# Patient Record
Sex: Female | Born: 1965 | Race: Black or African American | Hispanic: No | Marital: Single | Smoking: Current every day smoker
Health system: Southern US, Community
[De-identification: ages and names within clinical notes are randomized; demographics above are authoritative.]

## PROBLEM LIST (undated history)

## (undated) ENCOUNTER — Emergency Department (HOSPITAL_BASED_OUTPATIENT_CLINIC_OR_DEPARTMENT_OTHER)

## (undated) DIAGNOSIS — N96 Recurrent pregnancy loss: Secondary | ICD-10-CM

## (undated) DIAGNOSIS — E119 Type 2 diabetes mellitus without complications: Secondary | ICD-10-CM

## (undated) DIAGNOSIS — G5600 Carpal tunnel syndrome, unspecified upper limb: Secondary | ICD-10-CM

## (undated) DIAGNOSIS — I10 Essential (primary) hypertension: Secondary | ICD-10-CM

## (undated) DIAGNOSIS — E785 Hyperlipidemia, unspecified: Secondary | ICD-10-CM

## (undated) DIAGNOSIS — R011 Cardiac murmur, unspecified: Secondary | ICD-10-CM

## (undated) DIAGNOSIS — F329 Major depressive disorder, single episode, unspecified: Secondary | ICD-10-CM

## (undated) DIAGNOSIS — J45909 Unspecified asthma, uncomplicated: Secondary | ICD-10-CM

## (undated) DIAGNOSIS — D649 Anemia, unspecified: Secondary | ICD-10-CM

## (undated) DIAGNOSIS — F419 Anxiety disorder, unspecified: Secondary | ICD-10-CM

## (undated) DIAGNOSIS — M1990 Unspecified osteoarthritis, unspecified site: Secondary | ICD-10-CM

## (undated) DIAGNOSIS — Z8639 Personal history of other endocrine, nutritional and metabolic disease: Secondary | ICD-10-CM

## (undated) DIAGNOSIS — M75100 Unspecified rotator cuff tear or rupture of unspecified shoulder, not specified as traumatic: Secondary | ICD-10-CM

## (undated) DIAGNOSIS — F32A Depression, unspecified: Secondary | ICD-10-CM

## (undated) DIAGNOSIS — G8929 Other chronic pain: Secondary | ICD-10-CM

## (undated) DIAGNOSIS — M549 Dorsalgia, unspecified: Secondary | ICD-10-CM

## (undated) HISTORY — DX: Recurrent pregnancy loss: N96

## (undated) HISTORY — DX: Cardiac murmur, unspecified: R01.1

## (undated) HISTORY — DX: Dorsalgia, unspecified: M54.9

## (undated) HISTORY — DX: Hyperlipidemia, unspecified: E78.5

## (undated) HISTORY — DX: Other chronic pain: G89.29

## (undated) HISTORY — DX: Type 2 diabetes mellitus without complications: E11.9

## (undated) HISTORY — PX: CARPAL TUNNEL RELEASE: SHX101

## (undated) HISTORY — DX: Essential (primary) hypertension: I10

## (undated) HISTORY — DX: Unspecified osteoarthritis, unspecified site: M19.90

## (undated) HISTORY — DX: Personal history of other endocrine, nutritional and metabolic disease: Z86.39

## (undated) HISTORY — DX: Depression, unspecified: F32.A

## (undated) HISTORY — PX: REDUCTION MAMMAPLASTY: SUR839

## (undated) HISTORY — DX: Major depressive disorder, single episode, unspecified: F32.9

## (undated) HISTORY — DX: Unspecified rotator cuff tear or rupture of unspecified shoulder, not specified as traumatic: M75.100

## (undated) HISTORY — DX: Anxiety disorder, unspecified: F41.9

## (undated) HISTORY — DX: Anemia, unspecified: D64.9

## (undated) HISTORY — DX: Carpal tunnel syndrome, unspecified upper limb: G56.00

---

## 1998-03-23 ENCOUNTER — Other Ambulatory Visit: Admission: RE | Admit: 1998-03-23 | Discharge: 1998-03-23 | Payer: Self-pay | Admitting: Obstetrics and Gynecology

## 1998-06-10 ENCOUNTER — Other Ambulatory Visit: Admission: RE | Admit: 1998-06-10 | Discharge: 1998-06-10 | Payer: Self-pay | Admitting: *Deleted

## 1998-06-11 HISTORY — PX: COSMETIC SURGERY: SHX468

## 1998-06-11 HISTORY — PX: BREAST SURGERY: SHX581

## 1998-06-11 HISTORY — PX: REDUCTION MAMMAPLASTY: SUR839

## 1999-01-17 ENCOUNTER — Other Ambulatory Visit: Admission: RE | Admit: 1999-01-17 | Discharge: 1999-01-17 | Payer: Self-pay | Admitting: Obstetrics and Gynecology

## 1999-07-11 ENCOUNTER — Other Ambulatory Visit: Admission: RE | Admit: 1999-07-11 | Discharge: 1999-07-11 | Payer: Self-pay | Admitting: Obstetrics and Gynecology

## 1999-08-18 ENCOUNTER — Ambulatory Visit (HOSPITAL_COMMUNITY): Admission: RE | Admit: 1999-08-18 | Discharge: 1999-08-18 | Payer: Self-pay | Admitting: Obstetrics and Gynecology

## 1999-08-18 ENCOUNTER — Encounter (INDEPENDENT_AMBULATORY_CARE_PROVIDER_SITE_OTHER): Payer: Self-pay

## 2000-01-23 ENCOUNTER — Emergency Department (HOSPITAL_COMMUNITY): Admission: EM | Admit: 2000-01-23 | Discharge: 2000-01-23 | Payer: Self-pay | Admitting: Emergency Medicine

## 2000-01-23 ENCOUNTER — Encounter: Payer: Self-pay | Admitting: Emergency Medicine

## 2000-02-28 ENCOUNTER — Other Ambulatory Visit: Admission: RE | Admit: 2000-02-28 | Discharge: 2000-02-28 | Payer: Self-pay | Admitting: Obstetrics and Gynecology

## 2000-02-29 ENCOUNTER — Encounter (INDEPENDENT_AMBULATORY_CARE_PROVIDER_SITE_OTHER): Payer: Self-pay | Admitting: Specialist

## 2000-02-29 ENCOUNTER — Other Ambulatory Visit: Admission: RE | Admit: 2000-02-29 | Discharge: 2000-02-29 | Payer: Self-pay | Admitting: Obstetrics and Gynecology

## 2000-08-26 ENCOUNTER — Other Ambulatory Visit: Admission: RE | Admit: 2000-08-26 | Discharge: 2000-08-26 | Payer: Self-pay | Admitting: *Deleted

## 2000-09-27 ENCOUNTER — Ambulatory Visit (HOSPITAL_COMMUNITY): Admission: RE | Admit: 2000-09-27 | Discharge: 2000-09-27 | Payer: Self-pay | Admitting: Obstetrics and Gynecology

## 2000-09-27 ENCOUNTER — Encounter: Payer: Self-pay | Admitting: Obstetrics and Gynecology

## 2000-10-02 ENCOUNTER — Encounter (INDEPENDENT_AMBULATORY_CARE_PROVIDER_SITE_OTHER): Payer: Self-pay | Admitting: Specialist

## 2000-10-02 ENCOUNTER — Other Ambulatory Visit: Admission: RE | Admit: 2000-10-02 | Discharge: 2000-10-02 | Payer: Self-pay | Admitting: Plastic Surgery

## 2000-12-30 ENCOUNTER — Other Ambulatory Visit: Admission: RE | Admit: 2000-12-30 | Discharge: 2000-12-30 | Payer: Self-pay | Admitting: Obstetrics and Gynecology

## 2001-08-28 ENCOUNTER — Other Ambulatory Visit: Admission: RE | Admit: 2001-08-28 | Discharge: 2001-08-28 | Payer: Self-pay | Admitting: Obstetrics and Gynecology

## 2002-11-11 ENCOUNTER — Other Ambulatory Visit: Admission: RE | Admit: 2002-11-11 | Discharge: 2002-11-11 | Payer: Self-pay | Admitting: Obstetrics and Gynecology

## 2003-06-18 ENCOUNTER — Other Ambulatory Visit: Admission: RE | Admit: 2003-06-18 | Discharge: 2003-06-18 | Payer: Self-pay | Admitting: Obstetrics and Gynecology

## 2003-07-11 ENCOUNTER — Encounter (INDEPENDENT_AMBULATORY_CARE_PROVIDER_SITE_OTHER): Payer: Self-pay | Admitting: Specialist

## 2003-07-11 ENCOUNTER — Ambulatory Visit (HOSPITAL_COMMUNITY): Admission: AD | Admit: 2003-07-11 | Discharge: 2003-07-11 | Payer: Self-pay | Admitting: Obstetrics and Gynecology

## 2003-07-11 ENCOUNTER — Other Ambulatory Visit: Admission: RE | Admit: 2003-07-11 | Discharge: 2003-07-11 | Payer: Self-pay | Admitting: Obstetrics and Gynecology

## 2005-01-26 ENCOUNTER — Inpatient Hospital Stay (HOSPITAL_COMMUNITY): Admission: AD | Admit: 2005-01-26 | Discharge: 2005-01-26 | Payer: Self-pay | Admitting: Obstetrics and Gynecology

## 2005-02-14 ENCOUNTER — Encounter (INDEPENDENT_AMBULATORY_CARE_PROVIDER_SITE_OTHER): Payer: Self-pay | Admitting: Specialist

## 2005-02-14 ENCOUNTER — Ambulatory Visit (HOSPITAL_COMMUNITY): Admission: RE | Admit: 2005-02-14 | Discharge: 2005-02-14 | Payer: Self-pay | Admitting: Obstetrics and Gynecology

## 2005-03-11 ENCOUNTER — Emergency Department (HOSPITAL_COMMUNITY): Admission: EM | Admit: 2005-03-11 | Discharge: 2005-03-11 | Payer: Self-pay | Admitting: Family Medicine

## 2005-07-17 ENCOUNTER — Ambulatory Visit (HOSPITAL_COMMUNITY): Admission: RE | Admit: 2005-07-17 | Discharge: 2005-07-17 | Payer: Self-pay | Admitting: Obstetrics and Gynecology

## 2005-10-17 ENCOUNTER — Emergency Department (HOSPITAL_COMMUNITY): Admission: EM | Admit: 2005-10-17 | Discharge: 2005-10-17 | Payer: Self-pay | Admitting: Family Medicine

## 2005-11-06 ENCOUNTER — Encounter: Admission: RE | Admit: 2005-11-06 | Discharge: 2005-11-06 | Payer: Self-pay | Admitting: Orthopedic Surgery

## 2006-10-25 ENCOUNTER — Ambulatory Visit (HOSPITAL_COMMUNITY): Admission: RE | Admit: 2006-10-25 | Discharge: 2006-10-25 | Payer: Self-pay | Admitting: Obstetrics and Gynecology

## 2006-11-01 ENCOUNTER — Encounter: Admission: RE | Admit: 2006-11-01 | Discharge: 2006-11-01 | Payer: Self-pay | Admitting: Obstetrics and Gynecology

## 2007-01-13 ENCOUNTER — Ambulatory Visit (HOSPITAL_COMMUNITY): Payer: Self-pay | Admitting: Psychiatry

## 2007-02-18 ENCOUNTER — Ambulatory Visit (HOSPITAL_COMMUNITY): Payer: Self-pay | Admitting: Psychiatry

## 2007-03-24 ENCOUNTER — Ambulatory Visit (HOSPITAL_COMMUNITY): Payer: Self-pay | Admitting: Psychiatry

## 2007-06-16 ENCOUNTER — Emergency Department (HOSPITAL_COMMUNITY): Admission: EM | Admit: 2007-06-16 | Discharge: 2007-06-16 | Payer: Self-pay | Admitting: Emergency Medicine

## 2008-05-17 ENCOUNTER — Encounter: Admission: RE | Admit: 2008-05-17 | Discharge: 2008-05-17 | Payer: Self-pay | Admitting: Obstetrics and Gynecology

## 2008-06-15 ENCOUNTER — Emergency Department (HOSPITAL_COMMUNITY): Admission: EM | Admit: 2008-06-15 | Discharge: 2008-06-15 | Payer: Self-pay | Admitting: Family Medicine

## 2009-06-01 ENCOUNTER — Encounter: Admission: RE | Admit: 2009-06-01 | Discharge: 2009-06-01 | Payer: Self-pay | Admitting: Obstetrics and Gynecology

## 2009-06-21 ENCOUNTER — Encounter: Admission: RE | Admit: 2009-06-21 | Discharge: 2009-06-21 | Payer: Self-pay | Admitting: Obstetrics and Gynecology

## 2009-09-27 ENCOUNTER — Emergency Department (HOSPITAL_COMMUNITY): Admission: EM | Admit: 2009-09-27 | Discharge: 2009-09-27 | Payer: Self-pay | Admitting: Family Medicine

## 2010-07-02 ENCOUNTER — Encounter: Payer: Self-pay | Admitting: Orthopedic Surgery

## 2010-07-03 ENCOUNTER — Encounter
Admission: RE | Admit: 2010-07-03 | Discharge: 2010-07-03 | Payer: Self-pay | Source: Home / Self Care | Attending: Obstetrics and Gynecology | Admitting: Obstetrics and Gynecology

## 2010-09-25 LAB — URINE CULTURE: Colony Count: >=100000

## 2010-09-25 LAB — POCT URINALYSIS DIP (DEVICE)
Bilirubin Urine: NEGATIVE
Specific Gravity, Urine: >=1.03 (ref 1.005–1.030)
pH: 5.5 (ref 5.0–8.0)

## 2010-10-27 NOTE — H&P (Signed)
NAME:  Melody Jenkins, Melody Jenkins                         ACCOUNT NO.:  [id number]   MEDICAL RECORD NO.:  [id number]                   PATIENT TYPE:  AMB   LOCATION:  SDC                                  FACILITY:  [facility]   PHYSICIAN:  Maxie Better, M.D.            DATE OF BIRTH:  01/24/1966   DATE OF ADMISSION:  07/12/2003  DATE OF DISCHARGE:                                HISTORY & PHYSICAL   CHIEF COMPLAINT:  1. Missed abortion.  2. Vaginal bleeding.   HISTORY OF PRESENT ILLNESS:  This is a 45 year old, gravida 4, para 1-0-1-1,  black female, last menstrual period April 12, 2003, [facility] of January 18, 2004,  with a diagnosis of a missed abortion, who now presents for surgical  management.  The patient has been having some vaginal bleeding and mild  cramps.  She desires chromosomal analysis.  The patient was diagnosed with a  missed AB on July 01, 2003, after she presented to [facility]  for a genetic evaluation.   ALLERGIES:  No known drug allergies.   MEDICATIONS:  Prenatal vitamins.   PAST MEDICAL HISTORY:  1. Uterine fibroids.  2. Smoker.   PAST SURGICAL HISTORY:  1. D&C x 2.  2. Hysteroscopy.  3. C-section.  4. Exploratory laparotomy in 1987.  5. Bilateral breast reduction in 2002.  6. Facial surgery in 1984 and 1998.   OBSTETRICAL HISTORY:  1. Ectopic pregnancy in June of 1987.  2. SAB in 1990.  3. In November of 1989, C-section of a 9 pound 11 ounce breech.   FAMILY HISTORY:  Noncontributory.   SOCIAL HISTORY:  Involved.  Smoker.  One child.   REVIEW OF SYSTEMS:  Negative.   PHYSICAL EXAMINATION:  GENERAL APPEARANCE:  A well-developed, well-  nourished, tearful, black female.  VITAL SIGNS:  Blood pressure 130/60, afebrile.  SKIN:  No lesions.  HEENT:  Anicteric sclerae.  Pink conjunctivae.  Oropharynx negative.  HEART:  Regular rate and rhythm without murmur.  LUNGS:  Clear to auscultation.  BREASTS:  Status post breast reduction with multiple scars.   No palpable  masses.  ABDOMEN:  Soft and nontender.  Transverse scar.  BACK:  No CVA tenderness.  PELVIC:  The vulva showed no lesions.  The vagina showed no discharge.  The  cervix was long and closed.  The uterus was anteverted, globular, and 9  weeks size.  No palpable mass.   IMPRESSION:  Missed abortion.   PLAN:  Admission.  Suction, dilatation, and evacuation with chromosomal  analysis.  The risks and benefits of the procedure have been explained to  the patient, including, but not limited to infection, bleeding, injury to  surrounding organ structures, uterine perforation and its management, and  retained products of conception and its management.  All questions were  answered.  The patient's blood type is O positive.  Maxie Better, M.D.    /MEDQ  D:  07/11/2003  T:  07/11/2003  Job:  [id number]

## 2010-10-27 NOTE — Op Note (Signed)
NAME:  Melody Jenkins, Melody Jenkins               ACCOUNT NO.:  [id number]   MEDICAL RECORD NO.:  [id number]          PATIENT TYPE:  AMB   LOCATION:  SDC                           [facility]ILITY:  [facility]   PHYSICIAN:  Maxie Better, M.D.DATE OF BIRTH:  [birth year]   DATE OF PROCEDURE:  02/14/2005  DATE OF DISCHARGE:                                 OPERATIVE REPORT   PREOPERATIVE DIAGNOSES:  Elective termination, fibroid uterus.   POSTOPERATIVE DIAGNOSES:  Elective termination, fibroid uterus.   PROCEDURE:  Suction dilation and evacuation.   ANESTHESIA:  MAC paracervical block.   SURGEON:  Maxie Better, M.D.   DESCRIPTION OF PROCEDURE:  Under adequate monitored anesthesia, the patient  was placed in the dorsal lithotomy position, she was sterilely prepped and  draped in the usual fashion. Examination under anesthesia revealed  anteverted 8 week size uterus, slightly irregular, no adnexal masses could  be appreciated. The bladder was catheterized of a moderate amount of  concentrated urine. A bivalve speculum was placed in the vagina, 20 mL of 1%  Nesacaine was injected at 3 and 9 o'clock. The anterior lip of the cervix  was grasped with a single tooth tenaculum. The cervix was then serially  dilated up to #31 [facility] dilator. A #8 mm curved suction cannula was  introduced into the uterine cavity. A large amount of products of conception  was obtained, the cavity was then suctioned and curetted until all tissue  was feltto have been removed at which time all instruments were then removed  from the vagina. Specimen labeled products of conception was sent to  pathology. Estimated blood loss was minimal. Complications none. The patient  tolerated the procedure well and was transferred to the recovery room in  stable condition.      Maxie Better, M.D.  Electronically Signed     [place]/MEDQ  D:  02/14/2005  T:  02/14/2005  Job:  [id number]

## 2010-10-27 NOTE — Op Note (Signed)
NAME:  Melody Jenkins, Melody Jenkins                         ACCOUNT NO.:  [id number]   MEDICAL RECORD NO.:  [id number]                   PATIENT TYPE:  MAT   LOCATION:  MATC                                 [facility]ILITY:  [facility]   PHYSICIAN:  Maxie Better, M.D.            DATE OF BIRTH:  1965/08/22   DATE OF PROCEDURE:  07/11/2003  DATE OF DISCHARGE:                                 OPERATIVE REPORT   PREOPERATIVE DIAGNOSES:  1. Missed abortion.  2. Vaginal bleeding.   PROCEDURE:  Suction dilation and evacuation.   POSTOPERATIVE DIAGNOSES:  1. Missed abortion.  2. Vaginal bleeding.   ANESTHESIA:  MAC, paracervical block.   SURGEON:  Maxie Better, M.D.   INDICATION:  This is a 45 year old, gravida 4, para 1-0-2-1 female with a  known missed abortion who presented with increased vaginal bleeding and  increasing cramps, now for her surgical procedure which had originally been  scheduled for July 12, 2003.  Risks and benefits of the procedure had  been explained to the patient; consent was signed, and the patient was  transferred to the operating room.  The patient had a temperature of 100.5.  She was given __________ prior to transfer to the operating room.   DESCRIPTION OF PROCEDURE:  Under adequate monitored anesthesia, the patient  was placed in the dorsal lithotomy position.  Examination under anesthesia  revealed an anteverted, 10-11 week size uterus.  No adnexal masses could be  appreciated.  The vagina was filled with clotted material.  The patient was  sterilely prepped and draped in the usual fashion.  The bladder was  catheterized but no urine.  A bivalve speculum was placed in the vagina.  Dark, clotted blood was noted which was removed.  Nesacaine 1% 20 mL was  injected paracervically at 3 and 9 o'clock.  The anterior lip of the cervix  was grasped with a single-tooth tenaculum.  The cervix was about 1 cm  dilated.  The cervix easily accepted a #27 [employer] dilator and a #8  suction  cannula was introduced in the uterine cavity.  Large amount of products of  conception was obtained; cavity was suctioned, curetted, suctioned, and  during this time of suction, a piece of tissue of the placenta was obtained  which was sent for karyotype.  The procedure was continued until all tissue  was felt to have been removed from the uterus at which time all instruments  were then removed from the vagina.  Specimen labeled products of conception  and karyotype was sent to pathology.  Estimated blood loss was less than 50  mL.  Complications none.  Maternal blood type was O positive.  The patient  tolerated the procedure well, was transferred to recovery in stable  condition.  Maxie Better, M.D.    [place]/MEDQ  D:  07/11/2003  T:  07/11/2003  Job:  [id number]

## 2010-10-27 NOTE — Op Note (Signed)
[facility]  Patient:    Melody Jenkins, Melody Jenkins                      MRN: [id number] Proc. Date: 08/18/99 Adm. Date:  [id number] Disc. Date: [id number] Attending:  Maxie Better                           Operative Report  PREOPERATIVE DIAGNOSIS:       Menorrhagia, ASCUS Pap smear.  POSTOPERATIVE DIAGNOSIS:      Menorrhagia, ASCUS Pap smear.  OPERATION:                    Dilatation and curettage, diagnostic hysteroscopy.  SURGEON:                      Sheronette A. Cherly Hensen, M.D.  ASSISTANT:  ANESTHESIA:                   General anesthesia.  ESTIMATED BLOOD LOSS:  INDICATIONS:                  This is a 45 year old female, last menstrual period of August 09, 1999, with menorrhagia who had undergone an ultrasound in November of 1999 which did not show a thickening endometrium, and who now presents with complaint of persistent heavy menses with each cycle.  At the time of her ultrasound, the uterus had appeared to be inhomogeneous suggestive of possible fibroids.  The patient now presents for further evaluation.  The risks and benefits of the procedure have been explained.  Consent was signed.  The patient was taken to the operating room.  DESCRIPTION OF PROCEDURE:     Under adequate general anesthesia, the patient was placed in the dorsal lithotomy position.  She was sterilely prepped and draped n the usual fashion.  The bladder was catheterized for a moderate amount of urine. Examination under anesthesia revealed an anteverted uterus about seven weeks size. No adnexal masses palpable.  A bivalve speculum was placed in the vagina.  A single tooth tenaculum was placed on the anterior lip of the cervix.  The endocervical  canal was curetted with a Duncan curet.  The specimen labled endocervical curettings was sent to pathology.  The cervix was then serially dilated up to #25 [facility] dilator and a Sorbitol primed diagnostic hysteroscope  was introduced into the uterine cavity and a video camera was attached to it.  Panoramic view of the uterine cavity was notable for both tubal ostia being seen.  There was an anterior wall thickening of the endometrium, but no identifiable fibroid or polyp.  The endocervical canal was inspected.  No polyps were seen.  The hysteroscope was removed.  The uterine cavity was then curetted for a moderate amount of tissue.  The hysteroscope was reinserted.  The uterine cavity was inspected.  The thickened area had markedly decreased.  The hysteroscope was then removed.  The uterine cavity was once again curetted.  The procedure was then terminated by removing ll instruments.  Specimens were endometrial curettings as well as endocervical curettings sent to pathology.  Estimated blood loss was minimal.  Complications  were none.  Sorbitol deficit was 140 cc.  The patient tolerated the procedure well and was transferred to the recovery room in stable condition. DD:  08/18/99 TD:  08/20/99 Job: [id number]

## 2010-12-23 ENCOUNTER — Inpatient Hospital Stay (HOSPITAL_COMMUNITY): Admission: RE | Admit: 2010-12-23 | Payer: 59 | Source: Ambulatory Visit

## 2013-06-05 ENCOUNTER — Encounter (HOSPITAL_COMMUNITY): Payer: Self-pay | Admitting: Emergency Medicine

## 2013-06-05 ENCOUNTER — Emergency Department (HOSPITAL_COMMUNITY)
Admission: EM | Admit: 2013-06-05 | Discharge: 2013-06-05 | Disposition: A | Discharge status: Home / Self Care | Payer: 59 | Attending: Emergency Medicine | Admitting: Emergency Medicine

## 2013-06-05 DIAGNOSIS — K089 Disorder of teeth and supporting structures, unspecified: Secondary | ICD-10-CM | POA: Insufficient documentation

## 2013-06-05 DIAGNOSIS — J45901 Unspecified asthma with (acute) exacerbation: Secondary | ICD-10-CM | POA: Insufficient documentation

## 2013-06-05 DIAGNOSIS — K0889 Other specified disorders of teeth and supporting structures: Secondary | ICD-10-CM

## 2013-06-05 DIAGNOSIS — Z79899 Other long term (current) drug therapy: Secondary | ICD-10-CM | POA: Insufficient documentation

## 2013-06-05 DIAGNOSIS — F17200 Nicotine dependence, unspecified, uncomplicated: Secondary | ICD-10-CM | POA: Insufficient documentation

## 2013-06-05 HISTORY — DX: Unspecified asthma, uncomplicated: J45.909

## 2013-06-05 MED ORDER — ALBUTEROL SULFATE HFA 108 (90 BASE) MCG/ACT IN AERS
2.0000 | INHALATION_SPRAY | Freq: Once | RESPIRATORY_TRACT | Status: AC
  Administered 2013-06-05: 2 via RESPIRATORY_TRACT
  Filled 2013-06-05: qty 6.7

## 2013-06-05 MED ORDER — OXYCODONE-ACETAMINOPHEN 5-325 MG PO TABS
1.0000 | ORAL_TABLET | Freq: Once | ORAL | Status: AC
  Administered 2013-06-05: 1 via ORAL
  Filled 2013-06-05: qty 1

## 2013-06-05 MED ORDER — ALBUTEROL SULFATE (5 MG/ML) 0.5% IN NEBU
5.0000 mg | INHALATION_SOLUTION | Freq: Once | RESPIRATORY_TRACT | Status: AC
  Administered 2013-06-05: 5 mg via RESPIRATORY_TRACT
  Filled 2013-06-05: qty 1

## 2013-06-05 MED ORDER — KETOROLAC TROMETHAMINE 60 MG/2ML IM SOLN
60.0000 mg | Freq: Once | INTRAMUSCULAR | Status: AC
  Administered 2013-06-05: 60 mg via INTRAMUSCULAR
  Filled 2013-06-05: qty 2

## 2013-06-05 MED ORDER — PENICILLIN V POTASSIUM 500 MG PO TABS: 500.0000 mg | ORAL_TABLET | Freq: Four times a day (QID) | ORAL | Status: AC

## 2013-06-05 MED ORDER — OXYCODONE-ACETAMINOPHEN 5-325 MG PO TABS: 2.0000 | ORAL_TABLET | ORAL | Status: DC | PRN

## 2013-06-05 MED ORDER — LIDOCAINE VISCOUS 2 % MT SOLN
15.0000 mL | Freq: Once | OROMUCOSAL | Status: AC
  Administered 2013-06-05: 15 mL via OROMUCOSAL
  Filled 2013-06-05: qty 15

## 2013-06-05 NOTE — ED Notes (Addendum)
Per pt sts cough, wheezing, SOB and dental pain. sts she has been taking BC powder and ibuprofen for the pain in her mouth.

## 2013-06-05 NOTE — ED Provider Notes (Signed)
CSN: [id number]     Arrival date & time 06/05/13  1611 History  This chart was scribed for non-physician practitioner Irish Elders, NP, working with Audree Camel, MD by Dorothey Baseman, ED Scribe. This patient was seen in room TR07C/TR07C and the patient's care was started at 8:11 PM.    Chief Complaint  Patient presents with  . Dental Pain  . Shortness of Breath   The history is provided by the patient. No language interpreter was used.   HPI Comments: Melody Jenkins is a 47 y.o. female with a history of asthma who presents to the Emergency Department complaining of a constant, dry cough with associated wheezes and shortness of breath onset earlier today. Patient reports using her albuterol inhaler at home without relief. Patient reports that she has run out of her inhaler. She reports that she was seen at an urgent care facility for these complaints and states she was advised to come to the ED. Patient received an albuterol nebulizer and Percocet upon arrival to the ED, which she states has provided some relief. She denies palpitations.   Patient is also complaining of a constant, throbbing pain, 10/10 at its worst, to the right, upper dentition onset a little over 1 week ago. She reports applying BC powders and Orajel directly to the affected tooth and taking Excedrin and 2 bottles of ibuprofen at home without significant relief. She reports an associated episode emesis and epistaxis earlier today secondary to taking too much medication, both of which has since resolved. Patient reports that her dentist is currently out of town, but that she will call to make an appointment next week. She denies any allergies to medications. Patient has no other pertinent medical history.   Past Medical History  Diagnosis Date  . Asthma    History reviewed. No pertinent past surgical history. History reviewed. No pertinent family history. History  Substance Use Topics  . Smoking status: Current Every Day  Smoker  . Smokeless tobacco: Not on file  . Alcohol Use: No   OB History   Grav Para Term Preterm Abortions TAB SAB Ect Mult Living                 Review of Systems  HENT: Positive for dental problem and nosebleeds (resolved).   Respiratory: Positive for cough, shortness of breath and wheezing.   Cardiovascular: Negative for palpitations.  Gastrointestinal: Positive for vomiting (resolved).  All other systems reviewed and are negative.    Allergies  Review of patient's allergies indicates no known allergies.  Home Medications   Current Outpatient Rx  Name  Route  Sig  Dispense  Refill  . albuterol (PROVENTIL HFA;VENTOLIN HFA) 108 (90 BASE) MCG/ACT inhaler   Inhalation   Inhale 2 puffs into the lungs every 6 (six) hours as needed for wheezing or shortness of breath.         . Aspirin-Salicylamide-Caffeine (BC HEADACHE POWDER PO)   Oral   Take 1 packet by mouth every 6 (six) hours as needed (pain).         Marland Kitchen ibuprofen (ADVIL,MOTRIN) 400 MG tablet   Oral   Take 600 mg by mouth every 6 (six) hours as needed for moderate pain.         . vitamin C (ASCORBIC ACID) 500 MG tablet   Oral   Take 500 mg by mouth daily.         . Zinc 30 MG CAPS   Oral   Take  30 mg by mouth daily.          Triage Vitals: BP 154/112  Pulse 80  Temp(Src) 97.3 F (36.3 C)  Resp 18  SpO2 100%  LMP 05/06/2013  Physical Exam  Nursing note and vitals reviewed. Constitutional: She is oriented to person, place, and time. She appears well-developed and well-nourished. No distress.  HENT:  Head: Normocephalic and atraumatic.  Mouth/Throat: Oropharynx is clear and moist.    Cracked dentition. Mild gingival erythema.   Eyes: Conjunctivae are normal.  Neck: Normal range of motion. Neck supple.  Cardiovascular: Normal rate, regular rhythm and normal heart sounds.   Pulmonary/Chest: Effort normal and breath sounds normal. No respiratory distress. She has no wheezes.  Abdominal: She  exhibits no distension.  Musculoskeletal: Normal range of motion.  Lymphadenopathy:    She has no cervical adenopathy.  Neurological: She is alert and oriented to person, place, and time.  Skin: Skin is warm and dry.  Psychiatric: She has a normal mood and affect. Her behavior is normal.    ED Course  Procedures (including critical care time)  DIAGNOSTIC STUDIES: Oxygen Saturation is 100% on room air, normal by my interpretation.    COORDINATION OF CARE: 8:16 PM- Ordered an albuterol nebulizer and Percocet. Will order Toradol and an additional albuterol inhaler. Will discharge patient with Veetid, Percocet, and viscous lidocaine to manage symptoms. Advised patient to take Benadryl at home. Advised patient to follow up with her dentist. Discussed treatment plan with patient at bedside and patient verbalized agreement.     Labs Review Labs Reviewed - No data to display Imaging Review No results found.  EKG Interpretation   None       MDM   1. Pain, dental    Feeling better after Toradol injection here. Nebulizer treatment x 1 here with resolution of symptoms. Wheezing cleared. Afebrile. No difficulty swallowing or neck pain. Albuterol inhaler given to take home. Lidocaine, viscous given as well as, Pen VK and percocet for pain. Instructions given for medications and discussed follow-up with dentist. Resource guide given.   I personally performed the services described in this documentation, which was scribed in my presence. The recorded information has been reviewed and is accurate.     Irish Elders, NP 06/20/13 1511

## 2013-06-23 NOTE — ED Provider Notes (Signed)
Medical screening examination/treatment/procedure(s) were performed by non-physician practitioner and as supervising physician I was immediately available for consultation/collaboration.  EKG Interpretation   None         Cheree Fowles T Amaan Meyer, MD 06/23/13 0053 

## 2014-08-27 ENCOUNTER — Other Ambulatory Visit: Payer: Self-pay

## 2014-08-27 DIAGNOSIS — Z1231 Encounter for screening mammogram for malignant neoplasm of breast: Secondary | ICD-10-CM

## 2014-09-03 ENCOUNTER — Ambulatory Visit
Admission: RE | Admit: 2014-09-03 | Discharge: 2014-09-03 | Disposition: A | Discharge status: Home / Self Care | Payer: 59 | Source: Ambulatory Visit

## 2014-09-03 DIAGNOSIS — Z1231 Encounter for screening mammogram for malignant neoplasm of breast: Secondary | ICD-10-CM

## 2015-03-30 ENCOUNTER — Encounter: Payer: Self-pay | Admitting: Family Medicine

## 2015-03-30 DIAGNOSIS — F419 Anxiety disorder, unspecified: Secondary | ICD-10-CM | POA: Insufficient documentation

## 2015-03-30 DIAGNOSIS — E785 Hyperlipidemia, unspecified: Secondary | ICD-10-CM | POA: Insufficient documentation

## 2015-03-30 DIAGNOSIS — F329 Major depressive disorder, single episode, unspecified: Secondary | ICD-10-CM | POA: Insufficient documentation

## 2015-03-30 DIAGNOSIS — F32A Depression, unspecified: Secondary | ICD-10-CM | POA: Insufficient documentation

## 2015-03-30 DIAGNOSIS — D649 Anemia, unspecified: Secondary | ICD-10-CM | POA: Insufficient documentation

## 2015-03-30 DIAGNOSIS — J45909 Unspecified asthma, uncomplicated: Secondary | ICD-10-CM | POA: Insufficient documentation

## 2015-04-06 ENCOUNTER — Ambulatory Visit (INDEPENDENT_AMBULATORY_CARE_PROVIDER_SITE_OTHER): Payer: 59 | Admitting: Family Medicine

## 2015-04-06 ENCOUNTER — Encounter: Payer: Self-pay | Admitting: Family Medicine

## 2015-04-06 VITALS — BP 134/78 | HR 72 | Temp 98.3°F | Resp 16 | Ht 69.0 in | Wt 232.0 lb

## 2015-04-06 DIAGNOSIS — J4520 Mild intermittent asthma, uncomplicated: Secondary | ICD-10-CM

## 2015-04-06 DIAGNOSIS — Z0000 Encounter for general adult medical examination without abnormal findings: Secondary | ICD-10-CM | POA: Diagnosis not present

## 2015-04-06 DIAGNOSIS — Z113 Encounter for screening for infections with a predominantly sexual mode of transmission: Secondary | ICD-10-CM

## 2015-04-06 DIAGNOSIS — Z124 Encounter for screening for malignant neoplasm of cervix: Secondary | ICD-10-CM

## 2015-04-06 DIAGNOSIS — Z720 Tobacco use: Secondary | ICD-10-CM | POA: Diagnosis not present

## 2015-04-06 DIAGNOSIS — Z1159 Encounter for screening for other viral diseases: Secondary | ICD-10-CM | POA: Diagnosis not present

## 2015-04-06 DIAGNOSIS — E669 Obesity, unspecified: Secondary | ICD-10-CM | POA: Insufficient documentation

## 2015-04-06 DIAGNOSIS — Z8249 Family history of ischemic heart disease and other diseases of the circulatory system: Secondary | ICD-10-CM

## 2015-04-06 DIAGNOSIS — S46911A Strain of unspecified muscle, fascia and tendon at shoulder and upper arm level, right arm, initial encounter: Secondary | ICD-10-CM | POA: Diagnosis not present

## 2015-04-06 DIAGNOSIS — Z23 Encounter for immunization: Secondary | ICD-10-CM

## 2015-04-06 LAB — COMPREHENSIVE METABOLIC PANEL
ALK PHOS: 69 U/L (ref 33–130)
ALT: 11 U/L (ref 6–29)
AST: 14 U/L (ref 10–35)
Albumin: 3.9 g/dL (ref 3.6–5.1)
BUN: 12 mg/dL (ref 7–25)
CALCIUM: 9.4 mg/dL (ref 8.6–10.4)
CO2: 27 mmol/L (ref 20–31)
Chloride: 105 mmol/L (ref 98–110)
Creat: 0.72 mg/dL (ref 0.50–1.05)
Glucose, Bld: 108 mg/dL — ABNORMAL HIGH (ref 70–99)
POTASSIUM: 4.3 mmol/L (ref 3.5–5.3)
Sodium: 139 mmol/L (ref 135–146)
Total Bilirubin: 0.4 mg/dL (ref 0.2–1.2)
Total Protein: 7.2 g/dL (ref 6.1–8.1)

## 2015-04-06 LAB — CBC WITH DIFFERENTIAL/PLATELET
BASOS PCT: 0 % (ref 0–1)
Basophils Absolute: 0 10*3/uL (ref 0.0–0.1)
EOS PCT: 2 % (ref 0–5)
Eosinophils Absolute: 0.1 10*3/uL (ref 0.0–0.7)
HEMATOCRIT: 42.6 % (ref 36.0–46.0)
Hemoglobin: 13.1 g/dL (ref 12.0–15.0)
LYMPHS ABS: 2.4 10*3/uL (ref 0.7–4.0)
Lymphocytes Relative: 36 % (ref 12–46)
MCH: 25.2 pg — ABNORMAL LOW (ref 26.0–34.0)
MCHC: 30.8 g/dL (ref 30.0–36.0)
MCV: 81.9 fL (ref 78.0–100.0)
MONOS PCT: 10 % (ref 3–12)
MPV: 10.2 fL (ref 8.6–12.4)
Monocytes Absolute: 0.7 10*3/uL (ref 0.1–1.0)
Neutro Abs: 3.5 10*3/uL (ref 1.7–7.7)
Neutrophils Relative %: 52 % (ref 43–77)
Platelets: 315 10*3/uL (ref 150–400)
RBC: 5.2 MIL/uL — ABNORMAL HIGH (ref 3.87–5.11)
RDW: 16 % — ABNORMAL HIGH (ref 11.5–15.5)
WBC: 6.8 10*3/uL (ref 4.0–10.5)

## 2015-04-06 LAB — WET PREP FOR TRICH, YEAST, CLUE
Trich, Wet Prep: NONE SEEN
YEAST WET PREP: NONE SEEN

## 2015-04-06 LAB — LIPID PANEL
CHOL/HDL RATIO: 5.6 ratio — AB (ref <=5.0)
Cholesterol: 173 mg/dL (ref 125–200)
HDL: 31 mg/dL — AB (ref >=46)
LDL CALC: 113 mg/dL (ref <130)
Triglycerides: 147 mg/dL (ref <150)
VLDL: 29 mg/dL (ref <30)

## 2015-04-06 MED ORDER — MELOXICAM 7.5 MG PO TABS: 7.5000 mg | ORAL_TABLET | Freq: Every day | ORAL | Status: DC
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
## 2015-04-06 NOTE — Assessment & Plan Note (Signed)
Continue albuterol,. Discussed need for tobacco cessation Pneumonia vaccine 23 given

## 2015-04-06 NOTE — Patient Instructions (Addendum)
Referral for colonoscopy We will call with lab results Take mobic with food Pneumovax 23 given F/U pending results

## 2015-04-07 LAB — TSH: TSH: 0.504 u[IU]/mL (ref 0.350–4.500)

## 2015-04-07 LAB — GC/CHLAMYDIA PROBE AMP
CT Probe RNA: NEGATIVE
GC Probe RNA: NEGATIVE

## 2015-04-07 LAB — PAP THINPREP ASCUS RFLX HPV RFLX TYPE

## 2015-04-07 LAB — HIV ANTIBODY (ROUTINE TESTING W REFLEX): HIV 1&2 Ab, 4th Generation: NONREACTIVE

## 2015-04-07 LAB — RPR

## 2015-04-07 LAB — HEPATITIS C ANTIBODY: HCV Ab: NEGATIVE

## 2015-08-10 ENCOUNTER — Other Ambulatory Visit: Payer: Self-pay

## 2015-08-10 DIAGNOSIS — Z1231 Encounter for screening mammogram for malignant neoplasm of breast: Secondary | ICD-10-CM

## 2015-08-15 ENCOUNTER — Ambulatory Visit: Payer: 59 | Admitting: Family Medicine

## 2015-08-16 ENCOUNTER — Ambulatory Visit (INDEPENDENT_AMBULATORY_CARE_PROVIDER_SITE_OTHER): Payer: 59 | Admitting: Family Medicine

## 2015-08-16 ENCOUNTER — Encounter: Payer: Self-pay | Admitting: Family Medicine

## 2015-08-16 VITALS — BP 138/82 | HR 72 | Temp 98.5°F | Resp 16 | Ht 69.0 in | Wt 226.0 lb

## 2015-08-16 DIAGNOSIS — N12 Tubulo-interstitial nephritis, not specified as acute or chronic: Secondary | ICD-10-CM

## 2015-08-16 DIAGNOSIS — N760 Acute vaginitis: Secondary | ICD-10-CM | POA: Diagnosis not present

## 2015-08-16 DIAGNOSIS — Z1211 Encounter for screening for malignant neoplasm of colon: Secondary | ICD-10-CM

## 2015-08-16 LAB — URINALYSIS, ROUTINE W REFLEX MICROSCOPIC
BILIRUBIN URINE: NEGATIVE
Glucose, UA: NEGATIVE
Leukocytes, UA: NEGATIVE
Nitrite: POSITIVE — AB
PH: 5.5 (ref 5.0–8.0)
Protein, ur: NEGATIVE

## 2015-08-16 LAB — URINALYSIS, MICROSCOPIC ONLY
CASTS: NONE SEEN [LPF]
CRYSTALS: NONE SEEN [HPF]
Yeast: NONE SEEN [HPF]

## 2015-08-16 LAB — WET PREP FOR TRICH, YEAST, CLUE
Trich, Wet Prep: NONE SEEN
YEAST WET PREP: NONE SEEN

## 2015-08-16 MED ORDER — CIPROFLOXACIN HCL 500 MG PO TABS: 500.0000 mg | ORAL_TABLET | Freq: Two times a day (BID) | ORAL | Status: DC

## 2015-08-16 MED ORDER — FLUCONAZOLE 150 MG PO TABS: 150.0000 mg | ORAL_TABLET | Freq: Once | ORAL | Status: DC

## 2015-08-16 MED ORDER — HYDROCODONE-ACETAMINOPHEN 5-325 MG PO TABS: 1.0000 | ORAL_TABLET | Freq: Four times a day (QID) | ORAL | Status: DC | PRN

## 2015-08-16 NOTE — Progress Notes (Signed)
Patient ID: ALEXXYS WALSTROM, female   DOB: 1965-06-20, 50 y.o.   MRN: [id number]   Subjective:    Patient ID: CLORINE MOHABIR, female    DOB: [birth year], 50 y.o.   MRN: [id number]  Patient presents for Urinary Frequency and Vaginitis  patient here with right flank pain and urinary pressure lower abdominal pain that started the past few days. She states that occasionally she gets flank pain on and off. She is noted that she has a lot of urgency when going to the restroom and at times she will dribble. She has not had any fever. She also notes some mild vaginal discharge. She's not had any abnormal vaginal bleeding. No fever no nausea vomiting no change in bowels    Review Of Systems:  GEN- denies fatigue, fever, weight loss,weakness, recent illness HEENT- denies eye drainage, change in vision, nasal discharge, CVS- denies chest pain, palpitations RESP- denies SOB, cough, wheeze ABD- denies N/V, change in stools, abd pain GU- denies dysuria, hematuria, dribbling, incontinence MSK- denies joint pain, muscle aches, injury Neuro- denies headache, dizziness, syncope, seizure activity       Objective:    BP 138/82 mmHg  Pulse 72  Temp(Src) 98.5 F (36.9 C) (Oral)  Resp 16  Ht 5\' 9"  (1.753 m)  Wt 226 lb (102.513 kg)  BMI 33.36 kg/m2  LMP 08/08/2015 GEN- NAD, alert and oriented x3 HEENT- PERRL, EOMI, non injected sclera, pink conjunctiva, MMM, oropharynx clear CVS- RRR, no murmur RESP-CTAB ABD-NABS,soft,TTP suprapubic region, no rebound, no guarding,ND, +CVA tenderness Right side          Assessment & Plan:      Problem List Items Addressed This Visit    None    Visit Diagnoses    Pyelonephritis    -  Primary     concern for mild pyelonephritis I think this can be treated outpatient. I will put her on ciprofloxacin of also give her hydrocodone she will return for repeat UA in a few weeks    Relevant Orders    Urinalysis, Routine w reflex microscopic (not at [facility]) (Completed)     Urine culture    Vaginitis and vulvovaginitis         no vaginitis seen on wet prep    Relevant Orders    WET PREP FOR [place], YEAST, CLUE (Completed)    Colon cancer screening        Relevant Orders    Ambulatory referral to Gastroenterology       Note: This dictation was prepared with Dragon dictation along with smaller phrase technology. Any transcriptional errors that result from this process are unintentional.

## 2015-08-16 NOTE — Patient Instructions (Addendum)
Cancel Physical for March-  Take antibiotics as prescribed  Difllucan given  Referral to GI for colonoscopy  F/U Lab to leave urine sample 4 weeks

## 2015-08-17 ENCOUNTER — Encounter: Payer: Self-pay | Admitting: Family Medicine

## 2015-08-19 ENCOUNTER — Emergency Department (HOSPITAL_COMMUNITY)
Admission: EM | Admit: 2015-08-19 | Discharge: 2015-08-20 | Disposition: A | Discharge status: Home / Self Care | Payer: 59 | Attending: Emergency Medicine | Admitting: Emergency Medicine

## 2015-08-19 ENCOUNTER — Encounter (HOSPITAL_COMMUNITY): Payer: Self-pay | Admitting: Emergency Medicine

## 2015-08-19 DIAGNOSIS — R1031 Right lower quadrant pain: Secondary | ICD-10-CM | POA: Diagnosis present

## 2015-08-19 DIAGNOSIS — R112 Nausea with vomiting, unspecified: Secondary | ICD-10-CM | POA: Insufficient documentation

## 2015-08-19 DIAGNOSIS — F17210 Nicotine dependence, cigarettes, uncomplicated: Secondary | ICD-10-CM | POA: Insufficient documentation

## 2015-08-19 DIAGNOSIS — J45909 Unspecified asthma, uncomplicated: Secondary | ICD-10-CM | POA: Diagnosis not present

## 2015-08-19 LAB — URINE MICROSCOPIC-ADD ON

## 2015-08-19 LAB — CBC
HCT: 38.2 % (ref 36.0–46.0)
Hemoglobin: 11.6 g/dL — ABNORMAL LOW (ref 12.0–15.0)
MCH: 24 pg — AB (ref 26.0–34.0)
MCHC: 30.4 g/dL (ref 30.0–36.0)
MCV: 78.9 fL (ref 78.0–100.0)
PLATELETS: 296 10*3/uL (ref 150–400)
RBC: 4.84 MIL/uL (ref 3.87–5.11)
RDW: 16.3 % — AB (ref 11.5–15.5)
WBC: 6.7 10*3/uL (ref 4.0–10.5)

## 2015-08-19 LAB — URINALYSIS, ROUTINE W REFLEX MICROSCOPIC
BILIRUBIN URINE: NEGATIVE
GLUCOSE, UA: NEGATIVE mg/dL
Ketones, ur: NEGATIVE mg/dL
Leukocytes, UA: NEGATIVE
Nitrite: NEGATIVE
PROTEIN: NEGATIVE mg/dL
Specific Gravity, Urine: 1.025 (ref 1.005–1.030)
pH: 5 (ref 5.0–8.0)

## 2015-08-19 LAB — COMPREHENSIVE METABOLIC PANEL
ALBUMIN: 3.5 g/dL (ref 3.5–5.0)
ALK PHOS: 64 U/L (ref 38–126)
ALT: 14 U/L (ref 14–54)
AST: 17 U/L (ref 15–41)
Anion gap: 10 (ref 5–15)
BUN: 12 mg/dL (ref 6–20)
CALCIUM: 9.1 mg/dL (ref 8.9–10.3)
CHLORIDE: 107 mmol/L (ref 101–111)
CO2: 23 mmol/L (ref 22–32)
CREATININE: 0.8 mg/dL (ref 0.44–1.00)
GFR calc non Af Amer: >60 mL/min (ref >60)
GLUCOSE: 117 mg/dL — AB (ref 65–99)
Potassium: 3.8 mmol/L (ref 3.5–5.1)
SODIUM: 140 mmol/L (ref 135–145)
Total Bilirubin: 0.2 mg/dL — ABNORMAL LOW (ref 0.3–1.2)
Total Protein: 7 g/dL (ref 6.5–8.1)

## 2015-08-19 LAB — URINE CULTURE: Colony Count: >=100000

## 2015-08-19 NOTE — ED Notes (Signed)
Pt. reports RLQ pain onset this morning with nausea and emesis , denies fever  or diarrhea , currently taking oral antibiotic diagnosed with UTI 2 days ago .

## 2015-08-19 NOTE — ED Notes (Signed)
Pt got upset over wait time and stated that she didn't understand why she was still waiting when she was here before people that had already gone back. I apologized and stated "Im sorry I wasn't up here when you checked in and I go by the time and unfortunately don't have control over the time". She said "Don't say that to me when I don't feel good". I apologized again and told her that there was 1 more patient in front of her to go back. The name of the pt in front of her got called to go back then the pt and her husband got up and left.

## 2015-09-02 ENCOUNTER — Encounter: Payer: 59 | Admitting: Family Medicine

## 2015-09-11 ENCOUNTER — Other Ambulatory Visit: Payer: Self-pay | Admitting: Family Medicine

## 2015-09-12 ENCOUNTER — Ambulatory Visit: Payer: 59

## 2015-09-12 NOTE — Telephone Encounter (Signed)
Refill appropriate and filled per protocol. 

## 2015-11-03 ENCOUNTER — Ambulatory Visit: Payer: 59

## 2015-12-02 ENCOUNTER — Encounter: Payer: Self-pay | Admitting: Family Medicine

## 2015-12-07 ENCOUNTER — Emergency Department (HOSPITAL_COMMUNITY)
Admission: EM | Admit: 2015-12-07 | Discharge: 2015-12-07 | Disposition: A | Discharge status: Home / Self Care | Payer: 59 | Attending: Emergency Medicine | Admitting: Emergency Medicine

## 2015-12-07 ENCOUNTER — Emergency Department (HOSPITAL_COMMUNITY): Payer: 59

## 2015-12-07 ENCOUNTER — Encounter (HOSPITAL_COMMUNITY): Payer: Self-pay

## 2015-12-07 DIAGNOSIS — J45909 Unspecified asthma, uncomplicated: Secondary | ICD-10-CM | POA: Insufficient documentation

## 2015-12-07 DIAGNOSIS — Y939 Activity, unspecified: Secondary | ICD-10-CM | POA: Insufficient documentation

## 2015-12-07 DIAGNOSIS — F17210 Nicotine dependence, cigarettes, uncomplicated: Secondary | ICD-10-CM | POA: Diagnosis not present

## 2015-12-07 DIAGNOSIS — Y92512 Supermarket, store or market as the place of occurrence of the external cause: Secondary | ICD-10-CM | POA: Insufficient documentation

## 2015-12-07 DIAGNOSIS — T148XXA Other injury of unspecified body region, initial encounter: Secondary | ICD-10-CM

## 2015-12-07 DIAGNOSIS — W19XXXA Unspecified fall, initial encounter: Secondary | ICD-10-CM

## 2015-12-07 DIAGNOSIS — W010XXA Fall on same level from slipping, tripping and stumbling without subsequent striking against object, initial encounter: Secondary | ICD-10-CM | POA: Insufficient documentation

## 2015-12-07 DIAGNOSIS — M13862 Other specified arthritis, left knee: Secondary | ICD-10-CM | POA: Insufficient documentation

## 2015-12-07 DIAGNOSIS — M62830 Muscle spasm of back: Secondary | ICD-10-CM

## 2015-12-07 DIAGNOSIS — M545 Low back pain, unspecified: Secondary | ICD-10-CM

## 2015-12-07 DIAGNOSIS — Y999 Unspecified external cause status: Secondary | ICD-10-CM | POA: Insufficient documentation

## 2015-12-07 DIAGNOSIS — S8992XA Unspecified injury of left lower leg, initial encounter: Secondary | ICD-10-CM | POA: Diagnosis present

## 2015-12-07 DIAGNOSIS — S8392XA Sprain of unspecified site of left knee, initial encounter: Secondary | ICD-10-CM | POA: Diagnosis not present

## 2015-12-07 DIAGNOSIS — I10 Essential (primary) hypertension: Secondary | ICD-10-CM

## 2015-12-07 DIAGNOSIS — M1712 Unilateral primary osteoarthritis, left knee: Secondary | ICD-10-CM

## 2015-12-07 MED ORDER — HYDROCODONE-ACETAMINOPHEN 5-325 MG PO TABS: 1.0000 | ORAL_TABLET | Freq: Four times a day (QID) | ORAL | Status: DC | PRN

## 2015-12-07 MED ORDER — NAPROXEN 500 MG PO TABS: 500.0000 mg | ORAL_TABLET | Freq: Two times a day (BID) | ORAL | Status: DC | PRN

## 2015-12-07 MED ORDER — HYDROCODONE-ACETAMINOPHEN 5-325 MG PO TABS
1.0000 | ORAL_TABLET | Freq: Once | ORAL | Status: AC
  Administered 2015-12-07: 1 via ORAL
  Filled 2015-12-07: qty 1

## 2015-12-07 NOTE — ED Notes (Signed)
Mercedes, PA at bedside at this time.

## 2015-12-07 NOTE — ED Provider Notes (Signed)
CSN: [id number]     Arrival date & time 12/07/15  1817 History  By signing my name below, I, Melody Jenkins, attest that this documentation has been prepared under the direction and in the presence of Bing Duffey Camprubi-Soms, PA-C. Electronically Signed: [name], ED Scribe. 12/07/2015. 8:13 PM.     No chief complaint on file.  Patient is a 50 y.o. female presenting with knee pain. The history is provided by the patient and medical records. No language interpreter was used.  Knee Pain Location:  Knee Time since incident:  2 hours Injury: no   Knee location:  L knee Pain details:    Quality:  Throbbing   Radiates to:  L leg   Severity:  Moderate   Onset quality:  Sudden   Duration:  2 hours   Timing:  Constant   Progression:  Unchanged Chronicity:  New Dislocation: no   Prior injury to area:  No Relieved by:  None tried Worsened by:  Bearing weight and activity Ineffective treatments:  None tried Associated symptoms: back pain, decreased ROM (due to pain), stiffness and swelling   Associated symptoms: no muscle weakness, no numbness and no tingling     HPI Comments: Melody Jenkins is a 50 y.o. female who presents to the Emergency Department complaining of a mechanical fall that occurred 2 hours ago. She reports that she slipped on a wet surface and landed on her right side with her left leg folding behind her. She reports associated 7/10, intermittent, throbbing, left knee pain which radiates through her left leg and worsens with knee bending and ambulation. She additionally reports associated mild left knee swelling, limited ROM due to pain, as well as moderate right lower back/torso pain and right shoulder/trapezius pain. Her back and shoulder pain worsen with movement. Pt denies having taken anything for pain management. Pt has no known drug allergies. She denies LOC, head trauma, bruising, abrasions, weakness, numbness, tingling, bowel or bladder incontinence, saddle anesthesia  or cauda equina symptoms, CP, SOB, abd pain and n/v, or any other symptoms. Of note, chart review reveals she has had elevated BPs at all prior visits despite not having a documented dx of HTN. States that she's been told the bottom number is high in the past, but hasn't ever taken meds for it.    Past Medical History  Diagnosis Date  . Anemia   . Anxiety   . Asthma   . Depression   . Hyperlipidemia   . History of multiple miscarriages    Past Surgical History  Procedure Laterality Date  . Cosmetic surgery  2000    breast reduction  . Breast surgery  2000    reduction   Family History  Problem Relation Age of Onset  . Diabetes Mother   . Hypertension Mother   . Hypertension Father   . Heart disease Father   . Early death Brother   . Kidney disease Maternal Grandmother   . Heart disease Brother    Social History  Substance Use Topics  . Smoking status: Current Every Day Smoker -- 0.00 packs/day    Types: Cigarettes  . Smokeless tobacco: Never Used  . Alcohol Use: 2.4 oz/week    4 Glasses of wine per week   OB History    No data available     Review of Systems  HENT: Negative for facial swelling (no head inj).   Respiratory: Negative for shortness of breath.   Cardiovascular: Negative for chest pain.  Gastrointestinal:  Negative for nausea, vomiting and abdominal pain.  Genitourinary: Negative for difficulty urinating (no incontinence).  Musculoskeletal: Positive for myalgias (entire R side of body), back pain, joint swelling, arthralgias (L knee) and stiffness.  Skin: Negative for color change and wound.  Allergic/Immunologic: Negative for immunocompromised state.  Neurological: Negative for syncope, weakness and numbness.  Psychiatric/Behavioral: Negative for confusion.  10 Systems reviewed and are negative for acute change except as noted in the HPI.   Allergies  Review of patient's allergies indicates no known allergies.  Home Medications   Prior to  Admission medications   Medication Sig Start Date End Date Taking? Authorizing Provider  albuterol (PROVENTIL HFA;VENTOLIN HFA) 108 (90 BASE) MCG/ACT inhaler Inhale 2 puffs into the lungs every 6 (six) hours as needed for wheezing or shortness of breath.    Historical Provider, MD  ciprofloxacin (CIPRO) 500 MG tablet Take 1 tablet (500 mg total) by mouth 2 (two) times daily. 08/16/15   Alycia Rossetti, MD  fluconazole (DIFLUCAN) 150 MG tablet Take 1 tablet (150 mg total) by mouth once. 08/16/15   Alycia Rossetti, MD  HYDROcodone-acetaminophen (NORCO) 5-325 MG tablet Take 1 tablet by mouth every 6 (six) hours as needed for moderate pain. 08/16/15   Alycia Rossetti, MD  ibuprofen (ADVIL,MOTRIN) 400 MG tablet Take 600 mg by mouth every 6 (six) hours as needed for moderate pain.    Historical Provider, MD  meloxicam (MOBIC) 7.5 MG tablet TAKE 1 TABLET EVERY DAY 09/12/15   Alycia Rossetti, MD  vitamin C (ASCORBIC ACID) 500 MG tablet Take 500 mg by mouth daily.    Historical Provider, MD  Vitamin D, Cholecalciferol, 1000 UNITS CAPS Take by mouth.    Historical Provider, MD   BP 189/116 mmHg  Pulse 73  Temp(Src) 97.8 F (36.6 C) (Oral)  Resp 18  Wt 225 lb (102.059 kg)  SpO2 100%    Physical Exam  Constitutional: She is oriented to person, place, and time. Vital signs are normal. She appears well-developed and well-nourished.  Non-toxic appearance. No distress.  Afebrile, nontoxic, NAD, with elevated BP noted which is similar to prior visits  HENT:  Head: Normocephalic and atraumatic.  Mouth/Throat: Mucous membranes are normal.  [place]/AT, no scalp tenderness or crepitus  Eyes: Conjunctivae and EOM are normal. Right eye exhibits no discharge. Left eye exhibits no discharge.  Neck: Normal range of motion. Neck supple. Muscular tenderness present. No spinous process tenderness present. No rigidity. Normal range of motion present.  FROM intact without spinous process TTP, no bony stepoffs or deformities,  with mild right sided trapezius and paraspinous muscle TTP and muscle spasms. No rigidity or meningeal signs. No bruising or swelling.   Cardiovascular: Normal rate and intact distal pulses.   Pulmonary/Chest: Effort normal. No respiratory distress. She exhibits no tenderness, no crepitus, no deformity and no retraction.  No chest wall TTP or bruising   Abdominal: Soft. Normal appearance. She exhibits no distension. There is no tenderness. There is no rigidity, no rebound and no guarding.  Soft, NTND, no r/g/r, no bruising  Musculoskeletal:       Left knee: She exhibits decreased range of motion (due to pain) and swelling. She exhibits no ecchymosis, no deformity, no laceration, no erythema, normal alignment, no LCL laxity, normal patellar mobility and no MCL laxity. Tenderness found. Medial joint line and lateral joint line tenderness noted.  Diffuse right sided paraspinous muscle TTP and spasms throughout all spinal levels without focal midline bony TTP or  step offs, no pelvic instability or hip joint line TTP. No bruising or abrasions to the back or right torso.  Left knee with limited ROM due to pain, with moderate joint line TTP,  +swelling/effusion, no deformity, no bruising or erythema, no warmth, no abnormal alignment or patellar mobility, no varus/valgus laxity, neg anterior drawer test, no crepitus. No other extremity tenderness or evidence of trauma. Right shoulder with FROM intact and no focal bony or joint line TTP.  Strength and sensation grossly intact, distal pulses intact, compartments soft  Neurological: She is alert and oriented to person, place, and time. She has normal strength. No sensory deficit. Gait normal. GCS eye subscore is 4. GCS verbal subscore is 5. GCS motor subscore is 6.  Skin: Skin is warm, dry and intact. No abrasion, no bruising and no rash noted.  No bruising or abrasions, no seatbelt sign  Psychiatric: She has a normal mood and affect. Her behavior is normal.   Nursing note and vitals reviewed.   ED Course  Procedures  DIAGNOSTIC STUDIES: Oxygen Saturation is 100% on RA, normal by my interpretation.    COORDINATION OF CARE: 7:41 PM Discussed next steps with pt. Pt verbalized understanding and is agreeable with the plan.   Labs Review Labs Reviewed - No data to display  Imaging Review Dg Knee Complete 4 Views Left  12/07/2015  CLINICAL DATA:  50 year old female with fall and left knee injury. EXAM: LEFT KNEE - COMPLETE 4+ VIEW COMPARISON:  None. FINDINGS: There is no acute fracture or dislocation. The bones are mildly osteopenic. There is mild osteoarthritic changes with narrowing of the medial and lateral compartments. No significant joint effusion. The soft tissues appear unremarkable. IMPRESSION: No acute fracture or dislocation. Electronically Signed   By: Anner Crete M.D.   On: 12/07/2015 19:03   I have personally reviewed and evaluated these images as part of my medical decision-making.   EKG Interpretation None      MDM   Final diagnoses:  Fall, initial encounter  Left knee sprain, initial encounter  Arthritis of left knee  HTN (hypertension), benign  Contusion  Right-sided low back pain without sciatica  Back muscle spasm    50 y.o. female here with mechanical fall after slipping on water in a store, twisted L knee and fell on R torso. Extremities NVI with soft compartments, L knee swelling and tenderness in joint line. No other focal bony TTP, although diffuse paraspinous muscle TTP on the R side in all spinal levels, no midline spinal TTP or cauda equina symptoms. Xray of knee showing mild OA, no acute injury. Likely sprain. Doubt need for imaging of other areas, likely just contusion/strain. RICE discussed, naprosyn/norco for pain, f/up with ortho in 1-2wks for recheck of knee pain, and f/up with PCP in 1wk for recheck. Also noted to have HTN but this has been noted before, asymptomatic today, will have her f/up with  PCP. I explained the diagnosis and have given explicit precautions to return to the ER including for any other new or worsening symptoms. The patient understands and accepts the medical plan as it's been dictated and I have answered their questions. Discharge instructions concerning home care and prescriptions have been given. The patient is STABLE and is discharged to home in good condition.   I personally performed the services described in this documentation, which was scribed in my presence. The recorded information has been reviewed and is accurate.  BP 189/97 mmHg  Pulse 63  Temp(Src) 97.8  F (36.6 C) (Oral)  Resp 20  Wt 102.059 kg  SpO2 99%  Meds ordered this encounter  Medications  . HYDROcodone-acetaminophen (NORCO/VICODIN) 5-325 MG per tablet 1 tablet    Sig:   . naproxen (NAPROSYN) 500 MG tablet    Sig: Take 1 tablet (500 mg total) by mouth 2 (two) times daily as needed for mild pain, moderate pain or headache (TAKE WITH MEALS.).    Dispense:  20 tablet    Refill:  0    Order Specific Question:  Supervising Provider    Answer:  MILLER, BRIAN [[id number]]  . HYDROcodone-acetaminophen (NORCO) 5-325 MG tablet    Sig: Take 1 tablet by mouth every 6 (six) hours as needed for severe pain.    Dispense:  10 tablet    Refill:  0    Order Specific Question:  Supervising Provider    Answer:  Noemi Chapel [[id number]]      Atasha Colebank Camprubi-Soms, PA-C 12/07/15 [id number]  Blanchie Dessert, MD 12/09/15 [id number]

## 2015-12-07 NOTE — Discharge Instructions (Signed)
Wear knee sleeve for at least 2 weeks for stabilization of knee. Use crutches as needed for comfort. Ice and elevate knee throughout the day, using ice pack for no more than 20 minutes every hour. Use heat to the other areas of soreness, no more than 20 minutes per hour. Alternate between naprosyn and norco for pain relief. Do not drive or operate machinery with pain medication use. Call orthopedic follow up today or tomorrow to schedule followup appointment for recheck of ongoing knee pain in 1-2 weeks that can be canceled with a 24-48 hour notice if complete resolution of pain. Follow up with your regular doctor in 1 week for ongoing management of your blood pressure, and to recheck symptoms. Return to the ER for changes or worsening symptoms.    Musculoskeletal Pain Musculoskeletal pain is muscle and boney aches and pains. These pains can occur in any part of the body. Your caregiver may treat you without knowing the cause of the pain. They may treat you if blood or urine tests, X-rays, and other tests were normal.  CAUSES There is often not a definite cause or reason for these pains. These pains may be caused by a type of germ (virus). The discomfort may also come from overuse. Overuse includes working out too hard when your body is not fit. Boney aches also come from weather changes. Bone is sensitive to atmospheric pressure changes. HOME CARE INSTRUCTIONS   Ask when your test results will be ready. Make sure you get your test results.  Only take over-the-counter or prescription medicines for pain, discomfort, or fever as directed by your caregiver. If you were given medications for your condition, do not drive, operate machinery or power tools, or sign legal documents for 24 hours. Do not drink alcohol. Do not take sleeping pills or other medications that may interfere with treatment.  Continue all activities unless the activities cause more pain. When the pain lessens, slowly resume normal  activities. Gradually increase the intensity and duration of the activities or exercise.  During periods of severe pain, bed rest may be helpful. Lay or sit in any position that is comfortable.  Putting ice on the injured area.  Put ice in a bag.  Place a towel between your skin and the bag.  Leave the ice on for 15 to 20 minutes, 3 to 4 times a day.  Follow up with your caregiver for continued problems and no reason can be found for the pain. If the pain becomes worse or does not go away, it may be necessary to repeat tests or do additional testing. Your caregiver may need to look further for a possible cause. SEEK IMMEDIATE MEDICAL CARE IF:  You have pain that is getting worse and is not relieved by medications.  You develop chest pain that is associated with shortness or breath, sweating, feeling sick to your stomach (nauseous), or throw up (vomit).  Your pain becomes localized to the abdomen.  You develop any new symptoms that seem different or that concern you. MAKE SURE YOU:   Understand these instructions.  Will watch your condition.  Will get help right away if you are not doing well or get worse.   This information is not intended to replace advice given to you by your health care provider. Make sure you discuss any questions you have with your health care provider.   Document Released: 05/28/2005 Document Revised: 08/20/2011 Document Reviewed: 01/30/2013 Elsevier Interactive Patient Education 2016 [employer]  Muscle Cramps  and Spasms Muscle cramps and spasms are when muscles tighten by themselves. They usually get better within minutes. Muscle cramps are painful. They are usually stronger and last longer than muscle spasms. Muscle spasms may or may not be painful. They can last a few seconds or much longer. HOME CARE  Drink enough fluid to keep your pee (urine) clear or pale yellow.  Massage, stretch, and relax the muscle.  Use a warm towel, heating pad, or  warm shower water on tight muscles.  Place ice on the muscle if it is tender or in pain.  Put ice in a plastic bag.  Place a towel between your skin and the bag.  Leave the ice on for 15-20 minutes, 03-04 times a day.  Only take medicine as told by your doctor. GET HELP RIGHT AWAY IF:  Your cramps or spasms get worse, happen more often, or do not get better with time. MAKE SURE YOU:  Understand these instructions.  Will watch your condition.  Will get help right away if you are not doing well or get worse.   This information is not intended to replace advice given to you by your health care provider. Make sure you discuss any questions you have with your health care provider.   Document Released: 05/10/2008 Document Revised: 09/22/2012 Document Reviewed: 05/14/2012 Elsevier Interactive Patient Education 2016 Else[employer] Inc.  Knee Sprain A knee sprain is a tear in the strong bands of tissue that connect the bones (ligaments) of your knee. HOME CARE  Raise (elevate) your injured knee to lessen puffiness (swelling).  To ease pain and puffiness, put ice on the injured area.  Put ice in a plastic bag.  Place a towel between your skin and the bag.  Leave the ice on for 20 minutes, 2-3 times a day.  Only take medicine as told by your doctor.  Do not leave your knee unprotected until pain and stiffness go away (usually 4-6 weeks).  If you have a cast or splint, do not get it wet. If your doctor told you to not take it off, cover it with a plastic bag when you shower or bathe. Do not swim.  Your doctor may have you do exercises to prevent or limit permanent weakness and stiffness. GET HELP RIGHT AWAY IF:   Your cast or splint becomes damaged.  Your pain gets worse.  You have a lot of pain, puffiness, or numbness below the cast or splint. MAKE SURE YOU:   Understand these instructions.  Will watch your condition.  Will get help right away if you are not doing well or  get worse.   This information is not intended to replace advice given to you by your health care provider. Make sure you discuss any questions you have with your health care provider.   Document Released: 05/16/2009 Document Revised: 06/02/2013 Document Reviewed: 02/03/2013 Elsevier Interactive Patient Education 2016 [place] therapy can help ease sore, stiff, injured, and tight muscles and joints. Heat relaxes your muscles, which may help ease your pain. Heat therapy should only be used on old, pre-existing, or long-lasting (chronic) injuries. Do not use heat therapy unless told by your doctor. HOW TO USE HEAT THERAPY There are several different kinds of heat therapy, including:  Moist heat pack.  Warm water bath.  Hot water bottle.  Electric heating pad.  Heated gel pack.  Heated wrap.  Electric heating pad. GENERAL HEAT THERAPY RECOMMENDATIONS   Do not sleep while  using heat therapy. Only use heat therapy while you are awake.  Your skin may turn pink while using heat therapy. Do not use heat therapy if your skin turns red.  Do not use heat therapy if you have new pain.  High heat or long exposure to heat can cause burns. Be careful when using heat therapy to avoid burning your skin.  Do not use heat therapy on areas of your skin that are already irritated, such as with a rash or sunburn. GET HELP IF:   You have blisters, redness, swelling (puffiness), or numbness.  You have new pain.  Your pain is worse. MAKE SURE YOU:  Understand these instructions.  Will watch your condition.  Will get help right away if you are not doing well or get worse.   This information is not intended to replace advice given to you by your health care provider. Make sure you discuss any questions you have with your health care provider.   Document Released: 08/20/2011 Document Revised: 06/18/2014 Document Reviewed: 07/21/2013 Elsevier Interactive Patient  Education 2016 [place].  Cryotherapy Cryotherapy means treatment with cold. Ice or gel packs can be used to reduce both pain and swelling. Ice is the most helpful within the first 24 to 48 hours after an injury or flare-up from overusing a muscle or joint. Sprains, strains, spasms, burning pain, shooting pain, and aches can all be eased with ice. Ice can also be used when recovering from surgery. Ice is effective, has very few side effects, and is safe for most people to use. PRECAUTIONS  Ice is not a safe treatment option for people with:  Raynaud phenomenon. This is a condition affecting small blood vessels in the extremities. Exposure to cold may cause your problems to return.  Cold hypersensitivity. There are many forms of cold hypersensitivity, including:  Cold urticaria. Red, itchy hives appear on the skin when the tissues begin to warm after being iced.  Cold erythema. This is a red, itchy rash caused by exposure to cold.  Cold hemoglobinuria. Red blood cells break down when the tissues begin to warm after being iced. The hemoglobin that carry oxygen are passed into the urine because they cannot combine with blood proteins fast enough.  Numbness or altered sensitivity in the area being iced. If you have any of the following conditions, do not use ice until you have discussed cryotherapy with your caregiver:  Heart conditions, such as arrhythmia, angina, or chronic heart disease.  High blood pressure.  Healing wounds or open skin in the area being iced.  Current infections.  Rheumatoid arthritis.  Poor circulation.  Diabetes. Ice slows the blood flow in the region it is applied. This is beneficial when trying to stop inflamed tissues from spreading irritating chemicals to surrounding tissues. However, if you expose your skin to cold temperatures for too long or without the proper protection, you can damage your skin or nerves. Watch for signs of skin damage due to  cold. HOME CARE INSTRUCTIONS Follow these tips to use ice and cold packs safely.  Place a dry or damp towel between the ice and skin. A damp towel will cool the skin more quickly, so you may need to shorten the time that the ice is used.  For a more rapid response, add gentle compression to the ice.  Ice for no more than 10 to 20 minutes at a time. The bonier the area you are icing, the less time it will take to get the  benefits of ice.  Check your skin after 5 minutes to make sure there are no signs of a poor response to cold or skin damage.  Rest 20 minutes or more between uses.  Once your skin is numb, you can end your treatment. You can test numbness by very lightly touching your skin. The touch should be so light that you do not see the skin dimple from the pressure of your fingertip. When using ice, most people will feel these normal sensations in this order: cold, burning, aching, and numbness.  Do not use ice on someone who cannot communicate their responses to pain, such as small children or people with dementia. HOW TO MAKE AN ICE PACK Ice packs are the most common way to use ice therapy. Other methods include ice massage, ice baths, and cryosprays. Muscle creams that cause a cold, tingly feeling do not offer the same benefits that ice offers and should not be used as a substitute unless recommended by your caregiver. To make an ice pack, do one of the following:  Place crushed ice or a bag of frozen vegetables in a sealable plastic bag. Squeeze out the excess air. Place this bag inside another plastic bag. Slide the bag into a pillowcase or place a damp towel between your skin and the bag.  Mix 3 parts water with 1 part rubbing alcohol. Freeze the mixture in a sealable plastic bag. When you remove the mixture from the freezer, it will be slushy. Squeeze out the excess air. Place this bag inside another plastic bag. Slide the bag into a pillowcase or place a damp towel between your  skin and the bag. SEEK MEDICAL CARE IF:  You develop white spots on your skin. This may give the skin a blotchy (mottled) appearance.  Your skin turns blue or pale.  Your skin becomes waxy or hard.  Your swelling gets worse. MAKE SURE YOU:   Understand these instructions.  Will watch your condition.  Will get help right away if you are not doing well or get worse.   This information is not intended to replace advice given to you by your health care provider. Make sure you discuss any questions you have with your health care provider.   Document Released: 01/22/2011 Document Revised: 06/18/2014 Document Reviewed: 01/22/2011 Elsevier Interactive Patient Education 2016 [employer].  Hypertension Hypertension is another name for high blood pressure. High blood pressure forces your heart to work harder to pump blood. A blood pressure reading has two numbers, which includes a higher number over a lower number (example: 110/72). HOME CARE   Have your blood pressure rechecked by your doctor.  Only take medicine as told by your doctor. Follow the directions carefully. The medicine does not work as well if you skip doses. Skipping doses also puts you at risk for problems.  Do not smoke.  Monitor your blood pressure at home as told by your doctor. GET HELP IF:  You think you are having a reaction to the medicine you are taking.  You have repeat headaches or feel dizzy.  You have puffiness (swelling) in your ankles.  You have trouble with your vision. GET HELP RIGHT AWAY IF:   You get a very bad headache and are confused.  You feel weak, numb, or faint.  You get chest or belly (abdominal) pain.  You throw up (vomit).  You cannot breathe very well. MAKE SURE YOU:   Understand these instructions.  Will watch your condition.  Will  get help right away if you are not doing well or get worse.   This information is not intended to replace advice given to you by your  health care provider. Make sure you discuss any questions you have with your health care provider.   Document Released: 11/14/2007 Document Revised: 06/02/2013 Document Reviewed: 03/20/2013 Elsevier Interactive Patient Education 2016 [place] Your High Blood Pressure Blood pressure is a measurement of how forceful your blood is pressing against the walls of the arteries. Arteries are muscular tubes within the circulatory system. Blood pressure does not stay the same. Blood pressure rises when you are active, excited, or nervous; and it lowers during sleep and relaxation. If the numbers measuring your blood pressure stay above normal most of the time, you are at risk for health problems. High blood pressure (hypertension) is a long-term (chronic) condition in which blood pressure is elevated. A blood pressure reading is recorded as two numbers, such as 120 over 80 (or 120/80). The first, higher number is called the systolic pressure. It is a measure of the pressure in your arteries as the heart beats. The second, lower number is called the diastolic pressure. It is a measure of the pressure in your arteries as the heart relaxes between beats.  Keeping your blood pressure in a normal range is important to your overall health and prevention of health problems, such as heart disease and stroke. When your blood pressure is uncontrolled, your heart has to work harder than normal. High blood pressure is a very common condition in adults because blood pressure tends to rise with age. Men and women are equally likely to have hypertension but at different times in life. Before age 59, men are more likely to have hypertension. After 51 years of age, women are more likely to have it. Hypertension is especially common in African Americans. This condition often has no signs or symptoms. The cause of the condition is usually not known. Your caregiver can help you come up with a plan to keep your blood  pressure in a normal, healthy range. BLOOD PRESSURE STAGES Blood pressure is classified into four stages: normal, prehypertension, stage 1, and stage 2. Your blood pressure reading will be used to determine what type of treatment, if any, is necessary. Appropriate treatment options are tied to these four stages:  Normal  Systolic pressure (mm Hg): below 120.  Diastolic pressure (mm Hg): below 80. Prehypertension  Systolic pressure (mm Hg): 120 to 139.  Diastolic pressure (mm Hg): 80 to 89. Stage1  Systolic pressure (mm Hg): 140 to 159.  Diastolic pressure (mm Hg): 90 to 99. Stage2  Systolic pressure (mm Hg): 160 or above.  Diastolic pressure (mm Hg): 100 or above. RISKS RELATED TO HIGH BLOOD PRESSURE Managing your blood pressure is an important responsibility. Uncontrolled high blood pressure can lead to:  A heart attack.  A stroke.  A weakened blood vessel (aneurysm).  Heart failure.  Kidney damage.  Eye damage.  Metabolic syndrome.  Memory and concentration problems. HOW TO MANAGE YOUR BLOOD PRESSURE Blood pressure can be managed effectively with lifestyle changes and medicines (if needed). Your caregiver will help you come up with a plan to bring your blood pressure within a normal range. Your plan should include the following: Education  Read all information provided by your caregivers about how to control blood pressure.  Educate yourself on the latest guidelines and treatment recommendations. New research is always being done to further define the risks  and treatments for high blood pressure. Lifestylechanges  Control your weight.  Avoid smoking.  Stay physically active.  Reduce the amount of salt in your diet.  Reduce stress.  Control any chronic conditions, such as high cholesterol or diabetes.  Reduce your alcohol intake. Medicines  Several medicines (antihypertensive medicines) are available, if needed, to bring blood pressure within a  normal range. Communication  Review all the medicines you take with your caregiver because there may be side effects or interactions.  Talk with your caregiver about your diet, exercise habits, and other lifestyle factors that may be contributing to high blood pressure.  See your caregiver regularly. Your caregiver can help you create and adjust your plan for managing high blood pressure. RECOMMENDATIONS FOR TREATMENT AND FOLLOW-UP  The following recommendations are based on current guidelines for managing high blood pressure in nonpregnant adults. Use these recommendations to identify the proper follow-up period or treatment option based on your blood pressure reading. You can discuss these options with your caregiver.  Systolic pressure of 123456 to [id number] or diastolic pressure of 80 to 89: Follow up with your caregiver as directed.  Systolic pressure of [id number] to 0000000 or diastolic pressure of 90 to 100: Follow up with your caregiver within 2 months.  Systolic pressure above 0000000 or diastolic pressure above [id number]: Follow up with your caregiver within 1 month.  Systolic pressure above 99991111 or diastolic pressure above [id number]: Consider antihypertensive therapy; follow up with your caregiver within 1 week.  Systolic pressure above [id number] or diastolic pressure above 123456: Begin antihypertensive therapy; follow up with your caregiver within 1 week.   This information is not intended to replace advice given to you by your health care provider. Make sure you discuss any questions you have with your health care provider.   Document Released: 02/20/2012 Document Reviewed: 02/20/2012 Elsevier Interactive Patient Education [employer].

## 2015-12-07 NOTE — ED Notes (Signed)
Patient fell while in store this afternoon and her left leg rolled under her. Complains of left knee, right shoulder, and buttock pain. No loc

## 2015-12-12 ENCOUNTER — Encounter: Payer: Self-pay | Admitting: Family Medicine

## 2015-12-12 ENCOUNTER — Ambulatory Visit
Admission: RE | Admit: 2015-12-12 | Discharge: 2015-12-12 | Disposition: A | Discharge status: Home / Self Care | Payer: 59 | Source: Ambulatory Visit | Attending: Family Medicine | Admitting: Family Medicine

## 2015-12-12 ENCOUNTER — Ambulatory Visit (INDEPENDENT_AMBULATORY_CARE_PROVIDER_SITE_OTHER): Payer: 59 | Admitting: Family Medicine

## 2015-12-12 VITALS — BP 168/100 | HR 84 | Temp 98.2°F | Resp 16 | Ht 69.0 in | Wt 220.0 lb

## 2015-12-12 DIAGNOSIS — I10 Essential (primary) hypertension: Secondary | ICD-10-CM | POA: Diagnosis not present

## 2015-12-12 DIAGNOSIS — M545 Low back pain, unspecified: Secondary | ICD-10-CM

## 2015-12-12 DIAGNOSIS — S8992XD Unspecified injury of left lower leg, subsequent encounter: Secondary | ICD-10-CM

## 2015-12-12 LAB — CBC WITH DIFFERENTIAL/PLATELET
BASOS ABS: 0 {cells}/uL (ref 0–200)
Basophils Relative: 0 %
EOS ABS: 140 {cells}/uL (ref 15–500)
EOS PCT: 2 %
HCT: 46.4 % — ABNORMAL HIGH (ref 35.0–45.0)
Hemoglobin: 14.6 g/dL (ref 12.0–15.0)
LYMPHS PCT: 42 %
Lymphs Abs: 2940 cells/uL (ref 850–3900)
MCH: 26.4 pg — AB (ref 27.0–33.0)
MCHC: 31.5 g/dL — AB (ref 32.0–36.0)
MCV: 83.9 fL (ref 80.0–100.0)
MONOS PCT: 9 %
MPV: 9.6 fL (ref 7.5–12.5)
Monocytes Absolute: 630 cells/uL (ref 200–950)
NEUTROS PCT: 47 %
Neutro Abs: 3290 cells/uL (ref 1500–7800)
PLATELETS: 249 10*3/uL (ref 140–400)
RBC: 5.53 MIL/uL — ABNORMAL HIGH (ref 3.80–5.10)
RDW: 16.6 % — ABNORMAL HIGH (ref 11.0–15.0)
WBC: 7 10*3/uL (ref 3.8–10.8)

## 2015-12-12 LAB — BASIC METABOLIC PANEL
BUN: 16 mg/dL (ref 7–25)
CALCIUM: 9.7 mg/dL (ref 8.6–10.4)
CO2: 23 mmol/L (ref 20–31)
CREATININE: 0.7 mg/dL (ref 0.50–1.05)
Chloride: 107 mmol/L (ref 98–110)
GLUCOSE: 110 mg/dL — AB (ref 70–99)
Potassium: 4.5 mmol/L (ref 3.5–5.3)
Sodium: 140 mmol/L (ref 135–146)

## 2015-12-12 MED ORDER — HYDROCHLOROTHIAZIDE 25 MG PO TABS: 25.0000 mg | ORAL_TABLET | Freq: Every day | ORAL | Status: DC

## 2015-12-12 MED ORDER — CYCLOBENZAPRINE HCL 10 MG PO TABS: 10.0000 mg | ORAL_TABLET | Freq: Three times a day (TID) | ORAL | Status: DC | PRN

## 2015-12-12 MED ORDER — HYDROCODONE-ACETAMINOPHEN 7.5-325 MG PO TABS: 1.0000 | ORAL_TABLET | Freq: Four times a day (QID) | ORAL | Status: DC | PRN

## 2015-12-12 NOTE — Progress Notes (Signed)
Patient ID: Melody Jenkins, female   DOB: [birth year], 50 y.o.   MRN: [id number]    Subjective:    Patient ID: Melody Jenkins, female    DOB: 1965-10-19, 50 y.o.   MRN: [id number]  Patient presents for ER F/U and HTN Patient for follow-up she was seen in the emergency room after she slipped and fell on some water only bruising found x-rays were negative. She was given hydrocodone for pain. He was also noted that her blood pressure was significantly elevated in the emergency room.She's been checking her blood pressure at home and is still been quite elevated at 170s to 180s over 90s. She is very overwhelming gets more anxious when her blood pressure goes up and states that she is in extreme pain. She has pain throughout her lower back and upper back her arms she feels like she's been hit by a truck. Her knees also still given significant pain though some of the swelling has gone down. She has appointment to see orthopedics next week. When she fell she actually fell right onto her back after slipping on some water coming from an ice machine at the [employer]. Her left knee hyperflexed backwards. She is out of her pain medication she shows taking Naprosyn as prescribed    Review Of Systems:  GEN- denies fatigue, fever, weight loss,weakness, recent illness HEENT- denies eye drainage, change in vision, nasal discharge, CVS- denies chest pain, palpitations RESP- denies SOB, cough, wheeze ABD- denies N/V, change in stools, abd pain GU- denies dysuria, hematuria, dribbling, incontinence MSK-+ joint pain, +muscle aches, injury Neuro- denies headache, dizziness, syncope, seizure activity       Objective:    BP 168/100 mmHg  Pulse 84  Temp(Src) 98.2 F (36.8 C) (Oral)  Resp 16  Ht 5\' 9"  (1.753 m)  Wt 220 lb (99.791 kg)  BMI 32.47 kg/m2 GEN- NAD, alert and oriented x3 , repeat 170/98 HEENT- PERRL, EOMI, non injected sclera, pink conjunctiva, MMM, oropharynx clear Neck- Supple, FROM CVS- RRR, no  murmur RESP-CTAB MSK- TTP diffusely on back, +spasm, fair ROM, decreased ROM Left knee,mild swelling  EXT- pedal left edema, no calf tenderness  Pulses- Radial, DP- 2+        Assessment & Plan:      Problem List Items Addressed This Visit    None    Visit Diagnoses    Essential hypertension    -  Primary    Pressure significantly elevated though I think most of  this is pain but for now to start her on hydrochlorothiazide once a day check in 3 weeks    Relevant Medications    hydrochlorothiazide (HYDRODIURIL) 25 MG tablet    Other Relevant Orders    Basic metabolic panel    CBC with Differential/Platelet    Knee injuries, left, subsequent encounter        F/U orthopedics, concern for ligamental injury based on mechanism, continue NSAIDS, refilled norco    Acute bilateral low back pain without sciatica        xray obtain r/o acute fracture, has DDD, facet arthritis , muscle relaxer given    Relevant Orders    DG Lumbar Spine Complete (Completed)       Note: This dictation was prepared with Dragon dictation along with smaller phrase technology. Any transcriptional errors that result from this process are unintentional.

## 2015-12-12 NOTE — Patient Instructions (Signed)
Take muscle relaxer Take pain medication as needed [employer] work note - out this week, return on Monday  Take blood pressure medication in the morning We will call with lab results F/U 2 weeks

## 2015-12-16 ENCOUNTER — Telehealth: Payer: Self-pay | Admitting: *Deleted

## 2015-12-16 NOTE — Telephone Encounter (Signed)
Received fax for FMLA forms.   Call placed to patient for more information.   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   Forms routed to provider.

## 2015-12-20 ENCOUNTER — Ambulatory Visit: Payer: 59

## 2015-12-20 ENCOUNTER — Telehealth: Payer: Self-pay | Admitting: Family Medicine

## 2015-12-20 NOTE — Telephone Encounter (Signed)
Call placed to patient and patient made aware per VM.  
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
## 2015-12-20 NOTE — Telephone Encounter (Signed)
Call placed to patient.   Advised that is she continued to have difficulty, OV will be needed via VM.

## 2015-12-20 NOTE — Telephone Encounter (Signed)
Patient is calling to speak to you regarding the sprain she has in her knee and working 8 hours is hard, she had a tough time yesterday at work  And also she cannot take the pain meds at work please call her at [phone]

## 2015-12-21 NOTE — Telephone Encounter (Signed)
Received completed FMLA from provider.   Simple form fee charge per provider. Patient contacted to pay fee.   Routed to front office staff to collect fee.

## 2015-12-23 ENCOUNTER — Telehealth: Payer: Self-pay | Admitting: Family Medicine

## 2015-12-23 NOTE — Telephone Encounter (Signed)
Patient calling to speak to you regarding her fmla papers  [phone]

## 2015-12-23 NOTE — Telephone Encounter (Signed)
Okay to fax forms without paying fee right now

## 2015-12-23 NOTE — Telephone Encounter (Signed)
Please see prior message.   

## 2015-12-23 NOTE — Telephone Encounter (Signed)
Received call from patient.   Reports that she is unable to pay fee for FMLA at this time. States that she will be able to pay for FMLA on Monday, 12/26/2015, when she comes in for appointment.   Reports that FMLA is time sensitive, and she will not be paid for the time she was out until completed forms are received from MD. Requested to have office allow her to pay for forms on Monday, but fax forms today.   MD please advise.

## 2015-12-23 NOTE — Telephone Encounter (Signed)
Patient had brother pay for FMLA.   FMLA faxed to [facility].

## 2015-12-26 ENCOUNTER — Encounter: Payer: Self-pay | Admitting: Family Medicine

## 2015-12-26 ENCOUNTER — Ambulatory Visit (INDEPENDENT_AMBULATORY_CARE_PROVIDER_SITE_OTHER): Payer: 59 | Admitting: Family Medicine

## 2015-12-26 VITALS — BP 136/80 | HR 92 | Temp 98.0°F | Resp 14 | Ht 69.0 in | Wt 216.0 lb

## 2015-12-26 DIAGNOSIS — J302 Other seasonal allergic rhinitis: Secondary | ICD-10-CM

## 2015-12-26 DIAGNOSIS — M5136 Other intervertebral disc degeneration, lumbar region: Secondary | ICD-10-CM | POA: Diagnosis not present

## 2015-12-26 DIAGNOSIS — I10 Essential (primary) hypertension: Secondary | ICD-10-CM

## 2015-12-26 NOTE — Assessment & Plan Note (Signed)
Much improved, continue HCTZ for now

## 2015-12-26 NOTE — Assessment & Plan Note (Signed)
Back pain slowly improving, continue muscle relaxers  Advised DDD not caused by accident/fall but flared up  Continue ROM  Will also see ortho today with regards to knee pain and what they advise with work restrictions

## 2015-12-26 NOTE — Patient Instructions (Signed)
Continue the HCTZ once a day  Flonase twice a day and zyrtec Nasal saline in between  F/U 8 weeks

## 2015-12-26 NOTE — Progress Notes (Signed)
Patient ID: Melody Jenkins, female   DOB: 05/21/1966, 50 y.o.   MRN: [id number]   Subjective:    Patient ID: Melody Jenkins, female    DOB: 10-Apr-1966, 50 y.o.   MRN: [id number]  Patient presents for F/U Issue here for interim follow-up. She had a fall a little over 2 weeks ago at a local grocery store. She injury to her knee as well as low back pain. X-ray was done which does not show any acute fracture has degenerative disc disease and arthritis. Her blood pressure was also noted to be elevated she was started on hydrochlorothiazide 25 mg once a day. Continues to have lower back pain and and off Has appt with ortho this evening Take NSAIDS during day   She has been working which she states makes her back and knee worse after work  Review Of Systems:  GEN- denies fatigue, fever, weight loss,weakness, recent illness HEENT- denies eye drainage, change in vision, nasal discharge, CVS- denies chest pain, palpitations RESP- denies SOB, cough, wheeze ABD- denies N/V, change in stools, abd pain GU- denies dysuria, hematuria, dribbling, incontinence MSK- + joint pain, muscle aches, injury Neuro- denies headache, dizziness, syncope, seizure activity       Objective:    BP 136/80 mmHg  Pulse 92  Temp(Src) 98 F (36.7 C) (Oral)  Resp 14  Ht 5\' 9"  (1.753 m)  Wt 216 lb (97.977 kg)  BMI 31.88 kg/m2 GEN- NAD, alert and oriented x3 HEENT- PERRL, EOMI, non injected sclera, pink conjunctiva, MMM, oropharynx clear Neck- Supple, no thyromegaly CVS- RRR, no murmur RESP-CTAB MSK- Left knee in brace, mild TTP lumbar spine, good ROM, mild spasm EXT- No edema Pulses- Radial, DP- 2+        Assessment & Plan:      Problem List Items Addressed This Visit    Seasonal allergies   Essential hypertension, benign - Primary    Much improved, continue HCTZ for now       DDD (degenerative disc disease), lumbar    Back pain slowly improving, continue muscle relaxers  Advised DDD not caused by  accident/fall but flared up  Continue ROM  Will also see ortho today with regards to knee pain and what they advise with work restrictions         Note: This dictation was prepared with Diplomatic Services operational officer dictation along with smaller Company secretary. Any transcriptional errors that result from this process are unintentional.

## 2016-01-03 ENCOUNTER — Telehealth: Payer: Self-pay | Admitting: Family Medicine

## 2016-01-03 NOTE — Telephone Encounter (Signed)
Patient calling to speak to you about her fmla ppw and some of the pages not being sent  [phone]

## 2016-01-04 ENCOUNTER — Telehealth: Payer: Self-pay | Admitting: *Deleted

## 2016-01-04 NOTE — Telephone Encounter (Signed)
Call placed to patient. LMTRC.  

## 2016-01-04 NOTE — Telephone Encounter (Signed)
Patient returned call.   Reports that Bebe Liter only received page #5 of FMLA forms.   Re-faxed forms.

## 2016-01-04 NOTE — Telephone Encounter (Signed)
Make sure she does not take any other NSAIDS with this, her chart has ibuprofen and naprosyn as well

## 2016-01-04 NOTE — Telephone Encounter (Signed)
Received call from patient.   Reports that the medication she was placed on from ortho is Mobic 15mg .   MD to be made aware.

## 2016-01-04 NOTE — Telephone Encounter (Signed)
noted 

## 2016-01-04 NOTE — Telephone Encounter (Signed)
Call placed to patient and patient made aware.  

## 2016-01-10 ENCOUNTER — Ambulatory Visit
Admission: RE | Admit: 2016-01-10 | Discharge: 2016-01-10 | Disposition: A | Discharge status: Home / Self Care | Payer: 59 | Source: Ambulatory Visit

## 2016-01-10 DIAGNOSIS — Z1231 Encounter for screening mammogram for malignant neoplasm of breast: Secondary | ICD-10-CM

## 2016-01-17 ENCOUNTER — Other Ambulatory Visit: Payer: Self-pay | Admitting: Orthopedic Surgery

## 2016-01-17 DIAGNOSIS — M25562 Pain in left knee: Secondary | ICD-10-CM

## 2016-01-21 ENCOUNTER — Ambulatory Visit
Admission: RE | Admit: 2016-01-21 | Discharge: 2016-01-21 | Disposition: A | Discharge status: Home / Self Care | Payer: 59 | Source: Ambulatory Visit | Attending: Orthopedic Surgery | Admitting: Orthopedic Surgery

## 2016-01-21 DIAGNOSIS — M25562 Pain in left knee: Secondary | ICD-10-CM

## 2016-01-26 ENCOUNTER — Telehealth: Payer: Self-pay | Admitting: Family Medicine

## 2016-01-26 NOTE — Telephone Encounter (Signed)
Call placed to patient. LMTRC.  

## 2016-01-26 NOTE — Telephone Encounter (Signed)
Has questions about FMLA forms and test results.  Feels she needs earlier appt then on scheduled for 02/21/16.  Please call her.

## 2016-02-01 NOTE — Telephone Encounter (Signed)
Multiple calls placed to patient with no answer and no return call.   Message to be closed.  

## 2016-02-02 ENCOUNTER — Other Ambulatory Visit: Payer: Self-pay | Admitting: Family Medicine

## 2016-02-02 NOTE — Telephone Encounter (Signed)
Ok to refill 

## 2016-02-03 NOTE — Telephone Encounter (Signed)
Okay to refill? 

## 2016-02-06 ENCOUNTER — Ambulatory Visit (INDEPENDENT_AMBULATORY_CARE_PROVIDER_SITE_OTHER): Payer: 59 | Admitting: Physician Assistant

## 2016-02-06 ENCOUNTER — Encounter: Payer: Self-pay | Admitting: Physician Assistant

## 2016-02-06 VITALS — BP 142/82 | HR 104 | Temp 98.0°F | Resp 16 | Wt 211.0 lb

## 2016-02-06 DIAGNOSIS — M5441 Lumbago with sciatica, right side: Secondary | ICD-10-CM | POA: Diagnosis not present

## 2016-02-06 DIAGNOSIS — F411 Generalized anxiety disorder: Secondary | ICD-10-CM | POA: Diagnosis not present

## 2016-02-06 DIAGNOSIS — M5442 Lumbago with sciatica, left side: Secondary | ICD-10-CM

## 2016-02-06 MED ORDER — CLONAZEPAM 0.5 MG PO TABS: 0.5000 mg | ORAL_TABLET | Freq: Two times a day (BID) | ORAL | 1 refills | Status: DC | PRN

## 2016-02-06 MED ORDER — PREDNISONE 20 MG PO TABS: ORAL_TABLET | ORAL | 0 refills | Status: DC

## 2016-02-06 NOTE — Progress Notes (Signed)
Patient ID: Melody Jenkins MRN: [id number], DOB: Mar 27, 1966, 50 y.o. Date of Encounter: @DATE @  Chief Complaint:  Chief Complaint  Patient presents with  . office visit    lower back pain/calf pain severe/h/o ACL / GAD 7  SCORE 21    HPI: 50 y.o. year old AA female  presents with above.   I reviewed Dr. Dorian Heckle office note from 12/26/2015. Patient reports that after she had a fall-- she went to the emergency room and since then she has had follow-up with Dr. Buelah Manis and Dr. Marlou Sa. States that Dr. Marlou Sa said that they would need to fix her knee first then address her low back. She comes in today because she is having spasms in bilateral calves. Says these spasms just started 2-3 days ago. She states that she works from home sitting at [employer]. Says that anytime she stands up in the last couple of days when she stands she has significant spasms in her calves bilaterally. No incontinence of bowel or bladder.  She says that Dr. Marlou Sa did an MRI of her knee that showed MCL tear and she was told that this would heal itself without surgery. Says she was told to wear this sleeve on her knee which she is wearing, elevate the leg and apply ice when needed and to follow-up with him in 2 weeks-- she thinks that appointment September 18.  She currently is taking the Flexeril 10 mg 3 times daily. She is taking meloxicam every morning. She is currently out of hydrocodone and is taking no other additional medicines for her back/knee.  In addition to these pain issues above she is also having significant stress/anxiety. She states that her fianc is now in the ICU at [facility] that on Friday he went for evaluation of sore throat and was told rapid strep test was negative and that it was virus. Says he then had to follow-up because he was continuing to feel like he could not swallow. Says that they took him to do CT of his neck and he "flat lined on the way back from the CT "--since  then has been in the ICU. Says that he had no chronic medical problems and took no medications prior to this and this is happened all of a sudden. 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 Offered to write her out of work if needed but she says that this is not necessary. Says that she already has some FMLA because of her recent fall.   Past Medical History:  Diagnosis Date  . Anemia   . Anxiety   . Asthma   . Depression   . History of multiple miscarriages   . Hyperlipidemia      Home Meds: Outpatient Medications Prior to Visit  Medication Sig Dispense Refill  . cyclobenzaprine (FLEXERIL) 10 MG tablet TAKE 1 TABLET BY MOUTH 3 TIMES A DAY AS NEEDED FOR MUSCLE SPASMS 45 tablet 1  . ibuprofen (ADVIL,MOTRIN) 400 MG tablet Take 600 mg by mouth every 6 (six) hours as needed for moderate pain.    . meloxicam (MOBIC) 7.5 MG tablet TAKE 1 TABLET EVERY DAY 30 tablet 3  . vitamin C (ASCORBIC ACID) 500 MG tablet Take 500 mg by mouth daily.    . Vitamin D, Cholecalciferol, 1000 UNITS CAPS Take by mouth.    Marland Kitchen albuterol (PROVENTIL HFA;VENTOLIN HFA) 108 (90 BASE) MCG/ACT inhaler Inhale 2  puffs into the lungs every 6 (six) hours as needed for wheezing or shortness of breath.    . cetirizine (ZYRTEC) 10 MG tablet Take 10 mg by mouth daily.    . hydrochlorothiazide (HYDRODIURIL) 25 MG tablet Take 1 tablet (25 mg total) by mouth daily. 30 tablet 3  . HYDROcodone-acetaminophen (NORCO) 7.5-325 MG tablet Take 1 tablet by mouth every 6 (six) hours as needed for moderate pain. (Patient not taking: Reported on 02/06/2016) 45 tablet 0  . naproxen (NAPROSYN) 500 MG tablet Take 1 tablet (500 mg total) by mouth 2 (two) times daily as needed for mild pain, moderate pain or headache (TAKE WITH MEALS.). (Patient not taking: Reported on 02/06/2016) 20 tablet 0   No facility-administered medications prior to visit.     Allergies: No Known Allergies  Social  History   Social History  . Marital status: Single    Spouse name: N/A  . Number of children: N/A  . Years of education: N/A   Occupational History  . Not on file.   Social History Main Topics  . Smoking status: Current Every Day Smoker    Packs/day: 0.00    Types: Cigarettes  . Smokeless tobacco: Never Used  . Alcohol use 2.4 oz/week    4 Glasses of wine per week  . Drug use: No  . Sexual activity: Yes    Birth control/ protection: None   Other Topics Concern  . Not on file   Social History Narrative  . No narrative on file    Family History  Problem Relation Age of Onset  . Diabetes Mother   . Hypertension Mother   . Hypertension Father   . Heart disease Father   . Early death Brother   . Kidney disease Maternal Grandmother   . Heart disease Brother      Review of Systems:  See HPI for pertinent ROS. All other ROS negative.    Physical Exam: Blood pressure (!) 142/82, pulse (!) 104, temperature 98 F (36.7 C), temperature source Oral, resp. rate 16, weight 211 lb (95.7 kg), last menstrual period 02/02/2016., Body mass index is 31.16 kg/m. General: WNWD AAF. Appears in no acute distress. Neck: Supple. No thyromegaly. No lymphadenopathy. Lungs: Clear bilaterally to auscultation without wheezes, rales, or rhonchi. Breathing is unlabored. Heart: RRR with S1 S2. No murmurs, rubs, or gallops. Musculoskeletal:  Strength and tone normal for age. Every time she goes from sitting to standing, she has severe pain and cramping in bilateral calves. Says pain is going down the back of the legs. When she goes from sitting to standing, she cannot stand up straight---she bends forward, c/o cramping and spasms in calves.  Extremities/Skin: Warm and dry. Neuro: Alert and oriented X 3. Moves all extremities spontaneously. Gait is normal. CNII-XII grossly in tact. Psych:  Responds to questions appropriately with a normal affect.     ASSESSMENT AND PLAN:  50 y.o. year old  female with  1. Bilateral low back pain with sciatica, sciatica laterality unspecified She has had x-ray of lumbar spine and I reviewed that report. Given current symptoms, Will proceed with getting MRI.  She is to take this prednisone taper. She is to continue taking Flexeril 10 mg 3 times daily. If symptoms worsen follow-up immediately. Otherwise will follow-up when we get MRI results and also I'm having her schedule follow-up appointment with Dr. Buelah Manis in 1 week. - predniSONE (DELTASONE) 20 MG tablet; Take 3 daily for 2 days, then 2 daily for 2  days, then 1 daily for 2 days.  Dispense: 12 tablet; Refill: 0 - MR Lumbar Spine Wo Contrast; Future  2. Anxiety state She is to use the clonazepam 1 each night before going to sleep and can also use it as needed during the day. If this is ineffective for her, she is to let me know and we can try different treatment. - clonazePAM (KLONOPIN) 0.5 MG tablet; Take 1 tablet (0.5 mg total) by mouth 2 (two) times daily as needed for anxiety.  Dispense: 30 tablet; Refill: 1  As well, she is to schedule follow-up visit with Dr. Buelah Manis one week.  Marin Olp [place], [place], [facility] 02/06/2016 4:42 PM

## 2016-02-14 ENCOUNTER — Ambulatory Visit (INDEPENDENT_AMBULATORY_CARE_PROVIDER_SITE_OTHER): Payer: 59 | Admitting: Family Medicine

## 2016-02-14 ENCOUNTER — Encounter: Payer: Self-pay | Admitting: Family Medicine

## 2016-02-14 DIAGNOSIS — F4321 Adjustment disorder with depressed mood: Secondary | ICD-10-CM

## 2016-02-14 DIAGNOSIS — M5136 Other intervertebral disc degeneration, lumbar region: Secondary | ICD-10-CM | POA: Diagnosis not present

## 2016-02-14 DIAGNOSIS — M549 Dorsalgia, unspecified: Secondary | ICD-10-CM | POA: Insufficient documentation

## 2016-02-14 MED ORDER — HYDROCODONE-ACETAMINOPHEN 7.5-325 MG PO TABS: 1.0000 | ORAL_TABLET | Freq: Four times a day (QID) | ORAL | 0 refills | Status: DC | PRN

## 2016-02-14 MED ORDER — METHOCARBAMOL 500 MG PO TABS
500.0000 mg | ORAL_TABLET | Freq: Four times a day (QID) | ORAL | 1 refills | Status: DC | PRN
Start: 2016-02-14 — End: 2016-05-18

## 2016-02-14 NOTE — Progress Notes (Signed)
Subjective:    Patient ID: Melody Jenkins, female    DOB: Nov 10, 1965, 50 y.o.   MRN: [id number]  Patient presents for Follow-up (back pain- wants referral for back pain) and Depression (increased stress in life)  Patient here for one-week follow-up. Since her fall back in July she has been having persistent back pain and knee pain. She is follow by orthopedics for her knee which showed an MCL tear was told this would heal without any intervention. She has follow-up this month for this. However her back has been persistently worse. She did have x-ray which showed degenerative disc disease of the lumbar spine but no acute issues. She came in last week to discuss her back pain as well as some stress with her fianc who was recently hospitalized At time was to start her on clonazepam as needed for sleep acutely to help with her fianc being in the hospital. With regards to her back pain with radiation MRI was ordered this has not been done yet. She was also given a prednisone taper she was currently on Flexeril 10 mg 3 times a day she has and meloxicam He still having significant pain. She is scheduled to have her MRI tomorrow morning. She does not have any pain medication the meloxicam is not helping the Flexeril is helping minimally mostly just putting her to sleep. She is under a lot of stress with her fianc he was still on life support via tracheostomy with a hypoxic brain injury. She is unable to work because of the stress with her fianc and then able to do things physically because free of pain in her back and her spasms.    Review Of Systems:  GEN- denies fatigue, fever, weight loss,weakness, recent illness HEENT- denies eye drainage, change in vision, nasal discharge, CVS- denies chest pain, palpitations RESP- denies SOB, cough, wheeze ABD- denies N/V, change in stools, abd pain GU- denies dysuria, hematuria, dribbling, incontinence MSK- + joint pain, muscle aches, injury Neuro- denies  headache, dizziness, syncope, seizure activity       Objective:    BP 140/88 (BP Location: Right Arm, Patient Position: Sitting, Cuff Size: Large)   Pulse 92   Temp 98.4 F (36.9 C) (Oral)   Resp 16   Ht 5\' 9"  (1.753 m)   Wt 212 lb (96.2 kg)   LMP 02/02/2016 (Approximate)   BMI 31.31 kg/m  GEN- NAD, alert and oriented x3 Psych- tearful , depressed affect, normal speech, normal thought process, no SI, good eye contact         Assessment & Plan:      Problem List Items Addressed This Visit    Grief reaction    Currently grieving with the current state of her fianc. Her chronic pain difficulty at work everything is very overwhelming. She will take the next 2 weeks out of work to one cell her back as well as get her imaging done and to spend sometime the hospital with the physicians and the family members regarding the care of her fianc. Continue the Klonopin as needed.      DDD (degenerative disc disease), lumbar    Known degenerative disc disease along with progressively worse back pain with radiculopathy bilaterally and severe spasm. MRI to be done tomorrow pending results we will get her to a specialist. I'll change her to Robaxin since this may be less sedating. I also refilled her hydrocodone.      Relevant Medications   methocarbamol (ROBAXIN) 500 MG  tablet   HYDROcodone-acetaminophen (NORCO) 7.5-325 MG tablet   Back pain with radiation   Relevant Medications   methocarbamol (ROBAXIN) 500 MG tablet   HYDROcodone-acetaminophen (NORCO) 7.5-325 MG tablet    Other Visit Diagnoses   None.     Note: This dictation was prepared with Dragon dictation along with smaller phrase technology. Any transcriptional errors that result from this process are unintentional.

## 2016-02-14 NOTE — Assessment & Plan Note (Signed)
Known degenerative disc disease along with progressively worse back pain with radiculopathy bilaterally and severe spasm. MRI to be done tomorrow pending results we will get her to a specialist. I'll change her to Robaxin since this may be less sedating. I also refilled her hydrocodone.

## 2016-02-14 NOTE — Patient Instructions (Signed)
Take pain medication Change to robaxin for muscle relaxer MRI in the morning Referral to specialist pending MRI result F/U pending results

## 2016-02-14 NOTE — Assessment & Plan Note (Signed)
Currently grieving with the current state of her fianc. Her chronic pain difficulty at work everything is very overwhelming. She will take the next 2 weeks out of work to one cell her back as well as get her imaging done and to spend sometime the hospital with the physicians and the family members regarding the care of her fianc. Continue the Klonopin as needed.

## 2016-02-15 ENCOUNTER — Other Ambulatory Visit: Payer: Self-pay | Admitting: *Deleted

## 2016-02-15 ENCOUNTER — Ambulatory Visit
Admission: RE | Admit: 2016-02-15 | Discharge: 2016-02-15 | Disposition: A | Discharge status: Home / Self Care | Payer: 59 | Source: Ambulatory Visit | Attending: Physician Assistant | Admitting: Physician Assistant

## 2016-02-15 DIAGNOSIS — M4310 Spondylolisthesis, site unspecified: Secondary | ICD-10-CM

## 2016-02-15 DIAGNOSIS — M47819 Spondylosis without myelopathy or radiculopathy, site unspecified: Secondary | ICD-10-CM

## 2016-02-15 DIAGNOSIS — M5441 Lumbago with sciatica, right side: Secondary | ICD-10-CM

## 2016-02-15 DIAGNOSIS — M5442 Lumbago with sciatica, left side: Principal | ICD-10-CM

## 2016-02-16 ENCOUNTER — Telehealth: Payer: Self-pay | Admitting: *Deleted

## 2016-02-16 NOTE — Telephone Encounter (Signed)
Received fax from [facility] ([address] [place] telephone/ [phone]~ fax) for short term disability forms.   Call placed to patient for more information.   [place].

## 2016-02-17 NOTE — Telephone Encounter (Addendum)
Call placed to patient for more information.   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  Verbalized that fee may be charged and is per provider prerogative.   Forms routed to provider.

## 2016-02-21 ENCOUNTER — Ambulatory Visit: Payer: 59 | Admitting: Family Medicine

## 2016-02-23 ENCOUNTER — Encounter: Payer: Self-pay | Admitting: Family Medicine

## 2016-02-24 ENCOUNTER — Encounter: Payer: Self-pay | Admitting: Family Medicine

## 2016-02-24 ENCOUNTER — Telehealth: Payer: Self-pay | Admitting: Family Medicine

## 2016-02-24 ENCOUNTER — Ambulatory Visit (INDEPENDENT_AMBULATORY_CARE_PROVIDER_SITE_OTHER): Payer: 59 | Admitting: Family Medicine

## 2016-02-24 VITALS — BP 152/84 | HR 88 | Temp 98.5°F | Resp 14 | Ht 69.0 in | Wt 216.0 lb

## 2016-02-24 DIAGNOSIS — F4321 Adjustment disorder with depressed mood: Secondary | ICD-10-CM | POA: Diagnosis not present

## 2016-02-24 DIAGNOSIS — F4323 Adjustment disorder with mixed anxiety and depressed mood: Secondary | ICD-10-CM | POA: Diagnosis not present

## 2016-02-24 DIAGNOSIS — G4700 Insomnia, unspecified: Secondary | ICD-10-CM

## 2016-02-24 MED ORDER — TRAZODONE HCL 50 MG PO TABS: 25.0000 mg | ORAL_TABLET | Freq: Every evening | ORAL | 3 refills | Status: DC | PRN

## 2016-02-24 MED ORDER — LORAZEPAM 1 MG PO TABS: 1.0000 mg | ORAL_TABLET | Freq: Two times a day (BID) | ORAL | 1 refills | Status: DC | PRN

## 2016-02-24 NOTE — Patient Instructions (Addendum)
Trazodone 50mg  at bedtime Take ativan as needed during the day  Form to be completed Change F/U to End of Sept - PHYSICAL

## 2016-02-24 NOTE — Progress Notes (Signed)
   Subjective:    Patient ID: Melody Jenkins, female    DOB: June 08, 1966, 50 y.o.   MRN: [id number]  Patient presents for Depression (increased chronic pain causing fatigue and feelings of being overwhelmed- denies suicidial ideations)  Pt here on 9/5 with grief reaction from fiancee being hospitlized on life support. There has been worsening family drama and as they are not legally married family is not giving her any info and will not let her in to see him. She is also in more pain from her back, not sleeping well, feeling anxious , depressed. Poor appetite. Denies SI She sent message via mychart yesterday stating her depression was worse and klonopin was not working to relax her.   She is also going out on short term disability , she feels she can not work right now with her nerves being bad and her back and knee pain. She has not returned to work since 9/5, prior to that she had intermittant FMLA from her orthopedist which she was using  Note her uncle also passed away had funeral this week   Review Of Systems:  GEN- denies fatigue, fever, weight loss,weakness, recent illness HEENT- denies eye drainage, change in vision, nasal discharge, CVS- denies chest pain, palpitations RESP- denies SOB, cough, wheeze ABD- denies N/V, change in stools, abd pain GU- denies dysuria, hematuria, dribbling, incontinence MSK- + joint pain, muscle aches, injury Neuro- denies headache, dizziness, syncope, seizure activity       Objective:    BP (!) 152/84 (BP Location: Right Arm, Patient Position: Sitting, Cuff Size: Large)   Pulse 88   Temp 98.5 F (36.9 C) (Oral)   Resp 14   Ht 5\' 9"  (1.753 m)   Wt 216 lb (98 kg)   LMP 02/02/2016 (Approximate)   BMI 31.90 kg/m  GEN- NAD, alert and oriented x3 Psych- tearful, depressed appearing, not anxious, good eye contact, no SI, no hallucinations, normal speech, normal thought process         Assessment & Plan:      Problem List Items Addressed  This Visit    Situational mixed anxiety and depressive disorder    Situational depression and anxiety, with grief, pain exacerbating things. Will add trazodone at bedtime for sleep, also anti-depressant  D/C Klonopin, start lorazepam Based on her current mental state, would benefit from some time out of work Unclear what to do about family situation and her visitation. Will write disability with tentative return date of Oct 9th.  She has appt to see neurosurgery next week      Insomnia   Grief reaction - Primary    Other Visit Diagnoses   None.     Note: This dictation was prepared with Dragon dictation along with smaller phrase technology. Any transcriptional errors that result from this process are unintentional.

## 2016-02-24 NOTE — Telephone Encounter (Signed)
MD to be made aware.  

## 2016-02-24 NOTE — Telephone Encounter (Signed)
Patient is calling urgently to tell you she mis communicated info to you, she said to let you know her last day of working was 02/14/2016

## 2016-02-26 DIAGNOSIS — F4323 Adjustment disorder with mixed anxiety and depressed mood: Secondary | ICD-10-CM | POA: Insufficient documentation

## 2016-02-26 MED ORDER — HYDROCHLOROTHIAZIDE 25 MG PO TABS: 25.0000 mg | ORAL_TABLET | Freq: Every day | ORAL | 3 refills | Status: DC

## 2016-02-26 NOTE — Assessment & Plan Note (Signed)
Situational depression and anxiety, with grief, pain exacerbating things. Will add trazodone at bedtime for sleep, also anti-depressant  D/C Klonopin, start lorazepam Based on her current mental state, would benefit from some time out of work Unclear what to do about family situation and her visitation. Will write disability with tentative return date of Oct 9th.  She has appt to see neurosurgery next week

## 2016-02-27 ENCOUNTER — Encounter: Payer: Self-pay | Admitting: Family Medicine

## 2016-02-27 ENCOUNTER — Encounter: Payer: 59 | Admitting: Family Medicine

## 2016-02-28 ENCOUNTER — Encounter: Payer: Self-pay | Admitting: Family Medicine

## 2016-02-29 ENCOUNTER — Encounter: Payer: Self-pay | Admitting: Family Medicine

## 2016-02-29 ENCOUNTER — Telehealth: Payer: Self-pay | Admitting: *Deleted

## 2016-02-29 NOTE — Telephone Encounter (Signed)
Received call from [place], pharmacy tech with [facility].   Reports that she spoke with patient and patient states that HCTZ was to be stopped per discussion with MD.   I do not see any documentation as to stopping HCTZ.   MD please advise.

## 2016-02-29 NOTE — Telephone Encounter (Signed)
Call placed to [facility] and spoke with JoAnne. Patient claim examiner is Raquel Sarna. LM for emily to return call.   Call placed to patient. [place].

## 2016-02-29 NOTE — Telephone Encounter (Signed)
Received completed forms from provider.   No charge per provider.  Forms faxed.

## 2016-02-29 NOTE — Telephone Encounter (Signed)
Call pt  1. She needs to take the HCTZ her BP is high  2. We have called her FMLA to try to sort out the confusion.  She had the initial FMLA when she fell at the end of June. And the FMLA/ Disability I filled out after our visit on the 9/5.  She never mentioned needed anything for the 8/28-30th and [employer] did not take her out of work.  She also did NOT have any FMLA filled out on file for any intermittant leave for any continous period It seems she has called her [place] 4 different times to open cases??? Confusing the matter.  She called them about needing Intermittant from 8/28-8/30 as well and they mailed her the paperwork for these dates evidently but she did not return anything.   At this point she is covered from 9/5 like we discussed to the Oct date for her FMLA/Disability  I am willing to complete another form or addendum for the 8/28-8/30 as she did see [facility]  Call and see if they need a letter in writing for these additional days missed or if she needs a new form

## 2016-03-01 NOTE — Telephone Encounter (Signed)
Call placed to patient and patient made aware.   States that she spoke with [place] on 02/29/2016 and re-opened case for intermittent FMLA.   Forms to be faxed to [facility].

## 2016-03-05 ENCOUNTER — Encounter: Payer: Self-pay | Admitting: Family Medicine

## 2016-03-05 ENCOUNTER — Encounter: Payer: Self-pay | Admitting: *Deleted

## 2016-03-05 ENCOUNTER — Ambulatory Visit (INDEPENDENT_AMBULATORY_CARE_PROVIDER_SITE_OTHER): Payer: 59 | Admitting: Family Medicine

## 2016-03-05 VITALS — BP 128/74 | HR 84 | Temp 98.1°F | Resp 16 | Ht 69.0 in | Wt 208.0 lb

## 2016-03-05 DIAGNOSIS — F4323 Adjustment disorder with mixed anxiety and depressed mood: Secondary | ICD-10-CM | POA: Diagnosis not present

## 2016-03-05 DIAGNOSIS — Z113 Encounter for screening for infections with a predominantly sexual mode of transmission: Secondary | ICD-10-CM | POA: Diagnosis not present

## 2016-03-05 DIAGNOSIS — G4700 Insomnia, unspecified: Secondary | ICD-10-CM | POA: Diagnosis not present

## 2016-03-05 DIAGNOSIS — E785 Hyperlipidemia, unspecified: Secondary | ICD-10-CM | POA: Diagnosis not present

## 2016-03-05 DIAGNOSIS — Z1211 Encounter for screening for malignant neoplasm of colon: Secondary | ICD-10-CM

## 2016-03-05 DIAGNOSIS — I10 Essential (primary) hypertension: Secondary | ICD-10-CM | POA: Diagnosis not present

## 2016-03-05 DIAGNOSIS — Z0000 Encounter for general adult medical examination without abnormal findings: Secondary | ICD-10-CM | POA: Diagnosis not present

## 2016-03-05 LAB — CBC WITH DIFFERENTIAL/PLATELET
BASOS ABS: 58 {cells}/uL (ref 0–200)
Basophils Relative: 1 %
EOS PCT: 3 %
Eosinophils Absolute: 174 cells/uL (ref 15–500)
HCT: 44.2 % (ref 35.0–45.0)
Hemoglobin: 14.5 g/dL (ref 12.0–15.0)
Lymphocytes Relative: 42 %
Lymphs Abs: 2436 cells/uL (ref 850–3900)
MCH: 28.2 pg (ref 27.0–33.0)
MCHC: 32.8 g/dL (ref 32.0–36.0)
MCV: 85.8 fL (ref 80.0–100.0)
MONOS PCT: 9 %
MPV: 9.7 fL (ref 7.5–12.5)
Monocytes Absolute: 522 cells/uL (ref 200–950)
NEUTROS ABS: 2610 {cells}/uL (ref 1500–7800)
NEUTROS PCT: 45 %
PLATELETS: 353 10*3/uL (ref 140–400)
RBC: 5.15 MIL/uL — ABNORMAL HIGH (ref 3.80–5.10)
RDW: 14.6 % (ref 11.0–15.0)
WBC: 5.8 10*3/uL (ref 3.8–10.8)

## 2016-03-05 LAB — COMPREHENSIVE METABOLIC PANEL
ALBUMIN: 3.9 g/dL (ref 3.6–5.1)
ALK PHOS: 59 U/L (ref 33–130)
ALT: 10 U/L (ref 6–29)
AST: 13 U/L (ref 10–35)
BILIRUBIN TOTAL: 0.4 mg/dL (ref 0.2–1.2)
BUN: 13 mg/dL (ref 7–25)
CALCIUM: 9.8 mg/dL (ref 8.6–10.4)
CO2: 31 mmol/L (ref 20–31)
CREATININE: 0.94 mg/dL (ref 0.50–1.05)
Chloride: 101 mmol/L (ref 98–110)
GLUCOSE: 111 mg/dL — AB (ref 70–99)
Potassium: 3.7 mmol/L (ref 3.5–5.3)
SODIUM: 139 mmol/L (ref 135–146)
Total Protein: 7 g/dL (ref 6.1–8.1)

## 2016-03-05 LAB — LIPID PANEL
Cholesterol: 180 mg/dL (ref 125–200)
HDL: 30 mg/dL — AB (ref >=46)
LDL CALC: 121 mg/dL (ref <130)
Total CHOL/HDL Ratio: 6 Ratio — ABNORMAL HIGH (ref <=5.0)
Triglycerides: 145 mg/dL (ref <150)
VLDL: 29 mg/dL (ref <30)

## 2016-03-05 LAB — WET PREP FOR TRICH, YEAST, CLUE
Trich, Wet Prep: NONE SEEN
Yeast Wet Prep HPF POC: NONE SEEN

## 2016-03-05 LAB — TSH: TSH: 0.59 m[IU]/L

## 2016-03-05 MED ORDER — ESCITALOPRAM OXALATE 5 MG PO TABS: 5.0000 mg | ORAL_TABLET | Freq: Every day | ORAL | 3 refills | Status: DC

## 2016-03-05 NOTE — Assessment & Plan Note (Signed)
Recheck lipids
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
## 2016-03-05 NOTE — Assessment & Plan Note (Signed)
Improved back on HCTZ, fasting labs today

## 2016-03-05 NOTE — Assessment & Plan Note (Addendum)
Trial of lexapro 5mg  at bedtime Ativan d/c klonopin Very interesting about this conspiracy that she has found out about, also family is not letting her see her fiancee as well. She is now moving all of his things out of home Advised therapy would be helpful while sorting all of this out, she will call the EAP with her job

## 2016-03-05 NOTE — Patient Instructions (Signed)
STARt [place] AT BEDTIME Take ativan  We will call with lab results Referral for colonoscopy  F/U 4 weeks for recheck

## 2016-03-06 ENCOUNTER — Telehealth: Payer: Self-pay | Admitting: *Deleted

## 2016-03-06 LAB — HSV(HERPES SMPLX)ABS-I+II(IGG+IGM)-BLD
HSV 1 Glycoprotein G Ab, IgG: 26.1 Index — ABNORMAL HIGH (ref <0.90)
HSV 2 Glycoprotein G Ab, IgG: >23 Index — ABNORMAL HIGH (ref <0.90)
Herpes Simplex Vrs I&II-IgM Ab (EIA): 1.93 INDEX — ABNORMAL HIGH

## 2016-03-06 LAB — RPR

## 2016-03-06 LAB — GC/CHLAMYDIA PROBE AMP
CT Probe RNA: NOT DETECTED
GC PROBE AMP APTIMA: NOT DETECTED

## 2016-03-06 LAB — HIV ANTIBODY (ROUTINE TESTING W REFLEX): HIV: NONREACTIVE

## 2016-03-06 NOTE — Telephone Encounter (Signed)
Received call from Marquita Palms with [employer] [phone].  Requested more information about patient disability claim. 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   Reports that back spondylosis is noted as chronic condition, MCL tear was treated with NSAID's and bracing, and medication was given for anxiety. Inquired as ito if patient is being referred to psychiatry for situation anxiety/ depression.

## 2016-03-07 ENCOUNTER — Encounter: Payer: Self-pay | Admitting: Family Medicine

## 2016-03-07 NOTE — Telephone Encounter (Signed)
Letter written please fax to them

## 2016-03-08 NOTE — Telephone Encounter (Signed)
Call placed to [facility] to obtain fax number,   LM on VM.

## 2016-03-08 NOTE — Telephone Encounter (Signed)
Received return call from [place].   Fax # [phone].  Letter faxed.

## 2016-03-09 ENCOUNTER — Encounter: Payer: Self-pay | Admitting: Family Medicine

## 2016-03-11 ENCOUNTER — Encounter: Payer: Self-pay | Admitting: Family Medicine

## 2016-03-12 ENCOUNTER — Encounter: Payer: Self-pay | Admitting: Family Medicine

## 2016-03-12 ENCOUNTER — Other Ambulatory Visit: Payer: Self-pay | Admitting: Family Medicine

## 2016-03-13 ENCOUNTER — Telehealth: Payer: Self-pay | Admitting: *Deleted

## 2016-03-13 MED ORDER — HYDROCODONE-ACETAMINOPHEN 7.5-325 MG PO TABS: 1.0000 | ORAL_TABLET | Freq: Four times a day (QID) | ORAL | 0 refills | Status: DC | PRN

## 2016-03-13 NOTE — Telephone Encounter (Signed)
Decline since Norco given today

## 2016-03-13 NOTE — Telephone Encounter (Signed)
Ok to refill??  Last office visit 03/05/2016.  Last refill 02/14/2016.

## 2016-03-13 NOTE — Telephone Encounter (Signed)
Okay to refill? 

## 2016-03-13 NOTE — Telephone Encounter (Signed)
Ok to refill??  Last office visit 03/05/2016.  Of note, Hydrocodone has been approved on 210/08/2015.  Patient reports that she had to re-schedule injection to 03/20/2016.

## 2016-03-13 NOTE — Telephone Encounter (Signed)
Prescription printed and patient made aware to come to office to pick up after 2pm on 03/13/2016.

## 2016-03-13 NOTE — Telephone Encounter (Signed)
Received form from patient from [place] for disability insurance.   Requesting MD to complete form for car payment D/T MCL tear, spinal DDD and slipped disc. Requesting beginning of disability to be dated 12/07/2015 (ER visit for fall)- unknown return to work date.   Of note, patient has completed part of the section for MD to complete prior to dropping off form.   Verbalized that fee may be charged and is per provider prerogative.   Forms routed to provider.

## 2016-03-14 ENCOUNTER — Encounter: Payer: Self-pay | Admitting: Family Medicine

## 2016-03-14 ENCOUNTER — Encounter: Payer: Self-pay | Admitting: *Deleted

## 2016-03-14 NOTE — Telephone Encounter (Signed)
Received completed form from provider.   Patient made aware to come to office to pick up via MyChart.

## 2016-03-16 ENCOUNTER — Encounter: Payer: Self-pay | Admitting: *Deleted

## 2016-03-19 ENCOUNTER — Encounter: Payer: Self-pay | Admitting: Family Medicine

## 2016-03-20 DIAGNOSIS — F1120 Opioid dependence, uncomplicated: Secondary | ICD-10-CM | POA: Insufficient documentation

## 2016-03-28 ENCOUNTER — Encounter: Payer: Self-pay | Admitting: *Deleted

## 2016-03-29 ENCOUNTER — Other Ambulatory Visit: Payer: Self-pay | Admitting: *Deleted

## 2016-04-06 ENCOUNTER — Ambulatory Visit (INDEPENDENT_AMBULATORY_CARE_PROVIDER_SITE_OTHER): Payer: 59 | Admitting: Family Medicine

## 2016-04-06 ENCOUNTER — Encounter: Payer: Self-pay | Admitting: Family Medicine

## 2016-04-06 VITALS — BP 140/82 | HR 82 | Temp 97.9°F | Resp 14 | Ht 69.0 in | Wt 203.0 lb

## 2016-04-06 DIAGNOSIS — Z7189 Other specified counseling: Secondary | ICD-10-CM | POA: Diagnosis not present

## 2016-04-06 DIAGNOSIS — R7301 Impaired fasting glucose: Secondary | ICD-10-CM

## 2016-04-06 DIAGNOSIS — F4323 Adjustment disorder with mixed anxiety and depressed mood: Secondary | ICD-10-CM

## 2016-04-06 DIAGNOSIS — M5136 Other intervertebral disc degeneration, lumbar region: Secondary | ICD-10-CM

## 2016-04-06 DIAGNOSIS — S83412D Sprain of medial collateral ligament of left knee, subsequent encounter: Secondary | ICD-10-CM

## 2016-04-06 DIAGNOSIS — I10 Essential (primary) hypertension: Secondary | ICD-10-CM

## 2016-04-06 DIAGNOSIS — M51369 Other intervertebral disc degeneration, lumbar region without mention of lumbar back pain or lower extremity pain: Secondary | ICD-10-CM

## 2016-04-06 DIAGNOSIS — M4310 Spondylolisthesis, site unspecified: Secondary | ICD-10-CM

## 2016-04-06 LAB — BASIC METABOLIC PANEL
BUN: 16 mg/dL (ref 7–25)
CALCIUM: 9.9 mg/dL (ref 8.6–10.4)
CO2: 30 mmol/L (ref 20–31)
CREATININE: 0.92 mg/dL (ref 0.50–1.05)
Chloride: 99 mmol/L (ref 98–110)
Glucose, Bld: 100 mg/dL — ABNORMAL HIGH (ref 70–99)
Potassium: 3.9 mmol/L (ref 3.5–5.3)
SODIUM: 138 mmol/L (ref 135–146)

## 2016-04-06 LAB — HEMOGLOBIN A1C
Hgb A1c MFr Bld: 5.4 % (ref <5.7)
MEAN PLASMA GLUCOSE: 108 mg/dL

## 2016-04-06 MED ORDER — ESCITALOPRAM OXALATE 10 MG PO TABS: 10.0000 mg | ORAL_TABLET | Freq: Every day | ORAL | 3 refills | Status: DC

## 2016-04-06 MED ORDER — NAPROXEN 500 MG PO TABS: 500.0000 mg | ORAL_TABLET | Freq: Two times a day (BID) | ORAL | 2 refills | Status: DC | PRN

## 2016-04-06 NOTE — Assessment & Plan Note (Signed)
Obtain last note from Dr. Marlou Sa, naprosyn, PT as he prescribed

## 2016-04-06 NOTE — Progress Notes (Signed)
Subjective:    Patient ID: Melody Jenkins, female    DOB: 10-18-1965, 50 y.o.   MRN: [id number]  Patient presents for Follow-up (is not fasting) and Forms (diability forms)  Patient here for follow-up. She is currently on short-term disability with multiple issues. Her primary diagnosis for going out disabilities or depression anxiety her fianc passed away a few weeks ago there is still issues between her in the family itself. Her secondary issues resulted from her fall back , She had one epidural injection but is still having pain and difficulty sitting for greater than one hour. She saw a neurosurgeon earlier this week who recommended she start physical therapy. She still has her hydrocodone at home after I discussed with her that she had told the pain doctor that I have not prescribed her anything. She is also using the Robaxin. She will like to continue with the Naprosyn instead of the meloxicam. With regards to her MCL tear this is healing on its own. Her orthopedist has recommended physical therapy for her knee. She states that her seat knee continues to give her a lot of pain.  With regards to the situational depression and anxiety At last visit started on lexapro 5mg , and ativan 9/25, recommended she see therapy. She did call the EAP with her company she has been having phone calls with a Dr. Jacelyn Grip as well as other counselors when she feels like she is in acute crisis in the evening. She has had 3 and office visits with him. She knows that this visits will run out and she does not feel like she has gotten very far with this therapist will like referral to a new one. She continues to have difficulty sleeping and finds herself tearful at nighttime.  She has a form today that she needs completed secondary to a personal loan that she is unable to pay that she is on disability  In general per all of the above she does feel like she is improving  Hypertension she ran out of her blood  pressure medicine yesterday  Review Of Systems:  GEN- denies fatigue, fever, weight loss,weakness, recent illness HEENT- denies eye drainage, change in vision, nasal discharge, CVS- denies chest pain, palpitations RESP- denies SOB, cough, wheeze ABD- denies N/V, change in stools, abd pain GU- denies dysuria, hematuria, dribbling, incontinence MSK- + joint pain, muscle aches, injury Neuro- denies headache, dizziness, syncope, seizure activity       Objective:    BP 140/82 (BP Location: Left Arm, Patient Position: Sitting, Cuff Size: Large)   Pulse 82   Temp 97.9 F (36.6 C) (Oral)   Resp 14   Ht 5\' 9"  (1.753 m)   Wt 203 lb (92.1 kg)   BMI 29.98 kg/m  GEN- NAD, alert and oriented x3 HEENT- PERRL, EOMI, non injected sclera, pink conjunctiva, MMM, oropharynx clear CVS- RRR, no murmur RESP-CTAB Psych- Stressed appearing, not anxious, no SI, tearful at times        Assessment & Plan:      Problem List Items Addressed This Visit    Tear of MCL (medial collateral ligament) of knee, left, subsequent encounter    Obtain last note from Dr. Marlou Sa, naprosyn, PT as he prescribed       Situational mixed anxiety and depressive disorder    Increase Lexapro to 10 mg once a day. I will get her further to a new psychotherapist. She will benefit from ongoing counseling especially with the grief component. She'll  continue the lorazepam as needed for anxiety She will be in physical therapy for both her back in her knee and ongoing therapy sessions we will extend her disability so that she may return to work on December 11      Essential hypertension, benign - Primary    Pressure little elevated today she has not taken her medication recommend she restart this      DDD (degenerative disc disease), lumbar    Degenerative disc disease with slipped disc. She will follow orders as prescribed by neurosurgery she does have hydrocodone at home which I checked the database on. If recommended  that she can take a half a tablet this makes her too sleepy and will not prescribe anything new today. She continue with the Robaxin and I have refilled her Naprosyn      Relevant Medications   naproxen (NAPROSYN) 500 MG tablet   Anterolisthesis    Other Visit Diagnoses    Elevated fasting glucose       Relevant Orders   Hemoglobin [id number]   Basic metabolic panel      Note: This dictation was prepared with Dragon dictation along with smaller phrase technology. Any transcriptional errors that result from this process are unintentional.

## 2016-04-06 NOTE — Assessment & Plan Note (Addendum)
Increase Lexapro to 10 mg once a day. I will get her further to a new psychotherapist. She will benefit from ongoing counseling especially with the grief component. She'll continue the lorazepam as needed for anxiety She will be in physical therapy for both her back in her knee and ongoing therapy sessions we will extend her disability so that she may return to work on December 11

## 2016-04-06 NOTE — Assessment & Plan Note (Signed)
Degenerative disc disease with slipped disc. She will follow orders as prescribed by neurosurgery she does have hydrocodone at home which I checked the database on. If recommended that she can take a half a tablet this makes her too sleepy and will not prescribe anything new today. She continue with the Robaxin and I have refilled her Naprosyn

## 2016-04-06 NOTE — Patient Instructions (Addendum)
Take naprosyn for inflammation with food Robaxin is the muscle relaxer Take 1/2 tablet of the hydrocodone for severe pain COntinue the lexapro increased to 10mg  Continue the ativan as prescribed  Restart the HCTZ once a day  Disability extended through Dec 10th, return to work on Dec 11th  F/U Dec 8th

## 2016-04-06 NOTE — Assessment & Plan Note (Signed)
Pressure little elevated today she has not taken her medication recommend she restart this

## 2016-04-09 ENCOUNTER — Encounter: Payer: Self-pay | Admitting: *Deleted

## 2016-04-09 ENCOUNTER — Encounter: Payer: Self-pay | Admitting: Family Medicine

## 2016-04-10 ENCOUNTER — Encounter: Payer: Self-pay | Admitting: *Deleted

## 2016-04-10 ENCOUNTER — Telehealth: Payer: Self-pay | Admitting: *Deleted

## 2016-04-10 NOTE — Telephone Encounter (Signed)
Received call from patient.   Reports that she requires records from [place] on 04/06/2016 with PCP for her disability appeal.   Please contact patient in regards to records.

## 2016-04-11 ENCOUNTER — Encounter: Payer: Self-pay | Admitting: Family Medicine

## 2016-04-11 NOTE — Telephone Encounter (Signed)
I have printed out her last office note and faxed to [facility] for the appeal. I have put claim [phone] per [name].

## 2016-04-12 ENCOUNTER — Encounter (HOSPITAL_COMMUNITY): Payer: Self-pay | Admitting: Licensed Clinical Social Worker

## 2016-04-12 ENCOUNTER — Ambulatory Visit (INDEPENDENT_AMBULATORY_CARE_PROVIDER_SITE_OTHER): Payer: 59 | Admitting: Licensed Clinical Social Worker

## 2016-04-12 DIAGNOSIS — F4320 Adjustment disorder, unspecified: Secondary | ICD-10-CM

## 2016-04-12 DIAGNOSIS — F4323 Adjustment disorder with mixed anxiety and depressed mood: Secondary | ICD-10-CM | POA: Diagnosis not present

## 2016-04-12 DIAGNOSIS — F4321 Adjustment disorder with depressed mood: Secondary | ICD-10-CM
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
## 2016-04-16 ENCOUNTER — Encounter: Payer: Self-pay | Admitting: Family Medicine

## 2016-04-16 ENCOUNTER — Telehealth: Payer: Self-pay | Admitting: Family Medicine

## 2016-04-16 NOTE — Telephone Encounter (Signed)
Message via mychart

## 2016-04-16 NOTE — Telephone Encounter (Signed)
Patient called she would like to know if she can take her naproxen 500mg  for cramps. She states she has started her menstrual again she did say she wasn't taking any of the pain medication she is only taking her nerve pill that is on file. She did say you could use the portal to respond if you would like.  CB# [phone]

## 2016-04-19 ENCOUNTER — Ambulatory Visit (INDEPENDENT_AMBULATORY_CARE_PROVIDER_SITE_OTHER): Payer: 59 | Admitting: Licensed Clinical Social Worker

## 2016-04-19 DIAGNOSIS — F4320 Adjustment disorder, unspecified: Secondary | ICD-10-CM

## 2016-04-19 DIAGNOSIS — F4321 Adjustment disorder with depressed mood: Secondary | ICD-10-CM

## 2016-04-19 DIAGNOSIS — F4323 Adjustment disorder with mixed anxiety and depressed mood: Secondary | ICD-10-CM

## 2016-04-20 ENCOUNTER — Telehealth (INDEPENDENT_AMBULATORY_CARE_PROVIDER_SITE_OTHER): Payer: Self-pay

## 2016-04-20 NOTE — Telephone Encounter (Signed)
Voice mail from Dr Gillis Santa, to discuss disab review, [phone], patient gives verbal auth for [place] to speak with him.  I will have Dr Marlou Sa call.

## 2016-04-20 NOTE — Telephone Encounter (Signed)
Dr Marlou Sa returned a call to Dr Gillis Santa and left a message

## 2016-04-20 NOTE — Telephone Encounter (Signed)
Dr. Vista Lawman called stating that she needed to speak with Dr. Marlou Sa concerning patient disability claim.  Stated that claim was denied and patient filed an appeal.  She also stated that she has already received some documentation for patient.

## 2016-04-23 ENCOUNTER — Telehealth: Payer: Self-pay | Admitting: *Deleted

## 2016-04-23 NOTE — Telephone Encounter (Signed)
Received call from Dr. Louisa Second in regards to a peer review for disability for patient.   Please contact him at [phone].

## 2016-04-23 NOTE — Telephone Encounter (Signed)
Called the number- this is for an office in [place] [facility] They closed at 4pm, the after hours line answered Will call back in the morning

## 2016-04-24 ENCOUNTER — Encounter: Payer: Self-pay | Admitting: Family Medicine

## 2016-04-24 ENCOUNTER — Encounter (HOSPITAL_COMMUNITY): Payer: Self-pay | Admitting: Licensed Clinical Social Worker

## 2016-04-24 NOTE — Progress Notes (Signed)
   THERAPIST PROGRESS NOTE  Session Time: 4:10-5pm  Participation Level: Active  Behavioral Response: CasualAlertDepressed  Type of Therapy: Individual Therapy  Treatment Goals addressed: Coping  Interventions: Supportive and Reframing  Summary: Melody Jenkins is a 50 y.o. female who presents with depression and grief. Pt's fiance of 7 years died and she is experiencing grief and depression. Pt was encouraged to contact Hospice for grief counseling and or support groups. Pt is still out on STD from her job and does not feel strong enough to go back to work. Pt was tearful in describing her current life. Encouraged pt to visiualize what her future could look like. Pt was encouraged to use boundaries with friends and family who are asking too much from her. Role played boundaries with pt. She knows it will be hard to use boundaries because everyone depends on her. Suggested for pt to practice self-care: lighting candles in her home, buying electric fireplace, scented oils. Pt also said she may get another dog who could be with her inside the house.She has 2 that live outside. Pt has a psychiatrist appt on 12/1 with Dr. Adele Schilder. Pt was receptive to suggestions and role play.Pt was receptive to the intervention.  Suicidal/Homicidal: Nowithout intent/plan  Therapist Response: Assessed pt's current functioning for baseline. Assisted pt in processing issues with family and friends, grief support and processing for management of stressors.  Plan: Return again in 2 weeks.  Diagnosis: Axis I: F43.23, F43.20        [place], LCAS 04/24/2016

## 2016-04-24 NOTE — Telephone Encounter (Signed)
I spoke with Dr. Louisa Second, he wanted clarification regarding her spondylosis in her treatment. Reviewed the last note from neurosurgeon which was on October 24 at that time epidural injection was done she was started in physical therapy to be done with the therapy for her MCL tear by Dr. Marlou Sa. Recommendations to be out of work for another 4-6 weeks and she was to follow-up in December.   I also spoke with the psychiatrist he was reviewing her case on last Friday discussed that she was out secondary to depression and anxiety and grief her fianc was initially sick and then passed away she is currently in psychotherapy.

## 2016-04-26 ENCOUNTER — Other Ambulatory Visit: Payer: Self-pay | Admitting: Neurosurgery

## 2016-04-26 DIAGNOSIS — R5381 Other malaise: Secondary | ICD-10-CM

## 2016-04-26 DIAGNOSIS — M461 Sacroiliitis, not elsewhere classified: Secondary | ICD-10-CM | POA: Insufficient documentation

## 2016-05-08 ENCOUNTER — Telehealth: Payer: Self-pay | Admitting: *Deleted

## 2016-05-08 ENCOUNTER — Ambulatory Visit (INDEPENDENT_AMBULATORY_CARE_PROVIDER_SITE_OTHER): Payer: 59 | Admitting: Licensed Clinical Social Worker

## 2016-05-08 DIAGNOSIS — F4323 Adjustment disorder with mixed anxiety and depressed mood: Secondary | ICD-10-CM | POA: Diagnosis not present

## 2016-05-08 DIAGNOSIS — F4320 Adjustment disorder, unspecified: Secondary | ICD-10-CM

## 2016-05-08 DIAGNOSIS — F4321 Adjustment disorder with depressed mood: Secondary | ICD-10-CM

## 2016-05-08 NOTE — Progress Notes (Signed)
   THERAPIST PROGRESS NOTE  Session Time: 2:10-3pm  Participation Level: Active  Behavioral Response: CasualAlertDepressed  Type of Therapy: Individual Therapy  Treatment Goals addressed: Coping  Interventions: Supportive and Reframing  Summary: Melody Jenkins is a 50 y.o. female who presents with depression and grief. Pt appeared less depressed today. She reports she feels somewhat better in the grief process. Processed with pt the grief process. Pt has contacted Hospice but they have not called her back. I called Hospice and left a message for to be called back. Pt is still experiencing back pain and has upcoming dr appts. She finally received her STD payment so is not experiencing financial difficulties. Discussed plans for return to work with pt. She has not gone into her home office since the passing of her fiance, too many memories. Suggested pt to slowly enter the home office a little at a time. Also suggested to rearrange the office and put her favorite things in there. Pt was receptive to the suggestions. Pt has also not slept in their bed. Also suggested to slowly go in the room, lay on bed for a few minutes, do it slowly. Pt is tearful in describing her current life, so encouraged pt to visiualize what her future could look like, by using a vision board (homework). Pt was receptive to this idea. Again, suggested for pt to practice self-care: lighting candles in her home, buying electric fireplace, scented oils. Pt also said she may get another dog who could be with her inside the house and be her companion. Pt has a psychiatrist appt on 12/1 with Dr. Adele Schilder. Pt was receptive to suggestions and intervention.  Suicidal/Homicidal: Nowithout intent/plan  Therapist Response: Assessed pt's current functioning for baseline. Assisted pt in processing grief support and processing for management of stressors.  Plan: Return again in 2 weeks.  Diagnosis: Axis I: F43.23,  F43.20        [place], LCAS 05/08/2016

## 2016-05-08 NOTE — Telephone Encounter (Signed)
Received VM from patient.   Requested refill on Tramadol for lower back pain. States that she continues to see Dr. Saintclair Halsted and he is trying to get authorization for spinal scan, but she is in a lot of pain at this time.   Attempted to call patient to inquire. [place].

## 2016-05-09 ENCOUNTER — Encounter (HOSPITAL_COMMUNITY): Payer: Self-pay | Admitting: Licensed Clinical Social Worker

## 2016-05-10 NOTE — Telephone Encounter (Signed)
Call placed to patient.   Advised to F/U with Dr. Saintclair Halsted for pain management.   States that she will contact Dr. Saintclair Halsted now. Is scheduled for F/U with PCP in (1) week.

## 2016-05-11 ENCOUNTER — Ambulatory Visit (INDEPENDENT_AMBULATORY_CARE_PROVIDER_SITE_OTHER): Payer: 59 | Admitting: Psychiatry

## 2016-05-11 ENCOUNTER — Encounter (HOSPITAL_COMMUNITY): Payer: Self-pay | Admitting: Psychiatry

## 2016-05-11 VITALS — BP 150/86 | HR 104 | Ht 68.0 in | Wt 212.6 lb

## 2016-05-11 DIAGNOSIS — F331 Major depressive disorder, recurrent, moderate: Secondary | ICD-10-CM

## 2016-05-11 DIAGNOSIS — Z888 Allergy status to other drugs, medicaments and biological substances status: Secondary | ICD-10-CM

## 2016-05-11 DIAGNOSIS — F17210 Nicotine dependence, cigarettes, uncomplicated: Secondary | ICD-10-CM

## 2016-05-11 DIAGNOSIS — Z9889 Other specified postprocedural states: Secondary | ICD-10-CM

## 2016-05-11 DIAGNOSIS — Z8249 Family history of ischemic heart disease and other diseases of the circulatory system: Secondary | ICD-10-CM

## 2016-05-11 DIAGNOSIS — F4310 Post-traumatic stress disorder, unspecified: Secondary | ICD-10-CM

## 2016-05-11 DIAGNOSIS — Z833 Family history of diabetes mellitus: Secondary | ICD-10-CM

## 2016-05-11 DIAGNOSIS — Z79899 Other long term (current) drug therapy: Secondary | ICD-10-CM

## 2016-05-11 DIAGNOSIS — Z841 Family history of disorders of kidney and ureter: Secondary | ICD-10-CM

## 2016-05-11 MED ORDER — HYDROXYZINE PAMOATE 25 MG PO CAPS: 25.0000 mg | ORAL_CAPSULE | Freq: Two times a day (BID) | ORAL | 1 refills | Status: DC | PRN

## 2016-05-11 NOTE — Progress Notes (Signed)
Psychiatric Initial Adult Assessment   Patient Identification: Melody Jenkins MRN:  [id number] Date of Evaluation:  05/11/2016 Referral Source: Primary care physician Dr. Buelah Manis  Chief Complaint:   [place]; Depression; Anxiety     Visit Diagnosis:    ICD-9-CM ICD-10-CM   1. PTSD (post-traumatic stress disorder) 309.81 F43.10 hydrOXYzine (VISTARIL) 25 MG capsule  2. Moderate episode of recurrent major depressive disorder (HCC) 296.32 F33.1 hydrOXYzine (VISTARIL) 25 MG capsule    History of Present Illness:  Patient is 50 year old divorced African-American employed female who is referred from her primary care physician and her therapist Eustaquio Maize for seeking management for her depression and anxiety symptoms.  Patient mentioned her symptoms started to feel months ago when she had fell and cause injury to her back.  Soon after her fianc who she been with him for 7 years died all of a sudden while in the hospital.  Patient witnessed the death and she remember that he is gasping for air and received CPR but could not survive.  Since that she's been experiencing increased anxiety, nightmares, flashback, crying spells and anhedonia.  Her primary care physician started her on Lexapro 10 mg and lorazepam 1 mg as needed back in August when she fell.  She admitted not taking the Lexapro every day and only take as needed when she feels very nervous and cannot sleep.  But she finished her lorazepam very soon because she was taking 1 mg 3 times a day to help her anxiety.  She is ran out from lorazepam and not taken it in a while.  She admitted her anxiety is very high.  She feel very anxious, nervous, easily tearful, having nightmares and flashback.  She has not able to go back to work since her fianc died.  She endorsed her thanks having was very difficult because she was missing him a lot.  Patient has limited support because she lives by herself.  She has 34 year old daughter who  usually comes to clean the house when she needed.  Her mother lives in [place] and she rarely see her but does talk to her on every day.  She continues to have back pain and she is getting physical therapy to cut she cannot stand more than 15 minutes.  She is seeing Beth in this office for grief and she has seen some improvement but she still have a lot of symptoms of depression, hopelessness, worthlessness.  Patient also endorse social drinking on the weekends and she admitted gets intoxicated but denies any seizures, blackouts, tremors or any shakes.  Patient works from home for [employer] however she's been out of work since her fianc died all of sudden.  She endorse lack of energy, poor appetite but denies any significant changes in her appetite.  Patient is no longer taking any narcotic pain medication which was prescribed and she had a surgery in June.  She is scheduled to see orthopedic surgeon and half soon because she is not getting any improvement in her back pain.  Associated Signs/Symptoms: Depression Symptoms:  depressed mood, anhedonia, insomnia, fatigue, feelings of worthlessness/guilt, difficulty concentrating, hopelessness, anxiety, panic attacks, loss of energy/fatigue, disturbed sleep, (Hypo) Manic Symptoms:  Irritable Mood, Anxiety Symptoms:  Excessive Worry, Psychotic Symptoms:  Patient has no psychotic symptoms. PTSD Symptoms: Had a traumatic exposure:  Patient saw her fianc death when he was gasping for air Re-experiencing:  Flashbacks Intrusive Thoughts Nightmares Avoidance:  Decreased Interest/Participation  Past Psychiatric History: Patient  endorse history of depression 20 years ago when she was given Lexapro by her primary care physician.  She remember a good response from the Lexapro.  Patient denies any history of psychiatric inpatient treatment, psychosis, suicidal attempt, mania, hallucination, OCD symptoms.   Previous Psychotropic Medications:  Patient try Lexapro in the past.  Substance Abuse History in the last 12 months:  No.  Consequences of Substance Abuse: Patient endorse history of drinking alcohol on the weekends and get some time intoxicated but denies any history of seizures, tremors, blackouts or any withdrawal symptoms.  Past Medical History:  Past Medical History:  Diagnosis Date  . Anemia   . Anxiety   . Asthma   . Depression   . History of multiple miscarriages   . Hyperlipidemia     Past Surgical History:  Procedure Laterality Date  . BREAST SURGERY  2000   reduction  . COSMETIC SURGERY  2000   breast reduction    Family Psychiatric History: Patient endorse mother has history of depression and bipolar disorder.  He has been admitted to [facility] for suicidal attempt.  Family History:  Family History  Problem Relation Age of Onset  . Diabetes Mother   . Hypertension Mother   . Hypertension Father   . Heart disease Father   . Early death Brother   . Kidney disease Maternal Grandmother   . Heart disease Brother     Social History:   Social History   Social History  . Marital status: Single    Spouse name: N/A  . Number of children: N/A  . Years of education: N/A   Social History Main Topics  . Smoking status: Current Every Day Smoker    Packs/day: 0.00    Types: Cigarettes  . Smokeless tobacco: Never Used  . Alcohol use 2.4 oz/week    4 Glasses of wine per week  . Drug use: No  . Sexual activity: Yes    Birth control/ protection: None   Other Topics Concern  . None   Social History Narrative  . None    Additional Social History: Patient lives by herself.  She has 69 year old daughter who lives in town and does help when she needed.  Her mother lives in [place] and patient talked to her on a regular basis.  Allergies:   Allergies  Allergen Reactions  . Trazodone And Nefazodone     Metabolic Disorder Labs: Lab Results  Component Value Date   HGBA1C 5.4 04/06/2016    MPG 108 04/06/2016   No results found for: PROLACTIN Lab Results  Component Value Date   CHOL 180 03/05/2016   TRIG 145 03/05/2016   HDL 30 (L) 03/05/2016   CHOLHDL 6.0 (H) 03/05/2016   VLDL 29 03/05/2016   LDLCALC 121 03/05/2016   LDLCALC 113 04/06/2015     Current Medications: Current Outpatient Prescriptions  Medication Sig Dispense Refill  . albuterol (PROVENTIL HFA;VENTOLIN HFA) 108 (90 BASE) MCG/ACT inhaler Inhale 2 puffs into the lungs every 6 (six) hours as needed for wheezing or shortness of breath.    . Cyanocobalamin (VITAMIN B 12 PO) Take by mouth.    . escitalopram (LEXAPRO) 10 MG tablet Take 1 tablet (10 mg total) by mouth at bedtime. 30 tablet 3  . hydrochlorothiazide (HYDRODIURIL) 25 MG tablet Take 1 tablet (25 mg total) by mouth daily. 30 tablet 3  . hydrOXYzine (VISTARIL) 25 MG capsule Take 1 capsule (25 mg total) by mouth 2 (two) times daily as needed.  60 capsule 1  . methocarbamol (ROBAXIN) 500 MG tablet Take 1 tablet (500 mg total) by mouth every 6 (six) hours as needed for muscle spasms. (Patient not taking: Reported on 04/06/2016) 45 tablet 1  . naproxen (NAPROSYN) 500 MG tablet Take 1 tablet (500 mg total) by mouth 2 (two) times daily as needed for mild pain, moderate pain or headache (TAKE WITH MEALS.). 60 tablet 2  . vitamin C (ASCORBIC ACID) 500 MG tablet Take 500 mg by mouth daily.    . Vitamin D, Cholecalciferol, 1000 UNITS CAPS Take by mouth.     No current facility-administered medications for this visit.     Neurologic: Headache: No Seizure: No Paresthesias:No  Musculoskeletal: Strength & Muscle Tone: decreased Gait & Station: Difficulty walking due to back pain Patient leans: Front and Backward  Psychiatric Specialty Exam: Review of Systems  Constitutional: Negative.   HENT: Negative.   Eyes: Negative.   Respiratory: Negative.   Cardiovascular: Negative.   Gastrointestinal: Negative.   Musculoskeletal: Positive for back pain and joint  pain.  Skin: Negative.   Neurological: Negative for dizziness, tingling and tremors.  Psychiatric/Behavioral: Positive for depression. The patient is nervous/anxious and has insomnia.     Blood pressure (!) 150/86, pulse (!) 104, height 5\' 8"  (1.727 m), weight 212 lb 9.6 oz (96.4 kg), SpO2 94 %.Body mass index is 32.33 kg/m.  General Appearance: Bizarre  Eye Contact:  Fair  Speech:  Clear and Coherent and Normal Rate  Volume:  Normal  Mood:  Anxious, Depressed and Dysphoric  Affect:  Constricted and Depressed  Thought Process:  Coherent and Goal Directed  Orientation:  Full (Time, Place, and Person)  Thought Content:  WDL and Illogical  Suicidal Thoughts:  No  Homicidal Thoughts:  No  Memory:  Immediate;   Fair Recent;   Good Remote;   Fair  Judgement:  Fair  Insight:  Fair  Psychomotor Activity:  Decreased  Concentration:  Concentration: Fair and Attention Span: Fair  Recall:  [employer] of Knowledge:Fair  Language: Good  Akathisia:  No  Handed:  Right  AIMS (if indicated):  None   Assets:  Communication Skills Desire for Improvement Financial Resources/Insurance Intimacy Vocational/Educational  ADL's:  Intact  Cognition: WNL  Sleep:  Fair     Treatment Plan Summary: Axis I; posttraumatic stress disorder.  Major depressive disorder, recurrent moderate  Axis II deferred  Plan; I review her psychosocial stressors, current medication, recent blood work results.  We talk about compliance issue with the Lexapro.  She is not taking every day and I encouraged to take every day to see the full efficacy in response.  In the past she had a good response to Lexapro.  At this time I will not change the dosage since patient is not taking it but recommended to take Lexapro 10 mg daily.  She has not taken lorazepam in a while and she has tendency to take more than usual lorazepam.  I recommended to try Vistaril 25 mg twice a day for anxiety and insomnia.  Encouraged to see Eustaquio Maize on a  regular basis for counseling.  He also discuss going back to work and recommended to try part-time as patient continues to have symptoms of anxiety and depression.  Discussed grief counseling .  Encouraged to continue physical therapy and keep appointment with orthopedic surgeon who would do bone scan as patient continued to have symptoms of back pain.  Discussed medication side effects and benefits.  Recommended to  call us back if she has any question, concern if she feels worsening of the symptom.  We also discuss if Lexapro did not work we will consider Cymbalta to have extra advantage on pain.  Discuss safety plan that anytime having active suicidal thoughts or homicidal thought she need to call 911 or go to the local emergency room.  Follow-up in 6 weeks.  ARFEEN,SYED T., MD 12/1/20179:49 AM

## 2016-05-15 ENCOUNTER — Other Ambulatory Visit (HOSPITAL_COMMUNITY): Payer: Self-pay | Admitting: Neurosurgery

## 2016-05-15 DIAGNOSIS — M5136 Other intervertebral disc degeneration, lumbar region: Secondary | ICD-10-CM

## 2016-05-18 ENCOUNTER — Encounter: Payer: Self-pay | Admitting: Family Medicine

## 2016-05-18 ENCOUNTER — Ambulatory Visit (INDEPENDENT_AMBULATORY_CARE_PROVIDER_SITE_OTHER): Payer: 59 | Admitting: Family Medicine

## 2016-05-18 ENCOUNTER — Telehealth: Payer: Self-pay | Admitting: *Deleted

## 2016-05-18 VITALS — BP 138/78 | HR 82 | Temp 98.2°F | Resp 14 | Ht 69.0 in | Wt 209.0 lb

## 2016-05-18 DIAGNOSIS — I10 Essential (primary) hypertension: Secondary | ICD-10-CM | POA: Diagnosis not present

## 2016-05-18 DIAGNOSIS — M4310 Spondylolisthesis, site unspecified: Secondary | ICD-10-CM

## 2016-05-18 DIAGNOSIS — F4323 Adjustment disorder with mixed anxiety and depressed mood: Secondary | ICD-10-CM

## 2016-05-18 DIAGNOSIS — S83412D Sprain of medial collateral ligament of left knee, subsequent encounter: Secondary | ICD-10-CM

## 2016-05-18 DIAGNOSIS — M549 Dorsalgia, unspecified: Secondary | ICD-10-CM

## 2016-05-18 MED ORDER — LORAZEPAM 0.5 MG PO TABS: 0.5000 mg | ORAL_TABLET | Freq: Every evening | ORAL | 1 refills | Status: DC | PRN

## 2016-05-18 MED ORDER — HYDROCODONE-ACETAMINOPHEN 7.5-325 MG PO TABS: 1.0000 | ORAL_TABLET | Freq: Four times a day (QID) | ORAL | 0 refills | Status: DC | PRN

## 2016-05-18 MED ORDER — CYCLOBENZAPRINE HCL 10 MG PO TABS: 10.0000 mg | ORAL_TABLET | Freq: Three times a day (TID) | ORAL | 0 refills | Status: DC | PRN

## 2016-05-18 NOTE — Telephone Encounter (Signed)
Received call from [place] with [facility] in regards to patient disability [phone]~ telephone/ [phone]~ fax.  Reports that they require the following information:  1. Date of last OV 2. Recovery status 3.Return to work status, either full or partial  A. If cleared, are there any limitations  B. If not cleared, is there an estimated time of recovery.   MD to be made aware.

## 2016-05-18 NOTE — Patient Instructions (Addendum)
F/U pending paperwork

## 2016-05-18 NOTE — Progress Notes (Signed)
Subjective:    Patient ID: Melody Jenkins, female    DOB: 1966/05/07, 50 y.o.   MRN: [id number]  Patient presents for Follow-up (is not fasting)   Patient here for follow-up she's currently out on disability secondary to injuries from a fall that occurred during the summer however that was compounded by the death of her fianc and treatment of her depression with anxiety.  She currently  in physical therapy for both her back in her knees per orthopedics- Dr. Saintclair Halsted  She continues to have severe pain not explained by her exams or MRI of lumbar spine, note fall was also in June., has bone scan to be done next week , assume looking for missed stress fracture or bony abnormality. States Dr. Saintclair Halsted took her out of work until Jan, I do not have records of this.    At end of visit she volunteered Dr. Saintclair Halsted wants to send her to pain management, and that he did not refill her pain meds, her last one I gave her.     - She also has lawyer for her case- [facility]  Regards to depression and anxiety the last visit to increase her Lexapro to 10 mg, he was already seen a  psychotherapist, he was also continued on lorazepam Note states that she has been non compliant with lexapro, also wanted to decrease use of atvan, started on hydroxzyune which she has been taking during the day but still cant sleep at night.Out of ativan  Psychiatry has recommended returning to work part-time, she states job does not have part time work     Review Of Systems:  GEN- denies fatigue, fever, weight loss,weakness, recent illness HEENT- denies eye drainage, change in vision, nasal discharge, CVS- denies chest pain, palpitations RESP- denies SOB, cough, wheeze ABD- denies N/V, change in stools, abd pain GU- denies dysuria, hematuria, dribbling, incontinence MSK-+ joint pain, muscle aches, injury Neuro- denies headache, dizziness, syncope, seizure activity       Objective:    BP 138/78 (BP Location: Left Arm,  Patient Position: Sitting, Cuff Size: Large)   Pulse 82   Temp 98.2 F (36.8 C) (Oral)   Resp 14   Ht 5\' 9"  (1.753 m)   Wt 209 lb (94.8 kg)   SpO2 98%   BMI 30.86 kg/m  GEN- NAD, alert and oriented x3 CVS- RRR, no murmur RESP-CTAB Psych- tearful, frustated appearing, not depressed or anxiosu, no SI         Assessment & Plan:      Problem List Items Addressed This Visit    Tear of MCL (medial collateral ligament) of knee, left, subsequent encounter   Situational mixed anxiety and depressive disorder    Discussed importance of medication compliance. Continue lexapro, agree with hydroxyzine during the day, use of ativan for sleep only to decrease use of sedating and habit forming medication. Psychiatry has recommmend returning back to work which she was quite upset about. Went on about him not knowing her and her situation.  With regards to the pain, her injury was 6 months ago, albeit she has had MCL tear non surgical and slipped disc, there have been no other fractures found thus far and would expect if there was a compression fracture as a result of the fall it occurred in June.  I am concerned her pain/subjective view is out of porportion to her ongoing evaluations. I will defer to neursurgery about her back pain, obtain the last 2 notes. I  advised pt unless there is something specific in writing stating she cant not return to work from her neurosurgeon Dr. Saintclair Halsted, I will release her back to work from my standpoint. If this is going to be continued disability I discuss with Dr. Saintclair Halsted to take over her further disability and paperwork      Essential hypertension, benign - Primary    Well controlled       Back pain with radiation   Relevant Medications   cyclobenzaprine (FLEXERIL) 10 MG tablet   HYDROcodone-acetaminophen (NORCO) 7.5-325 MG tablet   Anterolisthesis      Note: This dictation was prepared with Dragon dictation along with smaller phrase technology. Any  transcriptional errors that result from this process are unintentional.

## 2016-05-20 ENCOUNTER — Encounter: Payer: Self-pay | Admitting: Family Medicine

## 2016-05-20 NOTE — Assessment & Plan Note (Signed)
Discussed importance of medication compliance. Continue lexapro, agree with hydroxyzine during the day, use of ativan for sleep only to decrease use of sedating and habit forming medication. Psychiatry has recommmend returning back to work which she was quite upset about. Went on about him not knowing her and her situation.  With regards to the pain, her injury was 6 months ago, albeit she has had MCL tear non surgical and slipped disc, there have been no other fractures found thus far and would expect if there was a compression fracture as a result of the fall it occurred in June.  I am concerned her pain/subjective view is out of porportion to her ongoing evaluations. I will defer to neursurgery about her back pain, obtain the last 2 notes. I advised pt unless there is something specific in writing stating she cant not return to work from her neurosurgeon Dr. Saintclair Halsted, I will release her back to work from my standpoint. If this is going to be continued disability I discuss with Dr. Saintclair Halsted to take over her further disability and paperwork

## 2016-05-20 NOTE — Assessment & Plan Note (Signed)
Well controlled 

## 2016-05-21 ENCOUNTER — Encounter: Payer: Self-pay | Admitting: Family Medicine

## 2016-05-21 NOTE — Telephone Encounter (Signed)
Late documentation for 05/18/2016:   Patient seen in office.   Patient reports that Dr. Saintclair Halsted at [facility] had extended her leave to 06/22/2016. MD requested last OV notes from Dr. Saintclair Halsted. Also requested information if Dr. Saintclair Halsted had sent anything to [facility] for extended leave.   Call placed to [facility]. Office closed due to inclement weather on 05/18/2016.

## 2016-05-21 NOTE — Telephone Encounter (Signed)
Call placed to [facility]. Was advised patient last OV 05/15/2016 with Dr. Saintclair Halsted. Requested chart notes to be faxed to office.   Erin, Dr. Windy Carina CMA reports that work note given to patient on 05/15/2016 stating her return to work date will be re-evaluated on next appointment on 06/07/2016. Report that patient has since called and cancelled appointment.   Also reports no disability documentation has been sent to [facility].

## 2016-05-22 ENCOUNTER — Telehealth: Payer: Self-pay | Admitting: *Deleted

## 2016-05-22 ENCOUNTER — Ambulatory Visit (INDEPENDENT_AMBULATORY_CARE_PROVIDER_SITE_OTHER): Payer: 59 | Admitting: Licensed Clinical Social Worker

## 2016-05-22 DIAGNOSIS — F4310 Post-traumatic stress disorder, unspecified: Secondary | ICD-10-CM

## 2016-05-22 DIAGNOSIS — F331 Major depressive disorder, recurrent, moderate: Secondary | ICD-10-CM | POA: Diagnosis not present

## 2016-05-22 NOTE — Telephone Encounter (Signed)
Received call from patient.   Requested information in regards to disability paperwork for [facility].   Writer has not seen forms from [place].   Call placed to [facility]. Advised that form was faxed to [facility] on 05/18/2016. Requested forms to be re-sent to [facility]. Verified fax number.   Also was advised that Dr. Saintclair Halsted has faxed in disability forms for patient after 05/15/2016 appointment.

## 2016-05-22 NOTE — Telephone Encounter (Signed)
I spoke with Dr. Windy Carina nurse they've been having difficulty getting her bone scan set up. She spoke with the patient a few minutes ago and she states that her bone scan is scheduled for tomorrow. She will also be referred again to Dr. Brien Few for pain management. I discussed with him that I will put her out of work tentatively with a return date of 06/25/2016 if she needs to be on any further [employer] paperwork including her job and other supplemental disability papers will go to Dr. Saintclair Halsted to complete.  Pt was notified of tentative return to work date

## 2016-05-22 NOTE — Telephone Encounter (Signed)
Spoke with Dr. Windy Carina office.   Reports that patient is waiting on bone scan to be performed before F/U appointment will be scheduled.   MD spoke with Dr. Windy Carina MA. Agreed to tentative return to work date of 06/26/2015. If further leave required, Dr. Saintclair Halsted will be required to give leave.

## 2016-05-22 NOTE — Progress Notes (Signed)
   THERAPIST PROGRESS NOTE  Session Time: 2:10-3pm  Participation Level: Active  Behavioral Response: CasualAlertDepressed  Type of Therapy: Individual Therapy  Treatment Goals addressed: Coping  Interventions: Supportive and Reframing  Summary: [name] is a 50 y.o. female who presents with depression and grief. Pt is still struggling in the grief process. Processed with pt the grief process and how it's not linear and she may go back and forth between stages. Pt admits most of her friends are tired of her being sad and tell her to get on with her life. Pt is lost with little support. Gave pt the dates of the hospice bereavement group for loss of a spouse. Also gave pt the direct # to counselors at [facility] who she can call for counseling. Pt is still experiencing back pain on top of her grief and has upcoming dr appts and scans. Pt has not follow through with the suggestions for self-care. I suspect she is used to her fiance helping her out with her self care that she does not know how to proceed. Pt is out of work until the end of January 2018 by her ortho doc.  Pt saw Dr. Adele Schilder who put her on an anti-depressant. Encouraged her to take prescription as directed. Because pt still struggles in individual therapy referred pt to OP group 2x per week.  Pt was receptive to suggestions and intervention.  Suicidal/Homicidal: Nowithout intent/plan  Therapist Response: Assessed pt's current functioning for dealing with her stressors. Assisted pt in processing grief support and processing for management of stressors.  Plan: Return again in 2 weeks.  Diagnosis: Axis I: F43.23, F43.20        [place], LCAS 05/22/2016

## 2016-05-23 ENCOUNTER — Encounter (HOSPITAL_COMMUNITY): Payer: Self-pay | Admitting: Licensed Clinical Social Worker

## 2016-05-23 ENCOUNTER — Encounter (HOSPITAL_COMMUNITY)
Admission: RE | Admit: 2016-05-23 | Discharge: 2016-05-23 | Disposition: A | Discharge status: Home / Self Care | Payer: 59 | Source: Ambulatory Visit | Attending: Neurosurgery | Admitting: Neurosurgery

## 2016-05-23 ENCOUNTER — Ambulatory Visit (INDEPENDENT_AMBULATORY_CARE_PROVIDER_SITE_OTHER): Payer: 59 | Admitting: Licensed Clinical Social Worker

## 2016-05-23 ENCOUNTER — Encounter: Payer: Self-pay | Admitting: Family Medicine

## 2016-05-23 DIAGNOSIS — F4323 Adjustment disorder with mixed anxiety and depressed mood: Secondary | ICD-10-CM | POA: Diagnosis not present

## 2016-05-23 DIAGNOSIS — F4310 Post-traumatic stress disorder, unspecified: Secondary | ICD-10-CM

## 2016-05-23 DIAGNOSIS — M5136 Other intervertebral disc degeneration, lumbar region: Secondary | ICD-10-CM | POA: Insufficient documentation

## 2016-05-23 MED ORDER — TECHNETIUM TC 99M MEDRONATE IV KIT
25.0000 | PACK | Freq: Once | INTRAVENOUS | Status: AC | PRN
  Administered 2016-05-23: 25 via INTRAVENOUS

## 2016-05-23 NOTE — Telephone Encounter (Signed)
Call placed to [employer]~ telephone.   Forms have not been received.   Forms will be re-faxed to office.

## 2016-05-23 NOTE — Telephone Encounter (Signed)
Letter faxed to [place].

## 2016-05-24 ENCOUNTER — Telehealth: Payer: Self-pay | Admitting: Family Medicine

## 2016-05-24 NOTE — Telephone Encounter (Signed)
Delivered fmla form to christina on 05/24/2016 at 10:03 am

## 2016-05-24 NOTE — Telephone Encounter (Signed)
Forms received from [employer]- [phone]~ telephone/ [phone]~ fax.   Noted to be medical record request for Short Term Disability. Claim # [id number].  Requesting office treatment notes for the period of 05/10/2016 through present, diagnostic testing (lab reports, MRI, x-rays, etc), and any operative reports. Reports that request is urgent and requestd response by 05/28/2016.  Patient has been seen in office once in requested period.   Chart notes printed and faxed to [facility].

## 2016-05-25 NOTE — Telephone Encounter (Signed)
Notes faxed to [facility].

## 2016-05-25 NOTE — Telephone Encounter (Signed)
Notes from Dr. Saintclair Halsted and Dr. Adele Schilder (psychiatry) to be sent as well

## 2016-05-28 ENCOUNTER — Encounter: Payer: Self-pay | Admitting: Family Medicine

## 2016-05-28 ENCOUNTER — Ambulatory Visit (INDEPENDENT_AMBULATORY_CARE_PROVIDER_SITE_OTHER): Payer: 59 | Admitting: Licensed Clinical Social Worker

## 2016-05-28 DIAGNOSIS — F4310 Post-traumatic stress disorder, unspecified: Secondary | ICD-10-CM | POA: Diagnosis not present

## 2016-05-28 DIAGNOSIS — F4323 Adjustment disorder with mixed anxiety and depressed mood: Secondary | ICD-10-CM | POA: Diagnosis not present

## 2016-05-28 NOTE — Telephone Encounter (Signed)
Received VM from patient.   Patient is notably upset during message.   Patient is concerned as to why MD has indicated that she is able to return to work without documentation from Dr. Saintclair Halsted and Dr. Adele Schilder. Also reports that she is confused as to why she I being released back to work when she is continuing to have discomfort and she cannot sit or stand >28minutes without pain. Patient reports that no one has examined her in regards to not being able to sit or stand an that "no one has put hands on me". States that letter MD wrote is contradictory stating that he can return to work on 06/25/2016, but further time out of work should be handled by Dr. Saintclair Halsted. Patient states that she cannot believe the "audacity" of her being released to work without a phyiscal examination, and she will be contacting the medical board.   Discussed with PCP patient concerns, and MD advised that all concerns have been addressed multiple times. Documentation has been reviewed from both Dr. Saintclair Halsted and Dr. Adele Schilder. Dr. Saintclair Halsted will complete any future forms to further her release from work.   Before call could be placed to patient, new MyChart message had been received. Dr. Buelah Manis responded.

## 2016-05-29 ENCOUNTER — Encounter: Payer: Self-pay | Admitting: Family Medicine

## 2016-05-29 ENCOUNTER — Telehealth: Payer: Self-pay | Admitting: Family Medicine

## 2016-05-29 ENCOUNTER — Encounter (HOSPITAL_COMMUNITY): Payer: Self-pay | Admitting: Licensed Clinical Social Worker

## 2016-05-29 NOTE — Progress Notes (Signed)
Daily Group Progress Note Program:  Outpatient   Group Time: 5:30-6:30 pm  Participation Level: Active  Behavioral Response: Appropriate  Type of Therapy:  Psychoeducation/Therapy  Summary of Progress: Today was pt's first day in the outpatient group. She introduced herself with a brief history of what brought her to therapy. Pt participated in a discussion on identifying and coping with new feelings in mental health recovery. Pt was encouraged to continue to talk about her feelings in group, journal her thoughts and feelings and use her effective coping skills.    [name], LCAS

## 2016-05-29 NOTE — Telephone Encounter (Signed)
Spoke with Dr. Saintclair Halsted neurosurgeon for Melody Jenkins. We reviewed her case. He does note that she is still complaining of severe low back pain. 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 His next plan is to do facet injections which will be both diagnostic and hopefully will also give relief to her ongoing pain. He also is recommending that she go to a pain specialist as it is becoming more difficult to treat her pain. He did broach the subject of having spinal fusion on some patients who do not improve but he has not made any decision to do any surgical intervention on her at this time. She would actually need neuropsychiatric testing before she has this done.   I discussed with him the multiple complaints from Melody Jenkins. Including her multiple phone calls and emails she feels like she is not getting good care and that I am not listening to her concerns. She is also upset about the documentation that has been sent into her work from her visits. He also expressed that he has to discuss with her similar issue this AM at her visit.    At this time we have both agreed that after January 15 which is the letter that I have sent into her companies indicating that she will tentatively return to work on that day if she needs to remain out any further that Dr. Saintclair Halsted will take over all short-term disability or other paperwork needed.   Note she has stated in her emails that she is submitted me to the medical board for neglect and that she is also calling the hospital systems patient advocate. Please refer to my previous responses to her that I am willing to discuss this with her face-to-face but I am not going to email her back and forth through the patient portal as this is not the proper way to communicate all of her issues. Of note her last email was very confusing and difficult to follow as well. As of this  notation she has not called the office to let me know when she would like to come in. I have also alerted my office manager about this situation. I will contact risk management for whomever is needed to give them a heads-up about this case.

## 2016-05-30 ENCOUNTER — Encounter (HOSPITAL_COMMUNITY): Payer: Self-pay | Admitting: Licensed Clinical Social Worker

## 2016-05-30 ENCOUNTER — Ambulatory Visit (INDEPENDENT_AMBULATORY_CARE_PROVIDER_SITE_OTHER): Payer: 59 | Admitting: Licensed Clinical Social Worker

## 2016-05-30 DIAGNOSIS — F4310 Post-traumatic stress disorder, unspecified: Secondary | ICD-10-CM | POA: Diagnosis not present

## 2016-05-30 DIAGNOSIS — F4323 Adjustment disorder with mixed anxiety and depressed mood: Secondary | ICD-10-CM

## 2016-05-30 NOTE — Progress Notes (Signed)
Daily Group Progress Note Program:  Outpatient   Group Time: 5:30-6:30 pm  Participation Level: Active  Behavioral Response: Appropriate  Type of Therapy:  Psychoeducation/Therapy  Summary of Progress: Pt participated in a discussion on the effects of relationships on mental health recovery. Normally, persons with mental illness have small social networks than others and have more family members than friends in their relationship circles. Pt was encouraged to widen her circle of relationships by working more on her own self-care and coping skills for mental wellness.  [name], LCAS

## 2016-05-31 ENCOUNTER — Encounter (HOSPITAL_COMMUNITY): Payer: Self-pay | Admitting: Licensed Clinical Social Worker

## 2016-05-31 NOTE — Progress Notes (Signed)
Daily Group Progress Note Program:  Outpatient   Group Time: 5:30-6:30 pm  Participation Level: Active  Behavioral Response: Appropriate  Type of Therapy:  Psychoeducation/Therapy  Summary of Progress:  Pt participated in a discussion of mental wellness during the holiday season. It was noted during the discussion that anxiety and depression may become more prevalent during the Christmas holiday especially people who already live with a mental health condition. Pt was encouraged to take extra care in tending to her overall health and wellness during this time. [name], LCAS

## 2016-06-04 ENCOUNTER — Ambulatory Visit (HOSPITAL_COMMUNITY): Payer: Self-pay | Admitting: Licensed Clinical Social Worker

## 2016-06-06 ENCOUNTER — Ambulatory Visit (HOSPITAL_COMMUNITY): Payer: Self-pay | Admitting: Licensed Clinical Social Worker

## 2016-06-08 ENCOUNTER — Telehealth: Payer: Self-pay

## 2016-06-08 NOTE — Telephone Encounter (Signed)
Patient left vmail yesterday stating 2 of the 3 disability forms given to office have been received.  Requesting to know status of [employer] disability form. States requirements should be that date and signature added, then faxed to number listed on form.  Called patient and advised message received and forwarded to Dr. Dorian Heckle LPN.  Patient verbalized understanding.

## 2016-06-08 NOTE — Telephone Encounter (Signed)
Call placed to patient to inquire.   Reports that [employer] and [employer] have received updated disability information from PCP, but [employer] ha not received any new documentation.   Advised that the last form received from [place] was dated 03/07/2016. Advised that letter from MD can be faxed to [facility]. Faxed letter per patient request.

## 2016-06-11 ENCOUNTER — Ambulatory Visit (HOSPITAL_COMMUNITY): Payer: Self-pay | Admitting: Licensed Clinical Social Worker

## 2016-06-13 ENCOUNTER — Ambulatory Visit (INDEPENDENT_AMBULATORY_CARE_PROVIDER_SITE_OTHER): Payer: 59 | Admitting: Licensed Clinical Social Worker

## 2016-06-13 DIAGNOSIS — F4310 Post-traumatic stress disorder, unspecified: Secondary | ICD-10-CM | POA: Diagnosis not present

## 2016-06-13 DIAGNOSIS — F4320 Adjustment disorder, unspecified: Secondary | ICD-10-CM

## 2016-06-13 DIAGNOSIS — F4321 Adjustment disorder with depressed mood: Secondary | ICD-10-CM

## 2016-06-14 ENCOUNTER — Encounter (HOSPITAL_COMMUNITY): Payer: Self-pay | Admitting: Licensed Clinical Social Worker

## 2016-06-14 NOTE — Progress Notes (Signed)
Daily Group Progress Note Program:  Outpatient  Group Time: 5:30-6:30 pm  Participation Level: Active  Behavioral Response: Appropriate  Type of Therapy:  Psychoeducation/Therapy  Summary of Progress: Pt participated in a discussion entitled "What is your word for the new year, from the book One Word that Will Change your Life?" Pt shared how one word can create clarity, power, passion and life-change. One word - that impacts all dimensions of her life - mental, physical, emotional relational and spiritual. Her word was peace. Pt was encouraged to find small successes during the year by using her "word" and a roadmap. Pt was an active participant during the intervention and open to suggestions made by group members.  [name], LCAS

## 2016-06-16 NOTE — Telephone Encounter (Signed)
Noted, agree with sending previous letter written to this company so that all three have the same information on file for patient.

## 2016-06-18 ENCOUNTER — Ambulatory Visit (HOSPITAL_COMMUNITY): Payer: Self-pay | Admitting: Licensed Clinical Social Worker

## 2016-06-18 ENCOUNTER — Encounter: Payer: Self-pay | Admitting: Family Medicine

## 2016-06-18 ENCOUNTER — Telehealth (HOSPITAL_COMMUNITY): Payer: Self-pay | Admitting: Licensed Clinical Social Worker

## 2016-06-19 DIAGNOSIS — M47816 Spondylosis without myelopathy or radiculopathy, lumbar region: Secondary | ICD-10-CM | POA: Insufficient documentation

## 2016-06-20 ENCOUNTER — Ambulatory Visit (INDEPENDENT_AMBULATORY_CARE_PROVIDER_SITE_OTHER): Payer: 59 | Admitting: Licensed Clinical Social Worker

## 2016-06-20 DIAGNOSIS — F4310 Post-traumatic stress disorder, unspecified: Secondary | ICD-10-CM | POA: Diagnosis not present

## 2016-06-20 DIAGNOSIS — F331 Major depressive disorder, recurrent, moderate: Secondary | ICD-10-CM

## 2016-06-21 ENCOUNTER — Encounter (HOSPITAL_COMMUNITY): Payer: Self-pay | Admitting: Licensed Clinical Social Worker

## 2016-06-21 NOTE — Progress Notes (Signed)
Daily Group Progress Note Program:  Outpatient  Group Time: 5:30-6:30 pm  Participation Level: Active  Behavioral Response: Appropriate  Type of Therapy:  Psychoeducation/Therapy  Summary of Progress: Pt participated in a discussion on "finding purpose in my life" during mental health recovery. While suffering from a mental illness pt must satisfy herself spiritually, emotionally and mentally. The recovery process itself sparks passion to inspire growth. Pt was encouraged to continue to work on her self-discovery which plays a role in her recovery process. Pt was actively engaged in the intervention.    [name], LCAS

## 2016-06-25 ENCOUNTER — Ambulatory Visit (HOSPITAL_COMMUNITY): Payer: Self-pay | Admitting: Licensed Clinical Social Worker

## 2016-06-25 ENCOUNTER — Telehealth (HOSPITAL_COMMUNITY): Payer: Self-pay | Admitting: Licensed Clinical Social Worker

## 2016-06-27 ENCOUNTER — Ambulatory Visit (HOSPITAL_COMMUNITY): Payer: Self-pay | Admitting: Psychiatry

## 2016-06-27 ENCOUNTER — Ambulatory Visit (HOSPITAL_COMMUNITY): Payer: Self-pay | Admitting: Licensed Clinical Social Worker

## 2016-07-02 ENCOUNTER — Ambulatory Visit (INDEPENDENT_AMBULATORY_CARE_PROVIDER_SITE_OTHER): Payer: 59 | Admitting: Licensed Clinical Social Worker

## 2016-07-02 ENCOUNTER — Other Ambulatory Visit: Payer: Self-pay | Admitting: Family Medicine

## 2016-07-02 DIAGNOSIS — F4310 Post-traumatic stress disorder, unspecified: Secondary | ICD-10-CM

## 2016-07-02 DIAGNOSIS — F331 Major depressive disorder, recurrent, moderate: Secondary | ICD-10-CM | POA: Diagnosis not present

## 2016-07-03 ENCOUNTER — Encounter (HOSPITAL_COMMUNITY): Payer: Self-pay | Admitting: Licensed Clinical Social Worker

## 2016-07-03 NOTE — Progress Notes (Signed)
Daily Group Progress Note Program:  Outpatient  Group Time: 5:30-6:30 pm  Participation Level: Active  Behavioral Response: Appropriate  Type of Therapy: Psychoeducation/Therapy  Summary of Progress: Pt participated in a discussion on "Moving forward in change" during mental health recovery. Mental health recovery can be a challenge or even stalled but "I must always be open to change" by not letting the mental health diagnosis define me. Pt was encouraged to continue to come to a place of acceptance. Without acceptance, there is no peace. The first step in recovery. Pt was engaged in the intervention.  [name], LCAS

## 2016-07-04 ENCOUNTER — Ambulatory Visit (HOSPITAL_COMMUNITY): Payer: Self-pay | Admitting: Licensed Clinical Social Worker

## 2016-07-09 ENCOUNTER — Ambulatory Visit (INDEPENDENT_AMBULATORY_CARE_PROVIDER_SITE_OTHER): Payer: 59 | Admitting: Licensed Clinical Social Worker

## 2016-07-09 DIAGNOSIS — F4310 Post-traumatic stress disorder, unspecified: Secondary | ICD-10-CM | POA: Diagnosis not present

## 2016-07-09 DIAGNOSIS — F331 Major depressive disorder, recurrent, moderate: Secondary | ICD-10-CM

## 2016-07-10 ENCOUNTER — Ambulatory Visit (INDEPENDENT_AMBULATORY_CARE_PROVIDER_SITE_OTHER): Payer: 59 | Admitting: Psychiatry

## 2016-07-10 ENCOUNTER — Encounter (HOSPITAL_COMMUNITY): Payer: Self-pay | Admitting: Psychiatry

## 2016-07-10 DIAGNOSIS — F4310 Post-traumatic stress disorder, unspecified: Secondary | ICD-10-CM

## 2016-07-10 DIAGNOSIS — F17210 Nicotine dependence, cigarettes, uncomplicated: Secondary | ICD-10-CM

## 2016-07-10 DIAGNOSIS — Z833 Family history of diabetes mellitus: Secondary | ICD-10-CM | POA: Diagnosis not present

## 2016-07-10 DIAGNOSIS — Z9889 Other specified postprocedural states: Secondary | ICD-10-CM | POA: Diagnosis not present

## 2016-07-10 DIAGNOSIS — F331 Major depressive disorder, recurrent, moderate: Secondary | ICD-10-CM | POA: Diagnosis not present

## 2016-07-10 DIAGNOSIS — Z841 Family history of disorders of kidney and ureter: Secondary | ICD-10-CM

## 2016-07-10 DIAGNOSIS — Z888 Allergy status to other drugs, medicaments and biological substances status: Secondary | ICD-10-CM

## 2016-07-10 DIAGNOSIS — Z8249 Family history of ischemic heart disease and other diseases of the circulatory system: Secondary | ICD-10-CM

## 2016-07-10 DIAGNOSIS — Z79899 Other long term (current) drug therapy: Secondary | ICD-10-CM

## 2016-07-10 MED ORDER — HYDROXYZINE PAMOATE 25 MG PO CAPS: 25.0000 mg | ORAL_CAPSULE | Freq: Two times a day (BID) | ORAL | 2 refills | Status: DC | PRN

## 2016-07-10 MED ORDER — ESCITALOPRAM OXALATE 20 MG PO TABS: 20.0000 mg | ORAL_TABLET | Freq: Every day | ORAL | 2 refills | Status: DC

## 2016-07-10 NOTE — Progress Notes (Signed)
[facility] MD/PA/NP OP Progress Note  07/10/2016 9:29 AM Melody Jenkins  MRN:  [id number]  Chief Complaint:  Chief Complaint    Anxiety; Depression; Follow-up; Medication Refill     Subjective:  "I feel like I am moving backwards".  HPI: Pt states she is still having significant PTSD symptoms. She is having intrusive memories daily and occasional flashbacks. More than anything pt feels anger over her fiance's death . She is no longer having nightmares but she does dream about her fiance. Pt takes Ativan and Vistaril daily.   Depression has significantly worsened over the last 2 weeks. She is in a lot of pain since her fall in June. States the pain really limits her physical mobility. She often cries due to the pain. Pt remains social and she has started working again. Pt is endorsing hopelessness. Denies SI/HI.   Pt sleeps on the couch. She is getting about 4-5 hrs of broken sleep. Energy is low. Appetite is improving.  Individual and group therapy are helping. States Lexapro is helping. She doesn't feel as depressed as she did in the beginning.   Taking meds as prescribed and denies SE.     Visit Diagnosis: No diagnosis found.    Past Psychiatric History: Patient endorse history of depression 20 years ago when she was given Lexapro by her primary care physician.  She remember a good response from the Lexapro.  Patient denies any history of psychiatric inpatient treatment, psychosis, suicidal attempt, mania, hallucination, OCD symptoms.   Previous Psychotropic Medications: Patient try Lexapro in the past.  Substance Abuse History in the last 12 months:  No.  Consequences of Substance Abuse: Patient endorse history of drinking alcohol on the weekends and get some time intoxicated but denies any history of seizures, tremors, blackouts or any withdrawal symptoms.  Past Medical History:  Past Medical History:  Diagnosis Date  . Anemia   . Anxiety   . Asthma   . Depression   .  History of multiple miscarriages   . Hyperlipidemia     Past Surgical History:  Procedure Laterality Date  . BREAST SURGERY  2000   reduction  . COSMETIC SURGERY  2000   breast reduction    Family Psychiatric History: Patient endorse mother has history of depression and bipolar disorder. Brother has been admitted to [facility] for suicidal attempt but he passed over 6 yrs ago. .  Family History:  Family History  Problem Relation Age of Onset  . Diabetes Mother   . Hypertension Mother   . Hypertension Father   . Heart disease Father   . Early death Brother   . Kidney disease Maternal Grandmother   . Heart disease Brother     Social History: Patient lives by herself.  She has 69 year old daughter who lives in town and does help when she needed.  Her mother lives in [place] and patient talked to her on a regular basis. Social History   Social History  . Marital status: Single    Spouse name: N/A  . Number of children: N/A  . Years of education: N/A   Social History Main Topics  . Smoking status: Current Every Day Smoker    Packs/day: 0.00    Types: Cigarettes  . Smokeless tobacco: Never Used  . Alcohol use 2.4 oz/week    4 Glasses of wine per week  . Drug use: No  . Sexual activity: Yes    Birth control/ protection: None   Other Topics Concern  .  None   Social History Narrative  . None    Allergies:  Allergies  Allergen Reactions  . Trazodone And Nefazodone     Metabolic Disorder Labs: Lab Results  Component Value Date   HGBA1C 5.4 04/06/2016   MPG 108 04/06/2016   No results found for: PROLACTIN Lab Results  Component Value Date   CHOL 180 03/05/2016   TRIG 145 03/05/2016   HDL 30 (L) 03/05/2016   CHOLHDL 6.0 (H) 03/05/2016   VLDL 29 03/05/2016   LDLCALC 121 03/05/2016   LDLCALC 113 04/06/2015     Current Medications: Current Outpatient Prescriptions  Medication Sig Dispense Refill  . albuterol (PROVENTIL HFA;VENTOLIN HFA) 108 (90 BASE)  MCG/ACT inhaler Inhale 2 puffs into the lungs every 6 (six) hours as needed for wheezing or shortness of breath.    . Biotin 10 MG CAPS Take by mouth.    . Cyanocobalamin (VITAMIN B 12 PO) Take by mouth.    . cyclobenzaprine (FLEXERIL) 10 MG tablet Take 1 tablet (10 mg total) by mouth 3 (three) times daily as needed for muscle spasms. 30 tablet 0  . escitalopram (LEXAPRO) 10 MG tablet Take 1 tablet (10 mg total) by mouth at bedtime. 30 tablet 3  . hydrochlorothiazide (HYDRODIURIL) 25 MG tablet TAKE 1 TABLET BY MOUTH EVERY DAY 30 tablet 3  . HYDROcodone-acetaminophen (NORCO) 7.5-325 MG tablet Take 1 tablet by mouth every 6 (six) hours as needed for moderate pain. 45 tablet 0  . hydrOXYzine (VISTARIL) 25 MG capsule Take 1 capsule (25 mg total) by mouth 2 (two) times daily as needed. 60 capsule 1  . LORazepam (ATIVAN) 0.5 MG tablet Take 1 tablet (0.5 mg total) by mouth at bedtime as needed for anxiety. 30 tablet 1  . vitamin C (ASCORBIC ACID) 500 MG tablet Take 500 mg by mouth daily.    . Vitamin D, Cholecalciferol, 1000 UNITS CAPS Take by mouth.    . naproxen (NAPROSYN) 500 MG tablet Take 1 tablet (500 mg total) by mouth 2 (two) times daily as needed for mild pain, moderate pain or headache (TAKE WITH MEALS.). (Patient not taking: Reported on 07/10/2016) 60 tablet 2   No current facility-administered medications for this visit.      Musculoskeletal: Strength & Muscle Tone: within normal limits Gait & Station: broad based Patient leans: Front  Psychiatric Specialty Exam: Review of Systems  Gastrointestinal: Negative for abdominal pain, heartburn, nausea and vomiting.  Musculoskeletal: Positive for back pain, joint pain and myalgias. Negative for falls and neck pain.  Neurological: Positive for sensory change and headaches. Negative for dizziness, tremors, seizures and loss of consciousness.  Psychiatric/Behavioral: Positive for depression.    Blood pressure 128/74, pulse (!) 101, height 5'  8" (1.727 m), weight 219 lb 6.4 oz (99.5 kg).Body mass index is 33.36 kg/m.  General Appearance: Fairly Groomed  Eye Contact:  Good  Speech:  Clear and Coherent and Normal Rate  Volume:  Normal  Mood:  Anxious and Depressed  Affect:  Congruent and Tearful  Thought Process:  Goal Directed  Orientation:  Full (Time, Place, and Person)  Thought Content: Logical   Suicidal Thoughts:  No  Homicidal Thoughts:  No  Memory:  Immediate;   Good Recent;   Good Remote;   Good  Judgement:  Fair  Insight:  Fair  Psychomotor Activity:  Decreased  Concentration:  Concentration: Good and Attention Span: Good  Recall:  Dale of Knowledge: Good  Language: Good  Akathisia:  No  Handed:  Right  AIMS (if indicated):  n/a  Assets:  Communication Skills Desire for [place] Talents/Skills Transportation  ADL's:  Intact  Cognition: WNL  Sleep:  poor     Treatment Plan Summary:Medication management and Plan see below  Assessment: MDD- recurrent, moderate; PTSD   Medication management with supportive therapy. Risks/benefits and SE of the medication discussed. Pt verbalized understanding and verbal consent obtained for treatment.  Affirm with the patient that the medications are taken as ordered. Patient expressed understanding of how their medications were to be used.  Meds: Increase Lexapro 20mg  po qD for mood and PTSD Vistaril 25mg  po BID prn anxiety Ativan as prescribed by PCP   Labs: none today   Therapy: brief supportive therapy provided. Discussed psychosocial stressors in detail.   Discussed grief therapy Encouraged pt to develop daily routine and work on daily goal setting as a way to improve mood symptoms.  Reviewed sleep hygiene in detail  Consultations: encouraged to continue individual and group therapy  Pt denies SI and is at an acute low risk for suicide. Patient told to call clinic if any problems occur. Patient advised to go to  ER if they should develop SI/HI, side effects, or if symptoms worsen. Has crisis numbers to call if needed. Pt verbalized understanding.  F/up in 2 months or sooner if needed   Charlcie Cradle, MD 07/10/2016, 9:29 AM

## 2016-07-11 ENCOUNTER — Encounter (HOSPITAL_COMMUNITY): Payer: Self-pay | Admitting: Licensed Clinical Social Worker

## 2016-07-11 ENCOUNTER — Ambulatory Visit (HOSPITAL_COMMUNITY): Payer: Self-pay | Admitting: Licensed Clinical Social Worker

## 2016-07-11 NOTE — Progress Notes (Signed)
Daily Group Progress Note Program:  Outpatient  Group Time: 5:30-6:30 pm  Participation Level: Active  Behavioral Response: Appropriate  Type of Therapy:  Psychoeducation/Therapy  Summary of Progress: Pt participated in a discussion on "Staying focused while in mental health recovery."  Pt chose recovery and set goals to strive for; however, she has encountered obstacles that have tried to interfere with her road to recovery. Pt was encouraged to stay focused on her recovery even amidst hardships and hindrances. Pt was open to suggestions during the intervention. [name], LCAS

## 2016-07-19 ENCOUNTER — Ambulatory Visit (INDEPENDENT_AMBULATORY_CARE_PROVIDER_SITE_OTHER): Payer: 59 | Admitting: Licensed Clinical Social Worker

## 2016-07-19 ENCOUNTER — Encounter (HOSPITAL_COMMUNITY): Payer: Self-pay | Admitting: Licensed Clinical Social Worker

## 2016-07-19 DIAGNOSIS — F4310 Post-traumatic stress disorder, unspecified: Secondary | ICD-10-CM

## 2016-07-19 DIAGNOSIS — F331 Major depressive disorder, recurrent, moderate: Secondary | ICD-10-CM

## 2016-07-19 NOTE — Progress Notes (Signed)
   THERAPIST PROGRESS NOTE  Session Time: 1:45 -:2:30pm  Participation Level: Active  Behavioral Response: CasualAlertDepressed  Type of Therapy: Individual Therapy  Treatment Goals addressed: Coping  Interventions: Supportive and Reframing  Summary: Melody Jenkins is a 51 y.o. female who presents with depression and grief. Pt wanted to be seen individually this week because she felt she needed an additional session. Pt is still in considerable pain and has been written out of work again by her neurosurgeon.Pt is still considering back surgery but has fear and a lot of unanswered questions. Pts dog just died unexpectantly and now she is grieving him as well as her fiance. . Again, processed with pt the grief process and how it's not linear and she may go back and forth between stages. Pt reports she has good days and bad days. She still hasn't slept in their bed. Processed with pt taking it slowly moving back to the master bedroom bed. Processed with pt the feelings of depression and how to move forward with her depression using her coping skills. Suggested to pt to continue coming to the OP evening group.  Pt was receptive to suggestions and intervention.  Suicidal/Homicidal: Nowithout intent/plan  Therapist Response: Assessed pt's current functioning for dealing with her stressors. Assisted pt in processing grief support, depressive symptoms, chronic pain and processing for management of stressors.  Plan: Evening OP group and individual sessions as needed  Diagnosis: Axis I: F43.23, F43.20        [place], LCAS 07/19/2016

## 2016-08-01 ENCOUNTER — Other Ambulatory Visit: Payer: Self-pay | Admitting: Family Medicine

## 2016-08-21 ENCOUNTER — Ambulatory Visit (INDEPENDENT_AMBULATORY_CARE_PROVIDER_SITE_OTHER): Payer: 59 | Admitting: Licensed Clinical Social Worker

## 2016-08-21 DIAGNOSIS — F331 Major depressive disorder, recurrent, moderate: Secondary | ICD-10-CM

## 2016-08-21 DIAGNOSIS — F4323 Adjustment disorder with mixed anxiety and depressed mood: Secondary | ICD-10-CM

## 2016-08-23 ENCOUNTER — Encounter (HOSPITAL_COMMUNITY): Payer: Self-pay | Admitting: Licensed Clinical Social Worker

## 2016-08-23 NOTE — Progress Notes (Signed)
Daily Group Progress Note     Program: OP   Group Time: 5:30-6:30  Participation Level: Active  Behavioral Response: Appropriate  Type of Therapy:  Psychoeducation/Therapy  Summary of Progress: Pt participated in a discussion on "refection of progress made in outpatient group." Pt is completing the program today and will continue with individual therapy. Pt shared how much she learned about herself, putting herself first, coping tools, asking for help, and meeting other group members who have the same issues. She was grateful for the opportunity to be a part of a successful support and psychoeducational therapy group. She has made friends with the like-minded members of the group and has learned many coping tools to continue in her mental wellness recovery.  [name], LCAS

## 2016-08-27 ENCOUNTER — Ambulatory Visit (HOSPITAL_COMMUNITY): Payer: Self-pay | Admitting: Licensed Clinical Social Worker

## 2016-09-06 ENCOUNTER — Ambulatory Visit (INDEPENDENT_AMBULATORY_CARE_PROVIDER_SITE_OTHER): Payer: 59 | Admitting: Psychiatry

## 2016-09-06 ENCOUNTER — Encounter (HOSPITAL_COMMUNITY): Payer: Self-pay | Admitting: Psychiatry

## 2016-09-06 DIAGNOSIS — F331 Major depressive disorder, recurrent, moderate: Secondary | ICD-10-CM | POA: Diagnosis not present

## 2016-09-06 DIAGNOSIS — Z79899 Other long term (current) drug therapy: Secondary | ICD-10-CM | POA: Diagnosis not present

## 2016-09-06 DIAGNOSIS — Z634 Disappearance and death of family member: Secondary | ICD-10-CM

## 2016-09-06 DIAGNOSIS — F4310 Post-traumatic stress disorder, unspecified: Secondary | ICD-10-CM

## 2016-09-06 DIAGNOSIS — F17210 Nicotine dependence, cigarettes, uncomplicated: Secondary | ICD-10-CM

## 2016-09-06 MED ORDER — HYDROXYZINE PAMOATE 25 MG PO CAPS: 25.0000 mg | ORAL_CAPSULE | Freq: Two times a day (BID) | ORAL | 1 refills | Status: DC | PRN

## 2016-09-06 MED ORDER — VENLAFAXINE HCL ER 75 MG PO CP24: 75.0000 mg | ORAL_CAPSULE | Freq: Every day | ORAL | 1 refills | Status: DC

## 2016-09-06 NOTE — Progress Notes (Signed)
[facility] MD/PA/NP OP Progress Note  09/06/2016 2:33 PM MAILEY LANDSTROM  MRN:  [id number]  Chief Complaint:  Chief Complaint    Follow-up      HPI: Pt states the other day she had a "breakdown". She is overwhelmed with trying to get disability and her ongoing pain.   Pt is having significant financial difficulties. Her dog passed away in 08-13-2022.   Pt spends a lot of time looking at pics of her deceased fiance. States she can't believe he is gone. Depression is unchanged. Pt reports isolation and crying spells. 2 days ago she had passive SI without plan or intent. Today denies SI/HI.   Sleep is poor and she is getting about 3-4 hrs/night. Energy is low but she is not napping during the day.   PTSD- pt feels no one is listening or helping.   Pt missed several appts with her therapist and notes symptoms got much worse.   Taking meds as prescribed and denies SE.    Visit Diagnosis:    ICD-9-CM ICD-10-CM   1. PTSD (post-traumatic stress disorder) 309.81 F43.10 hydrOXYzine (VISTARIL) 25 MG capsule     venlafaxine XR (EFFEXOR XR) 75 MG 24 hr capsule  2. Moderate episode of recurrent major depressive disorder (HCC) 296.32 F33.1 hydrOXYzine (VISTARIL) 25 MG capsule     venlafaxine XR (EFFEXOR XR) 75 MG 24 hr capsule    Past Psychiatric History: see H&P  Past Medical History:  Past Medical History:  Diagnosis Date  . Anemia   . Anxiety   . Asthma   . Depression   . History of multiple miscarriages   . Hyperlipidemia     Past Surgical History:  Procedure Laterality Date  . BREAST SURGERY  2000   reduction  . COSMETIC SURGERY  2000   breast reduction    Family Psychiatric History:  Family History  Problem Relation Age of Onset  . Diabetes Mother   . Hypertension Mother   . Hypertension Father   . Heart disease Father   . Early death Brother   . Kidney disease Maternal Grandmother   . Heart disease Brother     Social History:  Social History   Social History  . Marital  status: Single    Spouse name: N/A  . Number of children: N/A  . Years of education: N/A   Social History Main Topics  . Smoking status: Current Every Day Smoker    Packs/day: 0.25    Types: Cigarettes  . Smokeless tobacco: Never Used     Comment: Has cut back to 2 a day and trying to quit  . Alcohol use No  . Drug use: No  . Sexual activity: Not Currently    Birth control/ protection: None   Other Topics Concern  . None   Social History Narrative  . None    Allergies:  Allergies  Allergen Reactions  . Trazodone And Nefazodone     Metabolic Disorder Labs: Lab Results  Component Value Date   HGBA1C 5.4 04/06/2016   MPG 108 04/06/2016   No results found for: PROLACTIN Lab Results  Component Value Date   CHOL 180 03/05/2016   TRIG 145 03/05/2016   HDL 30 (L) 03/05/2016   CHOLHDL 6.0 (H) 03/05/2016   VLDL 29 03/05/2016   LDLCALC 121 03/05/2016   LDLCALC 113 04/06/2015     Current Medications: Current Outpatient Prescriptions  Medication Sig Dispense Refill  . acetaminophen (TYLENOL) 500 MG tablet Take 500 mg by  mouth every 4 (four) hours as needed for moderate pain.    Marland Kitchen albuterol (PROVENTIL HFA;VENTOLIN HFA) 108 (90 BASE) MCG/ACT inhaler Inhale 2 puffs into the lungs every 6 (six) hours as needed for wheezing or shortness of breath.    . Biotin 10 MG CAPS Take by mouth.    . Cyanocobalamin (VITAMIN B 12 PO) Take by mouth.    . cyclobenzaprine (FLEXERIL) 10 MG tablet Take 1 tablet (10 mg total) by mouth 3 (three) times daily as needed for muscle spasms. 30 tablet 0  . escitalopram (LEXAPRO) 10 MG tablet TAKE 1 TABLET BY MOUTH AT BEDTIME 30 tablet 3  . escitalopram (LEXAPRO) 20 MG tablet Take 1 tablet (20 mg total) by mouth daily. 30 tablet 2  . hydrochlorothiazide (HYDRODIURIL) 25 MG tablet TAKE 1 TABLET BY MOUTH EVERY DAY 30 tablet 3  . hydrOXYzine (VISTARIL) 25 MG capsule Take 1 capsule (25 mg total) by mouth 2 (two) times daily as needed. 60 capsule 2  .  meloxicam (MOBIC) 15 MG tablet Take 15 mg by mouth 2 (two) times daily.    . vitamin C (ASCORBIC ACID) 500 MG tablet Take 500 mg by mouth daily.    . Vitamin D, Cholecalciferol, 1000 UNITS CAPS Take by mouth.    Marland Kitchen HYDROcodone-acetaminophen (NORCO) 7.5-325 MG tablet Take 1 tablet by mouth every 6 (six) hours as needed for moderate pain. (Patient not taking: Reported on 09/06/2016) 45 tablet 0  . LORazepam (ATIVAN) 0.5 MG tablet Take 1 tablet (0.5 mg total) by mouth at bedtime as needed for anxiety. (Patient not taking: Reported on 09/06/2016) 30 tablet 1  . naproxen (NAPROSYN) 500 MG tablet Take 1 tablet (500 mg total) by mouth 2 (two) times daily as needed for mild pain, moderate pain or headache (TAKE WITH MEALS.). (Patient not taking: Reported on 07/10/2016) 60 tablet 2   No current facility-administered medications for this visit.     Musculoskeletal: Strength & Muscle Tone: within normal limits Gait & Station: normal Patient leans: N/A  Psychiatric Specialty Exam: Review of Systems  Musculoskeletal: Positive for back pain. Negative for joint pain and neck pain.  Neurological: Positive for sensory change and headaches. Negative for dizziness, tremors, seizures and loss of consciousness.  Psychiatric/Behavioral: Positive for depression. Negative for hallucinations, substance abuse and suicidal ideas. The patient is nervous/anxious and has insomnia.     Blood pressure 128/80, pulse 99, height 5' 7.5" (1.715 m), weight 218 lb (98.9 kg).Body mass index is 33.64 kg/m.  General Appearance: Casual  Eye Contact:  Good  Speech:  Clear and Coherent and Normal Rate  Volume:  Normal  Mood:  Anxious and Depressed  Affect:  Congruent, Depressed and Tearful  Thought Process:  Coherent and Descriptions of Associations: Circumstantial  Orientation:  Full (Time, Place, and Person)  Thought Content: Rumination   Suicidal Thoughts:  No  Homicidal Thoughts:  No  Memory:  Immediate;   Fair Recent;    Fair Remote;   Fair  Judgement:  Fair  Insight:  Fair  Psychomotor Activity:  Normal  Concentration:  Concentration: Good and Attention Span: Good  Recall:  [place] of Knowledge: Good  Language: Good  Akathisia:  No  Handed:  Right  AIMS (if indicated):  N/a  Assets:  Communication Skills Desire for Improvement  ADL's:  Intact  Cognition: WNL  Sleep:  poor     Treatment Plan Summary:Medication management and Plan see below  Assessment: MDD- recurrent, moderate; PTSD; Complicated bereavement  Medication management with supportive therapy. Risks/benefits and SE of the medication discussed. Pt verbalized understanding and verbal consent obtained for treatment.  Affirm with the patient that the medications are taken as ordered. Patient expressed understanding of how their medications were to be used.    Meds: d/c Lexapro Continue Vistaril 25mg  po BID prn anxiety  Start trial of Effexor XR 75mg  po qD for depression and PTSD Ativan as prescribed by PCP   Labs: none   Therapy: brief supportive therapy provided. Discussed psychosocial stressors in detail.   Encouraged pt to develop daily routine and work on daily goal setting as a way to improve mood symptoms.    Consultations:  Encouraged to continue therapy  Pt denies SI and is at an acute low risk for suicide. Patient told to call clinic if any problems occur. Patient advised to go to ER if they should develop SI/HI, side effects, or if symptoms worsen. Has crisis numbers to call if needed. Pt verbalized understanding.  F/up in 2 months or sooner if needed   Charlcie Cradle, MD 09/06/2016, 2:33 PM

## 2016-09-13 ENCOUNTER — Ambulatory Visit (INDEPENDENT_AMBULATORY_CARE_PROVIDER_SITE_OTHER): Payer: 59 | Admitting: Licensed Clinical Social Worker

## 2016-09-13 DIAGNOSIS — F4321 Adjustment disorder with depressed mood: Secondary | ICD-10-CM

## 2016-09-13 DIAGNOSIS — F4320 Adjustment disorder, unspecified: Secondary | ICD-10-CM

## 2016-09-13 DIAGNOSIS — F4310 Post-traumatic stress disorder, unspecified: Secondary | ICD-10-CM | POA: Diagnosis not present

## 2016-09-19 ENCOUNTER — Telehealth (HOSPITAL_COMMUNITY): Payer: Self-pay

## 2016-09-19 NOTE — Telephone Encounter (Signed)
Medication problem - Attempted to reach patient back after she left a message she has not been feeling as well since stopped Lexapro and is now taking Ativan, Hydroxyzine, and also Tramadol and other medications for pain.  States she feels more "down" and is having some "bad dreams".  Patient wanted to make sure she was taking what Dr. Doyne Keel wanted her taking and if all her current medications could be causing her symptoms of feeling more down and not "herself".  Left message for patient to call us back to discuss further and that Dr.Agarwal would be in the office on 09/20/16.

## 2016-09-20 ENCOUNTER — Encounter (HOSPITAL_COMMUNITY): Payer: Self-pay | Admitting: Licensed Clinical Social Worker

## 2016-09-20 NOTE — Progress Notes (Signed)
   THERAPIST PROGRESS NOTE  Session Time: 3:15-4pm  Participation Level: Active  Behavioral Response: CasualAlertDepressed  Type of Therapy: Individual Therapy  Treatment Goals addressed: Coping  Interventions: Supportive and Reframing  Summary: Melody Jenkins is a 51 y.o. female who presents with depression and grief. Pt is struggling with her chronic back pain. She continues with her back dr and pain management clinic. She is frustrated because there is no relief. She is continuing to struggle with her std not paying and has had to borrow money from everyone  To pay her bills. Pt still continues to grieve the loss of her fiance. She went to his [place] today. This is the first time she has gone. She said it was easier than she thought it would be. Again prohe may go back and forth between stages. Pt reports she has good days and bad days. She still hasn't slept in their bed. Processed with pt taking it slowly moving back to the master bedroom bed. Processed with pt the feelings of depression and how to move forward with her depression using her coping skills. Suggested to pt to continue coming to the OP evening group.  Pt was receptive to suggestions and intervention.  Suicidal/Homicidal: Nowithout intent/plan  Therapist Response: Assessed pt's current functioning for dealing with her stressors. Assisted pt in processing grief support, depressive symptoms, chronic pain and processing for management of stressors.  Plan: Evening OP group and individual sessions as needed  Diagnosis: Axis I: F43.23, F43.20        Koraline Phillipson S, LCAS 09/20/2016

## 2016-09-20 NOTE — Telephone Encounter (Signed)
Spoke with pt who states she is feeling better today. Pt stopped Lexapro about one week ago. She has been taking Ativan and Effexor as prescribed. When she woke up yesterday she didn't feel well because she had a bad dream that night. Pt spoke with her therapist yesterday who suggested she speak with nurse. Pt is taking Tramadol and other meds. In the mornings she struggles to talk. She doesn't feel bad and by the afternoon she is better.

## 2016-09-25 ENCOUNTER — Ambulatory Visit (INDEPENDENT_AMBULATORY_CARE_PROVIDER_SITE_OTHER): Payer: 59 | Admitting: Licensed Clinical Social Worker

## 2016-09-25 DIAGNOSIS — F4320 Adjustment disorder, unspecified: Secondary | ICD-10-CM

## 2016-09-25 DIAGNOSIS — F331 Major depressive disorder, recurrent, moderate: Secondary | ICD-10-CM | POA: Diagnosis not present

## 2016-09-25 DIAGNOSIS — F4321 Adjustment disorder with depressed mood: Secondary | ICD-10-CM

## 2016-09-26 ENCOUNTER — Other Ambulatory Visit: Payer: Self-pay | Admitting: Family Medicine

## 2016-09-26 DIAGNOSIS — Z1231 Encounter for screening mammogram for malignant neoplasm of breast: Secondary | ICD-10-CM

## 2016-09-27 ENCOUNTER — Encounter (HOSPITAL_COMMUNITY): Payer: Self-pay | Admitting: Licensed Clinical Social Worker

## 2016-09-27 NOTE — Progress Notes (Signed)
Daily Group Progress Note Program:  OP Evening Group   Group Time: 5:30-6:30  Participation Level: Active  Behavioral Response: Appropriate  Type of Therapy:  Psychoeducation/Therapy  Summary of Progress:  Pt watched Ted Talk "The Power of Vulnerability," Almond Lint. Pt participated in a discussion on the fear of vulnerability sharing how her fear of vulnerability affects her life, how connection gives the courage to be imperfect, vulnerable. Pt was active during the intervention.  [name], LCAS

## 2016-10-01 ENCOUNTER — Ambulatory Visit (INDEPENDENT_AMBULATORY_CARE_PROVIDER_SITE_OTHER): Payer: 59 | Admitting: Licensed Clinical Social Worker

## 2016-10-01 DIAGNOSIS — F4320 Adjustment disorder, unspecified: Secondary | ICD-10-CM

## 2016-10-01 DIAGNOSIS — F4321 Adjustment disorder with depressed mood: Secondary | ICD-10-CM

## 2016-10-01 DIAGNOSIS — F331 Major depressive disorder, recurrent, moderate: Secondary | ICD-10-CM

## 2016-10-02 ENCOUNTER — Ambulatory Visit (INDEPENDENT_AMBULATORY_CARE_PROVIDER_SITE_OTHER): Payer: 59 | Admitting: Licensed Clinical Social Worker

## 2016-10-02 DIAGNOSIS — F4310 Post-traumatic stress disorder, unspecified: Secondary | ICD-10-CM

## 2016-10-02 DIAGNOSIS — F331 Major depressive disorder, recurrent, moderate: Secondary | ICD-10-CM

## 2016-10-04 ENCOUNTER — Encounter (HOSPITAL_COMMUNITY): Payer: Self-pay | Admitting: Licensed Clinical Social Worker

## 2016-10-04 ENCOUNTER — Encounter: Payer: Self-pay | Admitting: Family Medicine

## 2016-10-04 NOTE — Progress Notes (Signed)
Daily Group Progress Note Program:  OP Evening Group   Group Time: 5:30-6:30  Participation Level: Active  Behavioral Response: Appropriate  Type of Therapy:  Psychoeducation/Therapy Summary of Progress: Pt participated in a discussion on effective coping skills for her anxiety and depression. Pt shared with the group the coping skills that work for her: deep breathing and meditation. These seemed to be helpful suggestions to other members of the group. Pt was encouraged to continue using her coping skills that she has found effective and to try new ones she learned today. Pt was receptive to suggestions and intervention.  [name], LCAS

## 2016-10-04 NOTE — Progress Notes (Signed)
   THERAPIST PROGRESS NOTE  Session Time: 3:15-4pm  Participation Level: Active  Behavioral Response: CasualAlertDepressed  Type of Therapy: Individual Therapy  Treatment Goals addressed: Coping  Interventions: Supportive and Reframing  Summary: Melody Jenkins is a 51 y.o. female who presents with depression and grief. Pt is struggling with her chronic back pain. She continues with her back dr and pain management clinic. She is frustrated because there is no relief. She is now filling for long term disability which will assist in paying her bills. Pt still grieves the death of her fiance. Process this with pt.  Processed with pt the feelings of depression and how to move forward with her depression using her coping skills. Processed with pt her "new normal" and what that means. Suggested to pt to continue coming to the OP evening group.  Pt was receptive to suggestions and intervention.  Suicidal/Homicidal: Nowithout intent/plan  Therapist Response: Assessed pt's current functioning for dealing with her stressors. Assisted pt in processing grief support, depressive symptoms and moving forward with her life, chronic pain and processing for management of stressors.  Plan: Evening OP group and individual sessions as needed  Diagnosis: Axis I: F43.23, F43.20        Tejah Brekke S, LCAS 10/01/16

## 2016-10-09 ENCOUNTER — Ambulatory Visit (INDEPENDENT_AMBULATORY_CARE_PROVIDER_SITE_OTHER): Payer: 59 | Admitting: Licensed Clinical Social Worker

## 2016-10-09 DIAGNOSIS — F4310 Post-traumatic stress disorder, unspecified: Secondary | ICD-10-CM | POA: Diagnosis not present

## 2016-10-10 ENCOUNTER — Encounter (HOSPITAL_COMMUNITY): Payer: Self-pay | Admitting: Licensed Clinical Social Worker

## 2016-10-10 NOTE — Progress Notes (Signed)
Daily Group Progress Note Program:  Outpatient  Group Time: 5:30-6:30 pm  Participation Level: Active  Behavioral Response: Appropriate  Type of Therapy:  Psychoeducation/Therapy  Summary of Progress: Pt participated in a discussion on childhood experiences affecting adult mental wellness. Discussion ensued on how survivors of negative childhood experiences often tend to develop unhealthy coping skills and difficulty regulating emotions, which effect adult mental wellness. Pt was encouraged to continue using positive coping skills, use mindfulness practice and develop a positive support system. Pt actively engaged during the intervention and was receptive to suggestions and feedback.   [name], LCAS

## 2016-10-16 ENCOUNTER — Ambulatory Visit (INDEPENDENT_AMBULATORY_CARE_PROVIDER_SITE_OTHER): Payer: 59 | Admitting: Licensed Clinical Social Worker

## 2016-10-16 DIAGNOSIS — F4310 Post-traumatic stress disorder, unspecified: Secondary | ICD-10-CM

## 2016-10-16 DIAGNOSIS — F331 Major depressive disorder, recurrent, moderate: Secondary | ICD-10-CM

## 2016-10-17 ENCOUNTER — Encounter (HOSPITAL_COMMUNITY): Payer: Self-pay | Admitting: Licensed Clinical Social Worker

## 2016-10-17 NOTE — Progress Notes (Signed)
Daily Group Progress Note Program:  OP Evening Group   Group Time: 5:30-6:30  Participation Level: Active  Behavioral Response: Appropriate  Type of Therapy:  Psychoeducation/Therapy  Summary of Progress:  Pt participated in check-in, describing psychiatric symptoms and current life events. Pt participated in a discussion on co-dependency, traits and recovery.  Pt was encouraged to use her tools of recovery to develop and maintain healthy relationships.  Raena Pau S. Camran Keady, LCAS 

## 2016-10-23 ENCOUNTER — Encounter (HOSPITAL_COMMUNITY): Payer: Self-pay | Admitting: Licensed Clinical Social Worker

## 2016-10-23 ENCOUNTER — Ambulatory Visit (INDEPENDENT_AMBULATORY_CARE_PROVIDER_SITE_OTHER): Payer: 59 | Admitting: Licensed Clinical Social Worker

## 2016-10-23 ENCOUNTER — Ambulatory Visit (HOSPITAL_COMMUNITY): Payer: Self-pay | Admitting: Licensed Clinical Social Worker

## 2016-10-23 ENCOUNTER — Telehealth (HOSPITAL_COMMUNITY): Payer: Self-pay

## 2016-10-23 DIAGNOSIS — F4310 Post-traumatic stress disorder, unspecified: Secondary | ICD-10-CM

## 2016-10-23 NOTE — Telephone Encounter (Signed)
Patient was here to see therapist Beth. She states that she sees a pain medication doctor that has her on Tramadol. Patient currently takes hydroxyzine and Effexor, she says that they are working well (the Vistaril gives her hot flashes, but they pass) and she does not want to change, but her pain management doctor (Dr. Brien Few at [facility]) will not change her pain medication due to an interaction in the medications. The Tramadol is last ing between 2-3 hours and then patient takes tylenol in between and she is now concerned about her liver. Please review and advise, would you be willing to speak to Dr. Brien Few and maybe come up with something that will work for the paitent?

## 2016-10-23 NOTE — Progress Notes (Signed)
   THERAPIST PROGRESS NOTE  Session Time: 1:15-2pm  Participation Level: Active  Behavioral Response: CasualAlertDepressed  Type of Therapy: Individual Therapy  Treatment Goals addressed: Coping  Interventions: Supportive and Reframing  Summary: Melody Jenkins is a 51 y.o. female who presents for her individual session with depression and grief. Pt discussed her psychiatric symptoms and current life events.  Pt is having dreams about her fiance that passed away. She was tearful in her description. She is continuing to have chronic back pain. She is struggling with her medication and asked to speak with CMA Regan. Her pain management clinic had some questions about her psychotropic medications. Suggested to pt the necessity to complete a daily schedule. She was open to the idea to have a full day. Discussed basic CPT concepts and the thought emotion connection.     .  Suicidal/Homicidal: Nowithout intent/plan  Therapist Response: Assessed pt's current functioning and reviewed progress. Assisted pt  processing depressive symptoms and completing a daily schedule,, chronic pain and processing for management of stressors.  Plan: Evening OP group and individual sessions as needed  Diagnosis: Axis I: F43.23, F43.20        MACKENZIE,LISBETH S, LCAS 10/23/16

## 2016-10-30 ENCOUNTER — Ambulatory Visit (INDEPENDENT_AMBULATORY_CARE_PROVIDER_SITE_OTHER): Payer: 59 | Admitting: Licensed Clinical Social Worker

## 2016-10-30 DIAGNOSIS — F331 Major depressive disorder, recurrent, moderate: Secondary | ICD-10-CM | POA: Diagnosis not present

## 2016-10-31 ENCOUNTER — Encounter (HOSPITAL_COMMUNITY): Payer: Self-pay | Admitting: Licensed Clinical Social Worker

## 2016-10-31 NOTE — Progress Notes (Signed)
Daily Group Progress Note Program:  Outpatient Group Time: 5:30-6:30pm  Participation Level: Active  Behavioral Response: Appropriate  Type of Therapy:  Psychoeducation/Therapy  Summary of Progress: Pt participated in a discussion on acceptance. Pt talked about the stigma of mental illness, how somehow she could control it "if she only tried," or "it's just a phase you're going through." Pt was encouraged to encourage equality when speaking about mental illness and physical illness when talking about her disease.  Lisbeth S. Mackenzie, LCAS 

## 2016-11-01 NOTE — Telephone Encounter (Signed)
He is probably concerned about Serotonin syndrome with use of Effexor. Does the patient want to try another antidepressant (Remeron or Wellbutrin)?

## 2016-11-02 NOTE — Telephone Encounter (Signed)
I called patient and she said that she tried Wellbutrin before, but it has been many years and she does not remember if it worked - She has never tried Remeron. Patient had the Genesight test done, if you would like to look at it let me know. Please review and advise, thank you

## 2016-11-05 ENCOUNTER — Other Ambulatory Visit: Payer: Self-pay | Admitting: Family Medicine

## 2016-11-05 ENCOUNTER — Other Ambulatory Visit (HOSPITAL_COMMUNITY): Payer: Self-pay | Admitting: Psychiatry

## 2016-11-05 DIAGNOSIS — F331 Major depressive disorder, recurrent, moderate: Secondary | ICD-10-CM

## 2016-11-05 DIAGNOSIS — F4310 Post-traumatic stress disorder, unspecified: Secondary | ICD-10-CM

## 2016-11-06 ENCOUNTER — Ambulatory Visit (INDEPENDENT_AMBULATORY_CARE_PROVIDER_SITE_OTHER): Payer: 59 | Admitting: Licensed Clinical Social Worker

## 2016-11-06 DIAGNOSIS — F4310 Post-traumatic stress disorder, unspecified: Secondary | ICD-10-CM | POA: Diagnosis not present

## 2016-11-07 ENCOUNTER — Encounter: Payer: Self-pay | Admitting: Physician Assistant

## 2016-11-07 ENCOUNTER — Ambulatory Visit (INDEPENDENT_AMBULATORY_CARE_PROVIDER_SITE_OTHER): Payer: 59 | Admitting: Physician Assistant

## 2016-11-07 ENCOUNTER — Encounter: Payer: Self-pay | Admitting: Gastroenterology

## 2016-11-07 ENCOUNTER — Encounter (HOSPITAL_COMMUNITY): Payer: Self-pay | Admitting: Licensed Clinical Social Worker

## 2016-11-07 VITALS — BP 142/86 | HR 98 | Temp 98.1°F | Resp 16 | Wt 225.8 lb

## 2016-11-07 DIAGNOSIS — Z1212 Encounter for screening for malignant neoplasm of rectum: Secondary | ICD-10-CM | POA: Diagnosis not present

## 2016-11-07 DIAGNOSIS — Z1211 Encounter for screening for malignant neoplasm of colon: Secondary | ICD-10-CM

## 2016-11-07 DIAGNOSIS — N951 Menopausal and female climacteric states: Secondary | ICD-10-CM | POA: Diagnosis not present

## 2016-11-07 LAB — TSH: TSH: 0.83 m[IU]/L

## 2016-11-07 MED ORDER — HYDROCHLOROTHIAZIDE 25 MG PO TABS: 25.0000 mg | ORAL_TABLET | Freq: Every day | ORAL | 3 refills | Status: DC

## 2016-11-07 NOTE — Progress Notes (Signed)
Patient ID: Melody Jenkins MRN: [id number], DOB: 1966/02/18, 51 y.o. Date of Encounter: 11/07/2016, 10:47 AM    Chief Complaint:  Chief Complaint  Patient presents with  . discuss health problems     HPI: 51 y.o. year old female presents with above.   She states that she is scheduled for physical with Dr. Buelah Manis in June. Says that she was trying to get her colonoscopy scheduled to have prior to that physical. Says that when she was on the patient portal and was trying to schedule colonoscopy, this appointment got scheduled.  Says that she also wants to address these sweats that she is having while she is here today. Says that her psychiatrist is wondering whether these episodic sweats are coming from perimenopause. Says that they had recently changed her depression medicine and doesn't know if the sweats are coming from that medicine or menopause. Has episodes throughout the day where she will just suddenly start sweating. Has had no fever.  No other concerns to address today. Is returning for physical with Dr. Buelah Manis in the near future.     Home Meds:   Outpatient Medications Prior to Visit  Medication Sig Dispense Refill  . acetaminophen (TYLENOL) 500 MG tablet Take 500 mg by mouth every 4 (four) hours as needed for moderate pain.    Marland Kitchen albuterol (PROVENTIL HFA;VENTOLIN HFA) 108 (90 BASE) MCG/ACT inhaler Inhale 2 puffs into the lungs every 6 (six) hours as needed for wheezing or shortness of breath.    . Cyanocobalamin (VITAMIN B 12 PO) Take by mouth.    . cyclobenzaprine (FLEXERIL) 10 MG tablet Take 1 tablet (10 mg total) by mouth 3 (three) times daily as needed for muscle spasms. 30 tablet 0  . meloxicam (MOBIC) 15 MG tablet Take 15 mg by mouth 2 (two) times daily.    Marland Kitchen venlafaxine XR (EFFEXOR XR) 75 MG 24 hr capsule Take 1 capsule (75 mg total) by mouth daily. 30 capsule 1  . vitamin C (ASCORBIC ACID) 500 MG tablet Take 500 mg by mouth daily.    . Vitamin D, Cholecalciferol,  1000 UNITS CAPS Take 2 capsules by mouth.     . hydrochlorothiazide (HYDRODIURIL) 25 MG tablet TAKE 1 TABLET BY MOUTH EVERY DAY 30 tablet 3  . Biotin 10 MG CAPS Take by mouth.    Marland Kitchen HYDROcodone-acetaminophen (NORCO) 7.5-325 MG tablet Take 1 tablet by mouth every 6 (six) hours as needed for moderate pain. (Patient not taking: Reported on 09/06/2016) 45 tablet 0  . hydrOXYzine (VISTARIL) 25 MG capsule Take 1 capsule (25 mg total) by mouth 2 (two) times daily as needed. 60 capsule 1  . LORazepam (ATIVAN) 0.5 MG tablet Take 1 tablet (0.5 mg total) by mouth at bedtime as needed for anxiety. (Patient not taking: Reported on 09/06/2016) 30 tablet 1  . naproxen (NAPROSYN) 500 MG tablet Take 1 tablet (500 mg total) by mouth 2 (two) times daily as needed for mild pain, moderate pain or headache (TAKE WITH MEALS.). (Patient not taking: Reported on 07/10/2016) 60 tablet 2   No facility-administered medications prior to visit.     Allergies:  Allergies  Allergen Reactions  . Trazodone And Nefazodone       Review of Systems: See HPI for pertinent ROS. All other ROS negative.    Physical Exam: Blood pressure (!) 142/86, pulse 98, temperature 98.1 F (36.7 C), temperature source Oral, resp. rate 16, weight 225 lb 12.8 oz (102.4 kg), SpO2 98 %., Body  mass index is 34.84 kg/m. General:  AAF. Appears in no acute distress. Neck: Supple. No thyromegaly. No lymphadenopathy. Lungs: Clear bilaterally to auscultation without wheezes, rales, or rhonchi. Breathing is unlabored. Heart: Regular rhythm. No murmurs, rubs, or gallops. Msk:  Strength and tone normal for age. Extremities/Skin: Warm and dry.  Neuro: Alert and oriented X 3. Moves all extremities spontaneously. Gait is normal. CNII-XII grossly in tact. Psych:  Responds to questions appropriately with a normal affect.     ASSESSMENT AND PLAN:  51 y.o. year old female with  1. Sweats, menopausal Check labs to evaluate possible causes of sweats. If labs  are normal and do not suggest menopause then may be coming from her psych meds and she will then follow up with psychiatry. - Follicle stimulating hormone - Luteinizing hormone - TSH  2. Screening for colorectal cancer She states that she had one colonoscopy literally about 20 years ago but has had no colonoscopy since. States that last year at her physical when she was 51 years old was told to have colonoscopy but she has not had it done yet and was wanting to go ahead and get this scheduled in preparation for her upcoming physical with Dr. Buelah Manis. I explained to patient that I put in a referral to GI and she will go there for a visit and they will schedule the procedure. - Ambulatory referral to Gastroenterology   Signed, Olean Ree Nageezi, [place], [facility] 11/07/2016 10:47 AM

## 2016-11-07 NOTE — Progress Notes (Signed)
Daily Group Progress Note     Program: OP   Group Time: 5:30-6:30  Participation Level: Active  Behavioral Response: Appropriate  Type of Therapy:  Psychoeducation/Therapy  Summary of Progress: Pt participated in a discussion on shame and self-compassion. Pt was able to identify a shaming experience during childhood or adulthood and share the powerful emotion of shame. Pt was encouraged to use self-compassion to talk to herself with the same kindness, caring and compassion shown to others.   Lisbeth S. Mackenzie, LCAS  

## 2016-11-08 ENCOUNTER — Encounter: Payer: Self-pay | Admitting: Physician Assistant

## 2016-11-08 LAB — FOLLICLE STIMULATING HORMONE: FSH: 62 m[IU]/mL

## 2016-11-08 LAB — LUTEINIZING HORMONE: LH: 46.8 m[IU]/mL

## 2016-11-08 MED ORDER — DESVENLAFAXINE SUCCINATE ER 50 MG PO TB24: 50.0000 mg | ORAL_TABLET | Freq: Every day | ORAL | 1 refills | Status: DC

## 2016-11-08 NOTE — Telephone Encounter (Signed)
Called and spoke with patient. Pt reports her Effexor was stopped due to starting Tramadol by her PCP. Pt is taking Tramadol 50mg  BID. She has a couple of episodes of anxiety since stopping it. Pt states she has been having hot flashes as she is post-menopausal.   P: Reviewed genesight test results which show that Pristiq 50mg  po qD for depression and anxiety

## 2016-11-13 ENCOUNTER — Ambulatory Visit (INDEPENDENT_AMBULATORY_CARE_PROVIDER_SITE_OTHER): Payer: 59 | Admitting: Licensed Clinical Social Worker

## 2016-11-13 DIAGNOSIS — F4310 Post-traumatic stress disorder, unspecified: Secondary | ICD-10-CM

## 2016-11-14 ENCOUNTER — Encounter (HOSPITAL_COMMUNITY): Payer: Self-pay | Admitting: Licensed Clinical Social Worker

## 2016-11-14 NOTE — Progress Notes (Signed)
Daily Group Progress Note     Program: OP   Group Time: 5:30-6:30  Participation Level: Active  Behavioral Response: Appropriate  Type of Therapy:  Psychoeducation/Therapy  Summary of Progress: Pt participated in a discussion on "I am not my mental illness." Pt shared that she does not want to be labeled as her mental illness. She does not want to be ashamed of her mental illness; it is a part of her, not who she is. Pt was encouraged to not let the stigma of mental illness define who she is.  Lisbeth S. Mackenzie, LCAS 

## 2016-11-15 ENCOUNTER — Encounter (HOSPITAL_COMMUNITY): Payer: Self-pay | Admitting: Psychiatry

## 2016-11-15 ENCOUNTER — Ambulatory Visit (INDEPENDENT_AMBULATORY_CARE_PROVIDER_SITE_OTHER): Payer: 59 | Admitting: Psychiatry

## 2016-11-15 ENCOUNTER — Encounter (HOSPITAL_COMMUNITY): Payer: Self-pay | Admitting: Emergency Medicine

## 2016-11-15 ENCOUNTER — Telehealth: Payer: Self-pay

## 2016-11-15 ENCOUNTER — Emergency Department (HOSPITAL_COMMUNITY)
Admission: EM | Admit: 2016-11-15 | Discharge: 2016-11-15 | Disposition: A | Discharge status: Home / Self Care | Payer: 59 | Attending: Emergency Medicine | Admitting: Emergency Medicine

## 2016-11-15 VITALS — BP 160/110 | HR 100 | Ht 67.0 in | Wt 220.0 lb

## 2016-11-15 DIAGNOSIS — J45909 Unspecified asthma, uncomplicated: Secondary | ICD-10-CM | POA: Insufficient documentation

## 2016-11-15 DIAGNOSIS — Z79899 Other long term (current) drug therapy: Secondary | ICD-10-CM | POA: Insufficient documentation

## 2016-11-15 DIAGNOSIS — R51 Headache: Secondary | ICD-10-CM | POA: Insufficient documentation

## 2016-11-15 DIAGNOSIS — G4701 Insomnia due to medical condition: Secondary | ICD-10-CM

## 2016-11-15 DIAGNOSIS — F4310 Post-traumatic stress disorder, unspecified: Secondary | ICD-10-CM | POA: Diagnosis not present

## 2016-11-15 DIAGNOSIS — G8921 Chronic pain due to trauma: Secondary | ICD-10-CM | POA: Insufficient documentation

## 2016-11-15 DIAGNOSIS — F332 Major depressive disorder, recurrent severe without psychotic features: Secondary | ICD-10-CM

## 2016-11-15 DIAGNOSIS — F17210 Nicotine dependence, cigarettes, uncomplicated: Secondary | ICD-10-CM

## 2016-11-15 DIAGNOSIS — I10 Essential (primary) hypertension: Secondary | ICD-10-CM | POA: Insufficient documentation

## 2016-11-15 DIAGNOSIS — M545 Low back pain: Secondary | ICD-10-CM | POA: Diagnosis not present

## 2016-11-15 MED ORDER — HYDROXYZINE PAMOATE 25 MG PO CAPS: 25.0000 mg | ORAL_CAPSULE | Freq: Three times a day (TID) | ORAL | 1 refills | Status: DC | PRN

## 2016-11-15 MED ORDER — KETOROLAC TROMETHAMINE 60 MG/2ML IM SOLN
60.0000 mg | Freq: Once | INTRAMUSCULAR | Status: AC
  Administered 2016-11-15: 60 mg via INTRAMUSCULAR
  Filled 2016-11-15: qty 2

## 2016-11-15 NOTE — Telephone Encounter (Signed)
When I was going to call pt back to get more info I saw where she was at the ED

## 2016-11-15 NOTE — Progress Notes (Signed)
Pt reported physical set with PCP on 6/19. Will follow up BP.

## 2016-11-15 NOTE — ED Provider Notes (Addendum)
[facility] DEPT Provider Note   CSN: [id number] Arrival date & time: 11/15/16  1525     History   Chief Complaint Chief Complaint  Patient presents with  . Hypertension  . Pain    HPI Melody Jenkins is a 51 y.o. female.  HPI  51 year old female presents with HTN from her psychiatrist office. Has had HTN since summer 2017 after an accident. Is on 25 mg HCTZ. No changes in this. Recently has had some of her psychiatric medicine adjusted. Is on tramadol 1-2 tablets daily for chronic pain. Chronic pain includes chronic headaches and back pain since last year. No new or worse headache, no chest pain, dyspnea, weakness. Back pain is stable. She thinks her BP is related to the uncontrolled pain. Has tried calling her pain specialist over past several days with no response. Has PCP appointment next week with labs being drawn prior to this. She states over last couple months her BP has ranged in the 150s/90s. Today at the office it was 160/110. Asking for something to help control her chronic pain. She states her depression is stable, no SI  Past Medical History:  Diagnosis Date  . Anemia   . Anxiety   . Asthma   . Depression   . History of multiple miscarriages   . Hyperlipidemia     Patient Active Problem List   Diagnosis Date Noted  . Tear of MCL (medial collateral ligament) of knee, left, subsequent encounter 04/06/2016  . Anterolisthesis 04/06/2016  . Situational mixed anxiety and depressive disorder 02/26/2016  . Insomnia 02/24/2016  . Back pain with radiation 02/14/2016  . Grief reaction 02/14/2016  . Essential hypertension, benign 12/26/2015  . DDD (degenerative disc disease), lumbar 12/26/2015  . Seasonal allergies 12/26/2015  . Tobacco user 04/06/2015  . Obesity 04/06/2015  . Anemia   . Asthma   . Hyperlipidemia     Past Surgical History:  Procedure Laterality Date  . BREAST SURGERY  2000   reduction  . COSMETIC SURGERY  2000   breast reduction    OB  History    No data available       Home Medications    Prior to Admission medications   Medication Sig Start Date End Date Taking? Authorizing Provider  acetaminophen (TYLENOL) 500 MG tablet Take 500 mg by mouth every 4 (four) hours as needed for moderate pain.   Yes [provider]  albuterol (PROVENTIL HFA;VENTOLIN HFA) 108 (90 BASE) MCG/ACT inhaler Inhale 2 puffs into the lungs every 6 (six) hours as needed for wheezing or shortness of breath.   Yes [provider]  Biotin 5000 MCG CAPS Take 1 capsule by mouth.   Yes [provider]  Cyanocobalamin (VITAMIN B 12 PO) Take 1 tablet by mouth daily.    Yes [provider]  desvenlafaxine (PRISTIQ) 50 MG 24 hr tablet Take 1 tablet (50 mg total) by mouth daily. 11/08/16  Yes Charlcie Cradle, MD  hydrochlorothiazide (HYDRODIURIL) 25 MG tablet Take 1 tablet (25 mg total) by mouth daily. 11/07/16  Yes Orlena Sheldon, PA-C  hydrOXYzine (VISTARIL) 25 MG capsule Take 1 capsule (25 mg total) by mouth 3 (three) times daily as needed. 11/15/16  Yes Charlcie Cradle, MD  meloxicam (MOBIC) 15 MG tablet Take 15 mg by mouth 2 (two) times daily.   Yes [provider]  Probiotic Product (PROBIOTIC COLON SUPPORT PO) Take by mouth.   Yes [provider]  traMADol (ULTRAM) 50 MG tablet  Take 50 mg by mouth 2 (two) times daily.   Yes [provider]  vitamin C (ASCORBIC ACID) 500 MG tablet Take 500 mg by mouth daily.   Yes [provider]  Vitamin D, Cholecalciferol, 1000 UNITS CAPS Take 2 capsules by mouth.    Yes [provider]  cyclobenzaprine (FLEXERIL) 10 MG tablet Take 1 tablet (10 mg total) by mouth 3 (three) times daily as needed for muscle spasms. Patient not taking: Reported on 11/15/2016 05/18/16   Alycia Rossetti, MD    Family History Family History  Problem Relation Age of Onset  . Diabetes Mother   . Hypertension Mother   . Hypertension Father   . Heart disease Father     . Early death Brother   . Kidney disease Maternal Grandmother   . Heart disease Brother     Social History Social History  Substance Use Topics  . Smoking status: Current Every Day Smoker    Packs/day: 0.25    Types: Cigarettes  . Smokeless tobacco: Never Used     Comment: Has cut back to 2 a day and trying to quit  . Alcohol use No     Allergies   Trazodone and nefazodone   Review of Systems Review of Systems  Constitutional: Negative for fever.  Eyes: Negative for visual disturbance.  Respiratory: Negative for shortness of breath.   Cardiovascular: Negative for chest pain.  Gastrointestinal: Negative for abdominal pain.  Musculoskeletal: Positive for back pain (chronic).  Neurological: Positive for headaches (chronic). Negative for weakness and numbness.  All other systems reviewed and are negative.    Physical Exam Updated Vital Signs BP (!) 155/96 (BP Location: Right Arm)   Pulse 97   Temp 98.1 F (36.7 C) (Oral)   Resp 16   Ht 5' 7.5" (1.715 m)   Wt 99.8 kg (220 lb)   SpO2 99%   BMI 33.95 kg/m   Physical Exam  Constitutional: She is oriented to person, place, and time. She appears well-developed and well-nourished. No distress.  HENT:  Head: Normocephalic and atraumatic.  Right Ear: External ear normal.  Left Ear: External ear normal.  Nose: Nose normal.  Eyes: EOM are normal. Pupils are equal, round, and reactive to light. Right eye exhibits no discharge. Left eye exhibits no discharge.  Neck: Neck supple.  Cardiovascular: Normal rate, regular rhythm and normal heart sounds.   Pulmonary/Chest: Effort normal and breath sounds normal.  Abdominal: Soft. There is no tenderness.  Neurological: She is alert and oriented to person, place, and time.  CN 3-12 grossly intact. 5/5 strength in all 4 extremities. Grossly normal sensation. Normal finger to nose.   Skin: Skin is warm and dry. She is not diaphoretic.  Nursing note and vitals reviewed.    ED  Treatments / Results  Labs (all labs ordered are listed, but only abnormal results are displayed) Labs Reviewed - No data to display  EKG  EKG Interpretation None       Radiology No results found.  Procedures Procedures (including critical care time)  Medications Ordered in ED Medications  ketorolac (TORADOL) injection 60 mg (not administered)     Initial Impression / Assessment and Plan / ED Course  I have reviewed the triage vital signs and the nursing notes.  Pertinent labs & imaging results that were available during my care of the patient were reviewed by me and considered in my medical decision making (see chart for details).     Patient  appears to have mildly worse HTN than baseline, although based on her report this is essentially at baseline now. No new signs/symptoms to be concerned about end organ damage. At this point, given already having close PCP f/u, I recommended keeping this follow up for medication adjustment. Will give IM toradol for pain which is chronic. No signs/symptoms of acute neuro emergency. Discussed strict return precautions.   Final Clinical Impressions(s) / ED Diagnoses   Final diagnoses:  Essential hypertension  Chronic pain due to injury    New Prescriptions New Prescriptions   No medications on file     Sherwood Gambler, MD 11/15/16 1631    Sherwood Gambler, MD 11/15/16 [phone]

## 2016-11-15 NOTE — ED Notes (Signed)
Pt ambulated to room with steady gait and no assistance.

## 2016-11-15 NOTE — Progress Notes (Signed)
[facility] MD/PA/NP OP Progress Note  11/15/2016 2:32 PM AMIRAH GOERKE  MRN:  [id number]  Chief Complaint:  Chief Complaint    Follow-up      HPI: Pt started Pristiq last week. Her pain doctor wanted her to stop Effexor when he prescribed Tramadol. Pt states her pain is uncontrolled. It makes her anxious and depressed.  States she is always depressed. Pt tries to stay busy but is lonely. She feels down and has frequent crying spells. Pt feels helpless due to her pain. She is limited by her physical pain so much that she can not walk, drive or do chores for longer than a few minutes. She also admits to feeling hopeless about the pain getting better. Pt denies SI/HI.  Sleep is poor. She sleeps on the sofa. States she has some phobia about her bedroom. She wants to get the room painted and new furniture to help her move back in there. Energy is low.   PTSD- Intrusive memories are less frequent. She tries to avoid thinking about it and triggers as much as possible. She has rare nightmares but all her dreams are bad.   Taking meds as prescribed and denies SE.    Visit Diagnosis:    ICD-10-CM   1. Severe episode of recurrent major depressive disorder, without psychotic features ([place]) F33.2   2. PTSD (post-traumatic stress disorder) F43.10   3. Insomnia due to medical condition G47.01       Past Psychiatric History: Patient endorse history of depression 20 years ago when she was given Lexapro by her primary care physician. She remember a good response from the Lexapro. Patient denies any history of psychiatric inpatient treatment, psychosis, suicidal attempt, mania, hallucination, OCD symptoms.   Previous Psychotropic Medications: Lexapro, Effexor  Substance Abuse History in the last 12 months: No.  Consequences of Substance Abuse: Patient endorse history of drinking alcohol on the weekends and get some time intoxicated but denies any history of seizures, tremors, blackouts or any  withdrawal symptoms.  Past Medical History:  Past Medical History:  Diagnosis Date  . Anemia   . Anxiety   . Asthma   . Depression   . History of multiple miscarriages   . Hyperlipidemia     Past Surgical History:  Procedure Laterality Date  . BREAST SURGERY  2000   reduction  . COSMETIC SURGERY  2000   breast reduction    Family Psychiatric History:  Family History  Problem Relation Age of Onset  . Diabetes Mother   . Hypertension Mother   . Hypertension Father   . Heart disease Father   . Early death Brother   . Kidney disease Maternal Grandmother   . Heart disease Brother     Social History:  Social History   Social History  . Marital status: Single    Spouse name: N/A  . Number of children: N/A  . Years of education: N/A   Social History Main Topics  . Smoking status: Current Every Day Smoker    Packs/day: 0.25    Types: Cigarettes  . Smokeless tobacco: Never Used     Comment: Has cut back to 2 a day and trying to quit  . Alcohol use No  . Drug use: No  . Sexual activity: Not Currently    Birth control/ protection: None   Other Topics Concern  . Not on file   Social History Narrative  . No narrative on file    Allergies:  Allergies  Allergen  Reactions  . Trazodone And Nefazodone     Metabolic Disorder Labs: Lab Results  Component Value Date   HGBA1C 5.4 04/06/2016   MPG 108 04/06/2016   No results found for: PROLACTIN Lab Results  Component Value Date   CHOL 180 03/05/2016   TRIG 145 03/05/2016   HDL 30 (L) 03/05/2016   CHOLHDL 6.0 (H) 03/05/2016   VLDL 29 03/05/2016   LDLCALC 121 03/05/2016   LDLCALC 113 04/06/2015     Current Medications: Current Outpatient Prescriptions  Medication Sig Dispense Refill  . acetaminophen (TYLENOL) 500 MG tablet Take 500 mg by mouth every 4 (four) hours as needed for moderate pain.    Marland Kitchen albuterol (PROVENTIL HFA;VENTOLIN HFA) 108 (90 BASE) MCG/ACT inhaler Inhale 2 puffs into the lungs every 6  (six) hours as needed for wheezing or shortness of breath.    . Biotin 5000 MCG CAPS Take 1 capsule by mouth.    . Cyanocobalamin (VITAMIN B 12 PO) Take by mouth.    . cyclobenzaprine (FLEXERIL) 10 MG tablet Take 1 tablet (10 mg total) by mouth 3 (three) times daily as needed for muscle spasms. 30 tablet 0  . desvenlafaxine (PRISTIQ) 50 MG 24 hr tablet Take 1 tablet (50 mg total) by mouth daily. 30 tablet 1  . hydrochlorothiazide (HYDRODIURIL) 25 MG tablet Take 1 tablet (25 mg total) by mouth daily. 30 tablet 3  . hydrOXYzine (VISTARIL) 25 MG capsule Take 25 mg by mouth 2 (two) times daily as needed.    . meloxicam (MOBIC) 15 MG tablet Take 15 mg by mouth 2 (two) times daily.    . Probiotic Product (PROBIOTIC COLON SUPPORT PO) Take by mouth.    . traMADol (ULTRAM) 50 MG tablet Take 50 mg by mouth 2 (two) times daily.    Marland Kitchen venlafaxine XR (EFFEXOR-XR) 75 MG 24 hr capsule TAKE 1 CAPSULE (75 MG TOTAL) BY MOUTH DAILY. 7 capsule 0  . vitamin C (ASCORBIC ACID) 500 MG tablet Take 500 mg by mouth daily.    . Vitamin D, Cholecalciferol, 1000 UNITS CAPS Take 2 capsules by mouth.      No current facility-administered medications for this visit.       Musculoskeletal: Strength & Muscle Tone: decreased Gait & Station: unsteady, walking with cane Patient leans: N/A  Psychiatric Specialty Exam: Review of Systems  Eyes: Negative for blurred vision, double vision and pain.  Musculoskeletal: Positive for back pain and joint pain. Negative for neck pain.  Neurological: Positive for headaches. Negative for dizziness and speech change.  Psychiatric/Behavioral: Positive for depression. Negative for hallucinations, substance abuse and suicidal ideas. The patient is nervous/anxious and has insomnia.     Blood pressure (!) 160/110, pulse 100, height 5\' 7"  (1.702 m), weight 220 lb (99.8 kg).Body mass index is 34.46 kg/m. Pt left message for her PCP about her elevated BP in writer's prescense today  General  Appearance: Fairly Groomed  Eye Contact:  Good  Speech:  Clear and Coherent and Normal Rate  Volume:  Normal  Mood:  Anxious and Depressed  Affect:  Congruent  Thought Process:  Coherent and Descriptions of Associations: Circumstantial  Orientation:  Full (Time, Place, and Person)  Thought Content: Rumination   Suicidal Thoughts:  No  Homicidal Thoughts:  No  Memory:  Immediate;   Good Recent;   Good Remote;   Good  Judgement:  Good  Insight:  Good  Psychomotor Activity:  Normal  Concentration:  Concentration: Good and Attention Span: Good  Recall:  Good  Fund of Knowledge: Good  Language: Good  Akathisia:  No  Handed:  Right  AIMS (if indicated):  n/a  Assets:  Communication Skills Desire for Improvement Housing Social Support  ADL's:  Intact  Cognition: WNL  Sleep:  poor     Treatment Plan Summary:Medication management   Assessment: MDD-recurrent, moderate; PTSD   Medication management with supportive therapy. Risks/benefits and SE of the medication discussed. Pt verbalized understanding and verbal consent obtained for treatment.  Affirm with the patient that the medications are taken as ordered. Patient expressed understanding of how their medications were to be used.   Meds: Pristiq 50mg  qD for depression and PTSD Increase Vistaril 25mg  po TID prn anxiety   Labs: none   Therapy: brief supportive therapy provided. Discussed psychosocial stressors in detail.    Consultations:  None  Pt denies SI and is at an acute low risk for suicide. Patient told to call clinic if any problems occur. Patient advised to go to ER if they should develop SI/HI, side effects, or if symptoms worsen. Has crisis numbers to call if needed. Pt verbalized understanding.  F/up in 2 months or sooner if needed    Charlcie Cradle, MD 11/15/2016, 2:32 PM

## 2016-11-15 NOTE — ED Triage Notes (Signed)
Patient in with complaints of hypertension. Reports that she was at the psychiatrist office and was sent here due to elevated blood pressure. States that she is in a lot of pain and that could be the cause. Follow by pain management.

## 2016-11-15 NOTE — Progress Notes (Signed)
Pt BP was taken again upon request of doctor. 171/100 101 Pt is going to [facility] hospital and has notified her mother. Pt expressed that she had an emotional session with doctor and anticipated for her BP to increase.

## 2016-11-15 NOTE — Telephone Encounter (Addendum)
Pt was at her psychiatrist  today they took her b/p and it was 160/110 pt was instructed by psychiatrist office to notify her PCP. Pt is complaining of back pain. Pt rushed to get off the phone because she was being called back.

## 2016-11-19 ENCOUNTER — Telehealth (HOSPITAL_COMMUNITY): Payer: Self-pay

## 2016-11-19 NOTE — Telephone Encounter (Signed)
Patient is calling because she said she is having increased heart rate, sweating, and pain in her joints. She said she is also feeling like she is on the verge of an asthma attack all the time. She said she can not remember specifically what you told her to look out for when starting the Desvenlafaxine, but she wanted you to know and to see if she needs to continue and stop. Please review and advise, thank you

## 2016-11-20 ENCOUNTER — Ambulatory Visit (HOSPITAL_COMMUNITY): Payer: Self-pay | Admitting: Licensed Clinical Social Worker

## 2016-11-20 ENCOUNTER — Ambulatory Visit (INDEPENDENT_AMBULATORY_CARE_PROVIDER_SITE_OTHER): Payer: 59 | Admitting: Family Medicine

## 2016-11-20 ENCOUNTER — Telehealth (HOSPITAL_COMMUNITY): Payer: Self-pay

## 2016-11-20 ENCOUNTER — Encounter: Payer: Self-pay | Admitting: Family Medicine

## 2016-11-20 VITALS — BP 140/88 | HR 82 | Temp 97.7°F | Resp 14 | Ht 69.0 in | Wt 223.0 lb

## 2016-11-20 DIAGNOSIS — R232 Flushing: Secondary | ICD-10-CM

## 2016-11-20 DIAGNOSIS — K5903 Drug induced constipation: Secondary | ICD-10-CM | POA: Diagnosis not present

## 2016-11-20 DIAGNOSIS — E782 Mixed hyperlipidemia: Secondary | ICD-10-CM | POA: Diagnosis not present

## 2016-11-20 DIAGNOSIS — K5909 Other constipation: Secondary | ICD-10-CM | POA: Insufficient documentation

## 2016-11-20 DIAGNOSIS — I10 Essential (primary) hypertension: Secondary | ICD-10-CM | POA: Diagnosis not present

## 2016-11-20 LAB — COMPREHENSIVE METABOLIC PANEL
ALT: 23 U/L (ref 6–29)
AST: 21 U/L (ref 10–35)
Albumin: 3.9 g/dL (ref 3.6–5.1)
Alkaline Phosphatase: 76 U/L (ref 33–130)
BUN: 20 mg/dL (ref 7–25)
CHLORIDE: 99 mmol/L (ref 98–110)
CO2: 28 mmol/L (ref 20–31)
CREATININE: 0.87 mg/dL (ref 0.50–1.05)
Calcium: 9.7 mg/dL (ref 8.6–10.4)
GLUCOSE: 81 mg/dL (ref 70–99)
POTASSIUM: 3.9 mmol/L (ref 3.5–5.3)
SODIUM: 137 mmol/L (ref 135–146)
TOTAL PROTEIN: 7.4 g/dL (ref 6.1–8.1)
Total Bilirubin: 0.5 mg/dL (ref 0.2–1.2)

## 2016-11-20 LAB — CBC WITH DIFFERENTIAL/PLATELET
BASOS ABS: 0 {cells}/uL (ref 0–200)
Basophils Relative: 0 %
EOS PCT: 2 %
Eosinophils Absolute: 176 cells/uL (ref 15–500)
HCT: 46.5 % — ABNORMAL HIGH (ref 35.0–45.0)
Hemoglobin: 14.7 g/dL (ref 12.0–15.0)
LYMPHS PCT: 43 %
Lymphs Abs: 3784 cells/uL (ref 850–3900)
MCH: 26.7 pg — AB (ref 27.0–33.0)
MCHC: 31.6 g/dL — ABNORMAL LOW (ref 32.0–36.0)
MCV: 84.5 fL (ref 80.0–100.0)
MONOS PCT: 7 %
MPV: 9.9 fL (ref 7.5–12.5)
Monocytes Absolute: 616 cells/uL (ref 200–950)
Neutro Abs: 4224 cells/uL (ref 1500–7800)
Neutrophils Relative %: 48 %
PLATELETS: 281 10*3/uL (ref 140–400)
RBC: 5.5 MIL/uL — ABNORMAL HIGH (ref 3.80–5.10)
RDW: 15.4 % — AB (ref 11.0–15.0)
WBC: 8.8 10*3/uL (ref 3.8–10.8)

## 2016-11-20 MED ORDER — NEBIVOLOL HCL 5 MG PO TABS: 5.0000 mg | ORAL_TABLET | Freq: Every day | ORAL | 0 refills | Status: DC

## 2016-11-20 MED ORDER — GABAPENTIN 300 MG PO CAPS
300.0000 mg | ORAL_CAPSULE | Freq: Every day | ORAL | 3 refills | Status: DC
Start: 2016-11-20 — End: 2017-08-16

## 2016-11-20 NOTE — Progress Notes (Signed)
Subjective:    Patient ID: Melody Jenkins, female    DOB: Feb 25, 1966, 51 y.o.   MRN: [id number]  Patient presents for HTN (has been seen in ER for high BP- has been using vinegar as well )  She missed to having continued chronic pain. She is walking with a cane today in the office. She was seen in the ER secondary to increased pain in her blood pressure. ER note states that she wishes asking for something for pain she was given a Toradol injection. She is being managed by Dr. Saintclair Halsted as well as Dr. Brien Few. She states that Dr. Brien Few wondered her off of Xanax with not see listed the bottle she keeps told not is hydroxyzine before he would put her on a different pain medication.    Blood pressure- Has been taking HCTZ, taking braggs vinegar twice a day , 136-191/97-110 for past few weeks   Major depression she is still following with her psychiatrist per the notes there below breakthroughs with regards to her moodiness it is tied to her pain. She was recently placed on Pristiq  She has a wart about hot flashes. She had labs done which showed a post menopausal state. She has not had a cycle in almost one year.  Review Of Systems:  GEN- denies fatigue, fever, weight loss,weakness, recent illness HEENT- denies eye drainage, change in vision, nasal discharge, CVS- denies chest pain, palpitations RESP- denies SOB, cough, wheeze ABD- denies N/V, change in stools, abd pain GU- denies dysuria, hematuria, dribbling, incontinence MSK- + joint pain, muscle aches, injury Neuro- denies headache, dizziness, syncope, seizure activity       Objective:    BP 140/88   Pulse 82   Temp 97.7 F (36.5 C) (Oral)   Resp 14   Ht 5\' 9"  (1.753 m)   Wt 223 lb (101.2 kg)   SpO2 99%   BMI 32.93 kg/m  GEN- NAD, alert and oriented x3    Right  150/96 , 152/92 HEENT- PERRL, EOMI, non injected sclera, pink conjunctiva, MMM, oropharynx clear Neck- Supple, no thyromegaly CVS- RRR, no murmur RESP-CTAB EXT- No  edema Pulses- Radial  2+        Assessment & Plan:      Problem List Items Addressed This Visit    Hyperlipidemia    Check lipids, discussed dietary changes, avoid fast food, fried foods, decrease salt intake       Relevant Medications   nebivolol (BYSTOLIC) 5 MG tablet   Hot flashes    Based on labs and abscense of menses for almost year, she is in menopause With her HTN, would not treat with any hormones at this time Discussed this with her      Relevant Medications   nebivolol (BYSTOLIC) 5 MG tablet   Essential hypertension, benign - Primary    Uncontrolled, added bystolic 5mg , given samples in office Based on history, she gets anxious and severe pain, HR also goes at times Continue HCTZ      Relevant Medications   nebivolol (BYSTOLIC) 5 MG tablet   Other Relevant Orders   CBC with Differential/Platelet   Comprehensive metabolic panel   Constipation    End of visit asked about something to help with constipation She has been on pain meds for quite some time, tried miralax did not help, MOM Given samples of Linzess 145mg          Note: This dictation was prepared with Dragon dictation along with smaller phrase technology.  Any transcriptional errors that result from this process are unintentional.

## 2016-11-20 NOTE — Assessment & Plan Note (Addendum)
Based on labs and abscense of menses for almost year, she is in menopause With her HTN, would not treat with any hormones at this time Discussed this with her

## 2016-11-20 NOTE — Assessment & Plan Note (Signed)
End of visit asked about something to help with constipation She has been on pain meds for quite some time, tried miralax did not help, MOM Given samples of Linzess 145mg 

## 2016-11-20 NOTE — Addendum Note (Signed)
Addended by: Vic Blackbird F on: 11/20/2016 04:49 PM   Modules accepted: Orders

## 2016-11-20 NOTE — Patient Instructions (Addendum)
Try the bystolic for blood pressure Try the Linzess for your constipation Cancel the lab appointment for Monday Change her Physical to a 15 minute slot for Wed the 20th

## 2016-11-20 NOTE — Telephone Encounter (Signed)
Patient called today to let me know that Dr. Brien Few - her pain management doctor - wants her off of Lorazepam before he will increase her pain medication. According to the patient he said that the Lorazepam was Xanax. I called patient back and left her a voicemail letting her know that we did not have a record of her currently being on Lorazepam. It looks like it was prescribed by Dr. Buelah Manis on an as needed basis and it was last prescribed in Dec. 2017. I explained that these two medications are members of the same family and would probably be the reason why he will not increase the pain medication. I also explained to the patient that she reported back in March she is no longer taking the Ativan and that if she is still taking it, that needs to be disclosed on her medication list. I asked patient to return my call if there are any questions.

## 2016-11-20 NOTE — Assessment & Plan Note (Signed)
Uncontrolled, added bystolic 5mg , given samples in office Based on history, she gets anxious and severe pain, HR also goes at times Continue HCTZ

## 2016-11-20 NOTE — Assessment & Plan Note (Signed)
Check lipids, discussed dietary changes, avoid fast food, fried foods, decrease salt intake

## 2016-11-21 ENCOUNTER — Ambulatory Visit (HOSPITAL_COMMUNITY): Payer: Self-pay | Admitting: Licensed Clinical Social Worker

## 2016-11-22 NOTE — Telephone Encounter (Signed)
If not better by the end of the weekend then she should stop it.

## 2016-11-23 NOTE — Telephone Encounter (Signed)
I called patient and left voicemail letting her know what the doctor said - I left my number for patient to call back with any questions.

## 2016-11-26 ENCOUNTER — Other Ambulatory Visit: Payer: 59

## 2016-11-27 ENCOUNTER — Ambulatory Visit (INDEPENDENT_AMBULATORY_CARE_PROVIDER_SITE_OTHER): Payer: 59 | Admitting: Licensed Clinical Social Worker

## 2016-11-27 DIAGNOSIS — F4310 Post-traumatic stress disorder, unspecified: Secondary | ICD-10-CM

## 2016-11-28 ENCOUNTER — Ambulatory Visit (INDEPENDENT_AMBULATORY_CARE_PROVIDER_SITE_OTHER): Payer: 59 | Admitting: Family Medicine

## 2016-11-28 ENCOUNTER — Encounter: Payer: 59 | Admitting: Family Medicine

## 2016-11-28 ENCOUNTER — Encounter: Payer: Self-pay | Admitting: Family Medicine

## 2016-11-28 ENCOUNTER — Encounter: Payer: Self-pay | Admitting: *Deleted

## 2016-11-28 ENCOUNTER — Encounter (HOSPITAL_COMMUNITY): Payer: Self-pay | Admitting: Licensed Clinical Social Worker

## 2016-11-28 ENCOUNTER — Ambulatory Visit (HOSPITAL_COMMUNITY): Payer: Self-pay | Admitting: Licensed Clinical Social Worker

## 2016-11-28 VITALS — BP 136/80 | HR 66 | Temp 98.3°F | Resp 14 | Ht 69.0 in | Wt 227.0 lb

## 2016-11-28 DIAGNOSIS — K5903 Drug induced constipation: Secondary | ICD-10-CM | POA: Diagnosis not present

## 2016-11-28 DIAGNOSIS — F5101 Primary insomnia: Secondary | ICD-10-CM | POA: Diagnosis not present

## 2016-11-28 DIAGNOSIS — I10 Essential (primary) hypertension: Secondary | ICD-10-CM | POA: Diagnosis not present

## 2016-11-28 MED ORDER — NEBIVOLOL HCL 5 MG PO TABS: 5.0000 mg | ORAL_TABLET | Freq: Every day | ORAL | 6 refills | Status: DC

## 2016-11-28 MED ORDER — NEBIVOLOL HCL 5 MG PO TABS: 5.0000 mg | ORAL_TABLET | Freq: Every day | ORAL | 1 refills | Status: DC

## 2016-11-28 MED ORDER — LINACLOTIDE 145 MCG PO CAPS: 145.0000 ug | ORAL_CAPSULE | Freq: Every day | ORAL | 1 refills | Status: DC

## 2016-11-28 NOTE — Progress Notes (Signed)
   Subjective:    Patient ID: Melody Jenkins, female    DOB: Mar 26, 1966, 51 y.o.   MRN: [id number]  Patient presents for Follow-up (is not fasting) and Insomnia (difficulty falling asleep and staying asleep- reports that she also has vivid nightmares)  Patient here for a one-week follow-up on her blood pressure. She was seen last week after she had been in the emergency room secondary to her back pain a blood pressure was noted to be elevated. Bystolic 5 mg was added to her hydrochlorothiazide. She was given samples of this medication.  Constipation she was given samples of Linzess 145mg  to try, she is chronic pain medication, this has helped her bowels    Metaolic panel and CBC rechecked and normal   She has been having some nightmares and vivid dreams recently did not mention to her psychiatrst but plans to  Recently started with gabapentin whic has helped her chronic pain  Review Of Systems:  GEN- denies fatigue, fever, weight loss,weakness, recent illness HEENT- denies eye drainage, change in vision, nasal discharge, CVS- denies chest pain, palpitations RESP- denies SOB, cough, wheeze ABD- denies N/V, change in stools, abd pain GU- denies dysuria, hematuria, dribbling, incontinence  MSK- + joint pain, muscle aches, injury Neuro- denies headache, dizziness, syncope, seizure activity       Objective:    BP 136/80   Pulse 66   Temp 98.3 F (36.8 C) (Oral)   Resp 14   Ht 5\' 9"  (1.753 m)   Wt 227 lb (103 kg) Comment: with purse  SpO2 98%   BMI 33.52 kg/m  GEN- NAD, alert and oriented x3 CVS- RRR, no murmur RESP-CTAB Pulses- Radial 2+        Assessment & Plan:      Problem List Items Addressed This Visit    Insomnia    Recommend she discuss with her psychiatrist who is prescribing her psych meds She has vistaril can take her last dose at bedtime, if she does not fall asleep with her gabapentin and ultram.      Essential hypertension, benign - Primary    Much  improved, continue bystolic and HCTZ      Relevant Medications   nebivolol (BYSTOLIC) 5 MG tablet   Constipation    contiue linzess          Note: This dictation was prepared with Dragon dictation along with smaller phrase technology. Any transcriptional errors that result from this process are unintentional.

## 2016-11-28 NOTE — Progress Notes (Signed)
Daily Group Progress Note     Program: OP   Group Time: 5:30-6:30  Participation Level: Active  Behavioral Response: Appropriate  Type of Therapy:  Psychoeducation/Therapy  Summary of Progress: Pt participated in a discussion on hope. Pt experiences chronic pain which increases her feelings of depression which has led to feelings of hopelessness. Pt was encouraged to continue to aim to have a satisfying life despite limitations posed by her chronic pain and on going depression. Pt was given a grounding exercise "mindfulness of the breath" handout. [name], LCAS

## 2016-11-28 NOTE — Assessment & Plan Note (Signed)
Recommend she discuss with her psychiatrist who is prescribing her psych meds She has vistaril can take her last dose at bedtime, if she does not fall asleep with her gabapentin and ultram.

## 2016-11-28 NOTE — Assessment & Plan Note (Signed)
Much improved, continue bystolic and HCTZ

## 2016-11-28 NOTE — Assessment & Plan Note (Signed)
contiue linzess

## 2016-11-28 NOTE — Patient Instructions (Addendum)
F/U end of Sept for Physical [place] for hot flashes Continue the bystolic Continue Linzess

## 2016-11-30 ENCOUNTER — Encounter: Payer: Self-pay | Admitting: Family Medicine

## 2016-12-11 ENCOUNTER — Ambulatory Visit (INDEPENDENT_AMBULATORY_CARE_PROVIDER_SITE_OTHER): Payer: 59 | Admitting: Licensed Clinical Social Worker

## 2016-12-11 ENCOUNTER — Ambulatory Visit (HOSPITAL_COMMUNITY): Payer: Self-pay | Admitting: Licensed Clinical Social Worker

## 2016-12-11 DIAGNOSIS — F4310 Post-traumatic stress disorder, unspecified: Secondary | ICD-10-CM | POA: Diagnosis not present

## 2016-12-18 ENCOUNTER — Encounter (HOSPITAL_COMMUNITY): Payer: Self-pay | Admitting: Licensed Clinical Social Worker

## 2016-12-18 ENCOUNTER — Encounter: Payer: Self-pay | Admitting: Family Medicine

## 2016-12-18 NOTE — Progress Notes (Signed)
   THERAPIST PROGRESS NOTE  Session Time: 9:10-10am  Participation Level: Active  Behavioral Response: CasualAlertDepressed  Type of Therapy: Individual Therapy  Treatment Goals addressed: Coping  Interventions: Supportive and Reframing  Summary: [name] is a 51 y.o. female who presents for her individual session with depression and grief and irritable. Pt discussed her psychiatric symptoms and current life events. Pt is still struggling with her chronic back pain. She continues with her pain management clinic. Pt reports she is still angry at her fiance's death. Discussed the stages of grief and how they are not linear. Processed her grief with her and steps to move forward in this stage. Pt is struggling with budgeting her $. She is not used to monthly paychecks. Taught pt how to complete a monthly budget. Gave her handout on budgeting to take home. Pt reports she is isolating. Processed with pt her "new normal." Validated pt on her feelings about her new normal. Taught pt more grounding exercises for mindfulness. Pt was receptive during the intervention.   .  Suicidal/Homicidal: Nowithout intent/plan  Therapist Response: Assessed pt's current functioning and reviewed progress. Assisted pt  processing depressive symptoms, new normal, grief,, chronic pain, mindfulness practice and processing for management of stressors.  Plan: Evening OP group and individual sessions as needed  Diagnosis: Axis I: F43.23, F43.20        Kerby Borner S, LCAS  12/11/16

## 2017-01-04 ENCOUNTER — Other Ambulatory Visit (HOSPITAL_COMMUNITY): Payer: Self-pay | Admitting: Psychiatry

## 2017-01-08 ENCOUNTER — Ambulatory Visit (INDEPENDENT_AMBULATORY_CARE_PROVIDER_SITE_OTHER): Payer: 59 | Admitting: Licensed Clinical Social Worker

## 2017-01-08 DIAGNOSIS — F4310 Post-traumatic stress disorder, unspecified: Secondary | ICD-10-CM | POA: Diagnosis not present

## 2017-01-09 ENCOUNTER — Encounter (HOSPITAL_COMMUNITY): Payer: Self-pay | Admitting: Licensed Clinical Social Worker

## 2017-01-09 ENCOUNTER — Other Ambulatory Visit (HOSPITAL_COMMUNITY): Payer: Self-pay

## 2017-01-09 ENCOUNTER — Ambulatory Visit (INDEPENDENT_AMBULATORY_CARE_PROVIDER_SITE_OTHER): Payer: 59 | Admitting: Licensed Clinical Social Worker

## 2017-01-09 DIAGNOSIS — F4323 Adjustment disorder with mixed anxiety and depressed mood: Secondary | ICD-10-CM

## 2017-01-09 DIAGNOSIS — F332 Major depressive disorder, recurrent severe without psychotic features: Secondary | ICD-10-CM
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
## 2017-01-10 ENCOUNTER — Encounter (HOSPITAL_COMMUNITY): Payer: Self-pay | Admitting: Psychiatry

## 2017-01-10 ENCOUNTER — Ambulatory Visit: Payer: Self-pay

## 2017-01-10 ENCOUNTER — Ambulatory Visit (INDEPENDENT_AMBULATORY_CARE_PROVIDER_SITE_OTHER): Payer: 59 | Admitting: Psychiatry

## 2017-01-10 VITALS — BP 124/82 | HR 94 | Ht 68.0 in | Wt 239.0 lb

## 2017-01-10 DIAGNOSIS — G4700 Insomnia, unspecified: Secondary | ICD-10-CM

## 2017-01-10 DIAGNOSIS — F4310 Post-traumatic stress disorder, unspecified: Secondary | ICD-10-CM

## 2017-01-10 DIAGNOSIS — F17210 Nicotine dependence, cigarettes, uncomplicated: Secondary | ICD-10-CM

## 2017-01-10 DIAGNOSIS — F331 Major depressive disorder, recurrent, moderate: Secondary | ICD-10-CM

## 2017-01-10 DIAGNOSIS — F419 Anxiety disorder, unspecified: Secondary | ICD-10-CM | POA: Diagnosis not present

## 2017-01-10 MED ORDER — HYDROXYZINE PAMOATE 50 MG PO CAPS: 50.0000 mg | ORAL_CAPSULE | Freq: Three times a day (TID) | ORAL | 2 refills | Status: DC | PRN

## 2017-01-10 MED ORDER — DESVENLAFAXINE SUCCINATE ER 100 MG PO TB24: 100.0000 mg | ORAL_TABLET | Freq: Every day | ORAL | 2 refills | Status: DC

## 2017-01-10 NOTE — Progress Notes (Signed)
BH MD/PA/NP OP Progress Note  01/10/2017 3:49 PM Melody Jenkins  MRN:  [id number]  Chief Complaint:  Chief Complaint    Follow-up      HPI: Aug 25th is the anniversary of her fiance's death. She has been to his grave twice recently and broke down afterwards. Pt is thinking about him a lot. She is not sleeping well due to pain. When she does fall asleep (after several hours) she has nightmares about her fiance and other people who have passed. She has low energy. She feels the depression is getting worse and her stress tolerance is low. Pt is isolating and stopped going to group for 3 weeks. She restarted last week after Beth called her. During the week she is mostly alone and doesn't have anything to do. On weekends she tries to spend time with friends or family. Pt denies SI/HI.   She feels like she is suffocating on the inside. Anxiety is high.   Taking meds as prescribed and denies SE.   Visit Diagnosis:    ICD-10-CM   1. MDD (major depressive disorder), recurrent episode, moderate (HCC) F33.1 desvenlafaxine (PRISTIQ) 100 MG 24 hr tablet  2. PTSD (post-traumatic stress disorder) F43.10 desvenlafaxine (PRISTIQ) 100 MG 24 hr tablet    hydrOXYzine (VISTARIL) 50 MG capsule      Past Psychiatric History:  Patient endorse history of depression 20 years ago when she was given Lexapro by her primary care physician. She remember a good response from the Lexapro. Patient denies any history of psychiatric inpatient treatment, psychosis, suicidal attempt, mania, hallucination, OCD symptoms.   Previous Psychotropic Medications: Lexapro, Effexor  Substance Abuse History in the last 12 months: No.  Consequences of Substance Abuse: Patient endorse history of drinking alcohol on the weekends and get some time intoxicated but denies any history of seizures, tremors, blackouts or any withdrawal symptoms.  Past Medical History:  Past Medical History:  Diagnosis Date  . Anemia   . Anxiety    . Asthma   . Depression   . History of multiple miscarriages   . Hyperlipidemia     Past Surgical History:  Procedure Laterality Date  . BREAST SURGERY  2000   reduction  . COSMETIC SURGERY  2000   breast reduction    Family Psychiatric and Medical History: Family History  Problem Relation Age of Onset  . Diabetes Mother   . Hypertension Mother   . Hypertension Father   . Heart disease Father   . Early death Brother   . Kidney disease Maternal Grandmother   . Heart disease Brother     Social History:  Social History   Social History  . Marital status: Single    Spouse name: N/A  . Number of children: N/A  . Years of education: N/A   Social History Main Topics  . Smoking status: Current Every Day Smoker    Packs/day: 0.25    Types: Cigarettes  . Smokeless tobacco: Never Used     Comment: Has cut back to 2 a day and trying to quit  . Alcohol use No  . Drug use: No  . Sexual activity: Not Currently    Birth control/ protection: None   Other Topics Concern  . Not on file   Social History Narrative  . No narrative on file    Allergies:  Allergies  Allergen Reactions  . Trazodone And Nefazodone     Metabolic Disorder Labs: Lab Results  Component Value Date   HGBA1C  5.4 04/06/2016   MPG 108 04/06/2016   No results found for: PROLACTIN Lab Results  Component Value Date   CHOL 180 03/05/2016   TRIG 145 03/05/2016   HDL 30 (L) 03/05/2016   CHOLHDL 6.0 (H) 03/05/2016   VLDL 29 03/05/2016   LDLCALC 121 03/05/2016   LDLCALC 113 04/06/2015     Current Medications: Current Outpatient Prescriptions  Medication Sig Dispense Refill  . acetaminophen (TYLENOL) 500 MG tablet Take 500 mg by mouth every 4 (four) hours as needed for moderate pain.    Marland Kitchen albuterol (PROVENTIL HFA;VENTOLIN HFA) 108 (90 BASE) MCG/ACT inhaler Inhale 2 puffs into the lungs every 6 (six) hours as needed for wheezing or shortness of breath.    . Biotin 5000 MCG CAPS Take 1 capsule by  mouth.    . Cyanocobalamin (VITAMIN B 12 PO) Take 1 tablet by mouth daily.     . cyclobenzaprine (FLEXERIL) 10 MG tablet Take 1 tablet (10 mg total) by mouth 3 (three) times daily as needed for muscle spasms. 30 tablet 0  . desvenlafaxine (PRISTIQ) 50 MG 24 hr tablet Take 1 tablet (50 mg total) by mouth daily. 30 tablet 1  . gabapentin (NEURONTIN) 300 MG capsule Take 1 capsule (300 mg total) by mouth at bedtime. 90 capsule 3  . hydrochlorothiazide (HYDRODIURIL) 25 MG tablet Take 1 tablet (25 mg total) by mouth daily. 30 tablet 3  . hydrOXYzine (VISTARIL) 25 MG capsule Take 1 capsule (25 mg total) by mouth 3 (three) times daily as needed. 90 capsule 1  . linaclotide (LINZESS) 145 MCG CAPS capsule Take 1 capsule (145 mcg total) by mouth daily before breakfast. 90 capsule 1  . meloxicam (MOBIC) 15 MG tablet Take 15 mg by mouth 2 (two) times daily.    . nebivolol (BYSTOLIC) 5 MG tablet Take 1 tablet (5 mg total) by mouth daily. 90 tablet 1  . traMADol (ULTRAM) 50 MG tablet Take 50 mg by mouth 2 (two) times daily.    . vitamin C (ASCORBIC ACID) 500 MG tablet Take 500 mg by mouth daily.    . Vitamin D, Cholecalciferol, 1000 UNITS CAPS Take 2 capsules by mouth.      No current facility-administered medications for this visit.      Musculoskeletal: Strength & Muscle Tone: decreased Gait & Station: unsteady, walking with cane Patient leans: N/A  Psychiatric Specialty Exam: Review of Systems  Neurological: Positive for headaches. Negative for dizziness, tingling, tremors and focal weakness.  Psychiatric/Behavioral: Positive for depression. Negative for hallucinations, substance abuse and suicidal ideas. The patient is nervous/anxious and has insomnia.     Blood pressure 124/82, pulse 94, height 5\' 8"  (1.727 m), weight 239 lb (108.4 kg), SpO2 99 %.Body mass index is 36.34 kg/m.  General Appearance: Casual  Eye Contact:  Fair  Speech:  Clear and Coherent and Normal Rate  Volume:  Normal  Mood:   Anxious and Depressed  Affect:  Congruent, Depressed and Tearful  Thought Process:  Coherent and Descriptions of Associations: Circumstantial  Orientation:  Full (Time, Place, and Person)  Thought Content: Rumination   Suicidal Thoughts:  No  Homicidal Thoughts:  No  Memory:  Immediate;   Good Recent;   Good Remote;   Good  Judgement:  Good  Insight:  Good  Psychomotor Activity:  Normal  Concentration:  Concentration: Fair and Attention Span: Fair  Recall:  [employer] of Knowledge: Good  Language: Fair  Akathisia:  No  Handed:  Right  AIMS (if indicated):  n/a  Assets:  Communication Skills Desire for Improvement Housing Social Support  ADL's:  Intact  Cognition: WNL  Sleep:  poor     Treatment Plan Summary:Medication management  Assessment: MDD-recurrent, moderate; PTSD   Medication management with supportive therapy. Risks/benefits and SE of the medication discussed. Pt verbalized understanding and verbal consent obtained for treatment.  Affirm with the patient that the medications are taken as ordered. Patient expressed understanding of how their medications were to be used.   Meds: increase Pristiq 100mg  po qD for depression and PTSD Vistaril 25mg  po TID prn anxiety   Labs: none  Therapy: brief supportive therapy provided. Discussed psychosocial stressors in detail.   -Discussed volunteer work as a way to add structure to her day -discussed ways to deal with the 1st anniversary of her fiance's death  Consultations: none  Pt denies SI and is at an acute low risk for suicide. Patient told to call clinic if any problems occur. Patient advised to go to ER if they should develop SI/HI, side effects, or if symptoms worsen. Has crisis numbers to call if needed. Pt verbalized understanding.  F/up in 2 months or sooner if needed   Charlcie Cradle, MD 01/10/2017, 3:49 PM

## 2017-01-11 ENCOUNTER — Encounter: Payer: Self-pay | Admitting: Gastroenterology

## 2017-01-14 NOTE — Progress Notes (Signed)
Daily Group Progress Note     Program: OP   Group Time: 5:30-6:30  Participation Level: Active  Behavioral Response: Appropriate  Type of Therapy:  Psychotherapy; Group therapy  Summary of Progress: Pt presented with anxious affect and depressed mood. Pt shared at length difficulties she has with acceptance of her physical pain. Pt accepted feedback from other group members. Pt engaged in discussion topic of accepting where we are versus where we thought we would be in life. Pt denies SI/HI.    Lorin Glass MSW, LCSW, LCAS

## 2017-01-15 ENCOUNTER — Ambulatory Visit (INDEPENDENT_AMBULATORY_CARE_PROVIDER_SITE_OTHER): Payer: 59 | Admitting: Licensed Clinical Social Worker

## 2017-01-15 DIAGNOSIS — F331 Major depressive disorder, recurrent, moderate: Secondary | ICD-10-CM

## 2017-01-16 ENCOUNTER — Encounter (HOSPITAL_COMMUNITY): Payer: Self-pay | Admitting: Licensed Clinical Social Worker

## 2017-01-16 NOTE — Progress Notes (Signed)
Daily Group Progress Note     Program: OP   Group Time: 5:30-6:30  Participation Level: Active  Behavioral Response: Appropriate  Type of Therapy:  Psychoeducation/Therapy  Summary of Progress: Pt participated in a discussion on adjusting to new life experiences. Pt shared 8/25 is the 1 year anniversary of her fiance's death. She was tearful in explaining her past year and how she has had to adjust to her new normal. Pt was given positive feedback from the group. Pt was encouraged to continue using her coping skills and reach out to ask for help during this time.  [name], LCAS

## 2017-01-21 ENCOUNTER — Encounter (HOSPITAL_COMMUNITY): Payer: Self-pay | Admitting: Licensed Clinical Social Worker

## 2017-01-21 ENCOUNTER — Ambulatory Visit (INDEPENDENT_AMBULATORY_CARE_PROVIDER_SITE_OTHER): Payer: 59 | Admitting: Licensed Clinical Social Worker

## 2017-01-21 DIAGNOSIS — F331 Major depressive disorder, recurrent, moderate: Secondary | ICD-10-CM | POA: Diagnosis not present
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
## 2017-01-24 ENCOUNTER — Encounter: Payer: Self-pay | Admitting: Family Medicine

## 2017-01-24 ENCOUNTER — Ambulatory Visit
Admission: RE | Admit: 2017-01-24 | Discharge: 2017-01-24 | Disposition: A | Discharge status: Home / Self Care | Payer: 59 | Source: Ambulatory Visit | Attending: Family Medicine | Admitting: Family Medicine

## 2017-01-24 DIAGNOSIS — Z1231 Encounter for screening mammogram for malignant neoplasm of breast: Secondary | ICD-10-CM

## 2017-01-29 ENCOUNTER — Encounter: Payer: Self-pay | Admitting: Family Medicine

## 2017-01-29 ENCOUNTER — Ambulatory Visit (INDEPENDENT_AMBULATORY_CARE_PROVIDER_SITE_OTHER): Payer: 59 | Admitting: Licensed Clinical Social Worker

## 2017-01-29 DIAGNOSIS — F4320 Adjustment disorder, unspecified: Secondary | ICD-10-CM | POA: Diagnosis not present

## 2017-01-29 DIAGNOSIS — F4321 Adjustment disorder with depressed mood: Secondary | ICD-10-CM

## 2017-01-29 DIAGNOSIS — F331 Major depressive disorder, recurrent, moderate: Secondary | ICD-10-CM

## 2017-01-29 MED ORDER — NEBIVOLOL HCL 10 MG PO TABS
10.0000 mg | ORAL_TABLET | Freq: Every day | ORAL | 1 refills | Status: DC
Start: 2017-01-29 — End: 2017-04-04

## 2017-01-30 ENCOUNTER — Encounter (HOSPITAL_COMMUNITY): Payer: Self-pay | Admitting: Licensed Clinical Social Worker

## 2017-01-30 NOTE — Progress Notes (Signed)
Daily Group Progress Note     Program: OP   Group Time: 5:30-6:30  Participation Level: Active  Behavioral Response: Appropriate  Type of Therapy:  Psychoeducation/Therapy  Summary of Progress: Pt participated in a discussion on choosing happiness. Pt is learning her new normal. The anniversary of her fiance's death is this week. Pt asked for suggestions on how to get through the day. Pt was encouraged by her fellow group members to ask for others to join her in a celebration of his life.   [name], LCAS

## 2017-02-04 ENCOUNTER — Encounter (HOSPITAL_COMMUNITY): Payer: Self-pay | Admitting: Licensed Clinical Social Worker

## 2017-02-04 ENCOUNTER — Ambulatory Visit (INDEPENDENT_AMBULATORY_CARE_PROVIDER_SITE_OTHER): Payer: 59 | Admitting: Licensed Clinical Social Worker

## 2017-02-04 DIAGNOSIS — F4320 Adjustment disorder, unspecified: Secondary | ICD-10-CM

## 2017-02-04 DIAGNOSIS — F331 Major depressive disorder, recurrent, moderate: Secondary | ICD-10-CM | POA: Diagnosis not present

## 2017-02-04 DIAGNOSIS — F4321 Adjustment disorder with depressed mood: Secondary | ICD-10-CM

## 2017-02-04 NOTE — Progress Notes (Signed)
   THERAPIST PROGRESS NOTE  Session Time: 9:10-10am  Participation Level: Active  Behavioral Response: CasualAlertDepressed  Type of Therapy: Individual Therapy  Treatment Goals addressed: Coping  Interventions: Supportive and Reframing  Summary: Melody Jenkins is a 51 y.o. female who presents for her individual session with depression and grief. Pt discussed her psychiatric symptoms and current life events. The anniversary of her fiance's death was Oct 06, 2022. Pt shared she was actually ok and spent time with friends. Pt has begun journaling which she says helps her between therapy sessions.Pt is still struggling with her chronic back pain. She continues with her pain management clinic. Pt discussed her relationship with her daughter who is gay. Pt does not accept her lifestyle, but she is her only child. Asked pt open ended questions about her relationship. Taught pt emotional regulation skill Distraction. Pt completed a worksheet and discussed the skill. Still continue to discuss with pt her new "normal." Pt shared she is sill walking her property and meditating daily.      Suicidal/Homicidal: Nowithout intent/plan  Therapist Response: Assessed pt's current functioning and reviewed progress. Assisted pt  processing grief, new normal, grief,, chronic pain, emotional regulation skill,  mindfulness practices and processing for management of stressors.  Plan: Evening OP group and individual sessions biweekly.  Diagnosis: Axis I: F43.23, F43.20        [place], LCAS  02/04/17

## 2017-02-05 ENCOUNTER — Ambulatory Visit (INDEPENDENT_AMBULATORY_CARE_PROVIDER_SITE_OTHER): Payer: 59 | Admitting: Licensed Clinical Social Worker

## 2017-02-05 DIAGNOSIS — F331 Major depressive disorder, recurrent, moderate: Secondary | ICD-10-CM | POA: Diagnosis not present

## 2017-02-06 ENCOUNTER — Encounter (HOSPITAL_COMMUNITY): Payer: Self-pay | Admitting: Licensed Clinical Social Worker

## 2017-02-06 NOTE — Progress Notes (Signed)
Daily Group Progress Note     Program: OP   Group Time: 5:30-6:30  Participation Level: Active  Behavioral Response: Appropriate  Type of Therapy:  Psychoeducation/Therapy  Summary of Progress: Pt participated in a discussion on happiness in mental health recovery. Pt participated in the happiness scale 1-10, with 10 being the most happy, identifying her happiness at 5. Pt identified her growth in mental health recovery has come from his acceptance, reinforcement and support. Pt was encouraged to continue working on her own happiness that comes from within.  [name], LCAS

## 2017-02-12 ENCOUNTER — Ambulatory Visit (HOSPITAL_COMMUNITY): Payer: Self-pay | Admitting: Licensed Clinical Social Worker

## 2017-02-12 ENCOUNTER — Encounter: Payer: Self-pay | Admitting: Family Medicine

## 2017-02-13 ENCOUNTER — Encounter: Payer: Self-pay | Admitting: Family Medicine

## 2017-02-13 ENCOUNTER — Ambulatory Visit (INDEPENDENT_AMBULATORY_CARE_PROVIDER_SITE_OTHER): Payer: 59 | Admitting: Family Medicine

## 2017-02-13 VITALS — BP 130/82 | HR 75 | Temp 97.9°F | Resp 16 | Ht 68.0 in | Wt 244.2 lb

## 2017-02-13 DIAGNOSIS — Z6837 Body mass index (BMI) 37.0-37.9, adult: Secondary | ICD-10-CM | POA: Diagnosis not present

## 2017-02-13 DIAGNOSIS — E7439 Other disorders of intestinal carbohydrate absorption: Secondary | ICD-10-CM

## 2017-02-13 DIAGNOSIS — E785 Hyperlipidemia, unspecified: Secondary | ICD-10-CM | POA: Insufficient documentation

## 2017-02-13 DIAGNOSIS — M7989 Other specified soft tissue disorders: Secondary | ICD-10-CM | POA: Diagnosis not present

## 2017-02-13 DIAGNOSIS — I10 Essential (primary) hypertension: Secondary | ICD-10-CM

## 2017-02-13 DIAGNOSIS — E119 Type 2 diabetes mellitus without complications: Secondary | ICD-10-CM | POA: Insufficient documentation

## 2017-02-13 DIAGNOSIS — E1169 Type 2 diabetes mellitus with other specified complication: Secondary | ICD-10-CM | POA: Insufficient documentation

## 2017-02-13 MED ORDER — FUROSEMIDE 20 MG PO TABS
20.0000 mg | ORAL_TABLET | Freq: Every day | ORAL | 3 refills | Status: DC
Start: 2017-02-13 — End: 2017-05-15
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
## 2017-02-13 NOTE — Assessment & Plan Note (Signed)
Improved with the bi-systolic at 10 mg. As she does have some peripheral edema which I think is more from obesity and the food that she is eating again I have her stop the hydrochlorothiazide and take Lasix 20 mg if her feet stay down to maybe to switch her back over to the hydrochlorothiazide. We discussed dietary changes as well. Abdomen to check her labs that she's also had some glucose intolerance in the past. She did have normal A1c a year ago.

## 2017-02-13 NOTE — Patient Instructions (Addendum)
Hold the HCTZ, start lasix  Continue the bystolic  F/U 3 months

## 2017-02-14 LAB — CBC WITH DIFFERENTIAL/PLATELET
BASOS ABS: 39 {cells}/uL (ref 0–200)
BASOS PCT: 0.5 %
EOS ABS: 393 {cells}/uL (ref 15–500)
Eosinophils Relative: 5.1 %
HCT: 42.7 % (ref 35.0–45.0)
HEMOGLOBIN: 13.7 g/dL (ref 11.7–15.5)
Lymphs Abs: 3442 cells/uL (ref 850–3900)
MCH: 26.9 pg — AB (ref 27.0–33.0)
MCHC: 32.1 g/dL (ref 32.0–36.0)
MCV: 83.7 fL (ref 80.0–100.0)
MPV: 10.2 fL (ref 7.5–12.5)
Monocytes Relative: 7.4 %
Neutro Abs: 3257 cells/uL (ref 1500–7800)
Neutrophils Relative %: 42.3 %
PLATELETS: 276 10*3/uL (ref 140–400)
RBC: 5.1 10*6/uL (ref 3.80–5.10)
RDW: 14.3 % (ref 11.0–15.0)
TOTAL LYMPHOCYTE: 44.7 %
WBC: 7.7 10*3/uL (ref 3.8–10.8)
WBCMIX: 570 {cells}/uL (ref 200–950)

## 2017-02-14 LAB — BASIC METABOLIC PANEL WITH GFR
BUN: 16 mg/dL (ref 7–25)
CHLORIDE: 101 mmol/L (ref 98–110)
CO2: 33 mmol/L — AB (ref 20–32)
Calcium: 9.5 mg/dL (ref 8.6–10.4)
Creat: 0.91 mg/dL (ref 0.50–1.05)
GFR, EST NON AFRICAN AMERICAN: 73 mL/min/{1.73_m2} (ref >=60)
GFR, Est African American: 84 mL/min/{1.73_m2} (ref >=60)
GLUCOSE: 99 mg/dL (ref 65–99)
POTASSIUM: 4.3 mmol/L (ref 3.5–5.3)
Sodium: 138 mmol/L (ref 135–146)

## 2017-02-14 LAB — HEMOGLOBIN A1C
Hgb A1c MFr Bld: 6 % of total Hgb — ABNORMAL HIGH (ref <5.7)
Mean Plasma Glucose: 126 (calc)
eAG (mmol/L): 7 (calc)

## 2017-02-18 ENCOUNTER — Encounter (HOSPITAL_COMMUNITY): Payer: Self-pay | Admitting: Licensed Clinical Social Worker

## 2017-02-18 ENCOUNTER — Ambulatory Visit (INDEPENDENT_AMBULATORY_CARE_PROVIDER_SITE_OTHER): Payer: 59 | Admitting: Licensed Clinical Social Worker

## 2017-02-18 DIAGNOSIS — F331 Major depressive disorder, recurrent, moderate: Secondary | ICD-10-CM

## 2017-02-18 NOTE — Progress Notes (Signed)
   THERAPIST PROGRESS NOTE  Session Time: 9:10-10am  Participation Level: Active  Behavioral Response: CasualAlertDepressed  Type of Therapy: Individual Therapy  Treatment Goals addressed: Coping  Interventions: Supportive and Reframing  Summary: Melody Jenkins is a 51 y.o. female who presents for her individual session with depression and grief. Pt discussed her psychiatric symptoms and current life events. Pts birthday was this past week and she was more depressed than usual. Asked open ended questions about what she had planned for the day. Pt  continues journaling which she says helps her between therapy sessions. She reports it helps her in dealing with her pain and life issues. She continues tol struggle with her chronic back pain. She continues with her pain management clinic.  Asked open ended questions about her quality of life. Pt feels the chronic pain route is her only choice. Processed choices with pt. Showed pt podcasts on dealing with chronic pain and Youtube meditation for chronic pain. Still continue to discuss with pt her new "normal."    Suicidal/Homicidal: Nowithout intent/plan  Therapist Response: Assessed pt's current functioning and reviewed progress. Assisted pt  Processing depressive symptoms, grief, new normal, chronic pain, podcasts, and processing for management of stressors.  Plan: Evening OP group and individual sessions biweekly.  Diagnosis: Axis I: F43.23, F43.20        [name], [place]

## 2017-02-19 ENCOUNTER — Ambulatory Visit (HOSPITAL_COMMUNITY): Payer: Self-pay | Admitting: Licensed Clinical Social Worker

## 2017-02-26 ENCOUNTER — Ambulatory Visit (INDEPENDENT_AMBULATORY_CARE_PROVIDER_SITE_OTHER): Payer: 59 | Admitting: Licensed Clinical Social Worker

## 2017-02-26 ENCOUNTER — Encounter (HOSPITAL_COMMUNITY): Payer: Self-pay | Admitting: Licensed Clinical Social Worker

## 2017-02-26 ENCOUNTER — Telehealth: Payer: Self-pay | Admitting: *Deleted

## 2017-02-26 ENCOUNTER — Telehealth (HOSPITAL_COMMUNITY): Payer: Self-pay

## 2017-02-26 DIAGNOSIS — F4320 Adjustment disorder, unspecified: Secondary | ICD-10-CM

## 2017-02-26 DIAGNOSIS — F331 Major depressive disorder, recurrent, moderate: Secondary | ICD-10-CM | POA: Diagnosis not present

## 2017-02-26 DIAGNOSIS — F4321 Adjustment disorder with depressed mood: Secondary | ICD-10-CM

## 2017-02-26 NOTE — Telephone Encounter (Signed)
Received VM from patient.   Reports that BP remains elevated. States that highest she has noted is 140/101. Reports that she is currently on Prestiq, and has been advised that it may increase BP. States that she will discuss medication with [facility].   Call placed to patient to inquire. [place].

## 2017-02-26 NOTE — Telephone Encounter (Signed)
What some other readings, is she still taking the lasix? Has swelling done down.

## 2017-02-26 NOTE — Telephone Encounter (Signed)
Patient is calling because her blood pressure has been elevated, she is working with her PCP on getting it down, however she can not get the bottom number under 90 - she is on Pristiq which can cause blood pressure increases and she is wondering if she should change that to another medication. Please review and advise, thank you

## 2017-02-27 ENCOUNTER — Encounter (HOSPITAL_COMMUNITY): Payer: Self-pay | Admitting: Licensed Clinical Social Worker

## 2017-02-27 NOTE — Telephone Encounter (Signed)
Call placed to patient. LMTRC.   MyChart message sent to patient.  
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
## 2017-03-04 ENCOUNTER — Ambulatory Visit (HOSPITAL_COMMUNITY): Payer: Self-pay | Admitting: Licensed Clinical Social Worker

## 2017-03-05 ENCOUNTER — Ambulatory Visit (INDEPENDENT_AMBULATORY_CARE_PROVIDER_SITE_OTHER): Payer: 59 | Admitting: Licensed Clinical Social Worker

## 2017-03-05 ENCOUNTER — Telehealth (HOSPITAL_COMMUNITY): Payer: Self-pay | Admitting: *Deleted

## 2017-03-05 ENCOUNTER — Telehealth: Payer: Self-pay | Admitting: Family Medicine

## 2017-03-05 DIAGNOSIS — F4323 Adjustment disorder with mixed anxiety and depressed mood: Secondary | ICD-10-CM

## 2017-03-05 DIAGNOSIS — F331 Major depressive disorder, recurrent, moderate: Secondary | ICD-10-CM

## 2017-03-05 NOTE — Telephone Encounter (Signed)
Pt calling she is out of her bystolic 10mg  and wants to know if she can get samples she doesn't get her disability check until the 3rd and wont be able to get it filled until then.

## 2017-03-05 NOTE — Telephone Encounter (Signed)
Samples left up front for patient.   Call placed to patient and patient made aware.

## 2017-03-05 NOTE — Telephone Encounter (Signed)
Medication problem - Telephone call with patient as she states she did leave paperwork for Dr Doyne Keel to complete the previous week and has not heard back about it nor her concern the Pristiq she is now taking may be causing increased BP.  Agreed to send noet to Dr. Doyne Keel to follow up and to question if she had paperwork to complete.  Paperwork located and left for Dr. Doyne Keel to sign upon her return in her mailbox.

## 2017-03-05 NOTE — Telephone Encounter (Signed)
Patient called asking if her paperwork has been signed yet that she left last week for her Dr. Also states she left a message last week asking if her medication (Pristiq) is causing her BP to stay elevated? States it stays around 151/100. Does want her Dr. To be aware and let her know if she should continue taking it or not.

## 2017-03-07 ENCOUNTER — Other Ambulatory Visit: Payer: Self-pay | Admitting: Family Medicine

## 2017-03-07 ENCOUNTER — Encounter (HOSPITAL_COMMUNITY): Payer: Self-pay | Admitting: Licensed Clinical Social Worker
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
## 2017-03-07 NOTE — Telephone Encounter (Signed)
Spoke with patient. She states her BP is elevated and states despite increase in Bystolic. Pt is concerned that is it is due to Pristiq.   P: advised pt to decrease Pristiq 50mg  po qD and monitor for changes in BP

## 2017-03-08 NOTE — Telephone Encounter (Signed)
Medication refilled per protocol. 

## 2017-03-12 ENCOUNTER — Encounter: Payer: Self-pay | Admitting: Family Medicine

## 2017-03-19 ENCOUNTER — Ambulatory Visit (INDEPENDENT_AMBULATORY_CARE_PROVIDER_SITE_OTHER): Payer: 59 | Admitting: Licensed Clinical Social Worker

## 2017-03-19 DIAGNOSIS — F331 Major depressive disorder, recurrent, moderate: Secondary | ICD-10-CM

## 2017-03-20 ENCOUNTER — Encounter (HOSPITAL_COMMUNITY): Payer: Self-pay | Admitting: Licensed Clinical Social Worker

## 2017-03-20 NOTE — Progress Notes (Signed)
   THERAPIST PROGRESS NOTE  Session Time: 5:30-6:20pm  Participation Level: Active  Behavioral Response: CasualAlertDepressed  Type of Therapy: Individual Therapy  Treatment Goals addressed: Coping  Interventions: Supportive and Reframing  Summary: Melody Jenkins is a 51 y.o. female who presents for her group this evening. She was the only member present so saw her individually.. Pt discussed her psychiatric symptoms and current life events. Pt will no  Longer be coming to group until a new group has started. Pt was solemn during session. Yesterday     Was the anniversary of pt's fiance being buried. Asked open ended questions about her life 1 year later. Pt was able to talk about her feelings and status now 1 year later. Pt reported she didn't used to think she could live through her fiances death. Pt is also in chronic pain constantly on top of her grief and depression. Pt reports she feels more grounded and was surprised how she managed to get through the anniversary yesterday. Pt reported she used her grounding exercises, her meditation and her journaling, where she was able to write about her feelings of the day. Taught pt more grounding techniques and how these techniques may assist in relaxation. Pt was tearful during the session and her affect was depressed. At the end of the session she reported it felt good to talk about her feelings.   Suicidal/Homicidal: Nowithout intent/plan  Therapist Response: Assessed pt's current functioning and reviewed progress. Assisted pt  Processing depressive symptoms, grief, new normal, chronic pain,  and processing for management of stressors.  Plan: ndividual sessions biweekly.  Diagnosis: Axis I: F43.23, F43.20        [place], LCAS  03/19/17

## 2017-03-21 ENCOUNTER — Ambulatory Visit (HOSPITAL_COMMUNITY): Payer: Self-pay | Admitting: Psychiatry

## 2017-03-26 ENCOUNTER — Ambulatory Visit (HOSPITAL_COMMUNITY): Payer: Self-pay | Admitting: Licensed Clinical Social Worker

## 2017-03-26 DIAGNOSIS — R200 Anesthesia of skin: Secondary | ICD-10-CM | POA: Insufficient documentation

## 2017-04-02 ENCOUNTER — Ambulatory Visit (HOSPITAL_COMMUNITY): Payer: Self-pay | Admitting: Licensed Clinical Social Worker

## 2017-04-04 ENCOUNTER — Other Ambulatory Visit (HOSPITAL_COMMUNITY): Payer: Self-pay | Admitting: Psychiatry

## 2017-04-04 ENCOUNTER — Other Ambulatory Visit: Payer: Self-pay | Admitting: Family Medicine

## 2017-04-04 DIAGNOSIS — F331 Major depressive disorder, recurrent, moderate: Secondary | ICD-10-CM

## 2017-04-04 DIAGNOSIS — F4310 Post-traumatic stress disorder, unspecified: Secondary | ICD-10-CM

## 2017-04-09 ENCOUNTER — Ambulatory Visit (HOSPITAL_COMMUNITY): Payer: Self-pay | Admitting: Licensed Clinical Social Worker

## 2017-04-09 DIAGNOSIS — G5600 Carpal tunnel syndrome, unspecified upper limb: Secondary | ICD-10-CM | POA: Insufficient documentation

## 2017-04-11 ENCOUNTER — Ambulatory Visit (INDEPENDENT_AMBULATORY_CARE_PROVIDER_SITE_OTHER): Payer: 59 | Admitting: Psychiatry

## 2017-04-11 ENCOUNTER — Encounter: Payer: Self-pay | Admitting: Family Medicine

## 2017-04-11 ENCOUNTER — Encounter (HOSPITAL_COMMUNITY): Payer: Self-pay | Admitting: Psychiatry

## 2017-04-11 ENCOUNTER — Ambulatory Visit (HOSPITAL_COMMUNITY): Payer: Self-pay | Admitting: Psychiatry

## 2017-04-11 VITALS — BP 132/80 | HR 83 | Ht 67.25 in | Wt 248.8 lb

## 2017-04-11 DIAGNOSIS — G5600 Carpal tunnel syndrome, unspecified upper limb: Secondary | ICD-10-CM | POA: Diagnosis not present

## 2017-04-11 DIAGNOSIS — G4701 Insomnia due to medical condition: Secondary | ICD-10-CM | POA: Diagnosis not present

## 2017-04-11 DIAGNOSIS — F17210 Nicotine dependence, cigarettes, uncomplicated: Secondary | ICD-10-CM

## 2017-04-11 DIAGNOSIS — F331 Major depressive disorder, recurrent, moderate: Secondary | ICD-10-CM | POA: Diagnosis not present

## 2017-04-11 DIAGNOSIS — F4310 Post-traumatic stress disorder, unspecified: Secondary | ICD-10-CM | POA: Diagnosis not present

## 2017-04-11 DIAGNOSIS — M2550 Pain in unspecified joint: Secondary | ICD-10-CM

## 2017-04-11 DIAGNOSIS — M549 Dorsalgia, unspecified: Secondary | ICD-10-CM

## 2017-04-11 MED ORDER — HYDROXYZINE PAMOATE 50 MG PO CAPS: 50.0000 mg | ORAL_CAPSULE | Freq: Three times a day (TID) | ORAL | 2 refills | Status: DC | PRN

## 2017-04-11 MED ORDER — DESVENLAFAXINE SUCCINATE ER 50 MG PO TB24: 50.0000 mg | ORAL_TABLET | Freq: Every day | ORAL | 2 refills | Status: DC

## 2017-04-11 MED ORDER — MIRTAZAPINE 15 MG PO TABS: 15.0000 mg | ORAL_TABLET | Freq: Every day | ORAL | 2 refills | Status: DC

## 2017-04-11 NOTE — Progress Notes (Signed)
Melody Jenkins/PA/NP OP Progress Note  04/11/2017 11:28 AM Melody Jenkins  MRN:  [id number]  Chief Complaint:  Chief Complaint    Follow-up     HPI: Pt has severe carpal tunnel syndrome causing pain, swelling, numbness in her hands. Pt reports she has surgery next week.  Pt has filed for her disability.   She states last month was bad because Pristiq was cut back. Pt states BP has improved since. At higher doses her mood and nightmares were better. She is now experiencing poor sleep (unable to fall asleep), bad dreams several times a week, sad mood, feeling stressed and overwhelmed by her pain. The pain prevents her from doing so many things- cooking, cleaning, going out. Pt denies hopelessness. Pt denies SI/HI.  Pt is taking Vistaril TID and it helps with her anxiety. Pt reports increase in HV. Pt has on/off intrusive memories especially at the beginning of the month.   Visit Diagnosis:    ICD-10-CM   1. PTSD (post-traumatic stress disorder) F43.10   2. MDD (major depressive disorder), recurrent episode, moderate ([place]) F33.1       Past Psychiatric History:  Patient endorse history of depression 20 years ago when she was given Lexapro by her primary care physician. She remember a good response from the Lexapro. Patient denies any history of psychiatric inpatient treatment, psychosis, suicidal attempt, mania, hallucination, OCD symptoms.   Previous Psychotropic Medications:Lexapro, Effexor  Substance Abuse History in the last 12 months: No.  Consequences of Substance Abuse: Patient endorse history of drinking alcohol on the weekends and get some time intoxicated but denies any history of seizures, tremors, blackouts or any withdrawal symptoms.  Past Medical History:  Past Medical History:  Diagnosis Date  . Anemia   . Anxiety   . Asthma   . Carpal tunnel syndrome   . Depression   . History of multiple miscarriages   . Hyperlipidemia     Past Surgical History:  Procedure  Laterality Date  . BREAST SURGERY  2000   reduction  . COSMETIC SURGERY  2000   breast reduction  . REDUCTION MAMMAPLASTY Bilateral     Family Psychiatric History:  Family History  Problem Relation Age of Onset  . Diabetes Mother   . Hypertension Mother   . Hypertension Father   . Heart disease Father   . Early death Brother   . Kidney disease Maternal Grandmother   . Heart disease Brother     Social History:  Social History   Social History  . Marital status: Single    Spouse name: N/A  . Number of children: N/A  . Years of education: N/A   Social History Main Topics  . Smoking status: Current Every Day Smoker    Packs/day: 0.25    Years: 30.00    Types: Cigarettes  . Smokeless tobacco: Never Used  . Alcohol use No  . Drug use: No  . Sexual activity: Not Currently    Birth control/ protection: None   Other Topics Concern  . None   Social History Narrative  . None    Allergies:  Allergies  Allergen Reactions  . Trazodone And Nefazodone     Metabolic Disorder Labs: Lab Results  Component Value Date   HGBA1C 6.0 (H) 02/13/2017   MPG 126 02/13/2017   MPG 108 04/06/2016   No results found for: PROLACTIN Lab Results  Component Value Date   CHOL 180 03/05/2016   TRIG 145 03/05/2016   HDL 30 (L)  03/05/2016   CHOLHDL 6.0 (H) 03/05/2016   VLDL 29 03/05/2016   LDLCALC 121 03/05/2016   LDLCALC 113 04/06/2015   Lab Results  Component Value Date   TSH 0.83 11/07/2016   TSH 0.59 03/05/2016    Therapeutic Level Labs: No results found for: LITHIUM No results found for: VALPROATE No components found for:  CBMZ  Current Medications: Current Outpatient Prescriptions  Medication Sig Dispense Refill  . Biotin 5000 MCG CAPS Take 1 capsule by mouth.    . BYSTOLIC 10 MG tablet TAKE 1 TABLET BY MOUTH EVERY DAY 30 tablet 1  . Cyanocobalamin (VITAMIN B 12 PO) Take 1 tablet by mouth daily.     . cyclobenzaprine (FLEXERIL) 10 MG tablet Take 1 tablet (10 mg  total) by mouth 3 (three) times daily as needed for muscle spasms. 30 tablet 0  . desvenlafaxine (PRISTIQ) 100 MG 24 hr tablet TAKE 1 TABLET BY MOUTH EVERY DAY (Patient taking differently: TAKE 1/2 TABLET BY MOUTH EVERY DAY) 7 tablet 0  . furosemide (LASIX) 20 MG tablet Take 1 tablet (20 mg total) by mouth daily. 30 tablet 3  . gabapentin (NEURONTIN) 300 MG capsule Take 1 capsule (300 mg total) by mouth at bedtime. (Patient taking differently: Take 300 mg by mouth at bedtime. ) 90 capsule 3  . hydrOXYzine (VISTARIL) 50 MG capsule Take 1 capsule (50 mg total) by mouth 3 (three) times daily as needed. 90 capsule 2  . meloxicam (MOBIC) 15 MG tablet Take 15 mg by mouth 2 (two) times daily.    . traMADol (ULTRAM) 50 MG tablet Take 50 mg by mouth 2 (two) times daily.    . vitamin C (ASCORBIC ACID) 500 MG tablet Take 500 mg by mouth daily.    . Vitamin D, Cholecalciferol, 1000 UNITS CAPS Take 2 capsules by mouth.     Marland Kitchen albuterol (PROVENTIL HFA;VENTOLIN HFA) 108 (90 BASE) MCG/ACT inhaler Inhale 2 puffs into the lungs every 6 (six) hours as needed for wheezing or shortness of breath.    . hydrochlorothiazide (HYDRODIURIL) 25 MG tablet TAKE 1 TABLET BY MOUTH EVERY DAY (Patient not taking: Reported on 04/11/2017) 90 tablet 0   No current facility-administered medications for this visit.      Musculoskeletal: Strength & Muscle Tone: within normal limits Gait & Station: normal Patient leans: N/A  Psychiatric Specialty Exam: Review of Systems  Musculoskeletal: Positive for back pain and joint pain. Negative for neck pain.  Neurological: Positive for tingling and sensory change. Negative for tremors and speech change.    Blood pressure 132/80, pulse 83, height 5' 7.25" (1.708 m), weight 248 lb 12.8 oz (112.9 kg).Body mass index is 38.68 kg/m.  General Appearance: Fairly Groomed  Eye Contact:  Good  Speech:  Clear and Coherent and Normal Rate  Volume:  Normal  Mood:  Anxious and Depressed  Affect:   Congruent  Thought Process:  Goal Directed and Descriptions of Associations: Intact  Orientation:  Full (Time, Place, and Person)  Thought Content: Logical   Suicidal Thoughts:  No  Homicidal Thoughts:  No  Memory:  Immediate;   Fair Recent;   Fair Remote;   Fair  Judgement:  Fair  Insight:  Present  Psychomotor Activity:  Normal  Concentration:  Concentration: Good and Attention Span: Good  Recall:  Good  Fund of Knowledge: Good  Language: Good  Akathisia:  No  Handed:  Right  AIMS (if indicated): not done  Assets:  Communication Skills Desire for Improvement Housing  ADL's:  Intact  Cognition: WNL  Sleep:  Poor   Screenings: GAD-7     Office Visit from 02/06/2016 in [facility]  Total GAD-7 Score  21    PHQ2-9     Office Visit from 02/13/2017 in [facility] Office Visit from 11/20/2016 in [facility] Office Visit from 11/07/2016 in [facility] Office Visit from 12/26/2015 in [facility] [facility]  PHQ-2 Total Score  4  4  4  2   PHQ-9 Total Score  18  22  22  7        Assessment and Plan: MDD-recurrent, moderate; PTSD   Medication management with supportive therapy. Risks/benefits and SE of the medication discussed. Pt verbalized understanding and verbal consent obtained for treatment.  Affirm with the patient that the medications are taken as ordered. Patient expressed understanding of how their medications were to be used.   Meds: decrease Pristiq 50mg  po qD for depression and PTSD (decrease due to BP issues) Vistaril 50mg  po TID prn anxiety Start trial of Remeron 15mg  po qHS for depression, anxiety and sleep  Labs: none  Therapy: brief supportive therapy provided. Discussed psychosocial stressors in detail.     Consultations: encouraged to continue therapy  Pt denies SI and is at an acute low risk for suicide. Patient told to call clinic if any problems occur. Patient advised to go to ER if  they should develop SI/HI, side effects, or if symptoms worsen. Has crisis numbers to call if needed. Pt verbalized understanding.  F/up in 2 months or sooner if needed   Charlcie Cradle, Jenkins 04/11/2017, 11:28 AM

## 2017-04-22 ENCOUNTER — Ambulatory Visit (INDEPENDENT_AMBULATORY_CARE_PROVIDER_SITE_OTHER): Payer: 59 | Admitting: Licensed Clinical Social Worker

## 2017-04-22 DIAGNOSIS — F331 Major depressive disorder, recurrent, moderate: Secondary | ICD-10-CM

## 2017-04-23 ENCOUNTER — Encounter (HOSPITAL_COMMUNITY): Payer: Self-pay | Admitting: Licensed Clinical Social Worker

## 2017-04-23 NOTE — Progress Notes (Signed)
   THERAPIST PROGRESS NOTE  Session Time: 9:10-10am  Participation Level: Active  Behavioral Response: CasualAlertDepressed  Type of Therapy: Individual Therapy  Treatment Goals addressed: Coping  Interventions: Supportive and Reframing  Summary: Melody Jenkins is a 51 y.o. female presenting for her individual counseling session. Pt discussed her psychiatric symptoms and current life events. Pt reports her depressive symptoms have stabilized. She is still experiencing chronic pain, which has intensified due to her new carpal tunnel symptoms. She is due to have surgery next week. Because of the surgery she has had to come off some of her meds which has intensified her back pain. Pt saw her psychiatrist, who increased one of her meds. Pt continues to watch chronic pain podcasts which assist with her coping with her chronic pain. Pt also uses meditation for coping with her depression and anxiety. Pt participated in a meditation in session which assisted with her stress in discussing her upcoming surgery.    Suicidal/Homicidal: Nowithout intent/plan  Therapist Response: Assessed pt's current functioning and reviewed progress. Assisted pt  Processing depressive symptoms, upcoming surgeryl, chronic pain, meditation, podcasts and processing for management of stressors.  Plan: ndividual sessions biweekly.  Diagnosis: Axis I: F43.23, F43.20        [place], LCAS  04/23/17

## 2017-05-01 ENCOUNTER — Ambulatory Visit (HOSPITAL_COMMUNITY): Payer: Self-pay | Admitting: Licensed Clinical Social Worker

## 2017-05-15 ENCOUNTER — Encounter: Payer: Self-pay | Admitting: Family Medicine

## 2017-05-15 ENCOUNTER — Other Ambulatory Visit: Payer: Self-pay

## 2017-05-15 ENCOUNTER — Ambulatory Visit (INDEPENDENT_AMBULATORY_CARE_PROVIDER_SITE_OTHER): Payer: 59 | Admitting: Family Medicine

## 2017-05-15 VITALS — BP 132/74 | HR 82 | Temp 97.7°F | Resp 18 | Ht 68.0 in | Wt 256.0 lb

## 2017-05-15 DIAGNOSIS — I10 Essential (primary) hypertension: Secondary | ICD-10-CM

## 2017-05-15 DIAGNOSIS — E7439 Other disorders of intestinal carbohydrate absorption: Secondary | ICD-10-CM | POA: Diagnosis not present

## 2017-05-15 DIAGNOSIS — E782 Mixed hyperlipidemia: Secondary | ICD-10-CM

## 2017-05-15 DIAGNOSIS — Z6838 Body mass index (BMI) 38.0-38.9, adult: Secondary | ICD-10-CM

## 2017-05-15 MED ORDER — FUROSEMIDE 20 MG PO TABS: 20.0000 mg | ORAL_TABLET | Freq: Every day | ORAL | 6 refills | Status: DC

## 2017-05-15 MED ORDER — NEBIVOLOL HCL 10 MG PO TABS: 10.0000 mg | ORAL_TABLET | Freq: Every day | ORAL | 6 refills | Status: DC

## 2017-05-15 NOTE — Assessment & Plan Note (Signed)
Controlled no changes 

## 2017-05-15 NOTE — Patient Instructions (Addendum)
F/U 4 months for PHYSICAL  

## 2017-05-15 NOTE — Assessment & Plan Note (Signed)
Overall goal is to get around 200lbs

## 2017-05-15 NOTE — Assessment & Plan Note (Signed)
Check a1c, discussed cutting down carbs

## 2017-05-15 NOTE — Progress Notes (Signed)
   Subjective:    Patient ID: Melody Jenkins, female    DOB: [birth year], 51 y.o.   MRN: [id number]  Patient presents for Follow-up (is fasting) Pt here to f/u hypertension, medications reviewed She is currently on bystolic, lasix   Has had carpal tunnel surgery from Dr. Saintclair Halsted has f/u Jan, this surgery was difficulty, as she was walking with cane, and preparing her own meals. She needs it on left side, but does not have help or financially capable to do this surgery right now   Followed wity psychiatrist, her pritiq was lowered to  50mg  when BP was quite elevated  Borderline DM- last A1C  6%, has been eating a lot of frozen dinners, has gained 12lbs since Sept    Review Of Systems:  GEN- denies fatigue, fever, weight loss,weakness, recent illness HEENT- denies eye drainage, change in vision, nasal discharge, CVS- denies chest pain, palpitations RESP- denies SOB, cough, wheeze ABD- denies N/V, change in stools, abd pain GU- denies dysuria, hematuria, dribbling, incontinence MSK- denies joint pain, muscle aches, injury Neuro- denies headache, dizziness, syncope, seizure activity       Objective:    BP 132/74   Pulse 82   Temp 97.7 F (36.5 C) (Oral)   Resp 18   Ht 5\' 8"  (1.727 m)   Wt 256 lb (116.1 kg)   SpO2 98%   BMI 38.92 kg/m  GEN- NAD, alert and oriented x3,obese  HEENT- PERRL, EOMI, non injected sclera, pink conjunctiva, MMM, oropharynx clear Neck- Supple, no thyromegaly CVS- RRR, no murmur RESP-CTAB EXT- No edema Pulses- Radial +        Assessment & Plan:      Problem List Items Addressed This Visit      Unprioritized   Hyperlipidemia   Relevant Medications   furosemide (LASIX) 20 MG tablet   nebivolol (BYSTOLIC) 10 MG tablet   Other Relevant Orders   Lipid panel   Obesity    Overall goal is to get around 200lbs      Glucose intolerance    Check a1c, discussed cutting down carbs      Relevant Orders   Hemoglobin A1c   Essential  hypertension, benign - Primary    Controlled no changes      Relevant Medications   furosemide (LASIX) 20 MG tablet   nebivolol (BYSTOLIC) 10 MG tablet   Other Relevant Orders   CBC with Differential/Platelet   Comprehensive metabolic panel      Note: This dictation was prepared with Dragon dictation along with smaller phrase technology. Any transcriptional errors that result from this process are unintentional.

## 2017-05-16 ENCOUNTER — Ambulatory Visit (HOSPITAL_COMMUNITY): Payer: Self-pay | Admitting: Licensed Clinical Social Worker

## 2017-05-16 LAB — LIPID PANEL
CHOL/HDL RATIO: 6.1 (calc) — AB (ref <5.0)
Cholesterol: 200 mg/dL — ABNORMAL HIGH (ref <200)
HDL: 33 mg/dL — AB (ref >50)
LDL Cholesterol (Calc): 136 mg/dL (calc) — ABNORMAL HIGH
NON-HDL CHOLESTEROL (CALC): 167 mg/dL — AB (ref <130)
Triglycerides: 172 mg/dL — ABNORMAL HIGH (ref <150)

## 2017-05-16 LAB — COMPREHENSIVE METABOLIC PANEL
AG Ratio: 1.2 (calc) (ref 1.0–2.5)
ALBUMIN MSPROF: 3.6 g/dL (ref 3.6–5.1)
ALT: 17 U/L (ref 6–29)
AST: 21 U/L (ref 10–35)
Alkaline phosphatase (APISO): 66 U/L (ref 33–130)
BUN: 15 mg/dL (ref 7–25)
CHLORIDE: 103 mmol/L (ref 98–110)
CO2: 30 mmol/L (ref 20–32)
CREATININE: 0.87 mg/dL (ref 0.50–1.05)
Calcium: 9.3 mg/dL (ref 8.6–10.4)
GLOBULIN: 2.9 g/dL (ref 1.9–3.7)
GLUCOSE: 109 mg/dL — AB (ref 65–99)
Potassium: 4.8 mmol/L (ref 3.5–5.3)
SODIUM: 139 mmol/L (ref 135–146)
TOTAL PROTEIN: 6.5 g/dL (ref 6.1–8.1)
Total Bilirubin: 0.5 mg/dL (ref 0.2–1.2)

## 2017-05-16 LAB — CBC WITH DIFFERENTIAL/PLATELET
BASOS PCT: 0.7 %
Basophils Absolute: 52 cells/uL (ref 0–200)
EOS PCT: 2.2 %
Eosinophils Absolute: 163 cells/uL (ref 15–500)
HCT: 41.5 % (ref 35.0–45.0)
HEMOGLOBIN: 13.5 g/dL (ref 11.7–15.5)
Lymphs Abs: 3434 cells/uL (ref 850–3900)
MCH: 26.8 pg — AB (ref 27.0–33.0)
MCHC: 32.5 g/dL (ref 32.0–36.0)
MCV: 82.5 fL (ref 80.0–100.0)
MONOS PCT: 9.1 %
MPV: 9.9 fL (ref 7.5–12.5)
NEUTROS ABS: 3078 {cells}/uL (ref 1500–7800)
Neutrophils Relative %: 41.6 %
PLATELETS: 244 10*3/uL (ref 140–400)
RBC: 5.03 10*6/uL (ref 3.80–5.10)
RDW: 14.1 % (ref 11.0–15.0)
TOTAL LYMPHOCYTE: 46.4 %
WBC mixed population: 673 cells/uL (ref 200–950)
WBC: 7.4 10*3/uL (ref 3.8–10.8)

## 2017-05-16 LAB — HEMOGLOBIN A1C
EAG (MMOL/L): 7 (calc)
Hgb A1c MFr Bld: 6 % of total Hgb — ABNORMAL HIGH (ref <5.7)
Mean Plasma Glucose: 126 (calc)

## 2017-05-17 ENCOUNTER — Ambulatory Visit (INDEPENDENT_AMBULATORY_CARE_PROVIDER_SITE_OTHER): Payer: 59 | Admitting: Licensed Clinical Social Worker

## 2017-05-17 ENCOUNTER — Encounter (HOSPITAL_COMMUNITY): Payer: Self-pay | Admitting: Licensed Clinical Social Worker

## 2017-05-17 DIAGNOSIS — F331 Major depressive disorder, recurrent, moderate: Secondary | ICD-10-CM | POA: Diagnosis not present

## 2017-05-17 NOTE — Progress Notes (Signed)
   THERAPIST PROGRESS NOTE  Session Time: 9:10-10am  Participation Level: Active  Behavioral Response: CasualAlertDepressed  Type of Therapy: Individual Therapy  Treatment Goals addressed: Coping  Interventions: Supportive and Reframing  Summary: Melody Jenkins is a 51 y.o. female presenting for her individual counseling session. Pt discussed her psychiatric symptoms and current life events. Pt just had carpal tunnel surgery. She is struggling with her cane and walking steadily. This has caused her more stress and depression. She wonders why she had the surgery. Had pt write the pros and cons of having the surgery. Pt is reports her pain is less because of her carpal tunnel surgery but her pain in her back has intensified. Pt is not looking forward to the holidays. Processed with pt what new holiday traditions she can incorporate into her family. Pt visited her fiances' grave and it becomes easier each time she goes. Continue to process grief with pt. Encouraged pt to continue with her podcasts for pain and meditation for stressors.      Suicidal/Homicidal: Nowithout intent/plan  Therapist Response: Assessed pt's current functioning and reviewed progress. Assisted pt  Processing depressive symptoms, grief, holiday traditions, chronic pain, meditation, podcasts and processing for management of stressors.  Plan: individual sessions biweekly.  Diagnosis: Axis I: F43.23, F43.20        [place], LCAS  05/17/17

## 2017-05-24 ENCOUNTER — Encounter: Payer: Self-pay | Admitting: Family Medicine

## 2017-05-29 ENCOUNTER — Ambulatory Visit (HOSPITAL_COMMUNITY): Payer: Self-pay | Admitting: Licensed Clinical Social Worker

## 2017-06-07 DIAGNOSIS — M5416 Radiculopathy, lumbar region: Secondary | ICD-10-CM | POA: Insufficient documentation

## 2017-06-13 ENCOUNTER — Ambulatory Visit (INDEPENDENT_AMBULATORY_CARE_PROVIDER_SITE_OTHER): Payer: 59 | Admitting: Psychiatry

## 2017-06-13 ENCOUNTER — Encounter (HOSPITAL_COMMUNITY): Payer: Self-pay | Admitting: Psychiatry

## 2017-06-13 DIAGNOSIS — G4700 Insomnia, unspecified: Secondary | ICD-10-CM

## 2017-06-13 DIAGNOSIS — F17210 Nicotine dependence, cigarettes, uncomplicated: Secondary | ICD-10-CM | POA: Diagnosis not present

## 2017-06-13 DIAGNOSIS — F4310 Post-traumatic stress disorder, unspecified: Secondary | ICD-10-CM

## 2017-06-13 DIAGNOSIS — G4701 Insomnia due to medical condition: Secondary | ICD-10-CM | POA: Diagnosis not present

## 2017-06-13 DIAGNOSIS — F331 Major depressive disorder, recurrent, moderate: Secondary | ICD-10-CM | POA: Diagnosis not present

## 2017-06-13 DIAGNOSIS — F419 Anxiety disorder, unspecified: Secondary | ICD-10-CM

## 2017-06-13 DIAGNOSIS — R450 Nervousness: Secondary | ICD-10-CM

## 2017-06-13 MED ORDER — HYDROXYZINE PAMOATE 50 MG PO CAPS: 50.0000 mg | ORAL_CAPSULE | Freq: Three times a day (TID) | ORAL | 2 refills | Status: DC | PRN

## 2017-06-13 MED ORDER — MIRTAZAPINE 30 MG PO TABS: 30.0000 mg | ORAL_TABLET | Freq: Every day | ORAL | 2 refills | Status: DC

## 2017-06-13 NOTE — Progress Notes (Signed)
[place] MD/PA/NP OP Progress Note  06/13/2017 1:40 PM Melody Jenkins  MRN:  [id number]  Chief Complaint:  Chief Complaint    Follow-up; Depression     HPI: Pt states she has noticed improvement in BP since decreasing dose of Pristiq. She is not having nightmares of [place]. She is thinking about him (intrusive memories) but not as much. A lot of things trigger his memory. pt denies flashbacks. Christmas was really hard.  Since decreasing Pristiq she has noticed some worsening depression. Pt states "I am tired of hurting". She is easily overwhelmed and has a low frustration tolerance. Pt has poor concentration. Pt is making an effort to change into day clothes after waking up. She doesn't really go anywhere or do anything. Pt see's her friends on the weekends. Most of the week she is alone. She thought about volunteering but doesn't think she handle much physically. Pt reports Remeron is not helping with mood or sleep. Pt has trouble falling and staying asleep. She wakes up every 2-3 hrs. Pt is sleeping 3-4 hrs/night. She feels like she is a burden to her friends and family. Pt denies SI/HI.  Anxiety is not responding to Vistaril.   Pt had carpel tunnel surgery and it was a tough recovery. She is walking with a cane. Pt wants a home health aide as she can't cook or clean. She is unable to stand for long periods of time. Pt has severe pain in her hands, back and legs. It makes her irritable and no one understands. Pt went to pain management on Monday but wasn't able to tolerate the injections.  Pt states-taking meds as prescribed and denies SE.   Visit Diagnosis:    ICD-10-CM   1. Insomnia due to medical condition G47.01 mirtazapine (REMERON) 30 MG tablet  2. PTSD (post-traumatic stress disorder) F43.10 hydrOXYzine (VISTARIL) 50 MG capsule  3. MDD (major depressive disorder), recurrent episode, moderate (HCC) F33.1 mirtazapine (REMERON) 30 MG tablet      Past Psychiatric History:  Patient  endorse history of depression 20 years ago when she was given Lexapro by her primary care physician.  She remember a good response from the Lexapro.  Patient denies any history of psychiatric inpatient treatment, psychosis, suicidal attempt, mania, hallucination, OCD symptoms.    Previous Psychotropic Medications: Lexapro, Effexor, Vistaril-ineffective; Pristiq- helpful but increased BP   Substance Abuse History in the last 12 months:  No.   Consequences of Substance Abuse: Patient endorse history of drinking alcohol on the weekends and get some time intoxicated but denies any history of seizures, tremors, blackouts or any withdrawal symptoms.   Past Medical History:  Past Medical History:  Diagnosis Date  . Anemia   . Anxiety   . Asthma   . Carpal tunnel syndrome   . Depression   . History of multiple miscarriages   . Hyperlipidemia     Past Surgical History:  Procedure Laterality Date  . BREAST SURGERY  2000   reduction  . COSMETIC SURGERY  2000   breast reduction  . REDUCTION MAMMAPLASTY Bilateral     Family Psychiatric History:  Family History  Problem Relation Age of Onset  . Diabetes Mother   . Hypertension Mother   . Hypertension Father   . Heart disease Father   . Early death Brother   . Kidney disease Maternal Grandmother   . Heart disease Brother     Social History:  Social History   Socioeconomic History  . Marital status: Single  Spouse name: None  . Number of children: None  . Years of education: None  . Highest education level: None  Social Needs  . Financial resource strain: None  . Food insecurity - worry: None  . Food insecurity - inability: None  . Transportation needs - medical: None  . Transportation needs - non-medical: None  Occupational History  . None  Tobacco Use  . Smoking status: Current Every Day Smoker    Packs/day: 0.25    Years: 30.00    Pack years: 7.50    Types: Cigarettes  . Smokeless tobacco: Never Used  Substance  and Sexual Activity  . Alcohol use: No  . Drug use: No  . Sexual activity: Not Currently    Birth control/protection: None  Other Topics Concern  . None  Social History Narrative  . None    Allergies:  Allergies  Allergen Reactions  . Trazodone And Nefazodone     Metabolic Disorder Labs: Lab Results  Component Value Date   HGBA1C 6.0 (H) 05/15/2017   MPG 126 05/15/2017   MPG 126 02/13/2017   No results found for: PROLACTIN Lab Results  Component Value Date   CHOL 200 (H) 05/15/2017   TRIG 172 (H) 05/15/2017   HDL 33 (L) 05/15/2017   CHOLHDL 6.1 (H) 05/15/2017   VLDL 29 03/05/2016   LDLCALC 121 03/05/2016   LDLCALC 113 04/06/2015   Lab Results  Component Value Date   TSH 0.83 11/07/2016   TSH 0.59 03/05/2016    Therapeutic Level Labs: No results found for: LITHIUM No results found for: VALPROATE No components found for:  CBMZ  Current Medications: Current Outpatient Medications  Medication Sig Dispense Refill  . albuterol (PROVENTIL HFA;VENTOLIN HFA) 108 (90 BASE) MCG/ACT inhaler Inhale 2 puffs into the lungs every 6 (six) hours as needed for wheezing or shortness of breath.    . Biotin 5000 MCG CAPS Take 1 capsule by mouth.    . Cyanocobalamin (VITAMIN B 12 PO) Take 1 tablet by mouth daily.     . cyclobenzaprine (FLEXERIL) 10 MG tablet Take 1 tablet (10 mg total) by mouth 3 (three) times daily as needed for muscle spasms. 30 tablet 0  . desvenlafaxine (PRISTIQ) 50 MG 24 hr tablet Take 1 tablet (50 mg total) by mouth daily. 30 tablet 2  . furosemide (LASIX) 20 MG tablet Take 1 tablet (20 mg total) by mouth daily. 30 tablet 6  . gabapentin (NEURONTIN) 300 MG capsule Take 1 capsule (300 mg total) by mouth at bedtime. (Patient taking differently: Take 300 mg by mouth at bedtime. ) 90 capsule 3  . hydrOXYzine (VISTARIL) 50 MG capsule Take 1 capsule (50 mg total) by mouth 3 (three) times daily as needed. 90 capsule 2  . meloxicam (MOBIC) 15 MG tablet Take 15 mg by  mouth 2 (two) times daily.    . mirtazapine (REMERON) 15 MG tablet Take 1 tablet (15 mg total) by mouth at bedtime. 30 tablet 2  . nebivolol (BYSTOLIC) 10 MG tablet Take 1 tablet (10 mg total) by mouth daily. 30 tablet 6  . traMADol (ULTRAM) 50 MG tablet Take 50 mg by mouth 2 (two) times daily.    . vitamin C (ASCORBIC ACID) 500 MG tablet Take 500 mg by mouth daily.    . Vitamin D, Cholecalciferol, 1000 UNITS CAPS Take 2 capsules by mouth.      No current facility-administered medications for this visit.      Musculoskeletal: Strength & Muscle Tone:  within normal limits Gait & Station: unsteady, walking with cane Patient leans: N/A  Psychiatric Specialty Exam: Review of Systems  Musculoskeletal: Positive for back pain, joint pain and neck pain.  Psychiatric/Behavioral: Positive for depression. Negative for hallucinations, substance abuse and suicidal ideas. The patient is nervous/anxious and has insomnia.     Blood pressure 136/84, pulse 75, height 5' 7.75" (1.721 m), weight 253 lb 9.6 oz (115 kg).Body mass index is 38.84 kg/m.  General Appearance: Fairly Groomed  Eye Contact:  Good  Speech:  Clear and Coherent and Normal Rate  Volume:  Normal  Mood:  Anxious and Depressed  Affect:  Congruent  Thought Process:  Coherent and Descriptions of Associations: Circumstantial  Orientation:  Full (Time, Place, and Person)  Thought Content: Rumination   Suicidal Thoughts:  No  Homicidal Thoughts:  No  Memory:  Immediate;   Fair Recent;   Fair Remote;   Fair  Judgement:  Fair  Insight:  Fair  Psychomotor Activity:  Normal  Concentration:  Concentration: Fair and Attention Span: Fair  Recall:  [employer] of Knowledge: Fair  Language: Fair  Akathisia:  No  Handed:  Right  AIMS (if indicated): not done  Assets:  Communication Skills Desire for Improvement  ADL's:  Intact  Cognition: WNL  Sleep:  Poor   Screenings: GAD-7     Office Visit from 02/06/2016 in [facility]  Total GAD-7 Score  21    PHQ2-9     Office Visit from 05/15/2017 in [facility] Office Visit from 02/13/2017 in [facility] Office Visit from 11/20/2016 in [facility] Office Visit from 11/07/2016 in [facility] Office Visit from 12/26/2015 in [facility]  PHQ-2 Total Score  5  4  4  4  2   PHQ-9 Total Score  21  18  22  22  7        Assessment and Plan: MDD with anxiety-recurrent, severe without psychotic features- worsening; PTSD- stable; Insomnia- worsening    Medication management with supportive therapy. Risks/benefits and SE of the medication discussed. Pt verbalized understanding and verbal consent obtained for treatment.  Affirm with the patient that the medications are taken as ordered. Patient expressed understanding of how their medications were to be used.   Meds: d/c Pristiq  Vistaril 50mg  po TID prn anxiety as related to depression and PTSD increase Remeron 30mg  po qHS for depression, PTSD and sleep Pt is on Bystolic and Neurontin as prescribed by PCP which both have an off label indication for anxiety   Labs: none  Therapy: brief supportive therapy provided. Discussed psychosocial stressors in detail.   Recommended pt look for online support groups for chronic pain  Consultations: Encouraged to follow up with therapist Encouraged to follow up with PCP as needed  Pt denies SI and is at an acute low risk for suicide. Patient told to call clinic if any problems occur. Patient advised to go to ER if they should develop SI/HI, side effects, or if symptoms worsen. Has crisis numbers to call if needed. Pt verbalized understanding.  F/up in 2 months or sooner if needed  Charlcie Cradle, MD 06/13/2017, 1:40 PM

## 2017-06-19 ENCOUNTER — Encounter (HOSPITAL_COMMUNITY): Payer: Self-pay | Admitting: Licensed Clinical Social Worker

## 2017-06-19 ENCOUNTER — Ambulatory Visit (INDEPENDENT_AMBULATORY_CARE_PROVIDER_SITE_OTHER): Payer: 59 | Admitting: Licensed Clinical Social Worker

## 2017-06-19 DIAGNOSIS — F331 Major depressive disorder, recurrent, moderate: Secondary | ICD-10-CM | POA: Diagnosis not present

## 2017-06-19 NOTE — Progress Notes (Signed)
   THERAPIST PROGRESS NOTE  Session Time: 3:30-4:15pm  Participation Level: Active  Behavioral Response: CasualAlertDepressed  Type of Therapy: Individual Therapy  Treatment Goals addressed: Coping  Interventions: Supportive and Reframing  Summary: Melody Jenkins is a 52 y.o. female presenting 30 minutes late for her individual counseling session. Pt discussed her psychiatric symptoms and current life events. Pt just saw her psychiatrist Dr. Volanda Napoleon last week and some of her meds were changed. Pt is having a hard time financially,excruciating physical pain, loss of function in her right hand after carpel tunnel surgery, loneliness. Processed her decision to not have back surgery which has led to extreme back pain. Discussed pros and cons of surgery.  Assisted pt with budget review and financial responsibilities, asking for help from agencies. Discussed with pt the importance of following through with physical therapy to assist in healing after surgery. Gave pt information on self soothing through the senses.   Suicidal/Homicidal: Nowithout intent/plan  Therapist Response: Assessed pt's current functioning and reviewed progress. Assisted pt  Processing depressive symptoms, surgery, financial stress,, chronic pain, self soothing and processing for management of stressors.  Plan: individual sessions biweekly.  Diagnosis: Axis I: F43.23, F43.20        [place], LCAS  06/19/17

## 2017-06-25 ENCOUNTER — Telehealth (HOSPITAL_COMMUNITY): Payer: Self-pay

## 2017-06-25 NOTE — Telephone Encounter (Signed)
Patient states that her anxiety is even worse, she is still taking the Vistaril and her pain management doctor increased the Gabapentin and she is going to try that for awhile and see if it helps. She just wanted you to be aware of the change.

## 2017-06-28 ENCOUNTER — Encounter: Payer: Self-pay | Admitting: Family Medicine

## 2017-07-03 ENCOUNTER — Encounter (HOSPITAL_COMMUNITY): Payer: Self-pay | Admitting: Licensed Clinical Social Worker

## 2017-07-03 ENCOUNTER — Ambulatory Visit (INDEPENDENT_AMBULATORY_CARE_PROVIDER_SITE_OTHER): Payer: 59 | Admitting: Licensed Clinical Social Worker

## 2017-07-03 DIAGNOSIS — F331 Major depressive disorder, recurrent, moderate: Secondary | ICD-10-CM

## 2017-07-03 NOTE — Progress Notes (Signed)
   THERAPIST PROGRESS NOTE  Session Time: 10:15-11am  Participation Level: Active  Behavioral Response: CasualAlertDepressed  Type of Therapy: Individual Therapy  Treatment Goals addressed: Coping  Interventions: Supportive and Reframing  Summary: Melody Jenkins is a 52 y.o. female presenting 30 minutes late for her individual counseling session. Pt discussed her psychiatric symptoms and current life events. Pt was quite agitated today. She is tired of going to doctors, physical therapy, pain med doctors, taking medications and still being in pain. She talked less about her back pain today. Assisted pt in learning how to ask for and acceptiing help. She has several family members and girlfriends who continuously ask to help pt but she says,  "i'm fine." Processed with pt about acceptance of her current plight, which is not forever. Pt continued with her agitation and complaints of drs not really helping her. She is frustrated where she is in her life. Assisted pt with mindfulness skills.       Suicidal/Homicidal: Nowithout intent/plan  Therapist Response: Assessed pt's current functioning and reviewed progress. Assisted pt processing agitation, asking for help, mindfulness, frustration. Assisted pt  processing for management of stressors.  Plan: individual sessions biweekly.  Diagnosis: Axis I: F43.23, F43.20        MACKENZIE,LISBETH S, LCAS  07/03/17

## 2017-07-10 ENCOUNTER — Telehealth (HOSPITAL_COMMUNITY): Payer: Self-pay

## 2017-07-10 NOTE — Telephone Encounter (Signed)
Patient is calling to let you know that she spoke to her pain management doctor, Dr. Brien Few and he would prefer that you handle patients anxiety. He stated that he will give you notes if you need them, but patient is on no opiates right now.

## 2017-07-17 ENCOUNTER — Telehealth (HOSPITAL_COMMUNITY): Payer: Self-pay

## 2017-07-17 ENCOUNTER — Encounter (HOSPITAL_COMMUNITY): Payer: Self-pay | Admitting: Licensed Clinical Social Worker

## 2017-07-17 ENCOUNTER — Ambulatory Visit (INDEPENDENT_AMBULATORY_CARE_PROVIDER_SITE_OTHER): Payer: 59 | Admitting: Licensed Clinical Social Worker

## 2017-07-17 DIAGNOSIS — F331 Major depressive disorder, recurrent, moderate: Secondary | ICD-10-CM

## 2017-07-17 NOTE — Telephone Encounter (Signed)
Patient is here to see Eustaquio Maize and states that the anxiety medication is not working and she needs something to help her anxiety. Patient states she does not need it all the time, only when anxiety is really high. Patient sees Dr. Brien Few at [facility] and is currently on Gabapentin, Tramadol and Mobic. She states that Dr. Brien Few told her she needs Xanax. Please review and advise, thank you

## 2017-07-17 NOTE — Progress Notes (Signed)
   THERAPIST PROGRESS NOTE  Session Time: 10:15-11am  Participation Level: Active  Behavioral Response: CasualAlertDepressed  Type of Therapy: Individual Therapy  Treatment Goals addressed: Coping  Interventions: Supportive and Reframing  Summary: ZHOEY BLACKSTOCK is a 52 y.o. female presenting 30 minutes late for her individual counseling session. Pt discussed her psychiatric symptoms and current life events. Pt was much lesse agitated today.  She happily reported her daughter has moved home and is now her care giver. (part time). This has made pt's life much better and less stressful. She still continues with unbearable pain and continues with the pain management clinic. Pt is now hopeful in her life because of her daughter being there to help her. Asked open ended questions and used emotional reflection. Pt still struggles in keeping her house out of foreclosure, her constant dr appts, pt appts, pain management clinic appts and still tries to reamain positive. Discussed staying positive with pt.   Suicidal/Homicidal: Nowithout intent/plan  Therapist Response: Assessed pt's current functioning and reviewed progress. Assisted pt processing agitation, asking for help, accepting help and staying positive . Assisted pt  processing for management of stressors.  Plan: individual sessions biweekly.  Diagnosis: Axis I: F43.23, F43.20        [place], LCAS  07/17/17

## 2017-07-18 ENCOUNTER — Other Ambulatory Visit (HOSPITAL_COMMUNITY): Payer: Self-pay | Admitting: Psychiatry

## 2017-07-18 MED ORDER — CLONIDINE HCL 0.1 MG PO TABS: 0.1000 mg | ORAL_TABLET | Freq: Every day | ORAL | 0 refills | Status: DC | PRN

## 2017-07-18 NOTE — Telephone Encounter (Signed)
Pt states she has been having frequent panic attacks. She had a bad one in Dr. Lauris Poag office. She states he would rather psych administer anxiety meds. She is having severe anxiety related to her ongoing pain. She is overwhelmed and stressed out. Her frustration tolerance is low. Pristiq helped the most but she can't tolerate the increase in BP. She wants something to help with her anxiety.  P: start trial of Clonidine 0.1mg  po qD prn anxiety. Pt aware of SE and will monitor BP and practice fall precautions.

## 2017-07-24 ENCOUNTER — Encounter (HOSPITAL_COMMUNITY): Payer: Self-pay | Admitting: Licensed Clinical Social Worker

## 2017-07-24 ENCOUNTER — Ambulatory Visit (INDEPENDENT_AMBULATORY_CARE_PROVIDER_SITE_OTHER): Payer: 59 | Admitting: Licensed Clinical Social Worker

## 2017-07-24 DIAGNOSIS — F331 Major depressive disorder, recurrent, moderate: Secondary | ICD-10-CM | POA: Diagnosis not present

## 2017-07-24 NOTE — Progress Notes (Signed)
   THERAPIST PROGRESS NOTE  Session Time: 11:15-12noon  Participation Level: Active  Behavioral Response: CasualAlertDepressed  Type of Therapy: Individual Therapy  Treatment Goals addressed: Coping  Interventions: Supportive and Reframing  Summary: Melody Jenkins is a 52 y.o. female presents for her individual  counseling session. Pt discussed her psychiatric symptoms and current life events. Pt was much lesse agitated today., however, she was still in extreme pain. Pt is still not have much pain relief in her carpal tunnel symptoms, since surgery. Pt's mood has improved since her daughter has moved in and she is pt's caregiver. This has relieved a lot of stress off pt. Pt talked with psychiatrist Doyne Keel who gave her a new anxiety meds which pt reports is helping with her anxiety. Pt lacks insight into her anxiety and patterns of behavior. Used radical honesty and emotional reflection with pt. Pt's outlook on life has improved as she looks to her future, making goals for her next phase of life.    Suicidal/Homicidal: Nowithout intent/plan  Therapist Response: Assessed pt's current functioning and reviewed progress. Assisted pt processing pain management, medication change, radical honesty and emotional reflection in discussion of pt's anxiety. Assisted pt  processing for management of stressors.  Plan: individual sessions biweekly.  Diagnosis: Axis I: F43.23, F43.20        Kioni Stahl S, LCAS  07/24/17

## 2017-08-01 ENCOUNTER — Ambulatory Visit: Payer: 59 | Admitting: Physician Assistant

## 2017-08-01 ENCOUNTER — Emergency Department (HOSPITAL_COMMUNITY)
Admission: EM | Admit: 2017-08-01 | Discharge: 2017-08-01 | Disposition: A | Discharge status: Home / Self Care | Payer: 59 | Attending: Emergency Medicine | Admitting: Emergency Medicine

## 2017-08-01 ENCOUNTER — Encounter: Payer: Self-pay | Admitting: Family Medicine

## 2017-08-01 ENCOUNTER — Encounter (HOSPITAL_COMMUNITY): Payer: Self-pay | Admitting: Emergency Medicine

## 2017-08-01 DIAGNOSIS — M545 Low back pain: Secondary | ICD-10-CM | POA: Insufficient documentation

## 2017-08-01 DIAGNOSIS — K649 Unspecified hemorrhoids: Secondary | ICD-10-CM | POA: Diagnosis not present

## 2017-08-01 DIAGNOSIS — F17210 Nicotine dependence, cigarettes, uncomplicated: Secondary | ICD-10-CM | POA: Insufficient documentation

## 2017-08-01 DIAGNOSIS — G8929 Other chronic pain: Secondary | ICD-10-CM

## 2017-08-01 DIAGNOSIS — I10 Essential (primary) hypertension: Secondary | ICD-10-CM | POA: Insufficient documentation

## 2017-08-01 DIAGNOSIS — Z79899 Other long term (current) drug therapy: Secondary | ICD-10-CM | POA: Diagnosis not present

## 2017-08-01 DIAGNOSIS — K625 Hemorrhage of anus and rectum: Secondary | ICD-10-CM | POA: Diagnosis present

## 2017-08-01 LAB — CBC
HCT: 43.8 % (ref 36.0–46.0)
Hemoglobin: 14.1 g/dL (ref 12.0–15.0)
MCH: 27.2 pg (ref 26.0–34.0)
MCHC: 32.2 g/dL (ref 30.0–36.0)
MCV: 84.4 fL (ref 78.0–100.0)
Platelets: 219 K/uL (ref 150–400)
RBC: 5.19 MIL/uL — ABNORMAL HIGH (ref 3.87–5.11)
RDW: 14.6 % (ref 11.5–15.5)
WBC: 6.8 K/uL (ref 4.0–10.5)

## 2017-08-01 LAB — TYPE AND SCREEN
ABO/RH(D): O POS
Antibody Screen: NEGATIVE

## 2017-08-01 LAB — COMPREHENSIVE METABOLIC PANEL
ALBUMIN: 3.8 g/dL (ref 3.5–5.0)
ALT: 17 U/L (ref 14–54)
AST: 17 U/L (ref 15–41)
Alkaline Phosphatase: 74 U/L (ref 38–126)
Anion gap: 10 (ref 5–15)
BILIRUBIN TOTAL: 0.4 mg/dL (ref 0.3–1.2)
BUN: 14 mg/dL (ref 6–20)
CHLORIDE: 103 mmol/L (ref 101–111)
CO2: 26 mmol/L (ref 22–32)
CREATININE: 0.94 mg/dL (ref 0.44–1.00)
Calcium: 9.1 mg/dL (ref 8.9–10.3)
GFR calc Af Amer: >60 mL/min (ref >60)
GLUCOSE: 109 mg/dL — AB (ref 65–99)
POTASSIUM: 3.9 mmol/L (ref 3.5–5.1)
Sodium: 139 mmol/L (ref 135–145)
TOTAL PROTEIN: 7.2 g/dL (ref 6.5–8.1)

## 2017-08-01 LAB — POC OCCULT BLOOD, ED: Fecal Occult Bld: POSITIVE — AB

## 2017-08-01 MED ORDER — GABAPENTIN 300 MG PO CAPS
600.0000 mg | ORAL_CAPSULE | Freq: Once | ORAL | Status: AC
  Administered 2017-08-01: 600 mg via ORAL
  Filled 2017-08-01: qty 2

## 2017-08-01 MED ORDER — LIDOCAINE-HYDROCORTISONE ACE 3-0.5 % EX CREA: TOPICAL_CREAM | CUTANEOUS | 0 refills | Status: DC

## 2017-08-01 MED ORDER — TRAMADOL HCL 50 MG PO TABS
50.0000 mg | ORAL_TABLET | Freq: Once | ORAL | Status: AC
  Administered 2017-08-01: 50 mg via ORAL
  Filled 2017-08-01: qty 1

## 2017-08-01 NOTE — ED Triage Notes (Signed)
Patient states she is having worsening lower back pain even with home medications on board. Denies any urinary symptoms. Patient adds rectal bleeding onset of last night. C/o a pressure feeling in rectum and had constipation last week but did have BM yesterday and today. States constipation is normal due to daily pain medication.

## 2017-08-01 NOTE — ED Notes (Signed)
PA said she will dc orthostatic vital signs

## 2017-08-01 NOTE — ED Notes (Signed)
Pt states that she is going to "rate [place]" because our wait is unacceptable. She requested this RNs name and states "I'll remember you." Pt assured that we are working as fast as we can. She starts to point out individual patients that went back before her and she doesn't understand why. I state that we take people back based on acuity and we are not purposefully skipping her.

## 2017-08-01 NOTE — ED Notes (Signed)
Pt yelled across the lobby at me to come speak to her.

## 2017-08-01 NOTE — Discharge Instructions (Signed)
You were seen in the emergency department and found to have hemorrhoids and back pain.  The hemorrhoid bleeding seems to be improving.  Your hemoglobin was normal, meaning that you are not anemic from the bleeding.  You other lab work was also fairly normal.  You were given doses of your pain medications while in the emergency department.  Continue to take these as prescribed.  Follow-up with your pain management doctor as well as your neurosurgeon in regards to your back pain.  We have given you a prescription for a topical gel which is a numbing medicine and a steroid to help treat the symptoms related to your hemorrhoids.  Please see attached handout regarding hemorrhoids for further information on treatment including diet and warm sitz baths. Follow-up with your primary care provider tomorrow or Monday for re-evaluation and/or further management. Return to the emergency department for new or worsening symptoms including, but not limited to worsened bleeding, lightheadedness/dizziness, passing out, chest pain, difficulty bleeding, numbness, weakness, loss of control of your bowel or bladder function, Or any other concerns you may have

## 2017-08-01 NOTE — ED Notes (Signed)
Pt called for room, no response from lobby 

## 2017-08-01 NOTE — ED Provider Notes (Signed)
[facility] DEPT Provider Note   CSN: [id number] Arrival date & time: 08/01/17  1115     History   Chief Complaint Chief Complaint  Patient presents with  . Back Pain  . Rectal Bleeding    HPI Melody Jenkins is a 52 y.o. female with a hx of tobacco abuse, anxiety, asthma, hyperlipidemia, degenerative disc disease, and constipation who presents to the ED with complaint of rectal pain and bleeding since last night and chronic lower back pain. Patient states that she has previously felt a lump area near her rectum that has been sore in the past. Last night states it became much more sore with bleeding in toilet bowl and on her underwear. States she has intermittent problems with constipation- typically has 1 BM per day in the mornings that are somewhat hard.  No complaints of lightheadedness, dizziness, chest pain, dyspnea.  Denies abdominal pain. Additional complaint includes chronic lower back pain, states that she feels like it is gotten worse because she has not had her chronic pain medication today, sees neurosurgery and pain management for a bulging disc.  States that she typically takes 50 mg tramadol and 600 mg of gabapentin in the afternoons. No recent injuries or change in quality of pain. Denies numbness, tingling, weakness, incontinence to bowel/bladder, fever, chills, IV drug use, or hx of cancer.    HPI  Past Medical History:  Diagnosis Date  . Anemia   . Anxiety   . Asthma   . Carpal tunnel syndrome   . Depression   . History of multiple miscarriages   . Hyperlipidemia     Patient Active Problem List   Diagnosis Date Noted  . Glucose intolerance 02/13/2017  . Leg swelling 02/13/2017  . Constipation 11/20/2016  . Hot flashes 11/20/2016  . Tear of MCL (medial collateral ligament) of knee, left, subsequent encounter 04/06/2016  . Anterolisthesis 04/06/2016  . Situational mixed anxiety and depressive disorder 02/26/2016  . Insomnia  02/24/2016  . Back pain with radiation 02/14/2016  . Grief reaction 02/14/2016  . Essential hypertension, benign 12/26/2015  . DDD (degenerative disc disease), lumbar 12/26/2015  . Seasonal allergies 12/26/2015  . Tobacco user 04/06/2015  . Obesity 04/06/2015  . Anemia   . Asthma   . Hyperlipidemia     Past Surgical History:  Procedure Laterality Date  . BREAST SURGERY  2000   reduction  . COSMETIC SURGERY  2000   breast reduction  . REDUCTION MAMMAPLASTY Bilateral     OB History    No data available       Home Medications    Prior to Admission medications   Medication Sig Start Date End Date Taking? Authorizing Provider  albuterol (PROVENTIL HFA;VENTOLIN HFA) 108 (90 BASE) MCG/ACT inhaler Inhale 2 puffs into the lungs every 6 (six) hours as needed for wheezing or shortness of breath.    [provider]  Biotin 5000 MCG CAPS Take 1 capsule by mouth.    [provider]  cloNIDine (CATAPRES) 0.1 MG tablet Take 1 tablet (0.1 mg total) by mouth daily as needed. 07/18/17 07/18/18  Charlcie Cradle, MD  Cyanocobalamin (VITAMIN B 12 PO) Take 1 tablet by mouth daily.     [provider]  cyclobenzaprine (FLEXERIL) 10 MG tablet Take 1 tablet (10 mg total) by mouth 3 (three) times daily as needed for muscle spasms. 05/18/16   Alycia Rossetti, MD  furosemide (LASIX) 20 MG tablet Take 1 tablet (20 mg total) by  mouth daily. 05/15/17   Alycia Rossetti, MD  gabapentin (NEURONTIN) 300 MG capsule Take 1 capsule (300 mg total) by mouth at bedtime. Patient taking differently: Take 300 mg by mouth at bedtime.  11/20/16   [place], Modena Nunnery, MD  hydrOXYzine (VISTARIL) 50 MG capsule Take 1 capsule (50 mg total) by mouth 3 (three) times daily as needed. 06/13/17   Charlcie Cradle, MD  meloxicam (MOBIC) 15 MG tablet Take 15 mg by mouth 2 (two) times daily.    [provider]  mirtazapine (REMERON) 30 MG tablet Take 1 tablet (30 mg total) by mouth at bedtime. 06/13/17  06/13/18  Charlcie Cradle, MD  nebivolol (BYSTOLIC) 10 MG tablet Take 1 tablet (10 mg total) by mouth daily. 05/15/17   Alycia Rossetti, MD  traMADol (ULTRAM) 50 MG tablet Take 50 mg by mouth 2 (two) times daily.    [provider]  vitamin C (ASCORBIC ACID) 500 MG tablet Take 500 mg by mouth daily.    [provider]  Vitamin D, Cholecalciferol, 1000 UNITS CAPS Take 2 capsules by mouth.     [provider]    Family History Family History  Problem Relation Age of Onset  . Diabetes Mother   . Hypertension Mother   . Hypertension Father   . Heart disease Father   . Early death Brother   . Kidney disease Maternal Grandmother   . Heart disease Brother     Social History Social History   Tobacco Use  . Smoking status: Current Every Day Smoker    Packs/day: 0.25    Years: 30.00    Pack years: 7.50    Types: Cigarettes  . Smokeless tobacco: Never Used  Substance Use Topics  . Alcohol use: No  . Drug use: No     Allergies   Trazodone and nefazodone   Review of Systems Review of Systems  Constitutional: Negative for chills and fever.  Respiratory: Negative for shortness of breath.   Cardiovascular: Negative for chest pain.  Gastrointestinal: Positive for blood in stool, constipation and rectal pain. Negative for abdominal pain, diarrhea, nausea and vomiting.  Musculoskeletal: Positive for back pain.  Neurological: Negative for dizziness, syncope, weakness, light-headedness and numbness.       Negative for incontinence to bowel or bladder. Negative for saddle anesthesia.   All other systems reviewed and are negative.   Physical Exam Updated Vital Signs BP (!) 154/108   Pulse 88   Temp 98.4 F (36.9 C) (Oral)   Resp 15   Ht 5' 7.5" (1.715 m)   Wt 113.4 kg (250 lb)   LMP 05/19/2016 (Approximate)   SpO2 94%   BMI 38.58 kg/m   Physical Exam  Constitutional: She appears well-developed and well-nourished.  Non-toxic appearance. No distress.   HENT:  Head: Normocephalic and atraumatic.  Eyes: Conjunctivae are normal. Right eye exhibits no discharge. Left eye exhibits no discharge.  Cardiovascular: Normal rate and regular rhythm.  No murmur heard. Pulmonary/Chest: Breath sounds normal. No respiratory distress. She has no wheezes. She has no rales.  Abdominal: Soft. She exhibits no distension. There is no tenderness.  Genitourinary: Rectal exam shows external hemorrhoid (there is a 1cm sized hemorrhoid with very mild central bleeding with palpation. There is a 0.5cm size hemorrhoid with no active bleeding. Both areas are nontender to palpation. ) and guaiac positive stool. Rectal exam shows anal tone normal.  Musculoskeletal:  Back: diffuse lumbar tenderness including diffuse lumbar extending to bilateral paraspinal muscles,  no point/focal tenderness.   Neurological: She is alert.  Clear speech. Sensation grossly intact to bilateral lower extremities. 5/5 strength with plantar/dorsiflexion bilaterally. Patellar DTRs are 2+ and symmetric. Gait intact- patient walks with a cane at baseline.   Skin: Skin is warm and dry. No rash noted.  Psychiatric: She has a normal mood and affect. Her behavior is normal.  Nursing note and vitals reviewed.   ED Treatments / Results  Labs Results for orders placed or performed during the hospital encounter of 08/01/17  Comprehensive metabolic panel  Result Value Ref Range   Sodium 139 135 - 145 mmol/L   Potassium 3.9 3.5 - 5.1 mmol/L   Chloride 103 101 - 111 mmol/L   CO2 26 22 - 32 mmol/L   Glucose, Bld 109 (H) 65 - 99 mg/dL   BUN 14 6 - 20 mg/dL   Creatinine, Ser 0.94 0.44 - 1.00 mg/dL   Calcium 9.1 8.9 - 10.3 mg/dL   Total Protein 7.2 6.5 - 8.1 g/dL   Albumin 3.8 3.5 - 5.0 g/dL   AST 17 15 - 41 U/L   ALT 17 14 - 54 U/L   Alkaline Phosphatase 74 38 - 126 U/L   Total Bilirubin 0.4 0.3 - 1.2 mg/dL   GFR calc non Af Amer >60 >60 mL/min   GFR calc Af Amer >60 >60 mL/min   Anion gap 10 5 -  15  CBC  Result Value Ref Range   WBC 6.8 4.0 - 10.5 K/uL   RBC 5.19 (H) 3.87 - 5.11 MIL/uL   Hemoglobin 14.1 12.0 - 15.0 g/dL   HCT 43.8 36.0 - 46.0 %   MCV 84.4 78.0 - 100.0 fL   MCH 27.2 26.0 - 34.0 pg   MCHC 32.2 30.0 - 36.0 g/dL   RDW 14.6 11.5 - 15.5 %   Platelets 219 150 - 400 K/uL  POC occult blood, ED  Result Value Ref Range   Fecal Occult Bld POSITIVE (A) NEGATIVE  Type and screen [place]  Result Value Ref Range   ABO/RH(D) O POS    Antibody Screen NEG    Sample Expiration      08/04/2017 Performed at [facility], [place] [address], [place], [place] [address]   ABO/Rh  Result Value Ref Range   ABO/RH(D)      O POS Performed at [facility] [address], [place], [place] [address]    No results found. EKG  EKG Interpretation None      Radiology No results found.  Procedures Procedures (including critical care time)  Medications Ordered in ED Medications  traMADol (ULTRAM) tablet 50 mg (50 mg Oral Given 08/01/17 1820)  gabapentin (NEURONTIN) capsule 600 mg (600 mg Oral Given 08/01/17 1820)   Initial Impression / Assessment and Plan / ED Course  I have reviewed the triage vital signs and the nursing notes.  Pertinent labs & imaging results that were available during my care of the patient were reviewed by me and considered in my medical decision making (see chart for details).   Presents with complaints of rectal pain/bleeding as well as chronic back pain.  Patient is nontoxic-appearing, in no apparent distress, vitals are within normal limits other than elevated blood pressure which improved during her visit.    - Rectal pain/bleeding-on physical exam patient has 2 hemorrhoids present which are nontender to palpation.  One hemorrhoid is approximately 1 cm in size with an area of minimal  active bleeding with palpation.  The hemorrhoid has the appearance of a previously thrombosed hemorrhoid which  opened spontaneously resulting in the rectal bleeding.  Fecal occult blood positive, suspect related to the hemorrhoids, suspicious for internal hemorrhoids as well. No signs or symptoms of anemia.  Patient's hemoglobin is within normal limits at 14.1. Will treat with topical hydrocortisone/lidocaine, recommended sitz baths, fluids, and high fiber diet. Will have patient follow up with PCP.   - Back pain- consistent with chronic pain. Patient has no neurological deficits and normal neuro exam. There is no point vertebral tenderness.  Patient can walk but states is painful.  No loss of bowel or bladder control. No saddle anesthesia.  No concern for cauda equina.  No fever, night sweats, weight loss, h/o cancer, or IVDU. Will have patient continue chronic pain medication regimen with neurosurgery and pain management follow up.   I discussed results, treatment plan, need for follow-up as above, and return precautions with the patient. Provided opportunity for questions, patient confirmed understanding and is in agreement with plan.   Final Clinical Impressions(s) / ED Diagnoses   Final diagnoses:  Hemorrhoids, unspecified hemorrhoid type  Chronic bilateral low back pain, with sciatica presence unspecified    ED Discharge Orders        Ordered    Lidocaine-Hydrocortisone Ace 3-0.5 % CREA     08/01/17 [address] Glynda Jaeger, PA-C 08/01/17 2254    Fatima Blank, MD 08/01/17 2354

## 2017-08-02 LAB — ABO/RH: ABO/RH(D): O POS

## 2017-08-06 ENCOUNTER — Encounter: Payer: Self-pay | Admitting: Family Medicine

## 2017-08-06 ENCOUNTER — Ambulatory Visit (INDEPENDENT_AMBULATORY_CARE_PROVIDER_SITE_OTHER): Payer: 59 | Admitting: Family Medicine

## 2017-08-06 ENCOUNTER — Other Ambulatory Visit: Payer: Self-pay

## 2017-08-06 VITALS — BP 130/68 | HR 80 | Temp 98.3°F | Resp 16 | Ht 68.0 in | Wt 256.0 lb

## 2017-08-06 DIAGNOSIS — L739 Follicular disorder, unspecified: Secondary | ICD-10-CM | POA: Diagnosis not present

## 2017-08-06 DIAGNOSIS — K644 Residual hemorrhoidal skin tags: Secondary | ICD-10-CM

## 2017-08-06 DIAGNOSIS — Z1211 Encounter for screening for malignant neoplasm of colon: Secondary | ICD-10-CM | POA: Diagnosis not present

## 2017-08-06 DIAGNOSIS — K5903 Drug induced constipation: Secondary | ICD-10-CM | POA: Diagnosis not present

## 2017-08-06 MED ORDER — CLINDAMYCIN PHOS-BENZOYL PEROX 1-5 % EX GEL: Freq: Two times a day (BID) | CUTANEOUS | 0 refills | Status: DC

## 2017-08-06 MED ORDER — LINACLOTIDE 145 MCG PO CAPS: 145.0000 ug | ORAL_CAPSULE | Freq: Every day | ORAL | 1 refills | Status: DC

## 2017-08-06 NOTE — Progress Notes (Signed)
   Subjective:    Patient ID: Melody Jenkins, female    DOB: [birth year], 52 y.o.   MRN: [id number]  Patient presents for ER F/U (hemorrhoids) and Rash (irritation to chin)  Here for ER follow-up.  Was seen in the ER after she had rectal bleeding and pain.  Diagnosed with hemorrhoids.  She has been having some constipation. Then she had bright red blood and large amount  She is on chronic pain medication for her chronic back pain knee pain.  She takes tramadol and gabapentin.  In the ER no other acute findings were found.  She was fecal occult positive thought to be secondary to the hemorrhoids.  Also appear like she had a thrombosed hemorrhoid that had evacuated itself.  She was given hydrocortisone lidocaine ointment to apply as well as sitz baths.  Her hemoglobin was stable He has not had a colonoscopy though she has been referred twice for this.  Still has small amount of bleeding, BM are about every 2-3 times a week. She did not notice any blood this morning after BM  Saw her pain physician yesterday, no change in her pain medications    Recently put on clonidine for her anxiety by her psychiatrist, this is prn in addition to her other medications    Few months ago, touched her face and noticed a rash broke out that noight, she used AD Ointment and hydrocortisone cream Now has a burning sensations/soreness to touch  Review Of Systems:  GEN- denies fatigue, fever, weight loss,weakness, recent illness HEENT- denies eye drainage, change in vision, nasal discharge, CVS- denies chest pain, palpitations RESP- denies SOB, cough, wheeze ABD- denies N/V, change in stools, abd pain GU- denies dysuria, hematuria, dribbling, incontinence MSK- denies joint pain, muscle aches, injury Neuro- denies headache, dizziness, syncope, seizure activity       Objective:    BP 130/68   Pulse 80   Temp 98.3 F (36.8 C) (Oral)   Resp 16   Ht 5\' 8"  (1.727 m)   Wt 256 lb (116.1 kg)   LMP 05/19/2016  (Approximate)   SpO2 98%   BMI 38.92 kg/m  GEN- NAD, alert and oriented x3 HEENT- PERRL, EOMI, non injected sclera, pink conjunctiva, MMM, oropharynx clear Neck- no LAD Skin- hyperpigmented scarring, few pusutesl on chin, NT, scabs on chin  CVS- RRR, no murmur RESP-CTAB ABD-NABS,soft,NT,ND EXT- No edema Pulses- Radial 2+        Assessment & Plan:      Problem List Items Addressed This Visit      Unprioritized   Constipation    Restart Linzess, coupon given Continue hemorrhoid with the topical steroid      Relevant Orders   Ambulatory referral to Gastroenterology    Other Visit Diagnoses    External hemorrhoids    -  Primary   Relevant Orders   Ambulatory referral to Gastroenterology   Colon cancer screening       Referral to GI overdue for colonoscopy   Relevant Orders   Ambulatory referral to Gastroenterology   Folliculitis       This appears to be more like a folliculitis on her chin.  We will treat with topical antibiotic      Note: This dictation was prepared with Dragon dictation along with smaller phrase technology. Any transcriptional errors that result from this process are unintentional.

## 2017-08-06 NOTE — Patient Instructions (Addendum)
Referral to GI for colonoscopy  Linzess for constipation  F/U as previous

## 2017-08-07 ENCOUNTER — Encounter: Payer: Self-pay | Admitting: Gastroenterology

## 2017-08-07 ENCOUNTER — Telehealth (HOSPITAL_COMMUNITY): Payer: Self-pay | Admitting: Psychiatry

## 2017-08-07 ENCOUNTER — Encounter: Payer: Self-pay | Admitting: Family Medicine

## 2017-08-07 NOTE — Telephone Encounter (Signed)
Call pharmacy give the generic of the benzaclin. If for some reason it is not covered Then change to metrogel to face.

## 2017-08-07 NOTE — Assessment & Plan Note (Signed)
Restart Linzess, coupon given Continue hemorrhoid with the topical steroid

## 2017-08-08 ENCOUNTER — Encounter (HOSPITAL_COMMUNITY): Payer: Self-pay | Admitting: Licensed Clinical Social Worker

## 2017-08-08 ENCOUNTER — Telehealth: Payer: Self-pay | Admitting: *Deleted

## 2017-08-08 ENCOUNTER — Ambulatory Visit (INDEPENDENT_AMBULATORY_CARE_PROVIDER_SITE_OTHER): Payer: 59 | Admitting: Licensed Clinical Social Worker

## 2017-08-08 DIAGNOSIS — F331 Major depressive disorder, recurrent, moderate: Secondary | ICD-10-CM

## 2017-08-08 MED ORDER — CLINDAMYCIN PHOS-BENZOYL PEROX 1.2-5 % EX GEL: 1.0000 "application " | Freq: Two times a day (BID) | CUTANEOUS | 0 refills | Status: DC

## 2017-08-08 MED ORDER — METRONIDAZOLE 1 % EX GEL: Freq: Every day | CUTANEOUS | 0 refills | Status: DC

## 2017-08-08 NOTE — Telephone Encounter (Signed)
[facility] is reviewing your PA request. Typically an electronic response will be received within 72 hours.

## 2017-08-08 NOTE — Progress Notes (Signed)
   THERAPIST PROGRESS NOTE  Session Time: 11:15-12noon  Participation Level: Active  Behavioral Response: CasualAlertDepressed  Type of Therapy: Individual Therapy  Treatment Goals addressed: Coping  Interventions: Supportive and Reframing  Summary: Melody Jenkins is a 52 y.o. female presents for her individual  counseling session. Pt discussed her psychiatric symptoms and current life events. Pt reports her mood is stable more now that her daughter is living with her. When she has anxious moments she now has medication to take prescribed by Dr. Doyne Keel, which she reports is beneficial. Pt presented in less pain today but reports she had just taken her pain meds. Pt ended up in the ED last week for bleeding hemorrhoids. She now has to have a colonoscopy. Pt continues with her pain management clinic. Pt's brother who is addicted to crack cocaine is trying to come back in her life. Asked open ended questions and used empathic reflection.  Showed pt relationship circles and who she chooses to have in her life. Also discussed boundaries with pt and the difficulty boundaries with family. Suggested pt continue with her personal self care.  Suicidal/Homicidal: Nowithout intent/plan  Therapist Response: Assessed pt's current functioning and reviewed progress. Assisted pt processing pain management,  emotional reflection in discussion of pt's brother, along with relationship circles and boundaries with him.. Assisted pt  processing for management of stressors.  Plan: individual sessions biweekly.  Diagnosis: Axis I: F43.23, F43.20        [place], LCAS  08/08/17

## 2017-08-08 NOTE — Addendum Note (Signed)
Addended by: Sheral Flow on: 08/08/2017 04:48 PM   Modules accepted: Orders

## 2017-08-08 NOTE — Telephone Encounter (Signed)
Received request from pharmacy for [place] [facility] [address].  PA submitted.   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

## 2017-08-09 ENCOUNTER — Encounter: Payer: Self-pay | Admitting: *Deleted

## 2017-08-09 NOTE — Telephone Encounter (Signed)
Received PA determination.   PA denied.   Linzess is only covered when the patient has Dx of : chronic idiopathic constipation Irritable bowel syndrome with constipation  Appeal faxed.

## 2017-08-12 ENCOUNTER — Encounter: Payer: Self-pay | Admitting: Family Medicine

## 2017-08-12 ENCOUNTER — Telehealth: Payer: Self-pay | Admitting: Family Medicine

## 2017-08-12 MED ORDER — MUPIROCIN CALCIUM 2 % EX CREA: 1.0000 "application " | TOPICAL_CREAM | Freq: Two times a day (BID) | CUTANEOUS | 1 refills | Status: DC

## 2017-08-12 NOTE — Telephone Encounter (Signed)
Nurse called pharmacy, she has high deductible so the preferred creams Duac is covered but due to deductible very pricey. Will give bactroban as this is cheapest out of pocket

## 2017-08-12 NOTE — Telephone Encounter (Signed)
Call placed to patient to make aware.   Reports that she is aware that she has high deductible, but was informed by [facility] that there are other creams she can try that would cost less.   Attempted to advise patient of MD plan, but patient was upset and would not stop talking. Continued to speak over Probation officer. Was placed on hold to transfer to MD when patient hung up.   MD aware.

## 2017-08-13 ENCOUNTER — Ambulatory Visit (INDEPENDENT_AMBULATORY_CARE_PROVIDER_SITE_OTHER): Payer: 59 | Admitting: Licensed Clinical Social Worker

## 2017-08-13 ENCOUNTER — Encounter (HOSPITAL_COMMUNITY): Payer: Self-pay | Admitting: Licensed Clinical Social Worker

## 2017-08-13 DIAGNOSIS — F331 Major depressive disorder, recurrent, moderate: Secondary | ICD-10-CM

## 2017-08-13 MED ORDER — MUPIROCIN 2 % EX OINT: 1.0000 "application " | TOPICAL_OINTMENT | Freq: Two times a day (BID) | CUTANEOUS | 0 refills | Status: DC

## 2017-08-13 NOTE — Telephone Encounter (Signed)
Received fax from pharmacy.   Requested to change Bactroban to ointment to be covered by insurance.   Prescription sent to pharmacy.

## 2017-08-13 NOTE — Progress Notes (Signed)
   THERAPIST PROGRESS NOTE  Session Time: 11:15-12noon  Participation Level: Active  Behavioral Response: CasualAlertDepressed  Type of Therapy: Individual Therapy  Treatment Goals addressed: Coping  Interventions: CBT  Summary: Melody Jenkins is a 52 y.o. female presents for her individual  counseling session. Pt discussed her psychiatric symptoms and current life events. Pt reports on her many visits to the dr and pharmacy where she is becoming more angry and explosive. Pt has low frustration tolerance. Discussed DBT distress tolerance with pt to help facilitate appropriate responses to stimuli. Discussed with pt her triggers when they occur and appropriate responses.  Assisted pt increasing insight, coping skills, and her distress tolerance.   Suicidal/Homicidal: Nowithout intent/plan  Therapist Response: Assessed pt's current functioning and reviewed progress. Assisted pt processing low frustration tolerance and new coping skills to increase distress tolerance.Assisted pt  processing for management of stressors.  Plan: individual sessions biweekly.  Diagnosis: Axis I: F43.23, F43.20        [place], LCAS  08/13/17

## 2017-08-13 NOTE — Addendum Note (Signed)
Addended by: Sheral Flow on: 08/13/2017 08:34 AM   Modules accepted: Orders

## 2017-08-14 ENCOUNTER — Other Ambulatory Visit (HOSPITAL_COMMUNITY): Payer: Self-pay | Admitting: Psychiatry

## 2017-08-14 ENCOUNTER — Encounter: Payer: Self-pay | Admitting: Family Medicine

## 2017-08-15 ENCOUNTER — Encounter: Payer: Self-pay | Admitting: Family Medicine

## 2017-08-15 ENCOUNTER — Ambulatory Visit (INDEPENDENT_AMBULATORY_CARE_PROVIDER_SITE_OTHER): Payer: 59 | Admitting: Psychiatry

## 2017-08-15 VITALS — BP 138/82 | HR 85 | Ht 67.5 in | Wt 253.2 lb

## 2017-08-15 DIAGNOSIS — F333 Major depressive disorder, recurrent, severe with psychotic symptoms: Secondary | ICD-10-CM | POA: Diagnosis not present

## 2017-08-15 DIAGNOSIS — M549 Dorsalgia, unspecified: Secondary | ICD-10-CM | POA: Diagnosis not present

## 2017-08-15 DIAGNOSIS — G4701 Insomnia due to medical condition: Secondary | ICD-10-CM | POA: Diagnosis not present

## 2017-08-15 DIAGNOSIS — F4310 Post-traumatic stress disorder, unspecified: Secondary | ICD-10-CM | POA: Diagnosis not present

## 2017-08-15 DIAGNOSIS — F17210 Nicotine dependence, cigarettes, uncomplicated: Secondary | ICD-10-CM | POA: Diagnosis not present

## 2017-08-15 DIAGNOSIS — M791 Myalgia, unspecified site: Secondary | ICD-10-CM | POA: Diagnosis not present

## 2017-08-15 DIAGNOSIS — F411 Generalized anxiety disorder: Secondary | ICD-10-CM | POA: Diagnosis not present

## 2017-08-15 MED ORDER — CLONIDINE HCL 0.1 MG PO TABS: 0.1000 mg | ORAL_TABLET | Freq: Every day | ORAL | 1 refills | Status: DC | PRN

## 2017-08-15 MED ORDER — HYDROXYZINE PAMOATE 50 MG PO CAPS: 50.0000 mg | ORAL_CAPSULE | Freq: Three times a day (TID) | ORAL | 2 refills | Status: DC | PRN

## 2017-08-15 MED ORDER — MIRTAZAPINE 45 MG PO TABS: 45.0000 mg | ORAL_TABLET | Freq: Every day | ORAL | 2 refills | Status: DC

## 2017-08-15 NOTE — Progress Notes (Signed)
[place] MD/PA/NP OP Progress Note  08/15/2017 11:20 AM Melody Jenkins  MRN:  [id number]  Chief Complaint:  Chief Complaint    Depression; Insomnia; Follow-up     HPI: "Hanging in there. I had a massive breakdown this morning". Pt's spent last night at her friend's house last night because her grandmother died. She got home this morning and her dogs were gone. Pt searched for them and called around but couldn't find them. It was overwhelming and pt was crying because "I can't lose anything else". They reappeared at 9:30am. Pt took a Clonidine and it helped.  Pt has very lose stress tolerance and any stress causes a panic attack. Pt is always in pain and it only helps a little. Pt's daughter has moved back in with patient. Daughter works from [employer].  Daughter does all of the house chores which is a huge help. Daughter cooks all meals and spends the weekends with her.  It is helping to have someone there.  Pt is concerned that today's funeral will trigger PTSD symptoms. She is able to talk about her son's death some without having a breakdown.  Depression is much better now that her daughter is back. Pt was feeling very lonely but it is better. Pt was having a lot of depression in the evening and weekends. Pt feels her family cares about her. It takes a long time to fall asleep but then she sleeps good. Pt denies SI/HI.  Pt states-taking meds as prescribed and denies SE.   Visit Diagnosis:    ICD-10-CM   1. MDD (major depressive disorder), recurrent, severe, with psychosis ([place]) F33.3 mirtazapine (REMERON) 45 MG tablet  2. PTSD (post-traumatic stress disorder) F43.10 hydrOXYzine (VISTARIL) 50 MG capsule  3. Insomnia due to medical condition G47.01 mirtazapine (REMERON) 45 MG tablet  4. Anxiety state F41.1 cloNIDine (CATAPRES) 0.1 MG tablet      Past Psychiatric History:  Patient endorse history of depression 20 years ago when she was given Lexapro by her primary care physician.  She remember a  good response from the Lexapro.  Patient denies any history of psychiatric inpatient treatment, psychosis, suicidal attempt, mania, hallucination, OCD symptoms.    Previous Psychotropic Medications: Lexapro, Effexor, Vistaril-ineffective; Pristiq- helpful but increased BP   Substance Abuse History in the last 12 months:  No.   Consequences of Substance Abuse: Patient endorse history of drinking alcohol on the weekends and get some time intoxicated but denies any history of seizures, tremors, blackouts or any withdrawal symptoms.   Past Medical History:  Past Medical History:  Diagnosis Date  . Anemia   . Anxiety   . Asthma   . Carpal tunnel syndrome   . Depression   . History of multiple miscarriages   . Hyperlipidemia     Past Surgical History:  Procedure Laterality Date  . BREAST SURGERY  2000   reduction  . COSMETIC SURGERY  2000   breast reduction  . REDUCTION MAMMAPLASTY Bilateral     Family Psychiatric History: Family History  Problem Relation Age of Onset  . Diabetes Mother   . Hypertension Mother   . Hypertension Father   . Heart disease Father   . Early death Brother   . Kidney disease Maternal Grandmother   . Heart disease Brother     Social History:  Social History   Socioeconomic History  . Marital status: Single    Spouse name: Not on file  . Number of children: Not on file  .  Years of education: Not on file  . Highest education level: Not on file  Social Needs  . Financial resource strain: Not on file  . Food insecurity - worry: Not on file  . Food insecurity - inability: Not on file  . Transportation needs - medical: Not on file  . Transportation needs - non-medical: Not on file  Occupational History  . Not on file  Tobacco Use  . Smoking status: Current Every Day Smoker    Packs/day: 0.25    Years: 30.00    Pack years: 7.50    Types: Cigarettes  . Smokeless tobacco: Never Used  Substance and Sexual Activity  . Alcohol use: No  . Drug  use: No  . Sexual activity: Not Currently    Birth control/protection: None  Other Topics Concern  . Not on file  Social History Narrative  . Not on file    Allergies:  Allergies  Allergen Reactions  . Trazodone And Nefazodone Hives    Metabolic Disorder Labs: Lab Results  Component Value Date   HGBA1C 6.0 (H) 05/15/2017   MPG 126 05/15/2017   MPG 126 02/13/2017   No results found for: PROLACTIN Lab Results  Component Value Date   CHOL 200 (H) 05/15/2017   TRIG 172 (H) 05/15/2017   HDL 33 (L) 05/15/2017   CHOLHDL 6.1 (H) 05/15/2017   VLDL 29 03/05/2016   LDLCALC 121 03/05/2016   LDLCALC 113 04/06/2015   Lab Results  Component Value Date   TSH 0.83 11/07/2016   TSH 0.59 03/05/2016    Therapeutic Level Labs: No results found for: LITHIUM No results found for: VALPROATE No components found for:  CBMZ  Current Medications: Current Outpatient Medications  Medication Sig Dispense Refill  . Biotin 5000 MCG CAPS Take 1 capsule by mouth.    . cloNIDine (CATAPRES) 0.1 MG tablet Take 1 tablet (0.1 mg total) by mouth daily as needed. 30 tablet 1  . Cyanocobalamin (VITAMIN B 12 PO) Take 1 tablet by mouth daily.     . cyclobenzaprine (FLEXERIL) 10 MG tablet Take 1 tablet (10 mg total) by mouth 3 (three) times daily as needed for muscle spasms. 30 tablet 0  . furosemide (LASIX) 20 MG tablet Take 1 tablet (20 mg total) by mouth daily. 30 tablet 6  . gabapentin (NEURONTIN) 300 MG capsule Take 1 capsule (300 mg total) by mouth at bedtime. (Patient taking differently: Take 600 mg by mouth 3 (three) times daily. ) 90 capsule 3  . hydrOXYzine (VISTARIL) 50 MG capsule Take 1 capsule (50 mg total) by mouth 3 (three) times daily as needed. 90 capsule 2  . Lidocaine-Hydrocortisone Ace 3-0.5 % CREA Apply topically twice per day. 30 g 0  . linaclotide (LINZESS) 145 MCG CAPS capsule Take 1 capsule (145 mcg total) by mouth daily before breakfast. 90 capsule 1  . meloxicam (MOBIC) 7.5 MG  tablet Take 7.5 mg by mouth 2 (two) times daily.  5  . mirtazapine (REMERON) 45 MG tablet Take 1 tablet (45 mg total) by mouth at bedtime. 30 tablet 2  . mupirocin ointment (BACTROBAN) 2 % Apply 1 application topically 2 (two) times daily. 30 g 0  . nebivolol (BYSTOLIC) 10 MG tablet Take 1 tablet (10 mg total) by mouth daily. 30 tablet 6  . traMADol (ULTRAM) 50 MG tablet Take 50 mg by mouth 3 (three) times daily.     . vitamin C (ASCORBIC ACID) 500 MG tablet Take 500 mg by mouth daily.    Marland Kitchen  Vitamin D, Cholecalciferol, 1000 UNITS CAPS Take 2 capsules by mouth.      No current facility-administered medications for this visit.      Musculoskeletal: Strength & Muscle Tone: within normal limits Gait & Station: unsteady, walking with cane Patient leans: N/A  Psychiatric Specialty Exam: Review of Systems  Gastrointestinal: Negative for abdominal pain, constipation and diarrhea.  Musculoskeletal: Positive for back pain and myalgias. Negative for falls.    Blood pressure 138/82, pulse 85, height 5' 7.5" (1.715 m), weight 253 lb 3.2 oz (114.9 kg), last menstrual period 05/19/2016.Body mass index is 39.07 kg/m.  General Appearance: Casual  Eye Contact:  Good  Speech:  Clear and Coherent and Normal Rate  Volume:  Normal  Mood:  Anxious and Depressed  Affect:  Congruent  Thought Process:  Goal Directed and Descriptions of Associations: Intact  Orientation:  Full (Time, Place, and Person)  Thought Content: Logical   Suicidal Thoughts:  No  Homicidal Thoughts:  No  Memory:  Immediate;   Good Recent;   Good Remote;   Good  Judgement:  Good  Insight:  Good  Psychomotor Activity:  Increased  Concentration:  Concentration: Good and Attention Span: Good  Recall:  Good  Fund of Knowledge: Good  Language: Good  Akathisia:  No  Handed:  Right  AIMS (if indicated): not done  Assets:  Communication Skills Desire for Improvement Housing Social Support  ADL's:  Intact  Cognition: WNL   Sleep:  Fair   Screenings: GAD-7     Office Visit from 02/06/2016 in [facility]  Total GAD-7 Score  21    PHQ2-9     Office Visit from 08/06/2017 in [facility] Office Visit from 05/15/2017 in [facility] Office Visit from 02/13/2017 in [facility] Office Visit from 11/20/2016 in [facility] Office Visit from 11/07/2016 in [facility]  PHQ-2 Total Score  5  5  4  4  4   PHQ-9 Total Score  18  21  18  22  22       I reviewed the information below on 08/15/2017 and agree except where noted or updated. Assessment and Plan: MDD with anxiety-recurrent, severe without psychotic features- worsening; PTSD- stable; Insomnia- worsening      Medication management with supportive therapy. Risks/benefits and SE of the medication discussed. Pt verbalized understanding and verbal consent obtained for treatment.  Affirm with the patient that the medications are taken as ordered. Patient expressed understanding of how their medications were to be used.    Meds: Vistaril 50mg  po TID prn anxiety as related to depression and PTSD increase Remeron 45mg  po qHS for depression, PTSD and sleep Clonidine 0.1mg  po qD prn anxiety Pt is on Bystolic and Neurontin as prescribed by PCP which both have an off label indication for anxiety     Labs: none   Therapy: brief supportive therapy provided. Discussed psychosocial stressors in detail.   Recommended pt look for online support groups for chronic pain   Consultations: Encouraged to follow up with therapist Encouraged to follow up with PCP as needed   Pt denies SI and is at an acute low risk for suicide. Patient told to call clinic if any problems occur. Patient advised to go to ER if they should develop SI/HI, side effects, or if symptoms worsen. Has crisis numbers to call if needed. Pt verbalized understanding.   F/up in 2 months or sooner if needed  Charlcie Cradle, MD 08/15/2017, 11:20 AM

## 2017-08-16 ENCOUNTER — Ambulatory Visit (INDEPENDENT_AMBULATORY_CARE_PROVIDER_SITE_OTHER): Payer: 59 | Admitting: Gastroenterology

## 2017-08-16 ENCOUNTER — Encounter: Payer: Self-pay | Admitting: Family Medicine

## 2017-08-16 ENCOUNTER — Encounter: Payer: Self-pay | Admitting: Gastroenterology

## 2017-08-16 VITALS — BP 140/78 | HR 82 | Ht 67.5 in | Wt 254.0 lb

## 2017-08-16 DIAGNOSIS — N938 Other specified abnormal uterine and vaginal bleeding: Secondary | ICD-10-CM

## 2017-08-16 DIAGNOSIS — K5909 Other constipation: Secondary | ICD-10-CM

## 2017-08-16 DIAGNOSIS — Z1211 Encounter for screening for malignant neoplasm of colon: Secondary | ICD-10-CM | POA: Diagnosis not present

## 2017-08-16 DIAGNOSIS — N950 Postmenopausal bleeding: Secondary | ICD-10-CM

## 2017-08-16 MED ORDER — NA SULFATE-K SULFATE-MG SULF 17.5-3.13-1.6 GM/177ML PO SOLN: 1.0000 | ORAL | 0 refills | Status: DC

## 2017-08-16 NOTE — Progress Notes (Signed)
08/16/2017 Melody Jenkins [id number] 10/15/1965   HISTORY OF PRESENT ILLNESS: This is a 52 year old female with past medical history of severe anxiety and depression and chronic pain.  She is here today at the request of her PCP, Dr. Buelah Jenkins, in order to discuss colonoscopy.  She's never had one in the past.  She reports chronic constipation secondary to her medications.  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  She is moving her bowels daily without straining.  Prior to starting the Melody Jenkins she was having very hard stools and a lot of straining.  She actually had an episode of rectal bleeding, which she was told due to hemorrhoids after an ER visit.  This is the first time that she has ever experienced rectal bleeding.  She was treated with hydrocortisone and that resolved the bleeding issue.  Now is moving her bowels well with the Linzess.  Hgb is normal and TSH was normal less than one year ago.   Past Medical History:  Diagnosis Date  . Anemia   . Anxiety   . Asthma   . Carpal tunnel syndrome   . Depression   . History of multiple miscarriages   . Hyperlipidemia    Past Surgical History:  Procedure Laterality Date  . BREAST SURGERY  2000   reduction  . COSMETIC SURGERY  2000   breast reduction  . REDUCTION MAMMAPLASTY Bilateral     reports that she has been smoking cigarettes.  She has a 7.50 pack-year smoking history. she has never used smokeless tobacco. She reports that she does not drink alcohol or use drugs. family history includes Diabetes in her mother; Early death in her brother; Heart disease in her brother and father; Hypertension in her father and mother; Kidney disease in her maternal grandmother. Allergies  Allergen Reactions  . Trazodone And Nefazodone Hives      Outpatient Encounter Medications as of 08/16/2017  Medication Sig  . Biotin 5000 MCG CAPS Take 1 capsule by mouth.  . cloNIDine  (CATAPRES) 0.1 MG tablet Take 1 tablet (0.1 mg total) by mouth daily as needed.  . Cyanocobalamin (VITAMIN B 12 PO) Take 1 tablet by mouth daily.   . cyclobenzaprine (FLEXERIL) 10 MG tablet Take 1 tablet (10 mg total) by mouth 3 (three) times daily as needed for muscle spasms.  . furosemide (LASIX) 20 MG tablet Take 1 tablet (20 mg total) by mouth daily.  Marland Kitchen gabapentin (NEURONTIN) 300 MG capsule Take 1 capsule (300 mg total) by mouth at bedtime. (Patient taking differently: Take 600 mg by mouth 3 (three) times daily. )  . hydrOXYzine (VISTARIL) 50 MG capsule Take 1 capsule (50 mg total) by mouth 3 (three) times daily as needed.  . Lidocaine-Hydrocortisone Ace 3-0.5 % CREA Apply topically twice per day.  . linaclotide (LINZESS) 145 MCG CAPS capsule Take 1 capsule (145 mcg total) by mouth daily before breakfast.  . meloxicam (MOBIC) 7.5 MG tablet Take 7.5 mg by mouth 2 (two) times daily.  . mirtazapine (REMERON) 45 MG tablet Take 1 tablet (45 mg total) by mouth at bedtime.  . mupirocin ointment (BACTROBAN) 2 % Apply 1 application topically 2 (two) times daily.  . nebivolol (BYSTOLIC) 10 MG tablet Take 1 tablet (10 mg total) by mouth daily.  . traMADol (ULTRAM) 50 MG tablet Take 50 mg by mouth 3 (three) times daily.   . vitamin C (ASCORBIC ACID)  500 MG tablet Take 500 mg by mouth daily.  . Vitamin D, Cholecalciferol, 1000 UNITS CAPS Take 2 capsules by mouth.    No facility-administered encounter medications on file as of 08/16/2017.      REVIEW OF SYSTEMS  : All other systems reviewed and negative except where noted in the History of Present Illness.   PHYSICAL EXAM: Wt 254 lb (115.2 kg)   LMP 05/19/2016 (Approximate)   BMI 39.19 kg/m  General: Well developed black female in no acute distress Head: Normocephalic and atraumatic Eyes:  Sclerae anicteric, conjunctiva pink. Ears: Normal auditory acuity Lungs: Clear throughout to auscultation; no increased WOB. Heart: Regular rate and rhythm;  no M/R/G. Abdomen: Soft, non-distended.  BS present.  Mild diffuse TTP. Rectal:  Will be done at the time of colonoscopy. Musculoskeletal: Symmetrical with no gross deformities  Skin: No lesions on visible extremities Extremities: No edema  Neurological: Alert oriented x 4, grossly non-focal Psychological:  Alert and cooperative. Normal mood and affect  ASSESSMENT AND PLAN: *Screening colonoscopy:  Will schedule with Dr. Havery Jenkins.  The risks, benefits, and alternatives to colonoscopy were discussed with the patient and she consents to proceed. Will give 2 day bowel prep due to issues with chronic constipation. *Rectal bleeding:  Treated with hydrocortisone with resolution of bleeding.  Will be evaluated with colonoscopy. *Chronic constipation, likely medication induced:  Much improved on Linzess 145 mcg daily.  Will continue.   CC:  Melody Jenkins, Melody Nunnery, MD

## 2017-08-16 NOTE — Patient Instructions (Signed)

## 2017-08-16 NOTE — Progress Notes (Signed)
Agree with assessment and plan as outlined.  

## 2017-08-20 ENCOUNTER — Ambulatory Visit (INDEPENDENT_AMBULATORY_CARE_PROVIDER_SITE_OTHER): Payer: 59 | Admitting: Licensed Clinical Social Worker

## 2017-08-20 DIAGNOSIS — F333 Major depressive disorder, recurrent, severe with psychotic symptoms: Secondary | ICD-10-CM

## 2017-08-21 ENCOUNTER — Encounter (HOSPITAL_COMMUNITY): Payer: Self-pay | Admitting: Licensed Clinical Social Worker

## 2017-08-21 NOTE — Progress Notes (Signed)
   THERAPIST PROGRESS NOTE  Session Time: 2:10-3pm  Participation Level: Active  Behavioral Response: CasualAlertDepressed  Type of Therapy: Individual Therapy  Treatment Goals addressed: Coping  Interventions: CBT  Summary: INA SCRIVENS is a 52 y.o. female presents for her individual  counseling session. Pt discussed her psychiatric symptoms and current life events.Pt reports her moods are less explosive. She has been using her mindfulness practice and breathing exercises when she becomes outraged, frustrated. Taught pt emotional regulation skills with the ability for pt to soothe her emotions rather than act on them. Pt participated in a new breathing exercise and mindfulness activity.     Suicidal/Homicidal: Nowithout intent/plan  Therapist Response: Assessed pt's current functioning and reviewed progress. Assisted pt processing emotional regulation skills, moods, self soothing, breathing and mindfulness exercises..Assisted pt  processing for management of stressors.  Plan: individual sessions biweekly.  Diagnosis: Axis I: MDD, recurrent severe, with psychosis        Loleta Frommelt S, LCAS  08/20/17

## 2017-08-22 ENCOUNTER — Encounter: Payer: Self-pay | Admitting: Gastroenterology

## 2017-08-23 MED ORDER — LINACLOTIDE 145 MCG PO CAPS: 145.0000 ug | ORAL_CAPSULE | Freq: Every day | ORAL | 1 refills | Status: DC

## 2017-08-23 NOTE — Telephone Encounter (Signed)
Received determination.    [id number] approved 08/22/2017- 08/23/2018.  Pharmacy made aware.   Patient made aware.

## 2017-08-27 ENCOUNTER — Ambulatory Visit (INDEPENDENT_AMBULATORY_CARE_PROVIDER_SITE_OTHER): Payer: 59 | Admitting: Licensed Clinical Social Worker

## 2017-08-27 ENCOUNTER — Encounter (HOSPITAL_COMMUNITY): Payer: Self-pay | Admitting: Licensed Clinical Social Worker

## 2017-08-27 DIAGNOSIS — F333 Major depressive disorder, recurrent, severe with psychotic symptoms: Secondary | ICD-10-CM

## 2017-08-27 NOTE — Progress Notes (Signed)
   THERAPIST PROGRESS NOTE  Session Time: 10:10-11am  Participation Level: Active  Behavioral Response: CasualAlertDepressed  Type of Therapy: Individual Therapy  Treatment Goals addressed: Coping  Interventions: CBT  Summary: KORRA CHRISTINE is a 52 y.o. female presents for her individual  counseling session. Pt discussed her psychiatric symptoms and current life events.Pt reports her moods are less explosive. She has begun cutting people out of her life: People that are not supportive of her and willing to help her during this difficult time in her life. Validated pt on her choice. Discussed relationships with pt while she became tearful. She is still grieving her fiance, assured pt there is no time limit for grieving. Asked open ended questions about her relationships and used empathic reflection. Pt reports she is still using her mindfulness activities as a coping tool.    Suicidal/Homicidal: Nowithout intent/plan  Therapist Response: Assessed pt's current functioning and reviewed progress. Assisted pt processing  moods, self soothing, relationships, grief, and mindfulness exercises..Assisted pt  processing for management of stressors.  Plan: individual sessions biweekly.  Diagnosis: Axis I: MDD, recurrent severe, with psychosis        MACKENZIE,LISBETH S, LCAS  08/27/17

## 2017-08-29 ENCOUNTER — Encounter: Payer: Self-pay | Admitting: Obstetrics and Gynecology

## 2017-08-29 ENCOUNTER — Encounter: Payer: 59 | Admitting: Obstetrics and Gynecology

## 2017-08-29 ENCOUNTER — Ambulatory Visit (INDEPENDENT_AMBULATORY_CARE_PROVIDER_SITE_OTHER): Payer: 59 | Admitting: Obstetrics and Gynecology

## 2017-08-29 ENCOUNTER — Other Ambulatory Visit: Payer: Self-pay

## 2017-08-29 ENCOUNTER — Other Ambulatory Visit (HOSPITAL_COMMUNITY)
Admission: RE | Admit: 2017-08-29 | Discharge: 2017-08-29 | Disposition: A | Discharge status: Home / Self Care | Payer: 59 | Source: Ambulatory Visit | Attending: Obstetrics and Gynecology | Admitting: Obstetrics and Gynecology

## 2017-08-29 VITALS — BP 138/80 | HR 84 | Resp 18 | Ht 67.75 in | Wt 252.0 lb

## 2017-08-29 DIAGNOSIS — R87618 Other abnormal cytological findings on specimens from cervix uteri: Secondary | ICD-10-CM | POA: Insufficient documentation

## 2017-08-29 DIAGNOSIS — N852 Hypertrophy of uterus: Secondary | ICD-10-CM | POA: Diagnosis not present

## 2017-08-29 DIAGNOSIS — Z124 Encounter for screening for malignant neoplasm of cervix: Secondary | ICD-10-CM

## 2017-08-29 DIAGNOSIS — N882 Stricture and stenosis of cervix uteri: Secondary | ICD-10-CM | POA: Diagnosis not present

## 2017-08-29 DIAGNOSIS — N950 Postmenopausal bleeding: Secondary | ICD-10-CM | POA: Diagnosis not present

## 2017-08-29 DIAGNOSIS — M549 Dorsalgia, unspecified: Secondary | ICD-10-CM

## 2017-08-29 DIAGNOSIS — G8929 Other chronic pain: Secondary | ICD-10-CM | POA: Insufficient documentation

## 2017-08-29 NOTE — Progress Notes (Signed)
52 y.o. [id number] SingleAfrican AmericanF here for a consultation from Dr Buelah Manis for  postmenopause bleeding.  She had a "menstrual cycle" on 08/16/17. She bleed x 5 days. She was saturating a super tampon in 2 hours. No abdominal pain, she had some back pain, but has issues with her back. Prior to 08/16/17 she hadn't had a cycle since 6/17. In 5/18 she had an elevated FSH of 62. No hot flashes or night sweats.  Not sexually active for 5-6 months.   Fiance died in the ER in 25-Jan-2023 unexpectedly after a CT scan for a sore throat. She watched the CPR, was traumatic. He was brain dead, kept on life support for 2 months.     She has chronic lower back pain is disabled. She has been told she needs surgery for a bulging disc, but is afraid to have it. Her Aunt had similar surgery and had complications.    Patient's last menstrual period was 05/19/2016 (approximate).          Sexually active: No.  The current method of family planning is post menopausal status.    Exercising: No.  The patient does not participate in regular exercise at present. Smoker:  yes  Health Maintenance: Pap:  04-06-15 WNL  History of abnormal Pap:  no MMG:  01-24-17 WNL  Colonoscopy: Never BMD:   Never TDaP:  Unsure Gardasil: N/A   reports that she has been smoking cigarettes.  She has a 15.00 pack-year smoking history. She has never used smokeless tobacco. She reports that she does not drink alcohol or use drugs. She is on disability for back pain. Her 75 year old daughter recently moved home to help her (after her fiance died)  Past Medical History:  Diagnosis Date  . Anemia   . Anxiety   . Asthma   . Carpal tunnel syndrome   . Chronic back pain   . Depression   . History of multiple miscarriages   . Hyperlipidemia     Past Surgical History:  Procedure Laterality Date  . BREAST SURGERY  2000   reduction  . CESAREAN SECTION    . COSMETIC SURGERY  2000   breast reduction  . REDUCTION MAMMAPLASTY Bilateral      Current Outpatient Medications  Medication Sig Dispense Refill  . Biotin 5000 MCG CAPS Take 1 capsule by mouth.    . cloNIDine (CATAPRES) 0.1 MG tablet Take 1 tablet (0.1 mg total) by mouth daily as needed. 30 tablet 1  . Cyanocobalamin (VITAMIN B 12 PO) Take 1 tablet by mouth daily.     . cyclobenzaprine (FLEXERIL) 10 MG tablet Take 1 tablet (10 mg total) by mouth 3 (three) times daily as needed for muscle spasms. 30 tablet 0  . furosemide (LASIX) 20 MG tablet Take 1 tablet (20 mg total) by mouth daily. 30 tablet 6  . gabapentin (NEURONTIN) 300 MG capsule Take 600 mg by mouth 3 (three) times daily.    . hydrOXYzine (VISTARIL) 50 MG capsule Take 1 capsule (50 mg total) by mouth 3 (three) times daily as needed. 90 capsule 2  . Lidocaine-Hydrocortisone Ace 3-0.5 % CREA Apply topically twice per day. 30 g 0  . linaclotide (LINZESS) 145 MCG CAPS capsule Take 1 capsule (145 mcg total) by mouth daily before breakfast. 90 capsule 1  . meloxicam (MOBIC) 7.5 MG tablet Take 7.5 mg by mouth 2 (two) times daily.  5  . mirtazapine (REMERON) 45 MG tablet Take 1 tablet (45 mg total) by  mouth at bedtime. 30 tablet 2  . mupirocin ointment (BACTROBAN) 2 % Apply 1 application topically 2 (two) times daily. 30 g 0  . Na Sulfate-K Sulfate-Mg Sulf (SUPREP BOWEL PREP KIT) 17.5-3.13-1.6 GM/177ML SOLN Take 1 kit by mouth as directed. 324 mL 0  . nebivolol (BYSTOLIC) 10 MG tablet Take 1 tablet (10 mg total) by mouth daily. 30 tablet 6  . traMADol (ULTRAM) 50 MG tablet Take 50 mg by mouth 3 (three) times daily.     . vitamin C (ASCORBIC ACID) 500 MG tablet Take 500 mg by mouth daily.    . Vitamin D, Cholecalciferol, 1000 UNITS CAPS Take 2 capsules by mouth.      No current facility-administered medications for this visit.     Family History  Problem Relation Age of Onset  . Diabetes Mother   . Hypertension Mother   . Hypertension Father   . Heart disease Father   . Early death Brother   . Kidney disease  Maternal Grandmother   . Heart disease Brother     Review of Systems  Constitutional: Negative.   HENT: Negative.   Eyes: Negative.   Respiratory: Negative.   Cardiovascular: Negative.   Gastrointestinal: Negative.   Endocrine: Negative.   Genitourinary: Negative.        Postmenopause bleeding   Musculoskeletal: Negative.   Skin: Negative.   Allergic/Immunologic: Negative.   Neurological: Negative.   Psychiatric/Behavioral: Negative.     Exam:   BP 138/80 (BP Location: Right Arm, Patient Position: Sitting, Cuff Size: Large)   Pulse 84   Resp 18   Ht 5' 7.75" (1.721 m)   Wt 252 lb (114.3 kg)   LMP 05/19/2016 (Approximate)   BMI 38.60 kg/m   Weight change: '@WEIGHTCHANGE' @ Height:   Height: 5' 7.75" (172.1 cm)  Ht Readings from Last 3 Encounters:  08/29/17 5' 7.75" (1.721 m)  08/16/17 5' 7.5" (1.715 m)  08/06/17 '5\' 8"'  (1.727 m)    General appearance: alert, cooperative and appears stated age Head: Normocephalic, without obvious abnormality, atraumatic Neck: no adenopathy, supple, symmetrical, trachea midline and thyroid normal to inspection and palpation Lungs: clear to auscultation bilaterally Cardiovascular: regular rate and rhythm Abdomen: soft, non-tender; non distended,  no masses,  no organomegaly Extremities: extremities normal, atraumatic, no cyanosis or edema Skin: Skin color, texture, turgor normal. No rashes or lesions Lymph nodes: Cervical, supraclavicular, and axillary nodes normal. No abnormal inguinal nodes palpated Neurologic: Grossly normal   Pelvic: External genitalia:  no lesions              Urethra:  normal appearing urethra with no masses, tenderness or lesions              Bartholins and Skenes: normal                 Vagina: normal appearing vagina with normal color and discharge, no lesions              Cervix: no lesions               Bimanual Exam:  Uterus:  anteverted, mobile, ~10 week sized, not tender              Adnexa: no mass,  fullness, tenderness               The risks of endometrial biopsy were reviewed and a consent was obtained.  A speculum was placed in the vagina and the cervix was cleansed with betadine. A  tenaculum was placed on the cervix and the cervix was dilated with the mini-dilators. The uterine evacuator was placed into the endometrial cavity. The uterus sounded to 10 cm. The endometrial biopsy was performed, moderate tissue was obtained. The tenaculum and speculum were removed. There were no complications.                   Chaperone was present for exam.  A:  PMP bleeding  Enlarged uterus, reports ? h/o fibroids in the past  Cervical stenosis  P:   Pap with hpv   Endometrial biopsy  FSH  Depending on results possible u/s vs sonohysterogram  CC: Dr Buelah Manis Note sent

## 2017-08-30 ENCOUNTER — Telehealth: Payer: Self-pay | Admitting: Obstetrics and Gynecology

## 2017-08-30 LAB — FOLLICLE STIMULATING HORMONE: FSH: 25 m[IU]/mL

## 2017-08-30 NOTE — Telephone Encounter (Signed)
Patient called about procedure yesterday. Complaining of odor and brown bleeding.

## 2017-08-30 NOTE — Telephone Encounter (Signed)
Spoke with patient, advised as seen below per Dr. Quincy Simmonds. Patient declined OV offered for today, scheduled for 09/02/17 at 4pm with Dr. Talbert Nan. ER precautions reviewed, patient verbalizes understanding.   Routing to provider for final review. Patient is agreeable to disposition. Will close encounter.

## 2017-08-30 NOTE — Telephone Encounter (Signed)
Spoke with patient. Reports brown d/c with odor after EMB, noticed today, spotting yesterday was brighter red, asking if she can douche? And if this is normal? Describes as spotting and reports a new "foul fishy odor".   EMB on 08/29/17 with Dr. Talbert Nan for PMB. Denies pain, fever/chills, N/V.   Advised patient spotting after EMB is to be expected, this should resolve in a few days. Advised patient not to douche until bleeding has resolved. Advised to return call to office should symptoms not resolve or new symptoms develop.   Advised Dr. Talbert Nan is out of the office, covering provider will review, I will return call with any additional recommendations. Patient verbalizes understanding.   Dr. Quincy Simmonds -please review and advise?   Cc: Dr. Talbert Nan

## 2017-08-30 NOTE — Telephone Encounter (Signed)
Odor could be from blood or bacterial vaginosis.  She will need an office visit.   She may see me today or Dr. Talbert Nan on Monday if her symptoms persist.  She would also need to be seen for pain or fever following the EMB.  Do not douche.  Cc- Dr. Talbert Nan

## 2017-09-02 ENCOUNTER — Ambulatory Visit (INDEPENDENT_AMBULATORY_CARE_PROVIDER_SITE_OTHER): Payer: 59 | Admitting: Obstetrics and Gynecology

## 2017-09-02 ENCOUNTER — Encounter: Payer: Self-pay | Admitting: Obstetrics and Gynecology

## 2017-09-02 ENCOUNTER — Other Ambulatory Visit: Payer: Self-pay

## 2017-09-02 VITALS — BP 142/80 | HR 84 | Resp 14 | Wt 253.0 lb

## 2017-09-02 DIAGNOSIS — N760 Acute vaginitis: Secondary | ICD-10-CM | POA: Diagnosis not present

## 2017-09-02 DIAGNOSIS — B9689 Other specified bacterial agents as the cause of diseases classified elsewhere: Secondary | ICD-10-CM | POA: Diagnosis not present

## 2017-09-02 MED ORDER — METRONIDAZOLE 500 MG PO TABS: 500.0000 mg | ORAL_TABLET | Freq: Two times a day (BID) | ORAL | 0 refills | Status: DC

## 2017-09-02 NOTE — Patient Instructions (Signed)
Bacterial Vaginosis Bacterial vaginosis is a vaginal infection that occurs when the normal balance of bacteria in the vagina is disrupted. It results from an overgrowth of certain bacteria. This is the most common vaginal infection among women ages 15-44. Because bacterial vaginosis increases your risk for STIs (sexually transmitted infections), getting treated can help reduce your risk for chlamydia, gonorrhea, herpes, and HIV (human immunodeficiency virus). Treatment is also important for preventing complications in pregnant women, because this condition can cause an early (premature) delivery. What are the causes? This condition is caused by an increase in harmful bacteria that are normally present in small amounts in the vagina. However, the reason that the condition develops is not fully understood. What increases the risk? The following factors may make you more likely to develop this condition:  Having a new sexual partner or multiple sexual partners.  Having unprotected sex.  Douching.  Having an intrauterine device (IUD).  Smoking.  Drug and alcohol abuse.  Taking certain antibiotic medicines.  Being pregnant.  You cannot get bacterial vaginosis from toilet seats, bedding, swimming pools, or contact with objects around you. What are the signs or symptoms? Symptoms of this condition include:  Grey or white vaginal discharge. The discharge can also be watery or foamy.  A fish-like odor with discharge, especially after sexual intercourse or during menstruation.  Itching in and around the vagina.  Burning or pain with urination.  Some women with bacterial vaginosis have no signs or symptoms. How is this diagnosed? This condition is diagnosed based on:  Your medical history.  A physical exam of the vagina.  Testing a sample of vaginal fluid under a microscope to look for a large amount of bad bacteria or abnormal cells. Your health care provider may use a cotton swab  or a small wooden spatula to collect the sample.  How is this treated? This condition is treated with antibiotics. These may be given as a pill, a vaginal cream, or a medicine that is put into the vagina (suppository). If the condition comes back after treatment, a second round of antibiotics may be needed. Follow these instructions at home: Medicines  Take over-the-counter and prescription medicines only as told by your health care provider.  Take or use your antibiotic as told by your health care provider. Do not stop taking or using the antibiotic even if you start to feel better. General instructions  If you have a female sexual partner, tell her that you have a vaginal infection. She should see her health care provider and be treated if she has symptoms. If you have a female sexual partner, he does not need treatment.  During treatment: ? Avoid sexual activity until you finish treatment. ? Do not douche. ? Avoid alcohol as directed by your health care provider. ? Avoid breastfeeding as directed by your health care provider.  Drink enough water and fluids to keep your urine clear or pale yellow.  Keep the area around your vagina and rectum clean. ? Wash the area daily with warm water. ? Wipe yourself from front to back after using the toilet.  Keep all follow-up visits as told by your health care provider. This is important. How is this prevented?  Do not douche.  Wash the outside of your vagina with warm water only.  Use protection when having sex. This includes latex condoms and dental dams.  Limit how many sexual partners you have. To help prevent bacterial vaginosis, it is best to have sex with just   one partner (monogamous).  Make sure you and your sexual partner are tested for STIs.  Wear cotton or cotton-lined underwear.  Avoid wearing tight pants and pantyhose, especially during summer.  Limit the amount of alcohol that you drink.  Do not use any products that  contain nicotine or tobacco, such as cigarettes and e-cigarettes. If you need help quitting, ask your health care provider.  Do not use illegal drugs. Where to find more information:  Centers for Disease Control and Prevention: www.cdc.gov/std  American Sexual Health Association (ASHA): www.ashastd.org  U.S. Department of Health and Human Services, Office on Women's Health: www.womenshealth.gov/ or https://www.womenshealth.gov/a-z-topics/bacterial-vaginosis Contact a health care provider if:  Your symptoms do not improve, even after treatment.  You have more discharge or pain when urinating.  You have a fever.  You have pain in your abdomen.  You have pain during sex.  You have vaginal bleeding between periods. Summary  Bacterial vaginosis is a vaginal infection that occurs when the normal balance of bacteria in the vagina is disrupted.  Because bacterial vaginosis increases your risk for STIs (sexually transmitted infections), getting treated can help reduce your risk for chlamydia, gonorrhea, herpes, and HIV (human immunodeficiency virus). Treatment is also important for preventing complications in pregnant women, because the condition can cause an early (premature) delivery.  This condition is treated with antibiotic medicines. These may be given as a pill, a vaginal cream, or a medicine that is put into the vagina (suppository). This information is not intended to replace advice given to you by your health care provider. Make sure you discuss any questions you have with your health care provider. Document Released: 05/28/2005 Document Revised: 10/01/2016 Document Reviewed: 02/11/2016 Elsevier Interactive Patient Education  2018 [employer] Inc[employer]  

## 2017-09-02 NOTE — Progress Notes (Signed)
GYNECOLOGY  VISIT   HPI: 52 y.o.   Single  African American  female   [phone] with Patient's last menstrual period was 05/19/2016 (approximate).   here c/o vaginal discharge and odor x 4 days    She c/o a brown d/c with an odor since her biopsy. Initially it was red. No pain or fever.  She is very tearful talking about her fiance.   GYNECOLOGIC HISTORY: Patient's last menstrual period was 05/19/2016 (approximate). Contraception:postmenopause  Menopausal hormone therapy: none         OB History    Gravida  5   Para  1   Term  1   Preterm      AB  4   Living  1     SAB  3   TAB      Ectopic  1   Multiple      Live Births  1              Patient Active Problem List   Diagnosis Date Noted  . Chronic back pain   . Special screening for malignant neoplasms, colon 08/16/2017  . Glucose intolerance 02/13/2017  . Leg swelling 02/13/2017  . Chronic constipation 11/20/2016  . Hot flashes 11/20/2016  . Tear of MCL (medial collateral ligament) of knee, left, subsequent encounter 04/06/2016  . Anterolisthesis 04/06/2016  . Situational mixed anxiety and depressive disorder 02/26/2016  . Insomnia 02/24/2016  . Back pain with radiation 02/14/2016  . Grief reaction 02/14/2016  . Essential hypertension, benign 12/26/2015  . DDD (degenerative disc disease), lumbar 12/26/2015  . Seasonal allergies 12/26/2015  . Tobacco user 04/06/2015  . Obesity 04/06/2015  . Anemia   . Asthma   . Hyperlipidemia     Past Medical History:  Diagnosis Date  . Anemia   . Anxiety   . Asthma   . Carpal tunnel syndrome   . Chronic back pain   . Depression   . History of multiple miscarriages   . Hyperlipidemia     Past Surgical History:  Procedure Laterality Date  . BREAST SURGERY  2000   reduction  . CESAREAN SECTION    . COSMETIC SURGERY  2000   breast reduction  . REDUCTION MAMMAPLASTY Bilateral     Current Outpatient Medications  Medication Sig Dispense Refill  .  Biotin 5000 MCG CAPS Take 1 capsule by mouth.    . cloNIDine (CATAPRES) 0.1 MG tablet Take 1 tablet (0.1 mg total) by mouth daily as needed. 30 tablet 1  . Cyanocobalamin (VITAMIN B 12 PO) Take 1 tablet by mouth daily.     . cyclobenzaprine (FLEXERIL) 10 MG tablet Take 1 tablet (10 mg total) by mouth 3 (three) times daily as needed for muscle spasms. 30 tablet 0  . furosemide (LASIX) 20 MG tablet Take 1 tablet (20 mg total) by mouth daily. 30 tablet 6  . gabapentin (NEURONTIN) 300 MG capsule Take 600 mg by mouth 3 (three) times daily.    . hydrOXYzine (VISTARIL) 50 MG capsule Take 1 capsule (50 mg total) by mouth 3 (three) times daily as needed. 90 capsule 2  . Lidocaine-Hydrocortisone Ace 3-0.5 % CREA Apply topically twice per day. 30 g 0  . linaclotide (LINZESS) 145 MCG CAPS capsule Take 1 capsule (145 mcg total) by mouth daily before breakfast. 90 capsule 1  . meloxicam (MOBIC) 7.5 MG tablet Take 7.5 mg by mouth 2 (two) times daily.  5  . mirtazapine (REMERON) 45 MG tablet Take  1 tablet (45 mg total) by mouth at bedtime. 30 tablet 2  . mupirocin ointment (BACTROBAN) 2 % Apply 1 application topically 2 (two) times daily. 30 g 0  . Na Sulfate-K Sulfate-Mg Sulf (SUPREP BOWEL PREP KIT) 17.5-3.13-1.6 GM/177ML SOLN Take 1 kit by mouth as directed. 324 mL 0  . nebivolol (BYSTOLIC) 10 MG tablet Take 1 tablet (10 mg total) by mouth daily. 30 tablet 6  . traMADol (ULTRAM) 50 MG tablet Take 50 mg by mouth 3 (three) times daily.     . vitamin C (ASCORBIC ACID) 500 MG tablet Take 500 mg by mouth daily.    . Vitamin D, Cholecalciferol, 1000 UNITS CAPS Take 2 capsules by mouth.      No current facility-administered medications for this visit.      ALLERGIES: Trazodone and nefazodone  Family History  Problem Relation Age of Onset  . Diabetes Mother   . Hypertension Mother   . Hypertension Father   . Heart disease Father   . Early death Brother   . Kidney disease Maternal Grandmother   . Heart  disease Brother     Social History   Socioeconomic History  . Marital status: Single    Spouse name: Not on file  . Number of children: 1  . Years of education: Not on file  . Highest education level: Not on file  Occupational History  . Occupation: disablity  Social Needs  . Financial resource strain: Not on file  . Food insecurity:    Worry: Not on file    Inability: Not on file  . Transportation needs:    Medical: Not on file    Non-medical: Not on file  Tobacco Use  . Smoking status: Current Every Day Smoker    Packs/day: 0.50    Years: 30.00    Pack years: 15.00    Types: Cigarettes  . Smokeless tobacco: Never Used  Substance and Sexual Activity  . Alcohol use: No  . Drug use: No  . Sexual activity: Not Currently    Partners: Male    Birth control/protection: Post-menopausal  Lifestyle  . Physical activity:    Days per week: Not on file    Minutes per session: Not on file  . Stress: Not on file  Relationships  . Social connections:    Talks on phone: Not on file    Gets together: Not on file    Attends religious service: Not on file    Active member of club or organization: Not on file    Attends meetings of clubs or organizations: Not on file    Relationship status: Not on file  . Intimate partner violence:    Fear of current or ex partner: Not on file    Emotionally abused: Not on file    Physically abused: Not on file    Forced sexual activity: Not on file  Other Topics Concern  . Not on file  Social History Narrative  . Not on file    Review of Systems  Constitutional: Negative.   HENT: Negative.   Eyes: Negative.   Respiratory: Negative.   Cardiovascular: Negative.   Gastrointestinal: Negative.   Genitourinary:       Vaginal discharge and odor   Musculoskeletal: Negative.   Skin: Negative.   Neurological: Negative.   Endo/Heme/Allergies: Negative.   Psychiatric/Behavioral: Negative.     PHYSICAL EXAMINATION:    BP (!) 142/80 (BP  Location: Right Arm, Patient Position: Sitting, Cuff Size: Large)  Pulse 84   Resp 14   Wt 253 lb (114.8 kg)   LMP 05/19/2016 (Approximate)   BMI 38.75 kg/m     General appearance: alert, cooperative and appears stated age  Pelvic: External genitalia:  no lesions              Urethra:  normal appearing urethra with no masses, tenderness or lesions              Bartholins and Skenes: normal                 Vagina: normal appearing vagina with normal color. Slight increase in vaginal d/c, brown, watery.              Cervix: no cervical motion tenderness and no lesions              Bimanual Exam:  Uterus:  slightly enlarged, not tender, mobile              Adnexa: no mass, fullness, tenderness              Chaperone was present for exam.  Wet prep: ++ clue, no trich, no wbc KOH: no yeast PH: 5.5   ASSESSMENT Bacterial vaginitis PMP bleeding, FSH just in the menopausal range    PLAN Treat with Flagyl, no ETOH Endometrial biopsy and pap are pending Will make further plans after results are back. At minimum will set her up for an ultrasound (uterus slightly enlarged)   An After Visit Summary was printed and given to the patient.  Over 15 minutes face to face time of which over 50% was spent in counseling.

## 2017-09-03 LAB — CYTOLOGY - PAP: HPV: NOT DETECTED

## 2017-09-04 ENCOUNTER — Ambulatory Visit (HOSPITAL_COMMUNITY): Payer: Self-pay | Admitting: Licensed Clinical Social Worker

## 2017-09-04 ENCOUNTER — Telehealth: Payer: Self-pay

## 2017-09-04 NOTE — Telephone Encounter (Signed)
-----   Message from Salvadore Dom, MD sent at 09/03/2017  5:25 PM EDT ----- Please let the patient know that her pap returned with atypical glandular cells (endometrial). Prior to her ultrasound/sonohysterogram I need to do an ECC. I can do it the same day, but it needs to be prior to using the ultrasound gel. Please let the patient know and arrange this.

## 2017-09-04 NOTE — Telephone Encounter (Signed)
Spoke with patient. Advised of message as seen below from [place]. Patient verbalizes understanding. PUS/SHGM scheduled for 09/17/2017 at 2:30 pm. Note placed on appointment desk that patient will need ECC before PUS. Encounter closed.

## 2017-09-05 ENCOUNTER — Encounter: Payer: Self-pay | Admitting: Gastroenterology

## 2017-09-05 ENCOUNTER — Other Ambulatory Visit: Payer: Self-pay

## 2017-09-05 ENCOUNTER — Ambulatory Visit (AMBULATORY_SURGERY_CENTER): Payer: 59 | Admitting: Gastroenterology

## 2017-09-05 VITALS — BP 115/57 | HR 76 | Temp 97.8°F | Resp 15 | Ht 67.75 in | Wt 252.0 lb

## 2017-09-05 DIAGNOSIS — Z1211 Encounter for screening for malignant neoplasm of colon: Secondary | ICD-10-CM | POA: Diagnosis not present

## 2017-09-05 DIAGNOSIS — D122 Benign neoplasm of ascending colon: Secondary | ICD-10-CM | POA: Diagnosis not present

## 2017-09-05 DIAGNOSIS — D123 Benign neoplasm of transverse colon: Secondary | ICD-10-CM | POA: Diagnosis not present

## 2017-09-05 DIAGNOSIS — D127 Benign neoplasm of rectosigmoid junction: Secondary | ICD-10-CM

## 2017-09-05 DIAGNOSIS — K635 Polyp of colon: Secondary | ICD-10-CM

## 2017-09-05 MED ORDER — SODIUM CHLORIDE 0.9 % IV SOLN: 500.0000 mL | Freq: Once | INTRAVENOUS | Status: DC

## 2017-09-05 NOTE — Progress Notes (Signed)
Report to RN, VSS, adequate respirations noted, no c/o pain or discomfort 

## 2017-09-05 NOTE — Progress Notes (Signed)
Pt's states no medical or surgical changes since previsit or office visit. 

## 2017-09-05 NOTE — Patient Instructions (Signed)
Information given on hemorrhoids and polyps.  YOU HAD AN ENDOSCOPIC PROCEDURE TODAY AT [facility]:   Refer to the procedure report that was given to you for any specific questions about what was found during the examination.  If the procedure report does not answer your questions, please call your gastroenterologist to clarify.  If you requested that your care partner not be given the details of your procedure findings, then the procedure report has been included in a sealed envelope for you to review at your convenience later.  YOU SHOULD EXPECT: Some feelings of bloating in the abdomen. Passage of more gas than usual.  Walking can help get rid of the air that was put into your GI tract during the procedure and reduce the bloating. If you had a lower endoscopy (such as a colonoscopy or flexible sigmoidoscopy) you may notice spotting of blood in your stool or on the toilet paper. If you underwent a bowel prep for your procedure, you may not have a normal bowel movement for a few days.  Please Note:  You might notice some irritation and congestion in your nose or some drainage.  This is from the oxygen used during your procedure.  There is no need for concern and it should clear up in a day or so.  SYMPTOMS TO REPORT IMMEDIATELY:   Following lower endoscopy (colonoscopy or flexible sigmoidoscopy):  Excessive amounts of blood in the stool  Significant tenderness or worsening of abdominal pains  Swelling of the abdomen that is new, acute  Fever of 100F or higher For urgent or emergent issues, a gastroenterologist can be reached at any hour by calling [phone].   DIET:  We do recommend a small meal at first, but then you may proceed to your regular diet.  Drink plenty of fluids but you should avoid alcoholic beverages for 24 hours.  ACTIVITY:  You should plan to take it easy for the rest of today and you should NOT DRIVE or use heavy machinery until tomorrow (because of the  sedation medicines used during the test).    FOLLOW UP: Our staff will call the number listed on your records the next business day following your procedure to check on you and address any questions or concerns that you may have regarding the information given to you following your procedure. If we do not reach you, we will leave a message.  However, if you are feeling well and you are not experiencing any problems, there is no need to return our call.  We will assume that you have returned to your regular daily activities without incident.  If any biopsies were taken you will be contacted by phone or by letter within the next 1-3 weeks.  Please call us at [phone] if you have not heard about the biopsies in 3 weeks.    SIGNATURES/CONFIDENTIALITY: You and/or your care partner have signed paperwork which will be entered into your electronic medical record.  These signatures attest to the fact that that the information above on your After Visit Summary has been reviewed and is understood.  Full responsibility of the confidentiality of this discharge information lies with you and/or your care-partner.

## 2017-09-05 NOTE — Op Note (Signed)
[facility] Patient Name: Melody Jenkins Procedure Date: 09/05/2017 3:05 PM MRN: [id number] Endoscopist: Remo Lipps P. Armbruster MD, MD Age: 52 Referring MD:  Date of Birth: [birth year] Gender: Female Account #: [id number] Procedure:                Colonoscopy Indications:              Screening for colorectal malignant neoplasm, This                            is the patient's first colonoscopy Medicines:                Monitored Anesthesia Care Procedure:                Pre-Anesthesia Assessment:                           - Prior to the procedure, a History and Physical                            was performed, and patient medications and                            allergies were reviewed. The patient's tolerance of                            previous anesthesia was also reviewed. The risks                            and benefits of the procedure and the sedation                            options and risks were discussed with the patient.                            All questions were answered, and informed consent                            was obtained. Prior Anticoagulants: The patient has                            taken no previous anticoagulant or antiplatelet                            agents. ASA Grade Assessment: II - A patient with                            mild systemic disease. After reviewing the risks                            and benefits, the patient was deemed in                            satisfactory condition to undergo the procedure.  After obtaining informed consent, the colonoscope                            was passed under direct vision. Throughout the                            procedure, the patient's blood pressure, pulse, and                            oxygen saturations were monitored continuously. The                            Model PCF-H190DL [id number]) scope was introduced                            through the anus  and advanced to the the cecum,                            identified by appendiceal orifice and ileocecal                            valve. The colonoscopy was performed without                            difficulty. The patient tolerated the procedure                            well. The quality of the bowel preparation was                            adequate. The ileocecal valve, appendiceal orifice,                            and rectum were photographed. Scope In: 3:10:32 PM Scope Out: 3:35:03 PM Scope Withdrawal Time: 0 hours 21 minutes 34 seconds  Total Procedure Duration: 0 hours 24 minutes 31 seconds  Findings:                 The perianal and digital rectal examinations were                            normal.                           A 4 mm polyp was found in the ascending colon. The                            polyp was sessile. The polyp was removed with a                            cold snare. Resection and retrieval were complete.                           Two sessile polyps were found in the transverse  colon. The polyps were 3 to 8 mm in size. These                            polyps were removed with a cold snare. Resection                            and retrieval were complete.                           A 4 mm polyp was found in the splenic flexure. The                            polyp was sessile. The polyp was removed with a                            cold snare. Resection and retrieval were complete.                           A 4 mm polyp was found in the recto-sigmoid colon.                            The polyp was sessile. The polyp was removed with a                            cold snare. Resection and retrieval were complete.                           Multiple medium-mouthed diverticula were found in                            the entire colon.                           Internal hemorrhoids were found during retroflexion.                            The exam was otherwise without abnormality. Complications:            No immediate complications. Estimated blood loss:                            Minimal. Estimated Blood Loss:     Estimated blood loss was minimal. Impression:               - One 4 mm polyp in the ascending colon, removed                            with a cold snare. Resected and retrieved.                           - Two 3 to 8 mm polyps in the transverse colon,                            removed  with a cold snare. Resected and retrieved.                           - One 4 mm polyp at the splenic flexure, removed                            with a cold snare. Resected and retrieved.                           - One 4 mm polyp at the recto-sigmoid colon,                            removed with a cold snare. Resected and retrieved.                           - Diverticulosis in the entire examined colon.                           - Internal hemorrhoids.                           - The examination was otherwise normal. Recommendation:           - Patient has a contact number available for                            emergencies. The signs and symptoms of potential                            delayed complications were discussed with the                            patient. Return to normal activities tomorrow.                            Written discharge instructions were provided to the                            patient.                           - Resume previous diet.                           - Continue present medications.                           - Await pathology results.                           - Repeat colonoscopy is recommended for                            surveillance. The colonoscopy date will be  determined after pathology results from today's                            exam become available for review. Remo Lipps P. Armbruster MD, MD 09/05/2017 3:42:16 PM This report has been  signed electronically.

## 2017-09-06 ENCOUNTER — Telehealth: Payer: Self-pay

## 2017-09-06 NOTE — Telephone Encounter (Signed)
  Follow up Call-  Call back number 09/05/2017  Post procedure Call Back phone  # [phone]  Permission to leave phone message Yes  Some recent data might be hidden     Patient questions:  Do you have a fever, pain , or abdominal swelling? No. Pain Score  0 *  Have you tolerated food without any problems? Yes.    Have you been able to return to your normal activities? Yes.    Do you have any questions about your discharge instructions: Diet   No. Medications  No. Follow up visit  No.  Do you have questions or concerns about your Care? No.  Actions: * If pain score is 4 or above: No action needed, pain <4.

## 2017-09-06 NOTE — Telephone Encounter (Signed)
Thanks Jan 

## 2017-09-06 NOTE — Telephone Encounter (Signed)
-----   Message from Yetta Flock, MD sent at 09/05/2017  4:55 PM EDT ----- Regarding: banding Hey Jan, Can you help schedule a hemorrhoid banding for this patient? Thanks

## 2017-09-06 NOTE — Telephone Encounter (Signed)
Called and spoke to pt.  Pt scheduled for 1st and 2nd banding appts.

## 2017-09-09 ENCOUNTER — Telehealth: Payer: Self-pay | Admitting: Obstetrics and Gynecology

## 2017-09-09 ENCOUNTER — Encounter: Payer: Self-pay | Admitting: Obstetrics and Gynecology

## 2017-09-09 NOTE — Telephone Encounter (Signed)
-----   Message from [employer], Generic sent at 09/09/2017 1:30 PM EDT -----    I have a question about FOLLICLE STIMULATING HORMONE resulte4d on 08/30/17 at 3:36 AM.      Please explain layman terms what all result are saying?

## 2017-09-11 ENCOUNTER — Encounter: Payer: Self-pay | Admitting: Gastroenterology

## 2017-09-11 NOTE — Telephone Encounter (Signed)
Left message to call Issac Moure at [phone].  

## 2017-09-11 NOTE — Telephone Encounter (Signed)
Spoke with patient, reviewed results and recommendations per Dr. Talbert Nan for labs, pap and EMB dated 08/29/17. Questions answered. Patient will see Dr. Talbert Nan on 09/17/17 for ECC, PUS/SHGM. Patient thankful for return call and verbalizes understanding.   Routing to provider for final review. Patient is agreeable to disposition. Will close encounter.

## 2017-09-12 ENCOUNTER — Encounter: Payer: Self-pay | Admitting: Gastroenterology

## 2017-09-13 ENCOUNTER — Encounter: Payer: Self-pay | Admitting: Family Medicine

## 2017-09-13 ENCOUNTER — Other Ambulatory Visit: Payer: Self-pay

## 2017-09-13 ENCOUNTER — Ambulatory Visit (INDEPENDENT_AMBULATORY_CARE_PROVIDER_SITE_OTHER): Payer: 59 | Admitting: Family Medicine

## 2017-09-13 VITALS — BP 120/80 | HR 85 | Temp 98.2°F | Resp 16 | Ht 67.0 in | Wt 251.0 lb

## 2017-09-13 DIAGNOSIS — E7439 Other disorders of intestinal carbohydrate absorption: Secondary | ICD-10-CM | POA: Diagnosis not present

## 2017-09-13 DIAGNOSIS — I10 Essential (primary) hypertension: Secondary | ICD-10-CM | POA: Diagnosis not present

## 2017-09-13 DIAGNOSIS — E782 Mixed hyperlipidemia: Secondary | ICD-10-CM

## 2017-09-13 DIAGNOSIS — Z0000 Encounter for general adult medical examination without abnormal findings: Secondary | ICD-10-CM

## 2017-09-13 DIAGNOSIS — Z720 Tobacco use: Secondary | ICD-10-CM

## 2017-09-13 DIAGNOSIS — E559 Vitamin D deficiency, unspecified: Secondary | ICD-10-CM

## 2017-09-13 MED ORDER — NICOTINE 14 MG/24HR TD PT24: 14.0000 mg | MEDICATED_PATCH | Freq: Every day | TRANSDERMAL | 0 refills | Status: DC

## 2017-09-13 NOTE — Progress Notes (Signed)
   Subjective:    Patient ID: Melody Jenkins, female    DOB: [birth year], 52 y.o.   MRN: [id number]  Patient presents for Annual Exam   Pt here for CPE, medications reviewed    Mammogram UTD  has GYN after she called after bleeding lasted 5 days, during Post menopause, she had atypical glandular cells on PAP Smear, now to have ECC/Sonogram   Colonoscopy UTD - has internal hemorroids will have banding in June, had 5 polyps, has repeat in 3 years, divericulosis , taking Linzess    Due for fasting labs   Down to 5 cig /day   Review Of Systems:  GEN- denies fatigue, fever, weight loss,weakness, recent illness HEENT- denies eye drainage, change in vision, nasal discharge, CVS- denies chest pain, palpitations RESP- denies SOB, cough, wheeze ABD- denies N/V, change in stools, abd pain GU- denies dysuria, hematuria, dribbling, incontinence MSK- + joint pain, muscle aches, injury Neuro- denies headache, dizziness, syncope, seizure activity       Objective:    BP 120/80   Pulse 85   Temp 98.2 F (36.8 C) (Oral)   Resp 16   Ht 5\' 7"  (1.702 m)   Wt 251 lb (113.9 kg)   LMP 05/19/2016 (Approximate)   SpO2 97%   BMI 39.31 kg/m  GEN- NAD, alert and oriented x3 HEENT- PERRL, EOMI, non injected sclera, pink conjunctiva, MMM, oropharynx clear Neck- Supple, no thyromegaly CVS- RRR, no murmur RESP-CTAB ABD-NABS,soft,NT,ND EXT- No edema Pulses- Radial, DP- 2+        Assessment & Plan:      Problem List Items Addressed This Visit      Unprioritized   Glucose intolerance   Relevant Orders   Hemoglobin A1c (Completed)   Tobacco user    couseled on cessation Also discussed nicotine patches can be used      Hyperlipidemia - Primary    Check lipids      Relevant Orders   Lipid panel (Completed)   Essential hypertension, benign    Controlled no changes        Other Visit Diagnoses    Routine general medical examination at a health care facility       CPE done, f/u  GYN next week for further work up, immunizations UTD   Relevant Orders   CBC with Differential/Platelet (Completed)   Comprehensive metabolic panel (Completed)   Lipid panel (Completed)   Vitamin D deficiency       Relevant Orders   Vitamin D, 25-hydroxy (Completed)      Note: This dictation was prepared with Dragon dictation along with smaller phrase technology. Any transcriptional errors that result from this process are unintentional.

## 2017-09-13 NOTE — Patient Instructions (Addendum)
F/U 4 months  Try the nicoderm patch 14mg  down to 7mg 

## 2017-09-14 LAB — COMPREHENSIVE METABOLIC PANEL
AG RATIO: 1.4 (calc) (ref 1.0–2.5)
ALKALINE PHOSPHATASE (APISO): 79 U/L (ref 33–130)
ALT: 26 U/L (ref 6–29)
AST: 18 U/L (ref 10–35)
Albumin: 4.1 g/dL (ref 3.6–5.1)
BILIRUBIN TOTAL: 0.5 mg/dL (ref 0.2–1.2)
BUN: 18 mg/dL (ref 7–25)
CALCIUM: 9.7 mg/dL (ref 8.6–10.4)
CHLORIDE: 106 mmol/L (ref 98–110)
CO2: 30 mmol/L (ref 20–32)
Creat: 0.81 mg/dL (ref 0.50–1.05)
GLOBULIN: 2.9 g/dL (ref 1.9–3.7)
Glucose, Bld: 154 mg/dL — ABNORMAL HIGH (ref 65–99)
Potassium: 5.1 mmol/L (ref 3.5–5.3)
Sodium: 141 mmol/L (ref 135–146)
Total Protein: 7 g/dL (ref 6.1–8.1)

## 2017-09-14 LAB — VITAMIN D 25 HYDROXY (VIT D DEFICIENCY, FRACTURES): Vit D, 25-Hydroxy: 19 ng/mL — ABNORMAL LOW (ref 30–100)

## 2017-09-14 LAB — CBC WITH DIFFERENTIAL/PLATELET
Basophils Absolute: 39 cells/uL (ref 0–200)
Basophils Relative: 0.6 %
EOS PCT: 2.6 %
Eosinophils Absolute: 169 cells/uL (ref 15–500)
HEMATOCRIT: 43.3 % (ref 35.0–45.0)
HEMOGLOBIN: 14.2 g/dL (ref 11.7–15.5)
LYMPHS ABS: 3029 {cells}/uL (ref 850–3900)
MCH: 26.8 pg — ABNORMAL LOW (ref 27.0–33.0)
MCHC: 32.8 g/dL (ref 32.0–36.0)
MCV: 81.9 fL (ref 80.0–100.0)
MPV: 10.8 fL (ref 7.5–12.5)
Monocytes Relative: 8.1 %
NEUTROS ABS: 2737 {cells}/uL (ref 1500–7800)
NEUTROS PCT: 42.1 %
Platelets: 255 10*3/uL (ref 140–400)
RBC: 5.29 10*6/uL — AB (ref 3.80–5.10)
RDW: 13.9 % (ref 11.0–15.0)
Total Lymphocyte: 46.6 %
WBC: 6.5 10*3/uL (ref 3.8–10.8)
WBCMIX: 527 {cells}/uL (ref 200–950)

## 2017-09-14 LAB — HEMOGLOBIN A1C
HEMOGLOBIN A1C: 6.3 %{Hb} — AB (ref <5.7)
MEAN PLASMA GLUCOSE: 134 (calc)
eAG (mmol/L): 7.4 (calc)

## 2017-09-14 LAB — LIPID PANEL
CHOLESTEROL: 199 mg/dL (ref <200)
HDL: 35 mg/dL — ABNORMAL LOW (ref >50)
LDL CHOLESTEROL (CALC): 136 mg/dL — AB
Non-HDL Cholesterol (Calc): 164 mg/dL (calc) — ABNORMAL HIGH (ref <130)
Total CHOL/HDL Ratio: 5.7 (calc) — ABNORMAL HIGH (ref <5.0)
Triglycerides: 146 mg/dL (ref <150)

## 2017-09-15 ENCOUNTER — Encounter: Payer: Self-pay | Admitting: Family Medicine

## 2017-09-15 NOTE — Assessment & Plan Note (Signed)
Check lipids 

## 2017-09-15 NOTE — Assessment & Plan Note (Signed)
couseled on cessation Also discussed nicotine patches can be used

## 2017-09-15 NOTE — Assessment & Plan Note (Signed)
Controlled no changes 

## 2017-09-16 ENCOUNTER — Telehealth: Payer: Self-pay | Admitting: Obstetrics and Gynecology

## 2017-09-16 ENCOUNTER — Encounter: Payer: Self-pay | Admitting: Family Medicine

## 2017-09-16 ENCOUNTER — Other Ambulatory Visit: Payer: Self-pay | Admitting: *Deleted

## 2017-09-16 ENCOUNTER — Other Ambulatory Visit: Payer: Self-pay

## 2017-09-16 ENCOUNTER — Ambulatory Visit (INDEPENDENT_AMBULATORY_CARE_PROVIDER_SITE_OTHER): Payer: 59 | Admitting: Licensed Clinical Social Worker

## 2017-09-16 ENCOUNTER — Encounter (HOSPITAL_COMMUNITY): Payer: Self-pay | Admitting: Licensed Clinical Social Worker

## 2017-09-16 DIAGNOSIS — N950 Postmenopausal bleeding: Secondary | ICD-10-CM

## 2017-09-16 DIAGNOSIS — F333 Major depressive disorder, recurrent, severe with psychotic symptoms: Secondary | ICD-10-CM

## 2017-09-16 MED ORDER — VITAMIN D (ERGOCALCIFEROL) 1.25 MG (50000 UNIT) PO CAPS: 50000.0000 [IU] | ORAL_CAPSULE | ORAL | 0 refills | Status: DC

## 2017-09-16 NOTE — Progress Notes (Signed)
   THERAPIST PROGRESS NOTE  Session Time: 10:10-11am  Participation Level: Active  Behavioral Response: CasualAlertDepressed  Type of Therapy: Individual Therapy  Treatment Goals addressed: Coping  Interventions: CBT  Summary: Melody Jenkins is a 52 y.o. female presents for her individual  counseling session. Pt discussed her psychiatric symptoms and current life events. Pt reports she has had some medical issues since her last therapy session. She is very frustrated and angry with the continued problems. Processed gratitude.with pt. Suggesting to her to work on being grateful daily. Pt has had a lot of good things happen in her life, but she sometimes forgets because of the continuous medical issues. Asked open ended questions and used empathic reflection. Pt discussed her new dx and symptoms of her medical issues. Worked through each issue with pt with a problem solving plan.    Suicidal/Homicidal: Nowithout intent/plan  Therapist Response: Assessed pt's current functioning and reviewed progress. Assisted pt processing  Gratitude, new medical issues, using empathic reflection and asking open ended questions..Assisted pt  processing for management of stressors.  Plan: individual sessions biweekly.  Diagnosis: Axis I: MDD, recurrent severe, with psychosis        Elzora Cullins S, LCAS  09/16/17

## 2017-09-17 ENCOUNTER — Encounter: Payer: Self-pay | Admitting: Obstetrics and Gynecology

## 2017-09-17 ENCOUNTER — Other Ambulatory Visit: Payer: Self-pay | Admitting: Obstetrics and Gynecology

## 2017-09-17 ENCOUNTER — Ambulatory Visit (INDEPENDENT_AMBULATORY_CARE_PROVIDER_SITE_OTHER): Payer: 59 | Admitting: Obstetrics and Gynecology

## 2017-09-17 ENCOUNTER — Other Ambulatory Visit: Payer: Self-pay

## 2017-09-17 ENCOUNTER — Ambulatory Visit (INDEPENDENT_AMBULATORY_CARE_PROVIDER_SITE_OTHER): Payer: 59

## 2017-09-17 VITALS — BP 132/78 | HR 80 | Resp 16 | Wt 251.0 lb

## 2017-09-17 DIAGNOSIS — N950 Postmenopausal bleeding: Secondary | ICD-10-CM

## 2017-09-17 DIAGNOSIS — R87619 Unspecified abnormal cytological findings in specimens from cervix uteri: Secondary | ICD-10-CM

## 2017-09-17 MED ORDER — MEDROXYPROGESTERONE ACETATE 5 MG PO TABS: ORAL_TABLET | ORAL | 0 refills | Status: DC

## 2017-09-17 NOTE — Progress Notes (Signed)
GYNECOLOGY  VISIT   HPI: 52 y.o.   Single  African American  female   [phone] with Patient's last menstrual period was 05/19/2016 (approximate).   here for further evaluation of PMP bleeding. She had an endometrial biopsy on 08/29/17 that returned with inactive endometrium with breakdown. She had a pap that returned with atypical glandular cells (endometrial), and an [facility] that was just in the menopausal range  (down from her level 9 months ago).    GYNECOLOGIC HISTORY: Patient's last menstrual period was 05/19/2016 (approximate). Contraception:NA Menopausal hormone therapy: no        OB History    Gravida  5   Para  1   Term  1   Preterm      AB  4   Living  1     SAB  3   TAB      Ectopic  1   Multiple      Live Births  1              Patient Active Problem List   Diagnosis Date Noted  . Chronic back pain   . Special screening for malignant neoplasms, colon 08/16/2017  . Glucose intolerance 02/13/2017  . Leg swelling 02/13/2017  . Chronic constipation 11/20/2016  . Hot flashes 11/20/2016  . Tear of MCL (medial collateral ligament) of knee, left, subsequent encounter 04/06/2016  . Anterolisthesis 04/06/2016  . Situational mixed anxiety and depressive disorder 02/26/2016  . Insomnia 02/24/2016  . Back pain with radiation 02/14/2016  . Grief reaction 02/14/2016  . Essential hypertension, benign 12/26/2015  . DDD (degenerative disc disease), lumbar 12/26/2015  . Seasonal allergies 12/26/2015  . Tobacco user 04/06/2015  . Obesity 04/06/2015  . Anemia   . Asthma   . Hyperlipidemia     Past Medical History:  Diagnosis Date  . Anemia   . Anxiety   . Asthma   . Carpal tunnel syndrome   . Chronic back pain   . Depression   . History of multiple miscarriages   . Hyperlipidemia     Past Surgical History:  Procedure Laterality Date  . BREAST SURGERY  2000   reduction  . CESAREAN SECTION    . COSMETIC SURGERY  2000   breast reduction  . REDUCTION  MAMMAPLASTY Bilateral     Current Outpatient Medications  Medication Sig Dispense Refill  . Biotin 5000 MCG CAPS Take 1 capsule by mouth.    . cloNIDine (CATAPRES) 0.1 MG tablet Take 1 tablet (0.1 mg total) by mouth daily as needed. 30 tablet 1  . Cyanocobalamin (VITAMIN B 12 PO) Take 1 tablet by mouth daily.     . cyclobenzaprine (FLEXERIL) 10 MG tablet Take 1 tablet (10 mg total) by mouth 3 (three) times daily as needed for muscle spasms. 30 tablet 0  . furosemide (LASIX) 20 MG tablet Take 1 tablet (20 mg total) by mouth daily. 30 tablet 6  . gabapentin (NEURONTIN) 300 MG capsule Take 600 mg by mouth 3 (three) times daily.    . hydrOXYzine (VISTARIL) 50 MG capsule Take 1 capsule (50 mg total) by mouth 3 (three) times daily as needed. 90 capsule 2  . Lidocaine-Hydrocortisone Ace 3-0.5 % CREA Apply topically twice per day. (Patient taking differently: daily as needed. Apply topically twice per day.) 30 g 0  . linaclotide (LINZESS) 145 MCG CAPS capsule Take 1 capsule (145 mcg total) by mouth daily before breakfast. 90 capsule 1  . meloxicam (MOBIC) 7.5 MG  tablet Take 7.5 mg by mouth 2 (two) times daily.  5  . mirtazapine (REMERON) 45 MG tablet Take 1 tablet (45 mg total) by mouth at bedtime. 30 tablet 2  . nebivolol (BYSTOLIC) 10 MG tablet Take 1 tablet (10 mg total) by mouth daily. 30 tablet 6  . traMADol (ULTRAM) 50 MG tablet Take 50 mg by mouth 3 (three) times daily.     . vitamin C (ASCORBIC ACID) 500 MG tablet Take 500 mg by mouth daily.    . Vitamin D, Ergocalciferol, (DRISDOL) 50000 units CAPS capsule Take 1 capsule (50,000 Units total) by mouth every 7 (seven) days. x12 weeks. 12 capsule 0  . nicotine (NICODERM CQ) 14 mg/24hr patch Place 1 patch (14 mg total) onto the skin daily. (Patient not taking: Reported on 09/17/2017) 28 patch 0   Current Facility-Administered Medications  Medication Dose Route Frequency Provider Last Rate Last Dose  . 0.9 %  sodium chloride infusion  500 mL  Intravenous Once Armbruster, Carlota Raspberry, MD         ALLERGIES: Trazodone and nefazodone  Family History  Problem Relation Age of Onset  . Diabetes Mother   . Hypertension Mother   . Hypertension Father   . Heart disease Father   . Early death Brother   . Kidney disease Maternal Grandmother   . Heart disease Brother   . Colon cancer Neg Hx   . Rectal cancer Neg Hx     Social History   Socioeconomic History  . Marital status: Single    Spouse name: Not on file  . Number of children: 1  . Years of education: Not on file  . Highest education level: Not on file  Occupational History  . Occupation: disablity  Social Needs  . Financial resource strain: Not on file  . Food insecurity:    Worry: Not on file    Inability: Not on file  . Transportation needs:    Medical: Not on file    Non-medical: Not on file  Tobacco Use  . Smoking status: Current Every Day Smoker    Packs/day: 0.50    Years: 30.00    Pack years: 15.00    Types: Cigarettes  . Smokeless tobacco: Never Used  Substance and Sexual Activity  . Alcohol use: No  . Drug use: No  . Sexual activity: Not Currently    Partners: Male    Birth control/protection: Post-menopausal  Lifestyle  . Physical activity:    Days per week: Not on file    Minutes per session: Not on file  . Stress: Not on file  Relationships  . Social connections:    Talks on phone: Not on file    Gets together: Not on file    Attends religious service: Not on file    Active member of club or organization: Not on file    Attends meetings of clubs or organizations: Not on file    Relationship status: Not on file  . Intimate partner violence:    Fear of current or ex partner: Not on file    Emotionally abused: Not on file    Physically abused: Not on file    Forced sexual activity: Not on file  Other Topics Concern  . Not on file  Social History Narrative  . Not on file    Review of Systems  Constitutional: Negative.   HENT:  Negative.   Eyes: Negative.   Respiratory: Negative.   Cardiovascular: Negative.   Gastrointestinal:  Negative.   Genitourinary: Negative.   Musculoskeletal: Negative.   Skin: Negative.   Neurological: Negative.   Endo/Heme/Allergies: Negative.   Psychiatric/Behavioral: Negative.     PHYSICAL EXAMINATION:    BP 132/78 (BP Location: Right Arm, Patient Position: Sitting, Cuff Size: Normal)   Pulse 80   Resp 16   Wt 251 lb (113.9 kg)   LMP 05/19/2016 (Approximate)   BMI 39.31 kg/m     General appearance: alert, cooperative and appears stated age  Pelvic: External genitalia:  no lesions              Urethra:  normal appearing urethra with no masses, tenderness or lesions              Bartholins and Skenes: normal                 Vagina: normal appearing vagina with normal color and discharge, no lesions              Cervix: no lesions Endocervical curettage was done.  Sonohysterogram The procedure and risks of the procedure were reviewed with the patient, consent form was signed. A speculum was placed in the vagina and the cervix was cleansed with betadine. The sonohysterogram catheter was inserted into the uterine cavity without difficulty. Saline was infused under direct observation with the ultrasound. No intracavitary defects were noted.The catheter was removed.     Chaperone was present for exam.  ASSESSMENT PMP bleeding. Atrophic endometrium on biopsy, FSH just in the menopausal range. Suspect she is still having hormonal fluctuations. Ultrasound today with small fibroids and universally thin endometrium AGUS, endometrial pap with negative endometrial biopsy   PLAN ECC done Recommend cyclic provera until she goes 6 months without any bleeding Calendar all bleeding and f/u at the end of September Call with any concerns   An After Visit Summary was printed and given to the patient.  ~15 minutes face to face time of which over 50% was spent in counseling.   CC: Dr  Buelah Manis

## 2017-09-19 ENCOUNTER — Encounter: Payer: Self-pay | Admitting: Gastroenterology

## 2017-09-19 ENCOUNTER — Encounter: Payer: Self-pay | Admitting: Family Medicine

## 2017-09-19 ENCOUNTER — Telehealth: Payer: Self-pay | Admitting: Gastroenterology

## 2017-09-20 NOTE — Telephone Encounter (Signed)
Phone is not accepting calls. The patient has emailed also. She also contacted her PCP with the same questions.

## 2017-09-23 ENCOUNTER — Telehealth: Payer: Self-pay | Admitting: Obstetrics and Gynecology

## 2017-09-23 NOTE — Telephone Encounter (Signed)
I called the pathologist Dr Haywood Lasso to review the patient's pap and pathology reports. The patient had an AGUS-endometrial pap with negative HPV. Endometrial biopsy was inactive with breakdown and endometrial cells. Her ECC was negative. Dr Lanae Crumbly agreed, that likely the atypical cells on the pap were from the endometrial metaplasia, but there is no way she can prove that. Will have the patient return for a colposcopy.

## 2017-09-24 ENCOUNTER — Encounter: Payer: Self-pay | Admitting: Obstetrics and Gynecology

## 2017-09-25 ENCOUNTER — Telehealth: Payer: Self-pay | Admitting: Obstetrics and Gynecology

## 2017-09-25 ENCOUNTER — Encounter: Payer: Self-pay | Admitting: Obstetrics and Gynecology

## 2017-09-25 NOTE — Telephone Encounter (Signed)
She had an endometrial biopsy before, not a cervical biopsy (per records no h/o abnormal pap). The colposcopy is not typically on the same level of discomfort as an endometrial biopsy.  I see she is on daily mobic, that should help. If she still feels she needs something for the anxiety, you can have her pick up a script for ativan 1 mg po x 1 (1 hour prior to the procedure). She will need to sign the consent when she picks up the script and will need a ride to and from her appointment

## 2017-09-25 NOTE — Telephone Encounter (Signed)
Patient sent the following correspondence through [place]. Routing to triage to assist patient with request.  ----- Message from [place], Generic sent at 09/25/2017 12:56 PM EDT -----    Hi..was my ecc ok? Can u give me call jus followup

## 2017-09-25 NOTE — Telephone Encounter (Signed)
Spoke with patient. Patient states that she is very nervous about having another colposcopy. Asking if Dr.Jertson can prescribe a medication for the discomfort and her anxiety before the procedure. Advised will review with Dr.Jertson and return call.

## 2017-09-25 NOTE — Telephone Encounter (Signed)
Patient calling with questions about procedure scheduled for 09/30/17. Says she does not want another cervical biopsy.

## 2017-09-25 NOTE — Telephone Encounter (Signed)
Patient sent the following correspondence through [place]. Routing to triage to assist patient with request.  ----- Message from [place], Generic sent at 09/24/2017 4:28 PM EDT -----    Helpdesk cannot assist the database says see progress notes. My question was where is this located. For [place]? Or i can wait for phone call regardng my results  Can someone call me with results  ----- Message -----  From: Janine Limbo  Sent: 09/24/17 2:44 PM  To: [name]  Subject: RE: Visit Follow-Up Question    Hi Miss Alexus, [place] should be able to see any notes dated after 09/09/2017. If not, you may need to contact the HELP line at [phone].      Nita Sickle        ----- Message -----   From: [name]   Sent: 09/24/2017 9:54 AM EDT    To: Salvadore Dom, MD  Subject: Visit Follow-Up Question    Progressive notes? Where can i find

## 2017-09-25 NOTE — Telephone Encounter (Signed)
Spoke with patient. Results given. Patient verbalizes understanding. Colposcopy scheduled for 09/30/2017 at 2:30 pm with Dr.Jertson.  Instructions given. Motrin 800 mg po x , one hour before appointment with food. Make sure to eat a meal before appointment and drink plenty of fluids.  Patient agreeable and verbalized understanding of all instructions.   Salvadore Dom, MD  [place], Harley Hallmark, RN        Please inform the patient that her ECC was negative. She had an AGUS-endometrial pap. She had a negative endometrial biopsy (inactive endometrium with breakdown and metaplastic changes) and negative ECC. The abnormal pap is likely from the metaplastic changes, but the Pathologist says she can't be sure. The patient needs a colposcopy to r/o a cervical etiology of her abnormal pap. Please explain this to her and set it up.    Dr.Jertson please advise diagnosis code for colposcopy.

## 2017-09-26 ENCOUNTER — Telehealth: Payer: Self-pay | Admitting: Obstetrics and Gynecology

## 2017-09-26 ENCOUNTER — Encounter (HOSPITAL_COMMUNITY): Payer: Self-pay | Admitting: Licensed Clinical Social Worker

## 2017-09-26 ENCOUNTER — Ambulatory Visit (INDEPENDENT_AMBULATORY_CARE_PROVIDER_SITE_OTHER): Payer: 59 | Admitting: Licensed Clinical Social Worker

## 2017-09-26 DIAGNOSIS — R87619 Unspecified abnormal cytological findings in specimens from cervix uteri: Secondary | ICD-10-CM

## 2017-09-26 DIAGNOSIS — F4310 Post-traumatic stress disorder, unspecified: Secondary | ICD-10-CM

## 2017-09-26 NOTE — Telephone Encounter (Signed)
Call placed to convey benefits. 

## 2017-09-26 NOTE — Progress Notes (Signed)
   THERAPIST PROGRESS NOTE  Session Time: 10:10-11am  Participation Level: Active  Behavioral Response: CasualAlertDepressed  Type of Therapy: Individual Therapy  Treatment Goals addressed: Coping  Interventions: CBT  Summary: Melody Jenkins is a 52 y.o. female presents for her individual  counseling session. Pt discussed her psychiatric symptoms and current life events. Pt reports she continues to have some medical issues since her last therapy session. She is very frustrated and angry with the continued problems. Pt is becoming more agitated with all the health issues and dr visits along with her chronic pain.Asked open ended questions and used empathic reflection. Discussed cbt concepts with pt and the thought emotion connection and alternative perspectives. Suggested to pt that she take someone with her when she goes for appts and or procedures. If that is not possible take a notepad with her to write down all that is being said. Role played this with pt.      Suicidal/Homicidal: Nowithout intent/plan  Therapist Response: Assessed pt's current functioning and reviewed progress. Assisted pt processing  cbt concepts, feelings of being overwhelmed, new medical issues, using empathic reflection and asking open ended questions..Assisted pt  processing for management of stressors.  Plan: individual sessions biweekly.  Diagnosis: Axis I: MDD, recurrent severe, with psychosis        MACKENZIE,LISBETH S, LCAS  09/26/17

## 2017-09-26 NOTE — Telephone Encounter (Signed)
Spoke with patient. Advised of message as seen below from [place]. Patient verbalizes understanding. Patient does not desire rx for anxiety at this time. Feels she will be okay with current medications.   Routing to provider for final review. Patient agreeable to disposition. Will close encounter.

## 2017-09-30 ENCOUNTER — Encounter: Payer: Self-pay | Admitting: Obstetrics and Gynecology

## 2017-09-30 ENCOUNTER — Ambulatory Visit: Payer: 59 | Admitting: Obstetrics and Gynecology

## 2017-09-30 ENCOUNTER — Other Ambulatory Visit: Payer: Self-pay

## 2017-09-30 VITALS — BP 136/82 | HR 84 | Resp 16 | Ht 67.0 in | Wt 248.0 lb

## 2017-09-30 DIAGNOSIS — R87619 Unspecified abnormal cytological findings in specimens from cervix uteri: Secondary | ICD-10-CM

## 2017-09-30 DIAGNOSIS — N950 Postmenopausal bleeding: Secondary | ICD-10-CM

## 2017-09-30 DIAGNOSIS — N898 Other specified noninflammatory disorders of vagina: Secondary | ICD-10-CM | POA: Diagnosis not present

## 2017-09-30 NOTE — Patient Instructions (Signed)
Call with any questions or concerns 

## 2017-09-30 NOTE — Progress Notes (Signed)
GYNECOLOGY  VISIT   HPI: 52 y.o.   Single  African American  female   [phone] with Patient's last menstrual period was 05/19/2016 (approximate).   here for colposcopy. The patient had an AGUS, endometrial pap as part of the evaluation of PMP bleeding. She had inactive endometrium on endometrial biopsy and a negative sonohysterogram. An ECC was negative. Her FSH was just in the menopausal range. She is being treated with cyclic provera.     GYNECOLOGIC HISTORY: Patient's last menstrual period was 05/19/2016 (approximate). Contraception: post menopausal  Menopausal hormone therapy: none        OB History    Gravida  5   Para  1   Term  1   Preterm      AB  4   Living  1     SAB  3   TAB      Ectopic  1   Multiple      Live Births  1              Patient Active Problem List   Diagnosis Date Noted  . Chronic back pain   . Special screening for malignant neoplasms, colon 08/16/2017  . Glucose intolerance 02/13/2017  . Leg swelling 02/13/2017  . Chronic constipation 11/20/2016  . Hot flashes 11/20/2016  . Tear of MCL (medial collateral ligament) of knee, left, subsequent encounter 04/06/2016  . Anterolisthesis 04/06/2016  . Situational mixed anxiety and depressive disorder 02/26/2016  . Insomnia 02/24/2016  . Back pain with radiation 02/14/2016  . Grief reaction 02/14/2016  . Essential hypertension, benign 12/26/2015  . DDD (degenerative disc disease), lumbar 12/26/2015  . Seasonal allergies 12/26/2015  . Tobacco user 04/06/2015  . Obesity 04/06/2015  . Anemia   . Asthma   . Hyperlipidemia     Past Medical History:  Diagnosis Date  . Anemia   . Anxiety   . Asthma   . Carpal tunnel syndrome   . Chronic back pain   . Depression   . History of multiple miscarriages   . Hyperlipidemia     Past Surgical History:  Procedure Laterality Date  . BREAST SURGERY  2000   reduction  . CESAREAN SECTION    . COSMETIC SURGERY  2000   breast reduction  .  REDUCTION MAMMAPLASTY Bilateral     Current Outpatient Medications  Medication Sig Dispense Refill  . Biotin 5000 MCG CAPS Take 1 capsule by mouth.    . cloNIDine (CATAPRES) 0.1 MG tablet Take 1 tablet (0.1 mg total) by mouth daily as needed. 30 tablet 1  . Cyanocobalamin (VITAMIN B 12 PO) Take 1 tablet by mouth daily.     . cyclobenzaprine (FLEXERIL) 10 MG tablet Take 1 tablet (10 mg total) by mouth 3 (three) times daily as needed for muscle spasms. 30 tablet 0  . furosemide (LASIX) 20 MG tablet Take 1 tablet (20 mg total) by mouth daily. 30 tablet 6  . gabapentin (NEURONTIN) 300 MG capsule Take 600 mg by mouth 3 (three) times daily.    . hydrOXYzine (VISTARIL) 50 MG capsule Take 1 capsule (50 mg total) by mouth 3 (three) times daily as needed. 90 capsule 2  . Lidocaine-Hydrocortisone Ace 3-0.5 % CREA Apply topically twice per day. (Patient taking differently: daily as needed. Apply topically twice per day.) 30 g 0  . linaclotide (LINZESS) 145 MCG CAPS capsule Take 1 capsule (145 mcg total) by mouth daily before breakfast. 90 capsule 1  . meloxicam (MOBIC)  7.5 MG tablet Take 7.5 mg by mouth 2 (two) times daily.  5  . mirtazapine (REMERON) 45 MG tablet Take 1 tablet (45 mg total) by mouth at bedtime. 30 tablet 2  . nebivolol (BYSTOLIC) 10 MG tablet Take 1 tablet (10 mg total) by mouth daily. 30 tablet 6  . traMADol (ULTRAM) 50 MG tablet Take 50 mg by mouth 3 (three) times daily.     . vitamin C (ASCORBIC ACID) 500 MG tablet Take 500 mg by mouth daily.    . Vitamin D, Ergocalciferol, (DRISDOL) 50000 units CAPS capsule Take 1 capsule (50,000 Units total) by mouth every 7 (seven) days. x12 weeks. 12 capsule 0  . medroxyPROGESTERone (PROVERA) 5 MG tablet Take 1 tablet a day for 5 days every other month (start 10/09/17, 12/09/17, 02/09/18) (Patient not taking: Reported on 09/30/2017) 15 tablet 0  . nicotine (NICODERM CQ) 14 mg/24hr patch Place 1 patch (14 mg total) onto the skin daily. (Patient not taking:  Reported on 09/30/2017) 28 patch 0   Current Facility-Administered Medications  Medication Dose Route Frequency Provider Last Rate Last Dose  . 0.9 %  sodium chloride infusion  500 mL Intravenous Once Armbruster, Carlota Raspberry, MD         ALLERGIES: Trazodone and nefazodone  Family History  Problem Relation Age of Onset  . Diabetes Mother   . Hypertension Mother   . Hypertension Father   . Heart disease Father   . Early death Brother   . Kidney disease Maternal Grandmother   . Heart disease Brother   . Colon cancer Neg Hx   . Rectal cancer Neg Hx     Social History   Socioeconomic History  . Marital status: Single    Spouse name: Not on file  . Number of children: 1  . Years of education: Not on file  . Highest education level: Not on file  Occupational History  . Occupation: disablity  Social Needs  . Financial resource strain: Not on file  . Food insecurity:    Worry: Not on file    Inability: Not on file  . Transportation needs:    Medical: Not on file    Non-medical: Not on file  Tobacco Use  . Smoking status: Current Every Day Smoker    Packs/day: 0.50    Years: 30.00    Pack years: 15.00    Types: Cigarettes  . Smokeless tobacco: Never Used  Substance and Sexual Activity  . Alcohol use: No  . Drug use: No  . Sexual activity: Not Currently    Partners: Male    Birth control/protection: Post-menopausal  Lifestyle  . Physical activity:    Days per week: Not on file    Minutes per session: Not on file  . Stress: Not on file  Relationships  . Social connections:    Talks on phone: Not on file    Gets together: Not on file    Attends religious service: Not on file    Active member of club or organization: Not on file    Attends meetings of clubs or organizations: Not on file    Relationship status: Not on file  . Intimate partner violence:    Fear of current or ex partner: Not on file    Emotionally abused: Not on file    Physically abused: Not on file     Forced sexual activity: Not on file  Other Topics Concern  . Not on file  Social History Narrative  .  Not on file    Review of Systems  Constitutional: Negative.   HENT: Negative.   Eyes: Negative.   Respiratory: Negative.   Cardiovascular: Negative.   Gastrointestinal: Negative.   Genitourinary: Negative.   Musculoskeletal: Negative.   Skin: Negative.   Neurological: Negative.   Endo/Heme/Allergies: Negative.   Psychiatric/Behavioral: Negative.     PHYSICAL EXAMINATION:    BP 136/82 (BP Location: Right Arm, Patient Position: Sitting, Cuff Size: Large)   Pulse 84   Resp 16   Ht 5\' 7"  (1.702 m)   Wt 248 lb (112.5 kg)   LMP 05/19/2016 (Approximate)   BMI 38.84 kg/m     General appearance: alert, cooperative and appears stated age  Pelvic: External genitalia:  no lesions              Urethra:  normal appearing urethra with no masses, tenderness or lesions              Bartholins and Skenes: normal                 Vagina: normal appearing vagina with normal color, she had a large amount of a very thick, clumpy vaginal d/c              Cervix: no gross lesions  Colposcopy: unsatisfactory, no aceto-white changes noted. Generalized mild decreased uptake of lugols in the vagina (c/w atrophy), no lesions seen  Chaperone was present for exam.  ASSESSMENT AGUS, endometrial pap, negative HPV PMP bleeding. Atrophic endometrium on biopsy, FSH just in the menopausal range, fibroid uterus on ultrasound, but negative sonohysterogram. Suspect she is still having hormonal fluctuations.  Very thick, clumpy vaginal d/c. Looks c/w yeast. Patient denies symptoms   PLAN Benign ECC Colposcopy without any abnormalities She should have a f/u pap in 3/20 She has a script for cyclic provera, will take every other month and calendar any bleeding F/U here the end of September to review  Will send affirm (only need to treat if symptoms develop)   An After Visit Summary was printed and  given to the patient.   CC: Dr Buelah Manis

## 2017-10-01 ENCOUNTER — Encounter: Payer: Self-pay | Admitting: Obstetrics and Gynecology

## 2017-10-01 LAB — VAGINITIS/VAGINOSIS, DNA PROBE
Candida Species: POSITIVE — AB
Gardnerella vaginalis: POSITIVE — AB
Trichomonas vaginosis: NEGATIVE

## 2017-10-01 NOTE — Telephone Encounter (Signed)
Patient was seen for colposcopy yesterday 09/30/2017 with Dr.Jerston. Patient is requesting rx for yeast infection. Reports she has vaginal discharge with odor. On assessment yesterday patient had very thick, clumpy vaginal d/c. Routing to [place] for review.

## 2017-10-01 NOTE — Telephone Encounter (Signed)
Message   ----- Message from [place], Generic sent at 10/01/2017 9:10 AM EDT -----    Morning ..can a 1 day yeast pill be called to my pharmacy..having fishy smell or should i wait for the pap result taken yesterday. Thank u

## 2017-10-01 NOTE — Telephone Encounter (Signed)
Please see result note 

## 2017-10-02 ENCOUNTER — Ambulatory Visit (INDEPENDENT_AMBULATORY_CARE_PROVIDER_SITE_OTHER): Payer: 59 | Admitting: Licensed Clinical Social Worker

## 2017-10-02 ENCOUNTER — Encounter (HOSPITAL_COMMUNITY): Payer: Self-pay | Admitting: Licensed Clinical Social Worker

## 2017-10-02 ENCOUNTER — Encounter

## 2017-10-02 ENCOUNTER — Ambulatory Visit (HOSPITAL_COMMUNITY): Payer: Self-pay | Admitting: Licensed Clinical Social Worker

## 2017-10-02 DIAGNOSIS — F331 Major depressive disorder, recurrent, moderate: Secondary | ICD-10-CM | POA: Diagnosis not present

## 2017-10-02 MED ORDER — FLUCONAZOLE 150 MG PO TABS: 150.0000 mg | ORAL_TABLET | Freq: Once | ORAL | 0 refills | Status: AC

## 2017-10-02 MED ORDER — METRONIDAZOLE 500 MG PO TABS: 500.0000 mg | ORAL_TABLET | Freq: Two times a day (BID) | ORAL | 0 refills | Status: DC

## 2017-10-02 NOTE — Telephone Encounter (Signed)
Spoke with patient. Advised of message as seen below from [place]. Patient verbalizes understanding. Rx for Flagyl 500 mg BID x 7 days #14 0RF and Diflucan 150 mg po x1, repeat in 72 hours if symptoms persist sent to pharmacy on file. Avoid alcohol during treatment and 24 hours after completing medication. Don't mix with alcohol if mixed can cause severe nausea, vomiting and abdominal cramping. Patient verbalizes understanding. Encounter closed.  Notes recorded by Salvadore Dom, MD on 10/01/2017 at 8:59 PM EDT Please inform the patient that her vaginitis probe was + for BV and yeast. Affirm was sent secondary to vaginal d/c on exam. If symptomatic, treat with flagyl (either oral or vaginal, her choice), no ETOH while on Flagyl.  Oral: Flagyl 500 mg BID x 7 days, or Vaginal: Metrogel, 1 applicator per vagina q day x 5 days. and treat with diflucan 150 mg x 1, may repeat in 72 hours if still symptomatic. #2, no refills

## 2017-10-02 NOTE — Progress Notes (Signed)
   THERAPIST PROGRESS NOTE  Session Time: 10:10-11am  Participation Level: Active  Behavioral Response: CasualAlertDepressed  Type of Therapy: Individual Therapy  Treatment Goals addressed: Coping  Interventions: CBT  Summary: Melody Jenkins is a 52 y.o. female presents for her individual  counseling session. Pt discussed her psychiatric symptoms and current life events. Pt reports she continues to have some medical issues. Asked open ended questions. Pt reports her depressive symptoms have increased due to the summer coming, when she fell 2 years ago and the loss of her fiance. Discussed pt's depressive symptoms with her. Pt was tearful during this encounter, used empathic reflection. Pt wants to move on with her life but feels stuck with so many health issues, chronic pain. Discussed strategies of how she can move forward with her life.          Suicidal/Homicidal: Nowithout intent/plan  Therapist Response: Assessed pt's current functioning and reviewed progress. Assisted pt processing  Loss of health and fiance, chronic pain, depressive symptoms, used empathic reflection and asking open ended questions..Assisted pt  processing for management of stressors.  Plan: individual sessions biweekly.  Diagnosis: Axis I: MDD, recurrent episode moderate        Wilkes Potvin S, LCAS  10/02/17

## 2017-10-06 ENCOUNTER — Other Ambulatory Visit: Payer: Self-pay | Admitting: Family Medicine

## 2017-10-14 ENCOUNTER — Ambulatory Visit (INDEPENDENT_AMBULATORY_CARE_PROVIDER_SITE_OTHER): Payer: 59 | Admitting: Licensed Clinical Social Worker

## 2017-10-14 ENCOUNTER — Encounter

## 2017-10-14 ENCOUNTER — Encounter (HOSPITAL_COMMUNITY): Payer: Self-pay | Admitting: Licensed Clinical Social Worker

## 2017-10-14 DIAGNOSIS — F331 Major depressive disorder, recurrent, moderate: Secondary | ICD-10-CM

## 2017-10-14 NOTE — Progress Notes (Signed)
   THERAPIST PROGRESS NOTE  Session Time: 10:10-11am  Participation Level: Active  Behavioral Response: Casual/Alert/Agitated  Type of Therapy: Individual Therapy  Treatment Goals addressed: Coping  Interventions: CBT  Summary: Melody Jenkins is a 52 y.o. female presents for her individual  counseling session. Pt discussed her psychiatric symptoms and current life events. Pt reports she continues with her ongoing chronic pain along with the repercussions of chronic use of pain medications. Asked open ended questions. Pt's daughter continues to live with her to help her with her chronic back pain. Her daughter has become less and less helpful and has developed an attitude when speaking to her mother. Pt is angry  With her daughter and just wants her to move out. Discussed with pt the benefits of her daughter living with pt. Pt sees the benefits but it's frustrating for pt. Discussed low frustration tolerance with pt and gave her alternative skills for dealing with the low frustration tolerance.  Continue discussions with pt for strategies to move forward in her life.           Suicidal/Homicidal: Nowithout intent/plan  Therapist Response: Assessed pt's current functioning and reviewed progress. Assisted pt processing  Relationships, benefits of having in home help,  chronic pain, depressive symptoms, asked open ended questions..Assisted pt  processing for management of stressors.  Plan: individual sessions biweekly.  Diagnosis: Axis I: MDD, recurrent episode moderate        Keishawn Darsey S, LCAS  10/14/17

## 2017-10-17 ENCOUNTER — Ambulatory Visit (INDEPENDENT_AMBULATORY_CARE_PROVIDER_SITE_OTHER): Payer: 59 | Admitting: Psychiatry

## 2017-10-17 ENCOUNTER — Encounter (HOSPITAL_COMMUNITY): Payer: Self-pay | Admitting: Psychiatry

## 2017-10-17 DIAGNOSIS — Z634 Disappearance and death of family member: Secondary | ICD-10-CM | POA: Diagnosis not present

## 2017-10-17 DIAGNOSIS — F17210 Nicotine dependence, cigarettes, uncomplicated: Secondary | ICD-10-CM

## 2017-10-17 DIAGNOSIS — R450 Nervousness: Secondary | ICD-10-CM | POA: Diagnosis not present

## 2017-10-17 DIAGNOSIS — M549 Dorsalgia, unspecified: Secondary | ICD-10-CM | POA: Diagnosis not present

## 2017-10-17 DIAGNOSIS — F411 Generalized anxiety disorder: Secondary | ICD-10-CM | POA: Diagnosis not present

## 2017-10-17 DIAGNOSIS — F4310 Post-traumatic stress disorder, unspecified: Secondary | ICD-10-CM

## 2017-10-17 DIAGNOSIS — G4701 Insomnia due to medical condition: Secondary | ICD-10-CM

## 2017-10-17 DIAGNOSIS — M2550 Pain in unspecified joint: Secondary | ICD-10-CM

## 2017-10-17 DIAGNOSIS — F333 Major depressive disorder, recurrent, severe with psychotic symptoms: Secondary | ICD-10-CM

## 2017-10-17 MED ORDER — MIRTAZAPINE 45 MG PO TABS: 45.0000 mg | ORAL_TABLET | Freq: Every day | ORAL | 2 refills | Status: DC

## 2017-10-17 MED ORDER — HYDROXYZINE PAMOATE 50 MG PO CAPS
50.0000 mg | ORAL_CAPSULE | Freq: Three times a day (TID) | ORAL | 2 refills | Status: DC | PRN
Start: 2017-10-17 — End: 2018-01-23

## 2017-10-17 MED ORDER — CLONIDINE HCL 0.1 MG PO TABS: 0.1000 mg | ORAL_TABLET | Freq: Two times a day (BID) | ORAL | 2 refills | Status: DC | PRN

## 2017-10-17 NOTE — Progress Notes (Signed)
[facility] MD/PA/NP OP Progress Note  10/17/2017 10:58 AM Melody Jenkins  MRN:  [id number]  Chief Complaint:  Chief Complaint    Follow-up     HPI: "Doing ok. They anxiety pills are good. They calm me down real quick when I take them". Her doctor has increased her Tramadol trying to "get in front of the pain" last week. She will follow up with him in another 2 months. Lately she has been feeling down as the anniversary of fall in 01/03/2023 and her husband's death in 2023-03-05. Her daughter has moved in with her and it helps. Pt finds herself hiding her feelings from her daughter. Pt is "just trying to live" but she can't get used to the pain and it never goes away. She is having trouble with her memory and is using some techniques she learned in therapy. She feels she has lost her independence after the fall. Sleep is variable. She has been having some bad dreams lately and thinks it is due to the upcoming anniversaries. Some nights she sleeps really well. She is depressed due to the pain. Pt denies SI/HI but just wants relief- "I don't have those thoughts anymore".  She is trying to come up with fun things to do to distract herself. Pt cries a lot when her daughter leaves but has not shared her feelings with her daughter. Pt is mad and upset that she got hurt after the fall. All her plans were "just gone" after that. "I feel like I have no life". Pt is taking meds as prescribed and denies SE.   Pt has decreased cigarette smoking to 3 a day. She is very proud of her effort. She is not using the patch.   Visit Diagnosis:    ICD-10-CM   1. MDD (major depressive disorder), recurrent, severe, with psychosis ([place]) F33.3 mirtazapine (REMERON) 45 MG tablet  2. Anxiety state F41.1 cloNIDine (CATAPRES) 0.1 MG tablet  3. Insomnia due to medical condition G47.01 mirtazapine (REMERON) 45 MG tablet  4. PTSD (post-traumatic stress disorder) F43.10 hydrOXYzine (VISTARIL) 50 MG capsule      Past Psychiatric History:   Patient endorse history of depression 20 years ago when she was given Lexapro by her primary care physician. She remember a good response from the Lexapro. Patient denies any history of psychiatric inpatient treatment, psychosis, suicidal attempt, mania, hallucination, OCD symptoms.   Previous Psychotropic Medications:Lexapro, Effexor, Vistaril-ineffective; Pristiq- helpful but increased BP  Substance Abuse History in the last 12 months: No.  Consequences of Substance Abuse: Patient endorse history of drinking alcohol on the weekends and get some time intoxicated but denies any history of seizures, tremors, blackouts or any withdrawal symptoms.    Past Medical History:  Past Medical History:  Diagnosis Date  . Anemia   . Anxiety   . Asthma   . Carpal tunnel syndrome   . Chronic back pain   . Depression   . History of multiple miscarriages   . Hyperlipidemia     Past Surgical History:  Procedure Laterality Date  . BREAST SURGERY  2000   reduction  . CESAREAN SECTION    . COSMETIC SURGERY  2000   breast reduction  . REDUCTION MAMMAPLASTY Bilateral     Family Psychiatric History:  Family History  Problem Relation Age of Onset  . Diabetes Mother   . Hypertension Mother   . Hypertension Father   . Heart disease Father   . Early death Brother   . Kidney disease Maternal  Grandmother   . Heart disease Brother   . Colon cancer Neg Hx   . Rectal cancer Neg Hx     Social History:  Social History   Socioeconomic History  . Marital status: Single    Spouse name: Not on file  . Number of children: 1  . Years of education: Not on file  . Highest education level: Not on file  Occupational History  . Occupation: disablity  Social Needs  . Financial resource strain: Very hard  . Food insecurity:    Worry: Never true    Inability: Never true  . Transportation needs:    Medical: No    Non-medical: No  Tobacco Use  . Smoking status: Current Every Day Smoker     Packs/day: 0.25    Years: 30.00    Pack years: 7.50    Types: Cigarettes  . Smokeless tobacco: Never Used  Substance and Sexual Activity  . Alcohol use: No  . Drug use: No  . Sexual activity: Not Currently    Partners: Male    Birth control/protection: Post-menopausal  Lifestyle  . Physical activity:    Days per week: 0 days    Minutes per session: 0 min  . Stress: Rather much  Relationships  . Social connections:    Talks on phone: More than three times a week    Gets together: Once a week    Attends religious service: Never    Active member of club or organization: No    Attends meetings of clubs or organizations: Never    Relationship status: Never married  Other Topics Concern  . Not on file  Social History Narrative  . Not on file    Allergies:  Allergies  Allergen Reactions  . Trazodone And Nefazodone Hives    Metabolic Disorder Labs: Lab Results  Component Value Date   HGBA1C 6.3 (H) 09/13/2017   MPG 134 09/13/2017   MPG 126 05/15/2017   No results found for: PROLACTIN Lab Results  Component Value Date   CHOL 199 09/13/2017   TRIG 146 09/13/2017   HDL 35 (L) 09/13/2017   CHOLHDL 5.7 (H) 09/13/2017   VLDL 29 03/05/2016   LDLCALC 136 (H) 09/13/2017   LDLCALC 136 (H) 05/15/2017   Lab Results  Component Value Date   TSH 0.83 11/07/2016   TSH 0.59 03/05/2016    Therapeutic Level Labs: No results found for: LITHIUM No results found for: VALPROATE No components found for:  CBMZ  Current Medications: Current Outpatient Medications  Medication Sig Dispense Refill  . Biotin 5000 MCG CAPS Take 1 capsule by mouth.    . cloNIDine (CATAPRES) 0.1 MG tablet Take 1 tablet (0.1 mg total) by mouth 2 (two) times daily as needed. 60 tablet 2  . Cyanocobalamin (VITAMIN B 12 PO) Take 1 tablet by mouth daily.     . cyclobenzaprine (FLEXERIL) 10 MG tablet Take 1 tablet (10 mg total) by mouth 3 (three) times daily as needed for muscle spasms. 30 tablet 0  .  furosemide (LASIX) 20 MG tablet Take 1 tablet (20 mg total) by mouth daily. 30 tablet 6  . gabapentin (NEURONTIN) 300 MG capsule Take 600 mg by mouth 3 (three) times daily.    . hydrOXYzine (VISTARIL) 50 MG capsule Take 1 capsule (50 mg total) by mouth 3 (three) times daily as needed. 90 capsule 2  . Lidocaine-Hydrocortisone Ace 3-0.5 % CREA Apply topically twice per day. (Patient taking differently: daily as needed. Apply topically  twice per day.) 30 g 0  . linaclotide (LINZESS) 145 MCG CAPS capsule Take 1 capsule (145 mcg total) by mouth daily before breakfast. 90 capsule 1  . medroxyPROGESTERone (PROVERA) 5 MG tablet Take 1 tablet a day for 5 days every other month (start 10/09/17, 12/09/17, 02/09/18) 15 tablet 0  . meloxicam (MOBIC) 7.5 MG tablet Take 7.5 mg by mouth 2 (two) times daily.  5  . mirtazapine (REMERON) 45 MG tablet Take 1 tablet (45 mg total) by mouth at bedtime. 30 tablet 2  . nebivolol (BYSTOLIC) 10 MG tablet Take 1 tablet (10 mg total) by mouth daily. 30 tablet 6  . traMADol (ULTRAM) 50 MG tablet Take 50 mg by mouth 4 (four) times daily.     . vitamin C (ASCORBIC ACID) 500 MG tablet Take 500 mg by mouth daily.    . Vitamin D, Ergocalciferol, (DRISDOL) 50000 units CAPS capsule TAKE 1 CAPSULE (50,000 UNITS TOTAL) BY MOUTH EVERY 7 (SEVEN) DAYS. X12 WEEKS. 12 capsule 0  . metroNIDAZOLE (FLAGYL) 500 MG tablet Take 1 tablet (500 mg total) by mouth 2 (two) times daily. (Patient not taking: Reported on 10/17/2017) 14 tablet 0  . nicotine (NICODERM CQ) 14 mg/24hr patch Place 1 patch (14 mg total) onto the skin daily. (Patient not taking: Reported on 09/30/2017) 28 patch 0   Current Facility-Administered Medications  Medication Dose Route Frequency Provider Last Rate Last Dose  . 0.9 %  sodium chloride infusion  500 mL Intravenous Once Armbruster, Carlota Raspberry, MD         Musculoskeletal: Strength & Muscle Tone: within normal limits Gait & Station: broad based, walking with cane Patient leans:  N/A  Psychiatric Specialty Exam: Review of Systems  Musculoskeletal: Positive for back pain, joint pain and myalgias. Negative for falls.  Psychiatric/Behavioral: Positive for depression. The patient is nervous/anxious. The patient does not have insomnia.     Last menstrual period 05/19/2016.There is no height or weight on file to calculate BMI.  General Appearance: Fairly Groomed  Eye Contact:  Good  Speech:  Clear and Coherent and Normal Rate  Volume:  Normal  Mood:  Anxious and Depressed  Affect:  Congruent  Thought Process:  Coherent and Descriptions of Associations: Intact  Orientation:  Full (Time, Place, and Person)  Thought Content: Rumination   Suicidal Thoughts:  No  Homicidal Thoughts:  No  Memory:  Immediate;   Poor Recent;   Poor Remote;   Good  Judgement:  Fair  Insight:  Fair  Psychomotor Activity:  Normal  Concentration:  Concentration: Good and Attention Span: Good  Recall:  Good  Fund of Knowledge: Good  Language: Good  Akathisia:  No  Handed:  Right  AIMS (if indicated): not done  Assets:  Communication Skills Desire for Improvement Housing Social Support  ADL's:  Intact  Cognition: WNL  Sleep:  variable   Screenings: GAD-7     Office Visit from 02/06/2016 in [place]  Total GAD-7 Score  21    PHQ2-9     Office Visit from 09/13/2017 in [place] Office Visit from 08/06/2017 in [place] Office Visit from 05/15/2017 in [place] Office Visit from 02/13/2017 in [place] Office Visit from 11/20/2016 in [place]  PHQ-2 Total Score  0  5  5  4  4   PHQ-9 Total Score  0  18  21  18   22  Assessment and Plan: MDD with anxiety, recurrent, severe without psychotic features; PTSD; insomnia    Medication management with supportive therapy. Risks and benefits, side effects and alternative treatment options discussed with patient. Pt was given  an opportunity to ask questions about medication, illness, and treatment. All current psychiatric medications have been reviewed and discussed with the patient and adjusted as clinically appropriate. The patient has been provided an accurate and updated list of the medications being now prescribed. Patient expressed understanding of how their medications were to be used.  Pt verbalized understanding and verbal consent obtained for treatment.  The risk of un-intended pregnancy is low based on the fact that pt reports she is postmenopausal. Pt is aware that these meds carry a teratogenic risk. Pt will discuss plan of action if she does or plans to become pregnant in the future.  Status of current problems: ongoing  Meds: Vistaril 50 mg p.o. 3 times daily as needed anxiety as related to depression and PTSD Remeron 45 mg p.o. nightly for MDD, PTSD and insomnia Increase Clonidine 0.1 mg p.o. BID as needed anxiety- pt denies SE Patient is on Bystolic and Neurontin as prescribed by PCP both have been off label indication for anxiety   Labs: 09/13/2017 shows glu 154, LDL elevated, HbA1c 6.3  Therapy: brief supportive therapy provided. Discussed psychosocial stressors in detail.   Encouraged pt to develop daily routine and work on daily goal setting as a way to improve mood symptoms.   Consultations: Encouraged to follow up with therapist Encouraged to follow up with PCP as needed  Pt denies SI and is at an acute low risk for suicide. Patient told to call clinic if any problems occur. Patient advised to go to ER if they should develop SI/HI, side effects, or if symptoms worsen. Has crisis numbers to call if needed. Pt verbalized understanding.  F/up in 2 months or sooner if needed    Charlcie Cradle, MD 10/17/2017, 10:58 AM

## 2017-11-11 ENCOUNTER — Ambulatory Visit (INDEPENDENT_AMBULATORY_CARE_PROVIDER_SITE_OTHER): Payer: 59 | Admitting: Gastroenterology

## 2017-11-11 ENCOUNTER — Encounter: Payer: Self-pay | Admitting: Gastroenterology

## 2017-11-11 VITALS — BP 140/80 | HR 76 | Ht 67.25 in | Wt 248.1 lb

## 2017-11-11 DIAGNOSIS — K641 Second degree hemorrhoids: Secondary | ICD-10-CM | POA: Diagnosis not present

## 2017-11-11 DIAGNOSIS — K5900 Constipation, unspecified: Secondary | ICD-10-CM

## 2017-11-11 MED ORDER — LINACLOTIDE 290 MCG PO CAPS: 290.0000 ug | ORAL_CAPSULE | Freq: Every day | ORAL | 0 refills | Status: DC

## 2017-11-11 NOTE — Progress Notes (Signed)
52 y/o female here for therapy for internal hemorrhoids. She has had symptoms of irritation / bleeding, prolapse in the past. Recent colonoscopy did not show any other pathology to cause this. She is on Linzess 110mcg, states she did better for constipation at higher dosing of 249mcg   PROCEDURE NOTE: The patient presents with symptomatic grade II  hemorrhoids, requesting rubber band ligation of his/her hemorrhoidal disease.  All risks, benefits and alternative forms of therapy were described and informed consent was obtained.  In the Left Lateral Decubitus position anoscopic examination revealed grade II hemorrhoids in all positions but most prominent in RP areas.  The anorectum was pre-medicated with 0.125% nitroglycerin The decision was made to band the RP internal hemorrhoid, and the [place] was used to perform band ligation without complication.  Digital anorectal examination was then performed to assure proper positioning of the band, and to adjust the banded tissue as required.  The patient was discharged home without pain or other issues.  Dietary and behavioral recommendations were given and along with follow-up instructions.     The following adjunctive treatments were recommended: Increase Linzess to 24mcg / day, minimize straining  The patient will return in 2-4 weeks for  follow-up and possible additional banding as required. No complications were encountered and the patient tolerated the procedure well.   Cellar, MD [facility]

## 2017-11-11 NOTE — Patient Instructions (Signed)
If you are age 53 or older, your body mass index should be between 23-30. Your Body mass index is 38.57 kg/m. If this is out of the aforementioned range listed, please consider follow up with your Primary Care Provider.  If you are age 24 or younger, your body mass index should be between 19-25. Your Body mass index is 38.57 kg/m. If this is out of the aformentioned range listed, please consider follow up with your Primary Care Provider.   We have given you samples of the following medication to take:  Linzess 290 mcg: Take once a day  HEMORRHOID BANDING PROCEDURE    FOLLOW-UP CARE   1. The procedure you have had should have been relatively painless since the banding of the area involved does not have nerve endings and there is no pain sensation.  The rubber band cuts off the blood supply to the hemorrhoid and the band may fall off as soon as 48 hours after the banding (the band may occasionally be seen in the toilet bowl following a bowel movement). You may notice a temporary feeling of fullness in the rectum which should respond adequately to plain Tylenol or Motrin.  2. Following the banding, avoid strenuous exercise that evening and resume full activity the next day.  A sitz bath (soaking in a warm tub) or bidet is soothing, and can be useful for cleansing the area after bowel movements.     3. To avoid constipation, take two tablespoons of natural wheat bran, natural oat bran, flax, Benefiber or any over the counter fiber supplement and increase your water intake to 7-8 glasses daily.    4. Unless you have been prescribed anorectal medication, do not put anything inside your rectum for two weeks: No suppositories, enemas, fingers, etc.  5. Occasionally, you may have more bleeding than usual after the banding procedure.  This is often from the untreated hemorrhoids rather than the treated one.  Don't be concerned if there is a tablespoon or so of blood.  If there is more blood than  this, lie flat with your bottom higher than your head and apply an ice pack to the area. If the bleeding does not stop within a half an hour or if you feel faint, call our office at [phone] or go to the emergency room.  6. Problems are not common; however, if there is a substantial amount of bleeding, severe pain, chills, fever or difficulty passing urine (very rare) or other problems, you should call us at [phone] or report to the nearest emergency room.  7. Do not stay seated continuously for more than 2-3 hours for a day or two after the procedure.  Tighten your buttock muscles 10-15 times every two hours and take 10-15 deep breaths every 1-2 hours.  Do not spend more than a few minutes on the toilet if you cannot empty your bowel; instead re-visit the toilet at a later time.    Thank you for entrusting me with your care and for choosing [facility], Dr. [place] Cellar

## 2017-11-12 ENCOUNTER — Ambulatory Visit (HOSPITAL_COMMUNITY): Payer: Self-pay | Admitting: Licensed Clinical Social Worker

## 2017-11-12 ENCOUNTER — Encounter

## 2017-11-21 ENCOUNTER — Encounter: Payer: Self-pay | Admitting: Family Medicine

## 2017-11-27 ENCOUNTER — Encounter

## 2017-11-27 ENCOUNTER — Encounter (HOSPITAL_COMMUNITY): Payer: Self-pay | Admitting: Licensed Clinical Social Worker

## 2017-11-27 ENCOUNTER — Ambulatory Visit (INDEPENDENT_AMBULATORY_CARE_PROVIDER_SITE_OTHER): Payer: 59 | Admitting: Licensed Clinical Social Worker

## 2017-11-27 DIAGNOSIS — F4310 Post-traumatic stress disorder, unspecified: Secondary | ICD-10-CM | POA: Diagnosis not present

## 2017-11-27 DIAGNOSIS — F331 Major depressive disorder, recurrent, moderate: Secondary | ICD-10-CM | POA: Diagnosis not present

## 2017-11-27 NOTE — Progress Notes (Signed)
Comprehensive Clinical Assessment (CCA) Note  11/27/2017 Melody Jenkins [id number]  Visit Diagnosis:      ICD-10-CM   1. PTSD (post-traumatic stress disorder) F43.10   2. MDD (major depressive disorder), recurrent episode, moderate ([place]) F33.1       CCA Part One  Part One has been completed on paper by the patient.  (See scanned document in Chart Review)  CCA Part Two A  Intake/Chief Complaint:  CCA Intake With Chief Complaint CCA Part Two Date: (P) 11/27/17 CCA Part Two Time: (P) 1103 Chief Complaint/Presenting Problem: (P) Pt continues with therapy due to her anxiety and depression due to chronic pain, grief from loss of fiance Patients Currently Reported Symptoms/Problems: (P) depression, anxiety, loss of sleep, pain, grief Collateral Involvement: (P) Epic notes Individual's Strengths: (P) strong woman, employed, financially secure Individual's Preferences: (P) prefers to have her fiance back Individual's Abilities: (P) ability to move forward with her life Type of Services Patient Feels Are Needed: (P) individual therapy  Mental Health Symptoms Depression:  Depression: (P) Change in energy/activity, Difficulty Concentrating, Fatigue, Irritability, Tearfulness, Sleep (too much or little)  Mania:  Mania: (P) N/A  Anxiety:   Anxiety: (P) Fatigue, Irritability, Restlessness, Tension, Worrying  Psychosis:  Psychosis: (P) N/A  Trauma:  Trauma: (P) Avoids reminders of event, Hypervigilance, Irritability/anger, Detachment from others  Obsessions:  Obsessions: (P) N/A  Compulsions:  Compulsions: (P) N/A  Inattention:  Inattention: (P) N/A  Hyperactivity/Impulsivity:  Hyperactivity/Impulsivity: (P) N/A  Oppositional/Defiant Behaviors:  Oppositional/Defiant Behaviors: (P) N/A  Borderline Personality:  Emotional Irregularity: (P) N/A  Other Mood/Personality Symptoms:  Other Mood/Personality Symtpoms: (P) most symptoms are due to her chronic pain and grief   Mental Status  Exam Appearance and self-care  Stature:  Stature: (P) Average  Weight:  Weight: (P) Average weight  Clothing:  Clothing: (P) Casual  Grooming:  Grooming: (P) Normal  Cosmetic use:  Cosmetic Use: (P) Age appropriate  Posture/gait:  Posture/Gait: (P) Normal  Motor activity:  Motor Activity: (P) Restless  Sensorium  Attention:  Attention: (P) Distractible  Concentration:     Orientation:     Recall/memory:     Affect and Mood  Affect:     Mood:     Relating  Eye contact:     Facial expression:     Attitude toward examiner:     Thought and Language  Speech flow:    Thought content:     Preoccupation:     Hallucinations:     Organization:     Transport planner of Knowledge:     Intelligence:     Abstraction:     Judgement:     Reality Testing:     Insight:     Decision Making:     Social Functioning  Social Maturity:  Social Maturity: Responsible  Social Judgement:  Social Judgement: Normal  Stress  Stressors:  Stressors: Grief/losses, Illness  Coping Ability:  Coping Ability: Research officer, political party Deficits:     Supports:      Family and Psychosocial History:    Childhood History:     CCA Part Two B  Employment/Work Situation: Employment / Work Situation Employment situation: Leave of absence Why is patient on disability: back pain How long has patient been on disability: has pending disability case and personal injury case Patient's job has been impacted by current illness: Yes Describe how patient's job has been impacted: pending disability hearing What is the longest time patient has a held a job?:  20 years at [employer] Did You Receive Any Psychiatric Treatment/Services While in the Military?: No Are There Guns or Other Weapons in [place]?: No  Education: Education Last Grade Completed: 16 Did Teacher, 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 Did [place]?: No Did You Attend Graduate School?: No Did You Have An Individualized Education Program (IIEP):  No Did You Have Any Difficulty At School?: No  Religion: Religion/Spirituality Are You A Religious Person?: Yes What is Your Religious Affiliation?: Personal assistant: Leisure / Recreation Leisure and Hobbies: play with dogs  Exercise/Diet: Exercise/Diet Do You Exercise?: No Have You Gained or Lost A Significant Amount of Weight in the Past Six Months?: No Do You Follow a Special Diet?: No Do You Have Any Trouble Sleeping?: No Explanation of Sleeping Difficulties: doesn't sleep through the night  CCA Part Two C  Alcohol/Drug Use: Alcohol / Drug Use History of alcohol / drug use?: No history of alcohol / drug abuse                      CCA Part Three  ASAM's:  Six Dimensions of Multidimensional Assessment  Dimension 1:  Acute Intoxication and/or Withdrawal Potential:     Dimension 2:  Biomedical Conditions and Complications:     Dimension 3:  Emotional, Behavioral, or Cognitive Conditions and Complications:     Dimension 4:  Readiness to Change:     Dimension 5:  Relapse, Continued use, or Continued Problem Potential:     Dimension 6:  Recovery/Living Environment:      Substance use Disorder (SUD)    Social Function:  Social Functioning Social Maturity: Responsible Social Judgement: Normal  Stress:  Stress Stressors: Grief/losses, Illness Coping Ability: Exhausted Patient Takes Medications The Way The Doctor Instructed?: Yes Priority Risk: Low Acuity  Risk Assessment- Self-Harm Potential: Risk Assessment For Self-Harm Potential Thoughts of Self-Harm: No current thoughts Method: No plan Availability of Means: No access/NA  Risk Assessment -Dangerous to Others Potential: Risk Assessment For Dangerous to Others Potential Method: No Plan Availability of Means: No access or NA Intent: Vague intent or NA Notification Required: No need or identified person  DSM5 Diagnoses: Patient Active Problem List   Diagnosis Date Noted  . Chronic back  pain   . Special screening for malignant neoplasms, colon 08/16/2017  . Glucose intolerance 02/13/2017  . Leg swelling 02/13/2017  . Chronic constipation 11/20/2016  . Hot flashes 11/20/2016  . Tear of MCL (medial collateral ligament) of knee, left, subsequent encounter 04/06/2016  . Anterolisthesis 04/06/2016  . Situational mixed anxiety and depressive disorder 02/26/2016  . Insomnia 02/24/2016  . Back pain with radiation 02/14/2016  . Grief reaction 02/14/2016  . Essential hypertension, benign 12/26/2015  . DDD (degenerative disc disease), lumbar 12/26/2015  . Seasonal allergies 12/26/2015  . Tobacco user 04/06/2015  . Obesity 04/06/2015  . Anemia   . Asthma   . Hyperlipidemia     Patient Centered Plan: Patient is on the following Treatment Plan(s):  PTSD  Recommendations for Services/Supports/Treatments: Recommendations for Services/Supports/Treatments Recommendations For Services/Supports/Treatments: Individual Therapy, Medication Management  Treatment Plan Summary: OP Treatment Plan Summary: I want to feel less chronic pain, work through my grief of losing my fiance, my job, my life, finances  Referrals to Alternative Service(s): Referred to Alternative Service(s):   Place:   Date:   Time:    Referred to Alternative Service(s):   Place:   Date:   Time:    Referred to Alternative Service(s):  Place:   Date:   Time:    Referred to Alternative Service(s):   Place:   Date:   Time:     [name]

## 2017-11-28 ENCOUNTER — Ambulatory Visit (INDEPENDENT_AMBULATORY_CARE_PROVIDER_SITE_OTHER): Payer: 59 | Admitting: Gastroenterology

## 2017-11-28 ENCOUNTER — Encounter: Payer: Self-pay | Admitting: Gastroenterology

## 2017-11-28 VITALS — BP 142/78 | HR 72 | Ht 67.25 in | Wt 252.0 lb

## 2017-11-28 DIAGNOSIS — K641 Second degree hemorrhoids: Secondary | ICD-10-CM

## 2017-11-28 MED ORDER — LINACLOTIDE 290 MCG PO CAPS: 290.0000 ug | ORAL_CAPSULE | Freq: Every day | ORAL | 0 refills | Status: DC

## 2017-11-28 NOTE — Progress Notes (Signed)
Patient is here for follow up. Had RP hemorrhoid banded on 11/11/2017. She had some discomfort for one day after the procedure but then resolved. Overall she reported significant improvement in hemorrhoid symptoms since it was done. She wishes to proceed with further banding today.  For constipation previously given trial of 284mcg Linzess which she states helped compared to 165mcg. She is taking Miralax sporadically, overall seems improved.   PROCEDURE NOTE: The patient presents with symptomatic grade II hemorrhoids, requesting rubber band ligation of his/her hemorrhoidal disease.  All risks, benefits and alternative forms of therapy were described and informed consent was obtained.  The anorectum was pre-medicated with 0.125% nitroglycerin The decision was made to band the LL internal hemorrhoid, and the [place] was used to perform band ligation without complication.  Digital anorectal examination was then performed to assure proper positioning of the band, and to adjust the banded tissue as required.  The patient was discharged home without pain or other issues.  Dietary and behavioral recommendations were given and along with follow-up instructions.     The following adjunctive treatments were recommended: Continue Linzess 269mcg  The patient will return in 2-4 weeks for  follow-up and possible additional banding as required. No complications were encountered and the patient tolerated the procedure well.  Healy Cellar, MD [facility]

## 2017-11-28 NOTE — Patient Instructions (Addendum)
If you are age 53 or older, your body mass index should be between 23-30. Your Body mass index is 39.18 kg/m. If this is out of the aforementioned range listed, please consider follow up with your Primary Care Provider.  If you are age 32 or younger, your body mass index should be between 19-25. Your Body mass index is 39.18 kg/m. If this is out of the aformentioned range listed, please consider follow up with your Primary Care Provider.   We have given you samples of the following medication to take: Linzess 225mcg  [place]   1. The procedure you have had should have been relatively painless since the banding of the area involved does not have nerve endings and there is no pain sensation.  The rubber band cuts off the blood supply to the hemorrhoid and the band may fall off as soon as 48 hours after the banding (the band may occasionally be seen in the toilet bowl following a bowel movement). You may notice a temporary feeling of fullness in the rectum which should respond adequately to plain Tylenol or Motrin.  2. Following the banding, avoid strenuous exercise that evening and resume full activity the next day.  A sitz bath (soaking in a warm tub) or bidet is soothing, and can be useful for cleansing the area after bowel movements.     3. To avoid constipation, take two tablespoons of natural wheat bran, natural oat bran, flax, Benefiber or any over the counter fiber supplement and increase your water intake to 7-8 glasses daily.    4. Unless you have been prescribed anorectal medication, do not put anything inside your rectum for two weeks: No suppositories, enemas, fingers, etc.  5. Occasionally, you may have more bleeding than usual after the banding procedure.  This is often from the untreated hemorrhoids rather than the treated one.  Don't be concerned if there is a tablespoon or so of blood.  If there is more blood than this, lie flat with  your bottom higher than your head and apply an ice pack to the area. If the bleeding does not stop within a half an hour or if you feel faint, call our office at [phone] or go to the emergency room.  6. Problems are not common; however, if there is a substantial amount of bleeding, severe pain, chills, fever or difficulty passing urine (very rare) or other problems, you should call us at [phone] or report to the nearest emergency room.  7. Do not stay seated continuously for more than 2-3 hours for a day or two after the procedure.  Tighten your buttock muscles 10-15 times every two hours and take 10-15 deep breaths every 1-2 hours.  Do not spend more than a few minutes on the toilet if you cannot empty your bowel; instead re-visit the toilet at a later time.    Thank you for entrusting me with your care and for choosing [facility], Dr. [place]lett Cellar

## 2017-12-10 ENCOUNTER — Encounter (HOSPITAL_COMMUNITY): Payer: Self-pay | Admitting: Licensed Clinical Social Worker

## 2017-12-10 ENCOUNTER — Ambulatory Visit (INDEPENDENT_AMBULATORY_CARE_PROVIDER_SITE_OTHER): Payer: 59 | Admitting: Licensed Clinical Social Worker

## 2017-12-10 DIAGNOSIS — F333 Major depressive disorder, recurrent, severe with psychotic symptoms: Secondary | ICD-10-CM | POA: Diagnosis not present
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
## 2017-12-13 ENCOUNTER — Ambulatory Visit (INDEPENDENT_AMBULATORY_CARE_PROVIDER_SITE_OTHER): Payer: 59 | Admitting: Gastroenterology

## 2017-12-13 ENCOUNTER — Other Ambulatory Visit: Payer: Self-pay | Admitting: Family Medicine

## 2017-12-13 VITALS — BP 136/66 | HR 70 | Ht 67.25 in | Wt 251.0 lb

## 2017-12-13 DIAGNOSIS — Z1231 Encounter for screening mammogram for malignant neoplasm of breast: Secondary | ICD-10-CM

## 2017-12-13 DIAGNOSIS — K648 Other hemorrhoids: Secondary | ICD-10-CM

## 2017-12-13 DIAGNOSIS — K5909 Other constipation: Secondary | ICD-10-CM

## 2017-12-13 MED ORDER — PRUCALOPRIDE SUCCINATE 2 MG PO TABS: 2.0000 mg | ORAL_TABLET | Freq: Every day | ORAL | 0 refills | Status: DC

## 2017-12-13 NOTE — Patient Instructions (Signed)
If you are age 53 or older, your body mass index should be between 23-30. Your Body mass index is 39.02 kg/m. If this is out of the aforementioned range listed, please consider follow up with your Primary Care Provider.  If you are age 54 or younger, your body mass index should be between 19-25. Your Body mass index is 39.02 kg/m. If this is out of the aformentioned range listed, please consider follow up with your Primary Care Provider.   Discontinue using Linzess.  We are giving you samples of [place] today.  Take one a day.  Please call us after you've tried it for a week and let us know if it is helpful and we can send you a prescription.  Thank you for entrusting me with your care and for choosing [facility], Dr. Festus Cellar

## 2017-12-13 NOTE — Progress Notes (Signed)
HPI :  52 year old female here for follow-up visit for internal hemorrhoids and chronic constipation.  She had RP hemorrhoid banded 11/11/17 and LL banded on 11/28/17. She did well after the first banding but had some discomfort for the first 24 hours after the second banding. She states she had some rectal discomfort which slowly abated on its own, started after the procedure. She is a bit anxious about having further therapy. Her main symptoms of bleeding and irritation from the hemorrhoids have completely resolved after the first two banding. She has no complaints right now in regards to those symptoms.  She does Have some ongoing constipation which is bothering her. She had been taking Linzess to 290 g daily which has not worked too well recently. She is asking about other options. She states previous trial of MiraLAX has not worked too well either.   Past Medical History:  Diagnosis Date  . Anemia   . Anxiety   . Asthma   . Carpal tunnel syndrome   . Chronic back pain   . Depression   . History of multiple miscarriages   . Hyperlipidemia      Past Surgical History:  Procedure Laterality Date  . BREAST SURGERY  2000   reduction  . CESAREAN SECTION    . COSMETIC SURGERY  2000   breast reduction  . REDUCTION MAMMAPLASTY Bilateral    Family History  Problem Relation Age of Onset  . Diabetes Mother   . Hypertension Mother   . Hypertension Father   . Heart disease Father   . Early death Brother   . Kidney disease Maternal Grandmother   . Heart disease Brother   . Colon cancer Neg Hx   . Rectal cancer Neg Hx    Social History   Tobacco Use  . Smoking status: Current Every Day Smoker    Packs/day: 0.25    Years: 30.00    Pack years: 7.50    Types: Cigarettes  . Smokeless tobacco: Never Used  Substance Use Topics  . Alcohol use: No  . Drug use: No   Current Outpatient Medications  Medication Sig Dispense Refill  . Biotin 5000 MCG CAPS Take 1 capsule by mouth.      . cloNIDine (CATAPRES) 0.1 MG tablet Take 1 tablet (0.1 mg total) by mouth 2 (two) times daily as needed. 60 tablet 2  . Cyanocobalamin (VITAMIN B 12 PO) Take 1 tablet by mouth daily.     . cyclobenzaprine (FLEXERIL) 10 MG tablet Take 1 tablet (10 mg total) by mouth 3 (three) times daily as needed for muscle spasms. 30 tablet 0  . furosemide (LASIX) 20 MG tablet Take 1 tablet (20 mg total) by mouth daily. 30 tablet 6  . gabapentin (NEURONTIN) 300 MG capsule Take 600 mg by mouth 3 (three) times daily.    . hydrOXYzine (VISTARIL) 50 MG capsule Take 1 capsule (50 mg total) by mouth 3 (three) times daily as needed. 90 capsule 2  . Lidocaine-Hydrocortisone Ace 3-0.5 % CREA Apply topically twice per day. (Patient taking differently: daily as needed. Apply topically twice per day.) 30 g 0  . linaclotide (LINZESS) 290 MCG CAPS capsule Take 1 capsule (290 mcg total) by mouth daily before breakfast. 12 capsule 0  . medroxyPROGESTERone (PROVERA) 5 MG tablet Take 1 tablet a day for 5 days every other month (start 10/09/17, 12/09/17, 02/09/18) 15 tablet 0  . meloxicam (MOBIC) 7.5 MG tablet Take 7.5 mg by mouth 2 (two) times daily.  5  . mirtazapine (REMERON) 45 MG tablet Take 1 tablet (45 mg total) by mouth at bedtime. 30 tablet 2  . nebivolol (BYSTOLIC) 10 MG tablet Take 1 tablet (10 mg total) by mouth daily. 30 tablet 6  . traMADol (ULTRAM) 50 MG tablet Take 50 mg by mouth 4 (four) times daily.     . vitamin C (ASCORBIC ACID) 500 MG tablet Take 500 mg by mouth daily.    . Vitamin D, Ergocalciferol, (DRISDOL) 50000 units CAPS capsule TAKE 1 CAPSULE (50,000 UNITS TOTAL) BY MOUTH EVERY 7 (SEVEN) DAYS. X12 WEEKS. 12 capsule 0   Current Facility-Administered Medications  Medication Dose Route Frequency Provider Last Rate Last Dose  . 0.9 %  sodium chloride infusion  500 mL Intravenous Once Armbruster, Carlota Raspberry, MD       Allergies  Allergen Reactions  . Trazodone And Nefazodone Hives     Review of Systems: All  systems reviewed and negative except where noted in HPI.   Lab Results  Component Value Date   WBC 6.5 09/13/2017   HGB 14.2 09/13/2017   HCT 43.3 09/13/2017   MCV 81.9 09/13/2017   PLT 255 09/13/2017    Lab Results  Component Value Date   CREATININE 0.81 09/13/2017   BUN 18 09/13/2017   NA 141 09/13/2017   K 5.1 09/13/2017   CL 106 09/13/2017   CO2 30 09/13/2017    Lab Results  Component Value Date   ALT 26 09/13/2017   AST 18 09/13/2017   ALKPHOS 74 08/01/2017   BILITOT 0.5 09/13/2017     Physical Exam: BP 136/66   Pulse 70   Ht 5' 7.25" (1.708 m)   Wt 251 lb (113.9 kg)   LMP 05/19/2016 (Approximate)   BMI 39.02 kg/m  Constitutional: Pleasant,well-developed, female in no acute distress. HEENT: Normocephalic and atraumatic. Conjunctivae are normal. No scleral icterus. Neck supple.  Cardiovascular: Normal rate, regular rhythm.  Pulmonary/chest: Effort normal and breath sounds normal. No wheezing, rales or rhonchi. Abdominal: Soft, nondistended, nontender.  There are no masses palpable. No hepatomegaly. Extremities: no edema Lymphadenopathy: No cervical adenopathy noted. Neurological: Alert and oriented to person place and time. Skin: Skin is warm and dry. No rashes noted. Psychiatric: Normal mood and affect. Behavior is normal.   ASSESSMENT AND PLAN: 52 year old female here for reassessment following issues:  Internal hemorrhoids - symptoms have resolved status post 2 bandings she had last month. Unfortunately she had about 24 hours worth of discomfort after the last banding procedure. Given this occurrence and that her initial symptoms for doing a banding have since resolved and she is doing quite well, we'll hold off on further banding today. Hopefully she does well moving forward, we'll focus on management of constipation as outlined below, to help reduce risk of recurrence.  Chronic constipation - this far not much benefit with MiraLAX or high-dose Linzess,  states Linzess initially worked well but not doing too well recently. We'll stop Linzess at this time and give a trial of Motegrity 2mg / day for one week. I provided her a free sample. If this works for her, will fill a prescription. If not and she does not like it, will discuss other options. Otherwise f/u PRN.  Horizon [place] Cellar, MD [facility]

## 2017-12-16 ENCOUNTER — Telehealth: Payer: Self-pay | Admitting: Gastroenterology

## 2017-12-16 NOTE — Telephone Encounter (Signed)
Routed to Dr. Armbruster. 

## 2017-12-16 NOTE — Telephone Encounter (Signed)
She only took 2 doses and none today but she did have a bowel movement today.  Symptom that she described to me was crampy not pain.  She is going to give it another day or 2 and then report back to [place] if she wants to continue or change.  She will need a prescription if she continues.

## 2017-12-16 NOTE — Telephone Encounter (Signed)
Melody Jenkins can you clarify how many times she took it, and if it helped her constipation? If it's mild and tolerable but working for the constipation, we can see if she wishes to continue it. If the symptoms are severe or it's not working. We can try something else. She has recently not done well with Linzess. We can give her a sample of Trulance in the clinic for one week if she wishes to try that if Motegrity is not working for her. Thanks

## 2017-12-16 NOTE — Telephone Encounter (Signed)
Okay thanks very much

## 2017-12-18 ENCOUNTER — Ambulatory Visit (INDEPENDENT_AMBULATORY_CARE_PROVIDER_SITE_OTHER): Payer: 59 | Admitting: Licensed Clinical Social Worker

## 2017-12-18 ENCOUNTER — Encounter (HOSPITAL_COMMUNITY): Payer: Self-pay | Admitting: Licensed Clinical Social Worker

## 2017-12-18 DIAGNOSIS — F333 Major depressive disorder, recurrent, severe with psychotic symptoms: Secondary | ICD-10-CM
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
## 2017-12-20 ENCOUNTER — Telehealth: Payer: Self-pay | Admitting: Gastroenterology

## 2017-12-20 MED ORDER — PRUCALOPRIDE SUCCINATE 2 MG PO TABS: 2.0000 mg | ORAL_TABLET | Freq: Every day | ORAL | 1 refills | Status: DC

## 2017-12-20 NOTE — Telephone Encounter (Signed)
Per Dr. Mickey Farber note from 12-16-17, pt can try Motegrity 2mg /day and get Rx if it helps

## 2017-12-24 ENCOUNTER — Telehealth: Payer: Self-pay
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
## 2017-12-25 ENCOUNTER — Other Ambulatory Visit: Payer: Self-pay

## 2017-12-25 MED ORDER — PRUCALOPRIDE SUCCINATE 2 MG PO TABS: 2.0000 mg | ORAL_TABLET | Freq: Every day | ORAL | 1 refills | Status: DC

## 2017-12-25 NOTE — Progress Notes (Signed)
Pt asked that script for Motegrity be sent to [facility] on [address].

## 2017-12-26 ENCOUNTER — Telehealth: Payer: Self-pay | Admitting: Gastroenterology

## 2017-12-26 NOTE — Telephone Encounter (Signed)
Called and spoke to pt. Even with the discount card the cost for a 30 day supply was still close to $500. What else could she try?  Thank you

## 2017-12-26 NOTE — Telephone Encounter (Signed)
Jan do you know who the rep is for Motegrity, and we can see if they can offer any assistance? Thanks

## 2017-12-27 NOTE — Telephone Encounter (Signed)
Called pt. I will leave more Motegrity samples up front for her to pick up and will fill out the paper for [facility] application for patient assistance for [employer].  Pt was appreciative and will come pick these up.

## 2017-12-27 NOTE — Telephone Encounter (Signed)
Pt calling back in regards of this states she is going to buy OTC Miralax for the meantime.

## 2017-12-30 ENCOUNTER — Encounter: Payer: Self-pay | Admitting: Family Medicine

## 2018-01-01 ENCOUNTER — Other Ambulatory Visit: Payer: Self-pay | Admitting: Family Medicine

## 2018-01-01 ENCOUNTER — Encounter (HOSPITAL_COMMUNITY): Payer: Self-pay | Admitting: Licensed Clinical Social Worker

## 2018-01-01 ENCOUNTER — Ambulatory Visit (INDEPENDENT_AMBULATORY_CARE_PROVIDER_SITE_OTHER): Payer: 59 | Admitting: Licensed Clinical Social Worker

## 2018-01-01 DIAGNOSIS — F331 Major depressive disorder, recurrent, moderate: Secondary | ICD-10-CM | POA: Diagnosis not present

## 2018-01-01 DIAGNOSIS — F333 Major depressive disorder, recurrent, severe with psychotic symptoms: Secondary | ICD-10-CM

## 2018-01-01 NOTE — Progress Notes (Signed)
   THERAPIST PROGRESS NOTE  Session Time: 10:10-11am  Participation Level: Active  Behavioral Response: Casual/Alert/Agitated  Type of Therapy: Individual Therapy  Treatment Goals addressed: Coping  Interventions: CBT  Summary: Melody Jenkins is a 52 y.o. female presents for her individual  counseling session. Pt discussed her psychiatric symptoms and current life events. Pt was agitated at presentation, caused by her chronic pain. It was visible that she was in extreme pain. Pt feels she has more anxiety days. Discussed reasons behind these days: numerous dr appointments, gathering information for lawsuit and disability hearing, gathering information for current long term disability at work, relationship issues with daughter. Educated pt on how to compartmentalize all of her stressors. Showed pt how to develop strategies to complete the tasks for each stressor. Pt spoke a lot of her fiance who passed away 2 years ago this summer. Pt spoke a lot about her grief, sadness and memories. Validated pt on her feelings.  Pt continues to struggle with stressors and chronic pain. Discussed again journaling, meditation and breathing exercises.      Suicidal/Homicidal: Nowithout intent/plan  Therapist Response: Assessed pt's current functioning and reviewed progress. Assisted pt processing  Relationships, self care, chronic pain, compartmentalizing stressors, fiance.  Assisted pt  processing for management of stressors.  Plan: see again in 2 weeks  Diagnosis: Axis I: MDD, recurrent episode moderate        Anaisha Mago S, LCAS  01/01/18

## 2018-01-02 ENCOUNTER — Telehealth: Payer: Self-pay

## 2018-01-02 NOTE — Telephone Encounter (Signed)
rec'd fax from [facility] that they are still waiting on pt's proof of income.  Called and let pt know.  She expressed understanding and will send that to them.  [employer]: [phone].

## 2018-01-02 NOTE — Telephone Encounter (Signed)
Error

## 2018-01-04 ENCOUNTER — Encounter: Payer: Self-pay | Admitting: Family Medicine

## 2018-01-04 DIAGNOSIS — F411 Generalized anxiety disorder: Secondary | ICD-10-CM

## 2018-01-06 MED ORDER — CLONIDINE HCL 0.2 MG PO TABS: 0.2000 mg | ORAL_TABLET | Freq: Every day | ORAL | 2 refills | Status: DC

## 2018-01-06 MED ORDER — CLONIDINE HCL 0.1 MG PO TABS: 0.1000 mg | ORAL_TABLET | Freq: Every day | ORAL | 2 refills | Status: DC

## 2018-01-06 MED ORDER — CLONIDINE HCL 0.1 MG PO TABS
0.1000 mg | ORAL_TABLET | Freq: Every day | ORAL | 2 refills | Status: DC
Start: 2018-01-06 — End: 2018-03-27

## 2018-01-08 ENCOUNTER — Encounter: Payer: Self-pay | Admitting: Family Medicine

## 2018-01-13 ENCOUNTER — Ambulatory Visit (INDEPENDENT_AMBULATORY_CARE_PROVIDER_SITE_OTHER): Payer: 59 | Admitting: Family Medicine

## 2018-01-13 ENCOUNTER — Encounter: Payer: Self-pay | Admitting: Family Medicine

## 2018-01-13 ENCOUNTER — Other Ambulatory Visit: Payer: Self-pay

## 2018-01-13 VITALS — BP 142/88 | HR 70 | Temp 97.9°F | Resp 14 | Ht 67.0 in | Wt 253.0 lb

## 2018-01-13 DIAGNOSIS — Z6839 Body mass index (BMI) 39.0-39.9, adult: Secondary | ICD-10-CM

## 2018-01-13 DIAGNOSIS — I10 Essential (primary) hypertension: Secondary | ICD-10-CM | POA: Diagnosis not present

## 2018-01-13 DIAGNOSIS — E782 Mixed hyperlipidemia: Secondary | ICD-10-CM

## 2018-01-13 DIAGNOSIS — E7439 Other disorders of intestinal carbohydrate absorption: Secondary | ICD-10-CM

## 2018-01-13 DIAGNOSIS — F331 Major depressive disorder, recurrent, moderate: Secondary | ICD-10-CM

## 2018-01-13 NOTE — Patient Instructions (Addendum)
Continue clonidine 0.1mg  in the morning and 0.2mg  in the evening  Monitor blood pressure and send me readings for in 1 week We will call with lab results  F/u with medicaid office F/U 3 months

## 2018-01-13 NOTE — Assessment & Plan Note (Signed)
Continue with psychotherapy recommend that she call her therapist to see if she can have another visit during this time.  No changes made to her medications otherwise.

## 2018-01-13 NOTE — Progress Notes (Signed)
Subjective:    Patient ID: Melody Jenkins, female    DOB: 1966/01/28, 52 y.o.   MRN: [id number]  Patient presents for Follow-up (is fasting) and HTN (elevated BP) Here to follow-up chronic medical problems.  Hypertension she sent an email saying that her blood pressures been running up significantly.  Has been on Bystolic as well as clonidine from her psychiatrist for anxiety.  She is also on Lasix.  Her blood pressure levels were significantly elevated recommended that she increase her clonidine to  0.1mg  in the morning mg  and continue 0.2mg  in the evening  Has had some headaches, no chest pain, no SOB  Blood pressure starting going up when her daughter moved out , this is also the anniversary of her fall and all the issues before her financee died   150-164/ 95- 101 Then 139/89-140/90, yesterday 144/94  Borderline diabetes mellitus her last A1c was 6.3% in April discussed dietary changes.  Her weight at that visit was 251 pounds. Weight up 2lbs   LDL also mildly elevated at  136 in April     Currently on provera states this is caused some weight gain.  She has appointment next month to see if she is able to come off of the medication.  Recently saw Dr. B[facility]  for her chronic pain- gets shooting pains down her left leg, right arm pain randomly No longer getting shots for pain- had minimal improvement       Review Of Systems:  GEN- denies fatigue, fever, weight loss,weakness, recent illness HEENT- denies eye drainage, change in vision, nasal discharge, CVS- denies chest pain, palpitations RESP- denies SOB, cough, wheeze ABD- denies N/V, change in stools, abd pain GU- denies dysuria, hematuria, dribbling, incontinence MSK- + joint pain, muscle aches, injury Neuro- denies headache, dizziness, syncope, seizure activity       Objective:    BP (!) 142/88   Pulse 70   Temp 97.9 F (36.6 C) (Oral)   Resp 14   Ht 5\' 7"  (1.702 m)   Wt 253 lb (114.8 kg)   LMP  05/19/2016 (Approximate)   SpO2 96%   BMI 39.63 kg/m  GEN- NAD, alert and oriented x3 HEENT- PERRL, EOMI, non injected sclera, pink conjunctiva, MMM, oropharynx clear Neck- Supple, no thyromegaly CVS- RRR, no murmur RESP-CTAB Psych -depressed affect tearful the entire exam.  No suicidal ideations good eye contact normal speech EXT- No edema Pulses- Radial, DP- 2+        Assessment & Plan:      Problem List Items Addressed This Visit      Unprioritized   Depression    Continue with psychotherapy recommend that she call her therapist to see if she can have another visit during this time.  No changes made to her medications otherwise.      Essential hypertension, benign    I think the setting of her blood pressure going up is secondary to her anxiety and depression especially with her daughter moving out this was very difficult for her because she was getting help from her daughter.  She is no check into her insurance to see if she qualifies for any type of personal care services she does not have Medicaid to cover this currently.  She is going to continue with the current dose of clonidine point 1 in the morning and 0.2 mg in the evening continue with the Bystolic and the Lasix.  Her blood pressures are coming down.  She is  going to continue to monitor them and send me her blood pressure readings on Friday before adding anything.  The next step would be adding losartan 25 mg once a day.  We will check her labs today make sure nothing metabolic is also contribute to her blood pressure going up      Relevant Orders   CBC with Differential/Platelet   Comprehensive metabolic panel   Lipid panel   TSH   Glucose intolerance - Primary   Relevant Orders   Hemoglobin A1c   Hyperlipidemia   Relevant Orders   Lipid panel   Obesity      Note: This dictation was prepared with Dragon dictation along with smaller phrase technology. Any transcriptional errors that result from this process  are unintentional.

## 2018-01-13 NOTE — Assessment & Plan Note (Signed)
I think the setting of her blood pressure going up is secondary to her anxiety and depression especially with her daughter moving out this was very difficult for her because she was getting help from her daughter.  She is no check into her insurance to see if she qualifies for any type of personal care services she does not have Medicaid to cover this currently.  She is going to continue with the current dose of clonidine point 1 in the morning and 0.2 mg in the evening continue with the Bystolic and the Lasix.  Her blood pressures are coming down.  She is going to continue to monitor them and send me her blood pressure readings on Friday before adding anything.  The next step would be adding losartan 25 mg once a day.  We will check her labs today make sure nothing metabolic is also contribute to her blood pressure going up

## 2018-01-14 LAB — COMPREHENSIVE METABOLIC PANEL
AG Ratio: 1.2 (calc) (ref 1.0–2.5)
ALBUMIN MSPROF: 3.9 g/dL (ref 3.6–5.1)
ALT: 18 U/L (ref 6–29)
AST: 15 U/L (ref 10–35)
Alkaline phosphatase (APISO): 75 U/L (ref 33–130)
BILIRUBIN TOTAL: 0.4 mg/dL (ref 0.2–1.2)
BUN: 18 mg/dL (ref 7–25)
CALCIUM: 9.8 mg/dL (ref 8.6–10.4)
CHLORIDE: 102 mmol/L (ref 98–110)
CO2: 31 mmol/L (ref 20–32)
Creat: 0.9 mg/dL (ref 0.50–1.05)
GLOBULIN: 3.2 g/dL (ref 1.9–3.7)
Glucose, Bld: 122 mg/dL — ABNORMAL HIGH (ref 65–99)
POTASSIUM: 4.6 mmol/L (ref 3.5–5.3)
SODIUM: 140 mmol/L (ref 135–146)
TOTAL PROTEIN: 7.1 g/dL (ref 6.1–8.1)

## 2018-01-14 LAB — CBC WITH DIFFERENTIAL/PLATELET
Basophils Absolute: 39 cells/uL (ref 0–200)
Basophils Relative: 0.7 %
EOS ABS: 149 {cells}/uL (ref 15–500)
Eosinophils Relative: 2.7 %
HEMATOCRIT: 44.9 % (ref 35.0–45.0)
Hemoglobin: 14 g/dL (ref 11.7–15.5)
Lymphs Abs: 2899 cells/uL (ref 850–3900)
MCH: 25.9 pg — ABNORMAL LOW (ref 27.0–33.0)
MCHC: 31.2 g/dL — ABNORMAL LOW (ref 32.0–36.0)
MCV: 83 fL (ref 80.0–100.0)
MPV: 10.2 fL (ref 7.5–12.5)
Monocytes Relative: 8.4 %
Neutro Abs: 1953 cells/uL (ref 1500–7800)
Neutrophils Relative %: 35.5 %
Platelets: 241 10*3/uL (ref 140–400)
RBC: 5.41 10*6/uL — ABNORMAL HIGH (ref 3.80–5.10)
RDW: 14.1 % (ref 11.0–15.0)
Total Lymphocyte: 52.7 %
WBC: 5.5 10*3/uL (ref 3.8–10.8)
WBCMIX: 462 {cells}/uL (ref 200–950)

## 2018-01-14 LAB — LIPID PANEL
CHOL/HDL RATIO: 5.8 (calc) — AB (ref <5.0)
CHOLESTEROL: 187 mg/dL (ref <200)
HDL: 32 mg/dL — AB (ref >50)
LDL CHOLESTEROL (CALC): 111 mg/dL — AB
Non-HDL Cholesterol (Calc): 155 mg/dL (calc) — ABNORMAL HIGH (ref <130)
TRIGLYCERIDES: 333 mg/dL — AB (ref <150)

## 2018-01-14 LAB — TSH: TSH: 1.13 m[IU]/L

## 2018-01-14 LAB — HEMOGLOBIN A1C
Hgb A1c MFr Bld: 6.4 % of total Hgb — ABNORMAL HIGH (ref <5.7)
Mean Plasma Glucose: 137 (calc)
eAG (mmol/L): 7.6 (calc)

## 2018-01-15 ENCOUNTER — Encounter (HOSPITAL_COMMUNITY): Payer: Self-pay | Admitting: Licensed Clinical Social Worker

## 2018-01-15 ENCOUNTER — Encounter: Payer: Self-pay | Admitting: Family Medicine

## 2018-01-15 ENCOUNTER — Ambulatory Visit (INDEPENDENT_AMBULATORY_CARE_PROVIDER_SITE_OTHER): Payer: 59 | Admitting: Licensed Clinical Social Worker

## 2018-01-15 DIAGNOSIS — F333 Major depressive disorder, recurrent, severe with psychotic symptoms: Secondary | ICD-10-CM | POA: Diagnosis not present

## 2018-01-15 NOTE — Progress Notes (Signed)
   THERAPIST PROGRESS NOTE  Session Time: 11:10:10-12pm  Participation Level: Active  Behavioral Response: Casual/Alert/Anxious/Depressed  Type of Therapy: Individual Therapy  Treatment Goals addressed: Coping  Interventions: CBT  Summary: Melody Jenkins is a 52 y.o. female presents for her individual  counseling session. Pt discussed her psychiatric symptoms and current life events. Pt presents depressed today. It was evident she is in extreme pain today. Pt's bp has regularly increased. She has seen her PCP as well as her Pain management dr. Who are addressing her pain and bp. Asked pt if she wants to increase her therapy visits to weekly. This time of year is more difficult due to the anniversary of her fall, her fiance's death, his birthday and her birthday. She was tearful in discussion. Discussed grief with pt. Discussed relationship circles with pt, who she lets in to her personal space and why. Asked open ended questions. Pt reached out to her daughter by text, trying to repair their relationship. Her daughter didn't respond. Asked open ended questions about pt's feelings about her non response. Pt was tearful in talking about her feelings.        Suicidal/Homicidal: Nowithout intent/plan  Therapist Response: Assessed pt's current functioning and reviewed progress. Assisted pt processing  Relationships, daughter, chronic pain, feeling feelings, grief, fiance.  Assisted pt  processing for management of stressors.  Plan: see again in 2 weeks  Diagnosis: Axis I: MDD, recurrent episode moderate        Miley Blanchett S, LCAS  01/15/18

## 2018-01-16 ENCOUNTER — Other Ambulatory Visit: Payer: Self-pay | Admitting: *Deleted

## 2018-01-16 MED ORDER — METFORMIN HCL 500 MG PO TABS: 500.0000 mg | ORAL_TABLET | Freq: Every day | ORAL | 3 refills | Status: DC

## 2018-01-23 ENCOUNTER — Encounter (HOSPITAL_COMMUNITY): Payer: Self-pay | Admitting: Psychiatry

## 2018-01-23 ENCOUNTER — Ambulatory Visit (INDEPENDENT_AMBULATORY_CARE_PROVIDER_SITE_OTHER): Payer: 59 | Admitting: Psychiatry

## 2018-01-23 VITALS — BP 134/72 | HR 74 | Ht 67.0 in | Wt 244.0 lb

## 2018-01-23 DIAGNOSIS — F17210 Nicotine dependence, cigarettes, uncomplicated: Secondary | ICD-10-CM | POA: Diagnosis not present

## 2018-01-23 DIAGNOSIS — R450 Nervousness: Secondary | ICD-10-CM

## 2018-01-23 DIAGNOSIS — F4310 Post-traumatic stress disorder, unspecified: Secondary | ICD-10-CM

## 2018-01-23 DIAGNOSIS — G4701 Insomnia due to medical condition: Secondary | ICD-10-CM | POA: Diagnosis not present

## 2018-01-23 DIAGNOSIS — F333 Major depressive disorder, recurrent, severe with psychotic symptoms: Secondary | ICD-10-CM | POA: Diagnosis not present

## 2018-01-23 MED ORDER — MIRTAZAPINE 45 MG PO TABS: 45.0000 mg | ORAL_TABLET | Freq: Every day | ORAL | 1 refills | Status: DC

## 2018-01-23 MED ORDER — HYDROXYZINE PAMOATE 50 MG PO CAPS: 50.0000 mg | ORAL_CAPSULE | Freq: Three times a day (TID) | ORAL | 1 refills | Status: DC | PRN

## 2018-01-23 MED ORDER — LITHIUM CARBONATE ER 300 MG PO TBCR: 300.0000 mg | EXTENDED_RELEASE_TABLET | Freq: Every day | ORAL | 1 refills | Status: DC

## 2018-01-23 NOTE — Progress Notes (Signed)
Melody Jenkins  01/23/2018 11:25 AM Melody Jenkins  MRN:  [id number]  Chief Complaint:  Chief Complaint    Depression; Anxiety     HPI: Patient states that she is not doing well.  Her pain remains uncontrolled.  Her daughter moved out suddenly over 1 month ago.  Patient states that her mother told her that her daughter feels as though she is in a "prison ".  Patient noticed over the last few months that her daughter is doing less and less of the chores around the home and is not helping her very much.  Patient became angry over the situation and told her daughter to leave.  Her daughter moved out and only recently had a small conversation with her.  Patient is sad and tearful about this.  She is extremely stressed about her health and limited mobility.  She states new health problems seem to be coming at all times.  And on top of all the stressors that she already was experiencing she was told that she is officially terminated from her job and her last day will be on her birthday.  Patient states she does not know how much more she can handle.  Patient denies SI/HI but states she just wants relief.  [name] states that she has trouble processing information and gets very nervous.  She is unable to budget or go over her female.  Her mother comes once a week and they go through the mail together.  Her sleep remains poor due to pain.  Patient thinks that her depression is getting worse and due to her husband's death anniversary coming in 2023-03-05 as well as her own birthday.    Visit Diagnosis:    ICD-10-CM   1. MDD (major depressive disorder), recurrent, severe, with psychosis ([place]) F33.3 mirtazapine (REMERON) 45 MG tablet    lithium carbonate (LITHOBID) 300 MG CR tablet  2. PTSD (post-traumatic stress disorder) F43.10 hydrOXYzine (VISTARIL) 50 MG capsule  3. Insomnia due to medical condition G47.01 mirtazapine (REMERON) 45 MG tablet      Past Psychiatric History:  Patient endorse  history of depression 20 years ago when she was given Lexapro by her primary care physician. She remember a good response from the Lexapro. Patient denies any history of psychiatric inpatient treatment, psychosis, suicidal attempt, mania, hallucination, OCD symptoms.   Previous Psychotropic Medications:Lexapro, Effexor, Vistaril-ineffective; Pristiq- helpful but increased BP  Substance Abuse History in the last 12 months: No.  Consequences of Substance Abuse: Patient endorse history of drinking alcohol on the weekends and get some time intoxicated but denies any history of seizures, tremors, blackouts or any withdrawal symptoms.    Past Medical History:  Past Medical History:  Diagnosis Date  . Anemia   . Anxiety   . Asthma   . Carpal tunnel syndrome   . Chronic back pain   . Depression   . History of multiple miscarriages   . Hyperlipidemia     Past Surgical History:  Procedure Laterality Date  . BREAST SURGERY  2000   reduction  . CESAREAN SECTION    . COSMETIC SURGERY  2000   breast reduction  . REDUCTION MAMMAPLASTY Bilateral     Family Psychiatric History:  Family History  Problem Relation Age of Onset  . Diabetes Mother   . Hypertension Mother   . Hypertension Father   . Heart disease Father   . Early death Brother   . Kidney disease Maternal Grandmother   .  Heart disease Brother   . Colon cancer Neg Hx   . Rectal cancer Neg Hx     Social History:  Social History   Socioeconomic History  . Marital status: Single    Spouse name: Not on file  . Number of children: 1  . Years of education: Not on file  . Highest education level: Not on file  Occupational History  . Occupation: disablity  Social Needs  . Financial resource strain: Very hard  . Food insecurity:    Worry: Never true    Inability: Never true  . Transportation needs:    Medical: No    Non-medical: No  Tobacco Use  . Smoking status: Current Every Day Smoker    Packs/day: 0.25     Years: 30.00    Pack years: 7.50    Types: Cigarettes  . Smokeless tobacco: Never Used  Substance and Sexual Activity  . Alcohol use: No  . Drug use: No  . Sexual activity: Not Currently    Partners: Male    Birth control/protection: Post-menopausal  Lifestyle  . Physical activity:    Days per week: 0 days    Minutes per session: 0 min  . Stress: Rather much  Relationships  . Social connections:    Talks on phone: More than three times a week    Gets together: Once a week    Attends religious service: Never    Active member of club or organization: No    Attends meetings of clubs or organizations: Never    Relationship status: Never married  Other Topics Concern  . Not on file  Social History Narrative  . Not on file    Allergies:  Allergies  Allergen Reactions  . Trazodone And Nefazodone Hives    Metabolic Disorder Labs: Lab Results  Component Value Date   HGBA1C 6.4 (H) 01/13/2018   MPG 137 01/13/2018   MPG 134 09/13/2017   No results found for: PROLACTIN Lab Results  Component Value Date   CHOL 187 01/13/2018   TRIG 333 (H) 01/13/2018   HDL 32 (L) 01/13/2018   CHOLHDL 5.8 (H) 01/13/2018   VLDL 29 03/05/2016   LDLCALC 111 (H) 01/13/2018   LDLCALC 136 (H) 09/13/2017   Lab Results  Component Value Date   TSH 1.13 01/13/2018   TSH 0.83 11/07/2016    Therapeutic Level Labs: No results found for: LITHIUM No results found for: VALPROATE No components found for:  CBMZ  Current Medications: Current Outpatient Medications  Medication Sig Dispense Refill  . Biotin 5000 MCG CAPS Take 1 capsule by mouth.    . cloNIDine (CATAPRES) 0.1 MG tablet Take 1 tablet (0.1 mg total) by mouth daily. In the morning. 30 tablet 2  . cloNIDine (CATAPRES) 0.2 MG tablet Take 1 tablet (0.2 mg total) by mouth daily. In the evening. 30 tablet 2  . Cyanocobalamin (VITAMIN B 12 PO) Take 1 tablet by mouth daily.     . cyclobenzaprine (FLEXERIL) 10 MG tablet Take 1 tablet (10 mg  total) by mouth 3 (three) times daily as needed for muscle spasms. 30 tablet 0  . furosemide (LASIX) 20 MG tablet Take 1 tablet (20 mg total) by mouth daily. 30 tablet 6  . gabapentin (NEURONTIN) 300 MG capsule Take 600 mg by mouth 3 (three) times daily.    . hydrOXYzine (VISTARIL) 50 MG capsule Take 1 capsule (50 mg total) by mouth 3 (three) times daily as needed. 90 capsule 1  . Lidocaine-Hydrocortisone  Ace 3-0.5 % CREA Apply topically twice per day. (Patient taking differently: daily as needed. Apply topically twice per day.) 30 g 0  . lithium carbonate (LITHOBID) 300 MG CR tablet Take 1 tablet (300 mg total) by mouth at bedtime. 30 tablet 1  . medroxyPROGESTERone (PROVERA) 5 MG tablet Take 1 tablet a day for 5 days every other month (start 10/09/17, 12/09/17, 02/09/18) 15 tablet 0  . meloxicam (MOBIC) 7.5 MG tablet Take 7.5 mg by mouth 2 (two) times daily.  5  . metFORMIN (GLUCOPHAGE) 500 MG tablet Take 1 tablet (500 mg total) by mouth daily with breakfast. 90 tablet 3  . mirtazapine (REMERON) 45 MG tablet Take 1 tablet (45 mg total) by mouth at bedtime. 30 tablet 1  . nebivolol (BYSTOLIC) 10 MG tablet Take 1 tablet (10 mg total) by mouth daily. 30 tablet 6  . Prucalopride Succinate (MOTEGRITY) 2 MG TABS Take 2 mg by mouth daily. 30 tablet 1  . traMADol (ULTRAM) 50 MG tablet Take 50 mg by mouth 4 (four) times daily.     . vitamin C (ASCORBIC ACID) 500 MG tablet Take 500 mg by mouth daily.     Current Facility-Administered Medications  Medication Dose Route Frequency Provider Last Rate Last Dose  . 0.9 %  sodium chloride infusion  500 mL Intravenous Once Armbruster, Carlota Raspberry, MD         Musculoskeletal: Strength & Muscle Tone: within normal limits Gait & Station: broad based, walking with cane Patient leans: N/A  Psychiatric Specialty Exam: Physical Exam  Review of Systems  Musculoskeletal: Positive for back pain, joint pain and myalgias.  Psychiatric/Behavioral: Positive for depression.  Negative for substance abuse. The patient is nervous/anxious and has insomnia.     Blood pressure 134/72, pulse 74, height 5\' 7"  (1.702 m), weight 244 lb (110.7 kg), last menstrual period 05/19/2016, SpO2 99 %.Body mass index is 38.22 kg/m.  General Appearance: Casual  Eye Contact:  Good  Speech:  Clear and Coherent and Normal Rate  Volume:  Normal  Mood:  Depressed  Affect:  Congruent and Tearful  Thought Process:  Coherent and Descriptions of Associations: Circumstantial  Orientation:  Full (Time, Place, and Person)  Thought Content:  Logical  Suicidal Thoughts:  No  Homicidal Thoughts:  No  Memory:  Immediate;   Good Recent;   Good Remote;   Good  Judgement:  Fair  Insight:  Fair  Psychomotor Activity:  Normal  Concentration:  Concentration: Fair and Attention Span: Fair  Recall:  Roundup of Knowledge:  Good  Language:  Good  Akathisia:  No  Handed:  Right  AIMS (if indicated):     Assets:  Desire for Improvement Financial Resources/Insurance Social Support  ADL's:  Intact  Cognition:  WNL  Sleep:   poor      Screenings: GAD-7     Office Visit from 02/06/2016 in [facility]  Total GAD-7 Score  21    PHQ2-9     Office Visit from 01/13/2018 in [facility] Office Visit from 09/13/2017 in [facility] Office Visit from 08/06/2017 in [facility] Office Visit from 05/15/2017 in [facility] Office Visit from 02/13/2017 in [facility]  PHQ-2 Total Score  4  0  5  5  4   PHQ-9 Total Score  17  0  18  21  18       I reviewed the information below on  01/23/2018 and agree Assessment and Plan: MDD with anxiety, recurrent, severe without psychotic features; PTSD; insomnia    Medication management with supportive therapy. Risks and benefits, side effects and alternative treatment options discussed with patient. Pt was given an opportunity to ask questions about medication, illness,  and treatment. All current psychiatric medications have been reviewed and discussed with the patient and adjusted as clinically appropriate. The patient has been provided an accurate and updated list of the medications being now prescribed. Patient expressed understanding of how their medications were to be used.  Pt verbalized understanding and verbal consent obtained for treatment.  The risk of un-intended pregnancy is low based on the fact that pt reports she is postmenopausal. Pt is aware that these meds carry a teratogenic risk. Pt will discuss plan of action if she does or plans to become pregnant in the future.  Status of current problems: ongoing  Meds: Vistaril 50 mg p.o. 3 times daily as needed anxiety as related to depression and PTSD Remeron 45 mg p.o. nightly for MDD, PTSD and insomnia Increased Clonidine 0.1 mg p.o. qAM and  0.2mg  qPM (prescribed by PCP) as needed anxiety- pt denies SE Start trial of Lithium ER 300mg  po qHS due to ongoing depression symptoms Patient is on Bystolic and Neurontin as prescribed by PCP both have been off label indication for anxiety   Labs: reviewed labs done 01/13/2018 glu 122, lipids elevated, HbA1c 6.4, TSH wnl  Therapy: brief supportive therapy provided. Discussed psychosocial stressors in detail.   Encouraged pt to develop daily routine and work on daily goal setting as a way to improve mood symptoms.   Consultations: Encouraged to follow up with therapist Encouraged to follow up with PCP as needed  Pt denies SI and is at an acute low risk for suicide. Patient told to call clinic if any problems occur. Patient advised to go to ER if they should develop SI/HI, side effects, or if symptoms worsen. Has crisis numbers to call if needed. Pt verbalized understanding.  F/up in 2 months or sooner if needed    Charlcie Cradle, MD 01/23/2018, 11:25 AM

## 2018-01-24 ENCOUNTER — Telehealth: Payer: Self-pay | Admitting: Gastroenterology

## 2018-01-24 NOTE — Telephone Encounter (Signed)
Patient states there was a letter sent directly to Dr.Armbruster that needs to be filled out, and patient is requesting to speak with someone about this. Please see phone note from 7.18.19. Not sure if this is about the same thing. Please advise.
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
## 2018-01-25 ENCOUNTER — Other Ambulatory Visit: Payer: Self-pay | Admitting: Family Medicine

## 2018-01-27 ENCOUNTER — Encounter: Payer: Self-pay | Admitting: Family Medicine

## 2018-01-28 ENCOUNTER — Ambulatory Visit
Admission: RE | Admit: 2018-01-28 | Discharge: 2018-01-28 | Disposition: A | Discharge status: Home / Self Care | Payer: 59 | Source: Ambulatory Visit | Attending: Family Medicine | Admitting: Family Medicine

## 2018-01-28 DIAGNOSIS — Z1231 Encounter for screening mammogram for malignant neoplasm of breast: Secondary | ICD-10-CM

## 2018-01-29 ENCOUNTER — Encounter (HOSPITAL_COMMUNITY): Payer: Self-pay | Admitting: Licensed Clinical Social Worker

## 2018-01-29 ENCOUNTER — Ambulatory Visit (INDEPENDENT_AMBULATORY_CARE_PROVIDER_SITE_OTHER): Payer: 59 | Admitting: Licensed Clinical Social Worker

## 2018-01-29 DIAGNOSIS — F333 Major depressive disorder, recurrent, severe with psychotic symptoms: Secondary | ICD-10-CM | POA: Diagnosis not present
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
## 2018-01-30 ENCOUNTER — Encounter: Payer: Self-pay | Admitting: Family Medicine

## 2018-02-12 ENCOUNTER — Encounter (HOSPITAL_COMMUNITY): Payer: Self-pay | Admitting: Licensed Clinical Social Worker

## 2018-02-12 ENCOUNTER — Ambulatory Visit (HOSPITAL_COMMUNITY): Payer: Self-pay | Admitting: Licensed Clinical Social Worker

## 2018-02-12 DIAGNOSIS — F333 Major depressive disorder, recurrent, severe with psychotic symptoms: Secondary | ICD-10-CM

## 2018-02-12 NOTE — Progress Notes (Signed)
   THERAPIST PROGRESS NOTE  Session Time: 11:10:10-12pm  Participation Level: Active  Behavioral Response: Casual/Alert/Anxious/Depressed  Type of Therapy: Individual Therapy  Treatment Goals addressed: Improve psychiatric symptoms, Controlled Behavior, Moderated Mood, Improve Unhelpful Thought Patterns, Emotional Regulation Skills (Moderate moods, anger management, stress management), Feel and express a full Range of Emotions, Learn about Diagnosis, Healthy Coping Skills, Recall the Traumatic event without being overwhelmed,    Interventions: CBT  Summary: KAILAH PENNEL is a 52 y.o. female presents for her individual  counseling session. Pt discussed her psychiatric symptoms and current life events. Pt presents depressed today. After 2 years on long term disability, her job termintated her, on her birthday. Asked open ended questions and used empathic reflection. Pt continues to struggle with chronic pain. She got a TINS unit for her back and she reports it's really helped. She still has pain of at least a 7/10. Pt still struggles with being focused because of her chronic pain. Her mother comes to her house 1x per week to review her mail and pay her bills. Her mother also accompanies pt to dr appts. Suggested that pt give her mother temporary power of attorney. Showed her how she can print a PDF form off the internet. Suggested she discuss this with her mother. Pt discussed her birthday which led to a discussion about next steps in her life. Asked open ended questions. Pt discussed current relationships that she struggles with. Taught pt relationship circles.           Suicidal/Homicidal: Nowithout intent/plan  Therapist Response: Assessed pt's current functioning and reviewed progress. Assisted pt processing job terminations, relationships, chronic pain, medical power of attorney.  Assisted pt  processing for management of stressors.  Plan: see again in 2 weeks  Diagnosis: Axis I:  MDD, recurrent episode moderate        MACKENZIE,LISBETH S, LCAS  02/12/18

## 2018-02-13 ENCOUNTER — Telehealth: Payer: Self-pay | Admitting: Obstetrics and Gynecology

## 2018-02-13 NOTE — Telephone Encounter (Signed)
Pt states she would like to come in to discuss her menstrual cycles. Requests Tuesday appointment. Denies any emergency complaints.  Office visit with Dr. Talbert Nan scheduled at patient convenience for 02/18/2018.  Encounter closed.

## 2018-02-13 NOTE — Telephone Encounter (Signed)
Patient calling requesting an appointment to see Dr. Talbert Nan regarding "personal reasons."

## 2018-02-13 NOTE — Progress Notes (Deleted)
GYNECOLOGY  VISIT   HPI: 52 y.o.   Single  African American  female   [phone] with Patient's last menstrual period was 05/19/2016 (approximate).   here for  Abnormal menses.   GYNECOLOGIC HISTORY: Patient's last menstrual period was 05/19/2016 (approximate). Contraception:provera Menopausal hormone therapy: none        OB History    Gravida  5   Para  1   Term  1   Preterm      AB  4   Living  1     SAB  3   TAB      Ectopic  1   Multiple      Live Births  1              Patient Active Problem List   Diagnosis Date Noted  . Chronic back pain   . Special screening for malignant neoplasms, colon 08/16/2017  . Glucose intolerance 02/13/2017  . Leg swelling 02/13/2017  . Chronic constipation 11/20/2016  . Hot flashes 11/20/2016  . Tear of MCL (medial collateral ligament) of knee, left, subsequent encounter 04/06/2016  . Anterolisthesis 04/06/2016  . Situational mixed anxiety and depressive disorder 02/26/2016  . Insomnia 02/24/2016  . Back pain with radiation 02/14/2016  . Grief reaction 02/14/2016  . Essential hypertension, benign 12/26/2015  . DDD (degenerative disc disease), lumbar 12/26/2015  . Seasonal allergies 12/26/2015  . Tobacco user 04/06/2015  . Obesity 04/06/2015  . Anemia   . Asthma   . Depression   . Hyperlipidemia     Past Medical History:  Diagnosis Date  . Anemia   . Anxiety   . Asthma   . Carpal tunnel syndrome   . Chronic back pain   . Depression   . History of multiple miscarriages   . Hyperlipidemia     Past Surgical History:  Procedure Laterality Date  . BREAST SURGERY  2000   reduction  . CESAREAN SECTION    . COSMETIC SURGERY  2000   breast reduction  . REDUCTION MAMMAPLASTY Bilateral     Current Outpatient Medications  Medication Sig Dispense Refill  . Biotin 5000 MCG CAPS Take 1 capsule by mouth.    . BYSTOLIC 10 MG tablet TAKE 1 TABLET BY MOUTH EVERY DAY 30 tablet 6  . cloNIDine (CATAPRES) 0.1 MG  tablet Take 1 tablet (0.1 mg total) by mouth daily. In the morning. 30 tablet 2  . cloNIDine (CATAPRES) 0.2 MG tablet Take 1 tablet (0.2 mg total) by mouth daily. In the evening. 30 tablet 2  . Cyanocobalamin (VITAMIN B 12 PO) Take 1 tablet by mouth daily.     . cyclobenzaprine (FLEXERIL) 10 MG tablet Take 1 tablet (10 mg total) by mouth 3 (three) times daily as needed for muscle spasms. 30 tablet 0  . furosemide (LASIX) 20 MG tablet Take 1 tablet (20 mg total) by mouth daily. 30 tablet 6  . gabapentin (NEURONTIN) 300 MG capsule Take 600 mg by mouth 3 (three) times daily.    . hydrOXYzine (VISTARIL) 50 MG capsule Take 1 capsule (50 mg total) by mouth 3 (three) times daily as needed. 90 capsule 1  . Lidocaine-Hydrocortisone Ace 3-0.5 % CREA Apply topically twice per day. (Patient taking differently: daily as needed. Apply topically twice per day.) 30 g 0  . lithium carbonate (LITHOBID) 300 MG CR tablet Take 1 tablet (300 mg total) by mouth at bedtime. 30 tablet 1  . medroxyPROGESTERone (PROVERA) 5 MG tablet Take 1  tablet a day for 5 days every other month (start 10/09/17, 12/09/17, 02/09/18) 15 tablet 0  . meloxicam (MOBIC) 7.5 MG tablet Take 7.5 mg by mouth 2 (two) times daily.  5  . metFORMIN (GLUCOPHAGE) 500 MG tablet Take 1 tablet (500 mg total) by mouth daily with breakfast. 90 tablet 3  . mirtazapine (REMERON) 45 MG tablet Take 1 tablet (45 mg total) by mouth at bedtime. 30 tablet 1  . Prucalopride Succinate (MOTEGRITY) 2 MG TABS Take 2 mg by mouth daily. 30 tablet 1  . traMADol (ULTRAM) 50 MG tablet Take 50 mg by mouth 4 (four) times daily.     . vitamin C (ASCORBIC ACID) 500 MG tablet Take 500 mg by mouth daily.     Current Facility-Administered Medications  Medication Dose Route Frequency Provider Last Rate Last Dose  . 0.9 %  sodium chloride infusion  500 mL Intravenous Once Armbruster, Carlota Raspberry, MD         ALLERGIES: Trazodone and nefazodone  Family History  Problem Relation Age of Onset   . Diabetes Mother   . Hypertension Mother   . Hypertension Father   . Heart disease Father   . Early death Brother   . Kidney disease Maternal Grandmother   . Heart disease Brother   . Colon cancer Neg Hx   . Rectal cancer Neg Hx     Social History   Socioeconomic History  . Marital status: Single    Spouse name: Not on file  . Number of children: 1  . Years of education: Not on file  . Highest education level: Not on file  Occupational History  . Occupation: disablity  Social Needs  . Financial resource strain: Very hard  . Food insecurity:    Worry: Never true    Inability: Never true  . Transportation needs:    Medical: No    Non-medical: No  Tobacco Use  . Smoking status: Current Every Day Smoker    Packs/day: 0.25    Years: 30.00    Pack years: 7.50    Types: Cigarettes  . Smokeless tobacco: Never Used  Substance and Sexual Activity  . Alcohol use: No  . Drug use: No  . Sexual activity: Not Currently    Partners: Male    Birth control/protection: Post-menopausal  Lifestyle  . Physical activity:    Days per week: 0 days    Minutes per session: 0 min  . Stress: Rather much  Relationships  . Social connections:    Talks on phone: More than three times a week    Gets together: Once a week    Attends religious service: Never    Active member of club or organization: No    Attends meetings of clubs or organizations: Never    Relationship status: Never married  . Intimate partner violence:    Fear of current or ex partner: No    Emotionally abused: No    Physically abused: No    Forced sexual activity: No  Other Topics Concern  . Not on file  Social History Narrative  . Not on file    Review of Systems  Constitutional: Negative.   HENT: Negative.   Eyes: Negative.   Respiratory: Negative.   Cardiovascular: Negative.   Gastrointestinal: Negative.   Genitourinary:       Irregular menstrual cycle  Musculoskeletal: Negative.   Skin: Negative.    Neurological: Negative.   Endo/Heme/Allergies: Negative.   Psychiatric/Behavioral: Negative.   All  other systems reviewed and are negative.   PHYSICAL EXAMINATION:    LMP 05/19/2016 (Approximate)     General appearance: alert, cooperative and appears stated age Neck: no adenopathy, supple, symmetrical, trachea midline and thyroid {CHL AMB PHY EX THYROID NORM DEFAULT:[phone]::"normal to inspection and palpation"} Breasts: {Exam; breast:13139::"normal appearance, no masses or tenderness"} Abdomen: soft, non-tender; non distended, no masses,  no organomegaly  Pelvic: External genitalia:  no lesions              Urethra:  normal appearing urethra with no masses, tenderness or lesions              Bartholins and Skenes: normal                 Vagina: normal appearing vagina with normal color and discharge, no lesions              Cervix: {CHL AMB PHY EX CERVIX NORM DEFAULT:[phone]::"no lesions"}              Bimanual Exam:  Uterus:  {CHL AMB PHY EX UTERUS NORM DEFAULT:[phone]::"normal size, contour, position, consistency, mobility, non-tender"}              Adnexa: {CHL AMB PHY EX ADNEXA NO MASS DEFAULT:[phone]::"no mass, fullness, tenderness"}              Rectovaginal: {yes no:[id number]}.  Confirms.              Anus:  normal sphincter tone, no lesions  Chaperone was present for exam.  ASSESSMENT     PLAN    An After Visit Summary was printed and given to the patient.  *** minutes face to face time of which over 50% was spent in counseling.

## 2018-02-17 ENCOUNTER — Ambulatory Visit (HOSPITAL_COMMUNITY): Payer: Self-pay | Admitting: Licensed Clinical Social Worker

## 2018-02-17 ENCOUNTER — Telehealth: Payer: Self-pay | Admitting: Gastroenterology

## 2018-02-17 NOTE — Progress Notes (Signed)
GYNECOLOGY  VISIT   HPI: 52 y.o.   Single  African American  female   [phone] with Patient's last menstrual period was 05/19/2016 (approximate).   here for irregular menses.   In the spring the patient was evaluated for PMP bleeding. Inactive endometrium on biopsy, fibroid uterus, but negative sonohysterogram, AGUS pap, negative HPV, unsatisfactory colposcopy, benign ECC. Last [facility] was just in the menopausal range.  She was given a script for cyclic provera and is here to f/u. She took the provera in July and had a cycle. No cycle in May after taking provera, she took it last week and had no bleeding.   Since her last visit she was started on metformin for diabetes. She is struggling with depression, she is seeing a Museum/gallery conservator. She isn't sleeping well.  Her daughter was staying with her, but left which is hard. She is on disability. She just lost her 20 year job because she was missing too much work with her medical issues. She is applying for medicaid.   She has chronic pain. Having trouble functioning. She has a mobile tens machine, it is helping.   GYNECOLOGIC HISTORY: Patient's last menstrual period was 05/19/2016 (approximate). Contraception:none Menopausal hormone therapy: provera        OB History    Gravida  5   Para  1   Term  1   Preterm      AB  4   Living  1     SAB  3   TAB      Ectopic  1   Multiple      Live Births  1              Patient Active Problem List   Diagnosis Date Noted  . Chronic back pain   . Special screening for malignant neoplasms, colon 08/16/2017  . Glucose intolerance 02/13/2017  . Leg swelling 02/13/2017  . Chronic constipation 11/20/2016  . Hot flashes 11/20/2016  . Tear of MCL (medial collateral ligament) of knee, left, subsequent encounter 04/06/2016  . Anterolisthesis 04/06/2016  . Situational mixed anxiety and depressive disorder 02/26/2016  . Insomnia 02/24/2016  . Back pain with radiation  02/14/2016  . Grief reaction 02/14/2016  . Essential hypertension, benign 12/26/2015  . DDD (degenerative disc disease), lumbar 12/26/2015  . Seasonal allergies 12/26/2015  . Tobacco user 04/06/2015  . Obesity 04/06/2015  . Anemia   . Asthma   . Depression   . Hyperlipidemia     Past Medical History:  Diagnosis Date  . Anemia   . Anxiety   . Asthma   . Carpal tunnel syndrome   . Chronic back pain   . Depression   . History of multiple miscarriages   . Hyperlipidemia     Past Surgical History:  Procedure Laterality Date  . BREAST SURGERY  2000   reduction  . CESAREAN SECTION    . COSMETIC SURGERY  2000   breast reduction  . REDUCTION MAMMAPLASTY Bilateral     Current Outpatient Medications  Medication Sig Dispense Refill  . Biotin 5000 MCG CAPS Take 1 capsule by mouth.    . BYSTOLIC 10 MG tablet TAKE 1 TABLET BY MOUTH EVERY DAY 30 tablet 6  . cloNIDine (CATAPRES) 0.1 MG tablet Take 1 tablet (0.1 mg total) by mouth daily. In the morning. 30 tablet 2  . cloNIDine (CATAPRES) 0.2 MG tablet Take 1 tablet (0.2 mg total) by mouth daily. In the evening. 30 tablet  2  . Cyanocobalamin (VITAMIN B 12 PO) Take 1 tablet by mouth daily.     . cyclobenzaprine (FLEXERIL) 10 MG tablet Take 1 tablet (10 mg total) by mouth 3 (three) times daily as needed for muscle spasms. 30 tablet 0  . furosemide (LASIX) 20 MG tablet Take 1 tablet (20 mg total) by mouth daily. 30 tablet 6  . gabapentin (NEURONTIN) 300 MG capsule Take 600 mg by mouth 3 (three) times daily.    . hydrOXYzine (VISTARIL) 50 MG capsule Take 1 capsule (50 mg total) by mouth 3 (three) times daily as needed. 90 capsule 1  . Lidocaine-Hydrocortisone Ace 3-0.5 % CREA Apply topically twice per day. (Patient taking differently: daily as needed. Apply topically twice per day.) 30 g 0  . lithium carbonate (LITHOBID) 300 MG CR tablet Take 1 tablet (300 mg total) by mouth at bedtime. 30 tablet 1  . medroxyPROGESTERone (PROVERA) 5 MG  tablet Take 1 tablet a day for 5 days every other month (start 10/09/17, 12/09/17, 02/09/18) 15 tablet 0  . meloxicam (MOBIC) 7.5 MG tablet Take 7.5 mg by mouth 2 (two) times daily.  5  . metFORMIN (GLUCOPHAGE) 500 MG tablet Take 1 tablet (500 mg total) by mouth daily with breakfast. 90 tablet 3  . mirtazapine (REMERON) 45 MG tablet Take 1 tablet (45 mg total) by mouth at bedtime. 30 tablet 1  . Prucalopride Succinate (MOTEGRITY) 2 MG TABS Take 2 mg by mouth daily. 30 tablet 1  . traMADol (ULTRAM) 50 MG tablet Take 50 mg by mouth 4 (four) times daily.     . vitamin C (ASCORBIC ACID) 500 MG tablet Take 500 mg by mouth daily.     Current Facility-Administered Medications  Medication Dose Route Frequency Provider Last Rate Last Dose  . 0.9 %  sodium chloride infusion  500 mL Intravenous Once Armbruster, Carlota Raspberry, MD         ALLERGIES: Trazodone and nefazodone  Family History  Problem Relation Age of Onset  . Diabetes Mother   . Hypertension Mother   . Hypertension Father   . Heart disease Father   . Early death Brother   . Kidney disease Maternal Grandmother   . Heart disease Brother   . Colon cancer Neg Hx   . Rectal cancer Neg Hx     Social History   Socioeconomic History  . Marital status: Single    Spouse name: Not on file  . Number of children: 1  . Years of education: Not on file  . Highest education level: Not on file  Occupational History  . Occupation: disablity  Social Needs  . Financial resource strain: Very hard  . Food insecurity:    Worry: Never true    Inability: Never true  . Transportation needs:    Medical: No    Non-medical: No  Tobacco Use  . Smoking status: Current Every Day Smoker    Packs/day: 0.25    Years: 30.00    Pack years: 7.50    Types: Cigarettes  . Smokeless tobacco: Never Used  Substance and Sexual Activity  . Alcohol use: No  . Drug use: No  . Sexual activity: Not Currently    Partners: Male    Birth control/protection:  Post-menopausal  Lifestyle  . Physical activity:    Days per week: 0 days    Minutes per session: 0 min  . Stress: Rather much  Relationships  . Social connections:    Talks on phone: More  than three times a week    Gets together: Once a week    Attends religious service: Never    Active member of club or organization: No    Attends meetings of clubs or organizations: Never    Relationship status: Never married  . Intimate partner violence:    Fear of current or ex partner: No    Emotionally abused: No    Physically abused: No    Forced sexual activity: No  Other Topics Concern  . Not on file  Social History Narrative  . Not on file    Review of Systems  Constitutional: Negative.   HENT: Negative.   Eyes: Negative.   Respiratory: Negative.   Cardiovascular: Negative.   Gastrointestinal: Negative.   Genitourinary:       Irregular menses  Musculoskeletal: Negative.   Skin: Negative.   Neurological: Negative.   Endo/Heme/Allergies: Negative.   Psychiatric/Behavioral: Negative.   All other systems reviewed and are negative.   PHYSICAL EXAMINATION:    LMP 05/19/2016 (Approximate)     General appearance: alert, cooperative and appears stated age The patient was teary throughout her visit. Depressed affect.  She was here with her mother who was very supportive.  ASSESSMENT Perimenopausal, negative evaluation On cyclic provera, last bleed in July Depression, seeing a counselor and a Psychiatrist Chronic pain, contributing to her depression    PLAN Continue cyclic provera She will f/u with Psychiatry for her depression    An After Visit Summary was printed and given to the patient.  Over 40 minutes face to face time of which over 50% was spent in counseling.

## 2018-02-17 NOTE — Telephone Encounter (Signed)
Jan, it looks like this patient brought some paperwork here in August. Can you please help her out, you may want to check with Dottie. Thank you.

## 2018-02-18 ENCOUNTER — Ambulatory Visit: Payer: Self-pay | Admitting: Obstetrics and Gynecology

## 2018-02-19 ENCOUNTER — Encounter: Payer: Self-pay | Admitting: Obstetrics and Gynecology

## 2018-02-19 ENCOUNTER — Ambulatory Visit (INDEPENDENT_AMBULATORY_CARE_PROVIDER_SITE_OTHER): Payer: 59 | Admitting: Obstetrics and Gynecology

## 2018-02-19 VITALS — BP 142/80 | HR 87 | Ht 67.0 in | Wt 245.0 lb

## 2018-02-19 DIAGNOSIS — N951 Menopausal and female climacteric states: Secondary | ICD-10-CM

## 2018-02-19 DIAGNOSIS — N926 Irregular menstruation, unspecified: Secondary | ICD-10-CM

## 2018-02-19 MED ORDER — MEDROXYPROGESTERONE ACETATE 5 MG PO TABS: ORAL_TABLET | ORAL | 0 refills | Status: DC

## 2018-02-19 NOTE — Telephone Encounter (Signed)
I have spoken to patient to advise that forms for Melody Jenkins have been filled out and faxed to [phone]. She verbalizes understanding. I have also mailed a copy to her home address at her request.

## 2018-02-19 NOTE — Telephone Encounter (Signed)
Dr Armbruster-  Please advise... 

## 2018-02-19 NOTE — Telephone Encounter (Signed)
I completed the paperwork to be faxed back to them. Thanks for your help

## 2018-02-26 ENCOUNTER — Encounter (HOSPITAL_COMMUNITY): Payer: Self-pay | Admitting: Licensed Clinical Social Worker

## 2018-02-26 ENCOUNTER — Ambulatory Visit (HOSPITAL_COMMUNITY): Payer: 59 | Admitting: Licensed Clinical Social Worker

## 2018-02-26 DIAGNOSIS — F333 Major depressive disorder, recurrent, severe with psychotic symptoms: Secondary | ICD-10-CM | POA: Diagnosis not present

## 2018-02-26 NOTE — Progress Notes (Signed)
   THERAPIST PROGRESS NOTE  Session Time: 11:10-12pm  Participation Level: Active  Behavioral Response: Casual/Alert/Anxious/Depressed  Type of Therapy: Individual Therapy  Treatment Goals addressed: Improve psychiatric symptoms, Controlled Behavior, Moderated Mood, Improve Unhelpful Thought Patterns, Emotional Regulation Skills (Moderate moods, anger management, stress management), Feel and express a full Range of Emotions, Learn about Diagnosis, Healthy Coping Skills, Recall the Traumatic event without being overwhelmed,    Interventions: CBT  Summary: Melody Jenkins is a 52 y.o. female presents for her individual  counseling session. Pt discussed her psychiatric symptoms and current life events. Pt presents depressed today. She went to her fiance's grave to celebrate his birthday. It was emotional for pt. Asked open ended questions and used empathic reflection. Discussed relationship with her daughter. They are still not speaking to each other. This is upsetting to pt. Assisted pt processing her feelings. Pt became tearful in her processing her feelings about her fiance's death and her relationship with her daughter. Pt continues to work on her disability claim. Pt looked into getting the orange card since she no longer has medical insurance. Discussed benefits of orange card. Pt continues to use her mindfulness skills, encouraged pt to continue to use these skills.   Suicidal/Homicidal: Nowithout intent/plan  Therapist Response: Assessed pt's current functioning and reviewed progress. Assisted pt processing fiance's birthday, family relationships, mindfulness skills, orange card.  Assisted pt  processing for management of stressors.  Plan: see again in 2 weeks  Diagnosis: Axis I: MDD, recurrent episode moderate        MACKENZIE,LISBETH S, LCAS  02/26/18

## 2018-02-27 ENCOUNTER — Other Ambulatory Visit: Payer: Self-pay | Admitting: Obstetrics and Gynecology

## 2018-02-27 NOTE — Telephone Encounter (Signed)
Medication refill request: Provera Last seen 02/19/2018 Next AEX: 09/01/2018 Last MMG (if hormonal medication request): 01/28/2018 BI-RADS CATEGORY  1: Negative. Refill authorized: #5, 2 refills

## 2018-03-01 ENCOUNTER — Other Ambulatory Visit: Payer: Self-pay | Admitting: Family Medicine

## 2018-03-04 ENCOUNTER — Ambulatory Visit: Payer: 59 | Admitting: Obstetrics and Gynecology

## 2018-03-12 ENCOUNTER — Encounter (HOSPITAL_COMMUNITY): Payer: Self-pay | Admitting: Licensed Clinical Social Worker

## 2018-03-12 ENCOUNTER — Ambulatory Visit (HOSPITAL_COMMUNITY): Payer: 59 | Admitting: Licensed Clinical Social Worker

## 2018-03-12 DIAGNOSIS — F333 Major depressive disorder, recurrent, severe with psychotic symptoms: Secondary | ICD-10-CM

## 2018-03-12 NOTE — Progress Notes (Signed)
   THERAPIST PROGRESS NOTE  Session Time: 11:10-12pm  Participation Level: Active  Behavioral Response: Casual/Alert/Anxious/Depressed  Type of Therapy: Individual Therapy  Treatment Goals addressed: Improve psychiatric symptoms, Controlled Behavior, Moderated Mood, Improve Unhelpful Thought Patterns, Emotional Regulation Skills (Moderate moods, anger management, stress management), Feel and express a full Range of Emotions, Learn about Diagnosis, Healthy Coping Skills, Recall the Traumatic event without being overwhelmed,    Interventions: CBT  Summary: Melody Jenkins is a 52 y.o. female presents for her individual  counseling session. Pt discussed her psychiatric symptoms and current life events. Pt presents depressed today. The anniversary of her fiance's passing is tomorrow. Processed with pt her feelings. She expressed her feelings of sadness, grief, what could have been.Marland KitchenMarland KitchenShe talked about what she wants to do tomorrow to celebrate his life. Pt continues to struggle with sleep. Discussed sleep hygiene with pt. Pt is going with her mother and daughter to the beach next weekend. She and her daughter are beginning to have a relationship again. Pt has acquired a TINS machine for her back pain and it is helping immensely. Pt has her medicaid hearing Monday.     Suicidal/Homicidal: Nowithout intent/plan  Therapist Response: Assessed pt's current functioning and reviewed progress. Assisted pt processing fiance's death anniversary, family relationships, grief, celebration.  Assisted pt  processing for management of stressors.  Plan: see again in 2 weeks  Diagnosis: Axis I: MDD, recurrent episode moderate        MACKENZIE,LISBETH S, LCAS  03/12/18

## 2018-03-20 ENCOUNTER — Ambulatory Visit (HOSPITAL_COMMUNITY): Payer: Self-pay | Admitting: Psychiatry

## 2018-03-26 ENCOUNTER — Ambulatory Visit (HOSPITAL_COMMUNITY): Payer: Self-pay | Admitting: Licensed Clinical Social Worker

## 2018-03-27 ENCOUNTER — Ambulatory Visit (INDEPENDENT_AMBULATORY_CARE_PROVIDER_SITE_OTHER): Payer: Medicaid Other | Admitting: Psychiatry

## 2018-03-27 ENCOUNTER — Encounter (HOSPITAL_COMMUNITY): Payer: Self-pay | Admitting: Psychiatry

## 2018-03-27 VITALS — BP 162/102 | HR 64 | Resp 20 | Ht 67.5 in | Wt 245.6 lb

## 2018-03-27 DIAGNOSIS — F4310 Post-traumatic stress disorder, unspecified: Secondary | ICD-10-CM | POA: Diagnosis not present

## 2018-03-27 DIAGNOSIS — F333 Major depressive disorder, recurrent, severe with psychotic symptoms: Secondary | ICD-10-CM | POA: Diagnosis not present

## 2018-03-27 DIAGNOSIS — G4701 Insomnia due to medical condition: Secondary | ICD-10-CM | POA: Diagnosis not present

## 2018-03-27 DIAGNOSIS — F411 Generalized anxiety disorder: Secondary | ICD-10-CM

## 2018-03-27 MED ORDER — HYDROXYZINE PAMOATE 50 MG PO CAPS: 50.0000 mg | ORAL_CAPSULE | Freq: Three times a day (TID) | ORAL | 2 refills | Status: DC | PRN

## 2018-03-27 MED ORDER — CLONIDINE HCL 0.1 MG PO TABS: 0.1000 mg | ORAL_TABLET | Freq: Every day | ORAL | 2 refills | Status: DC

## 2018-03-27 MED ORDER — LITHIUM CARBONATE ER 300 MG PO TBCR: 300.0000 mg | EXTENDED_RELEASE_TABLET | Freq: Every day | ORAL | 2 refills | Status: DC

## 2018-03-27 MED ORDER — CLONIDINE HCL 0.2 MG PO TABS: 0.2000 mg | ORAL_TABLET | Freq: Every day | ORAL | 2 refills | Status: DC

## 2018-03-27 MED ORDER — MIRTAZAPINE 45 MG PO TABS: 45.0000 mg | ORAL_TABLET | Freq: Every day | ORAL | 2 refills | Status: DC

## 2018-03-27 NOTE — Progress Notes (Signed)
[facility] MD/PA/NP OP Progress Note  03/27/2018 2:12 PM Melody Jenkins  MRN:  [id number]  Chief Complaint:  Chief Complaint    Depression; Anxiety; Trauma     HPI: Patient has a TENS unit for her back pain.  He does not really seem to be helping much and she has been referred for back surgery.  She does not feel that she is mentally in the state to get it done.  September and October are particularly difficult months to 2 the anniversary of her husband's death and his birthday.  She has been sad and tearful but she is trying to move forward.  She went to visit his grave and fell asleep for 1 hour while sitting there.  It was very peaceful sleep and a surprise to her as her sleep has been poor for a very long time.  Her mother has been coming over about twice a week.  She takes the patient out and they run some errands and eat together.  Her mother also helps to decrease the patient's anxiety by going through her mail.  She talks to her mother on the phone for 1 hour a day.  She does have some friends who she keeps in regular contact with.  [name] notes that she is had some improvement since her son's passing but knows that she needs to find a way to move on.  She denies SI/HI.  She has been taking her medications as prescribed and denies side effects..    Visit Diagnosis:    ICD-10-CM   1. 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 Anxiety state F41.1 cloNIDine (CATAPRES) 0.1 MG tablet    cloNIDine (CATAPRES) 0.2 MG tablet  3. PTSD (post-traumatic stress disorder) F43.10 hydrOXYzine (VISTARIL) 50 MG capsule  4. Insomnia due to medical condition G47.01 mirtazapine (REMERON) 45 MG tablet      Past Psychiatric History:  Patient endorse history of depression 20 years ago when she was given Lexapro by her primary care physician. She remember a good response from the Lexapro.  Patient denies any history of psychiatric inpatient treatment, psychosis, suicidal attempt, mania, hallucination, OCD symptoms.   Previous Psychotropic Medications:Lexapro, Effexor, Vistaril-ineffective; Pristiq- helpful but increased BP  Substance Abuse History in the last 12 months: No.  Consequences of Substance Abuse: Patient endorse history of drinking alcohol on the weekends and get some time intoxicated but denies any history of seizures, tremors, blackouts or any withdrawal symptoms.    Past Medical History:  Past Medical History:  Diagnosis Date  . Anemia   . Anxiety   . Asthma   . Carpal tunnel syndrome   . Chronic back pain   . Depression   . History of multiple miscarriages   . Hyperlipidemia     Past Surgical History:  Procedure Laterality Date  . BREAST SURGERY  2000   reduction  . CESAREAN SECTION    . COSMETIC SURGERY  2000   breast reduction  . REDUCTION MAMMAPLASTY Bilateral     Family Psychiatric History:  Family History  Problem Relation Age of Onset  . Diabetes Mother   . Hypertension Mother   . Hypertension Father   . Heart disease Father   . Early death Brother   . Kidney disease Maternal Grandmother   . Heart disease Brother   . Colon cancer Neg Hx   . Rectal cancer Neg  Hx     Social History:  Social History   Socioeconomic History  . Marital status: Single    Spouse name: Not on file  . Number of children: 1  . Years of education: Not on file  . Highest education level: Not on file  Occupational History  . Occupation: disablity  Social Needs  . Financial resource strain: Very hard  . Food insecurity:    Worry: Never true    Inability: Never true  . Transportation needs:    Medical: No    Non-medical: No  Tobacco Use  . Smoking status: Current Every Day Smoker    Packs/day: 0.25    Years: 30.00    Pack years: 7.50    Types: Cigarettes  . Smokeless tobacco: Never Used  Substance and Sexual Activity  . Alcohol use:  No  . Drug use: No  . Sexual activity: Not Currently    Partners: Male    Birth control/protection: Post-menopausal  Lifestyle  . Physical activity:    Days per week: 0 days    Minutes per session: 0 min  . Stress: Rather much  Relationships  . Social connections:    Talks on phone: More than three times a week    Gets together: Once a week    Attends religious service: Never    Active member of club or organization: No    Attends meetings of clubs or organizations: Never    Relationship status: Never married  Other Topics Concern  . Not on file  Social History Narrative  . Not on file    Allergies:  Allergies  Allergen Reactions  . Trazodone And Nefazodone Hives    Metabolic Disorder Labs: Lab Results  Component Value Date   HGBA1C 6.4 (H) 01/13/2018   MPG 137 01/13/2018   MPG 134 09/13/2017   No results found for: PROLACTIN Lab Results  Component Value Date   CHOL 187 01/13/2018   TRIG 333 (H) 01/13/2018   HDL 32 (L) 01/13/2018   CHOLHDL 5.8 (H) 01/13/2018   VLDL 29 03/05/2016   LDLCALC 111 (H) 01/13/2018   LDLCALC 136 (H) 09/13/2017   Lab Results  Component Value Date   TSH 1.13 01/13/2018   TSH 0.83 11/07/2016    Therapeutic Level Labs: No results found for: LITHIUM No results found for: VALPROATE No components found for:  CBMZ  Current Medications: Current Outpatient Medications  Medication Sig Dispense Refill  . Biotin 5000 MCG CAPS Take 1 capsule by mouth.    . BYSTOLIC 10 MG tablet TAKE 1 TABLET BY MOUTH EVERY DAY 30 tablet 6  . cloNIDine (CATAPRES) 0.1 MG tablet Take 1 tablet (0.1 mg total) by mouth daily. In the morning. 30 tablet 2  . cloNIDine (CATAPRES) 0.2 MG tablet Take 1 tablet (0.2 mg total) by mouth daily. In the evening. 30 tablet 2  . Cyanocobalamin (VITAMIN B 12 PO) Take 1 tablet by mouth daily.     . cyclobenzaprine (FLEXERIL) 10 MG tablet Take 1 tablet (10 mg total) by mouth 3 (three) times daily as needed for muscle spasms. 30  tablet 0  . furosemide (LASIX) 20 MG tablet TAKE 1 TABLET BY MOUTH EVERY DAY 30 tablet 6  . gabapentin (NEURONTIN) 300 MG capsule Take 600 mg by mouth 3 (three) times daily.    . hydrOXYzine (VISTARIL) 50 MG capsule Take 1 capsule (50 mg total) by mouth 3 (three) times daily as needed. 90 capsule 2  . Lidocaine-Hydrocortisone Ace 3-0.5 %  CREA Apply topically twice per day. (Patient taking differently: daily as needed. Apply topically twice per day.) 30 g 0  . lithium carbonate (LITHOBID) 300 MG CR tablet Take 1 tablet (300 mg total) by mouth at bedtime. 30 tablet 2  . medroxyPROGESTERone (PROVERA) 5 MG tablet Take 1 tablet a day for 5 days every other month (start 10/09/17, 12/09/17, 02/09/18) 15 tablet 0  . medroxyPROGESTERone (PROVERA) 5 MG tablet TAKE 1 TABLET BY MOUTH DAILY FOR 5 DAYS EVERY OTHER MONTH (START 10/09/17, 12/09/17, 02/09/18) 5 tablet 2  . meloxicam (MOBIC) 7.5 MG tablet Take 7.5 mg by mouth 2 (two) times daily.  5  . metFORMIN (GLUCOPHAGE) 500 MG tablet Take 1 tablet (500 mg total) by mouth daily with breakfast. 90 tablet 3  . mirtazapine (REMERON) 45 MG tablet Take 1 tablet (45 mg total) by mouth at bedtime. 30 tablet 2  . Prucalopride Succinate (MOTEGRITY) 2 MG TABS Take 2 mg by mouth daily. 30 tablet 1  . traMADol (ULTRAM) 50 MG tablet Take 50 mg by mouth 4 (four) times daily.     . vitamin C (ASCORBIC ACID) 500 MG tablet Take 500 mg by mouth daily.     Current Facility-Administered Medications  Medication Dose Route Frequency Provider Last Rate Last Dose  . 0.9 %  sodium chloride infusion  500 mL Intravenous Once Armbruster, Carlota Raspberry, MD         Musculoskeletal: Strength & Muscle Tone: within normal limits Gait & Station: broad based, walking with cane Patient leans: N/A   Psychiatric Specialty Exam: Review of Systems  Musculoskeletal: Positive for back pain, myalgias and neck pain. Negative for joint pain.  Neurological: Positive for dizziness and headaches. Negative for  tremors and speech change.    Blood pressure (!) 162/102, pulse 64, resp. rate 20, height 5' 7.5" (1.715 m), weight 245 lb 9.6 oz (111.4 kg).Body mass index is 37.9 kg/m.  General Appearance: Fairly Groomed  Eye Contact:  Good  Speech:  Clear and Coherent and Normal Rate  Volume:  Normal  Mood:  Depressed  Affect:  Congruent and brighter than previous visits  Thought Process:  Coherent and Descriptions of Associations: Circumstantial  Orientation:  Full (Time, Place, and Person)  Thought Content:  Rumination  Suicidal Thoughts:  No  Homicidal Thoughts:  No  Memory:  Immediate;   Good  Judgement:  Good  Insight:  Good  Psychomotor Activity:  Normal  Concentration:  Concentration: Good  Recall:  Good  Fund of Knowledge:  Good  Language:  Good  Akathisia:  No  Handed:  Right  AIMS (if indicated):     Assets:  Communication Skills Desire for Improvement Housing Social Support Transportation  ADL's:  Intact  Cognition:  WNL  Sleep:   poor       Screenings: GAD-7     Office Visit from 02/06/2016 in [place]  Total GAD-7 Score  21    PHQ2-9     Office Visit from 01/13/2018 in [place] Office Visit from 09/13/2017 in [place] Office Visit from 08/06/2017 in [place] Office Visit from 05/15/2017 in [facility] Visit from 02/13/2017 in [place]  PHQ-2 Total Score  4  0  5  5  4   PHQ-9 Total Score  17  0  18  21  18       Reviewed the information below on 03/27/2018 and have updated it  Assessment and Plan: MDD with anxiety, recurrent, severe without psychotic features; PTSD; insomnia    Medication management with supportive therapy. Risks and benefits, side effects and alternative treatment options discussed with patient. Pt was given an opportunity to ask questions about medication, illness, and treatment. All current psychiatric medications have been reviewed  and discussed with the patient and adjusted as clinically appropriate. The patient has been provided an accurate and updated list of the medications being now prescribed. Patient expressed understanding of how their medications were to be used.  Pt verbalized understanding and verbal consent obtained for treatment.  The risk of un-intended pregnancy is low based on the fact that pt reports she is postmenopausal. Pt is aware that these meds carry a teratogenic risk. Pt will discuss plan of action if she does or plans to become pregnant in the future.  Status of current problems: improvement in depression  Meds: Vistaril 50 mg p.o. 3 times daily as needed anxiety as related to depression and PTSD Remeron 45 mg p.o. nightly for MDD, PTSD and insomnia Clonidine 0.1 mg p.o. qAM and  0.2mg  qPM (prescribed by PCP) as needed anxiety- pt denies SE Lithium ER 300mg  po qHS due to ongoing depression symptoms Patient is on Bystolic and Neurontin as prescribed by PCP both have been off label indication for anxiety   Labs: reviewed labs done 01/13/2018 glu 122, lipids elevated, HbA1c 6.4, TSH wnl  Therapy: brief supportive therapy provided. Discussed psychosocial stressors in detail.   Encouraged pt to develop daily routine and work on daily goal setting as a way to improve mood symptoms.   Consultations: Encouraged to follow up with therapist Encouraged to follow up with PCP as needed-she knows that her blood pressure is elevated whenever she is in pain and is working with her PCP  Pt denies SI and is at an acute low risk for suicide. Patient told to call clinic if any problems occur. Patient advised to go to ER if they should develop SI/HI, side effects, or if symptoms worsen. Has crisis numbers to call if needed. Pt verbalized understanding.  F/up in 2 months or sooner if needed    Charlcie Cradle, MD 03/27/2018, 2:12 PM

## 2018-04-01 ENCOUNTER — Ambulatory Visit (HOSPITAL_COMMUNITY): Payer: 59 | Admitting: Licensed Clinical Social Worker

## 2018-04-01 ENCOUNTER — Ambulatory Visit (INDEPENDENT_AMBULATORY_CARE_PROVIDER_SITE_OTHER): Payer: Medicaid Other | Admitting: Licensed Clinical Social Worker

## 2018-04-01 ENCOUNTER — Encounter (HOSPITAL_COMMUNITY): Payer: Self-pay | Admitting: Licensed Clinical Social Worker

## 2018-04-01 DIAGNOSIS — F333 Major depressive disorder, recurrent, severe with psychotic symptoms: Secondary | ICD-10-CM

## 2018-04-01 NOTE — Progress Notes (Signed)
   THERAPIST PROGRESS NOTE  Session Time: 11:10-12pm  Participation Level: Active  Behavioral Response: Casual/Alert/Anxious/Depressed  Type of Therapy: Individual Therapy  Treatment Goals addressed: Improve psychiatric symptoms, Controlled Behavior, Moderated Mood, Improve Unhelpful Thought Patterns, Emotional Regulation Skills (Moderate moods, anger management, stress management), Feel and express a full Range of Emotions, Learn about Diagnosis, Healthy Coping Skills, Recall the Traumatic event without being overwhelmed,    Interventions: CBT  Summary: Melody Jenkins is a 52 y.o. female presents for her individual  counseling session. Pt discussed her psychiatric symptoms and current life events. Pt presents in pain today. She experiences chronic pain everyday. She reports she had an "aniety attack" yesterday because she received an email from her attorney who is prepared to move forward with the lawsuit from her fall 6/17. Pt described her feelings about the finality of case being resolved and how her life has changed in the past 2+ years. She also shared she had her medicaid hearing and should get the results within another week. She shared the hearing was difficult and felt she must have blacked out from the pain because she doesn't remember the end of the hearing; her mother was with her. She struggles with her pain which manifests into anxiety which then manifests to depression. Continue educating pt on dealing with her chronic pain. Today discussed finding a focal point and to breathe through the pain. Practiced in session.       Suicidal/Homicidal: Nowithout intent/plan  Therapist Response: Assessed pt's current functioning and reviewed progress. Assisted pt processing pending lawsuit, medicaid hearing, coping with chronic pain  .  Assisted pt  processing for management of stressors.  Plan: see again in 2 weeks  Diagnosis: Axis I: MDD, recurrent severe with  pspychosis        [place], LCAS  04/01/18

## 2018-04-09 ENCOUNTER — Encounter (HOSPITAL_COMMUNITY): Payer: Self-pay | Admitting: Licensed Clinical Social Worker

## 2018-04-09 ENCOUNTER — Ambulatory Visit (INDEPENDENT_AMBULATORY_CARE_PROVIDER_SITE_OTHER): Payer: 59 | Admitting: Licensed Clinical Social Worker

## 2018-04-09 DIAGNOSIS — F333 Major depressive disorder, recurrent, severe with psychotic symptoms: Secondary | ICD-10-CM | POA: Diagnosis not present

## 2018-04-09 NOTE — Progress Notes (Signed)
   THERAPIST PROGRESS NOTE  Session Time: 11:10-12pm  Participation Level: Active  Behavioral Response: Casual/Alert/Anxious/Depressed  Type of Therapy: Individual Therapy  Treatment Goals addressed: Improve psychiatric symptoms, Controlled Behavior, Moderated Mood, Improve Unhelpful Thought Patterns, Emotional Regulation Skills (Moderate moods, anger management, stress management), Feel and express a full Range of Emotions, Learn about Diagnosis, Healthy Coping Skills Recall the Traumatic event without being overwhelmed,    Interventions: CBT  Summary: Melody Jenkins is a 52 y.o. female presents for her individual  counseling session with her mother. Pt discussed her psychiatric symptoms and current life events. Pt presents anxious and stressed today. She had just left her attorney's office. You was physically in pain and tearful. Her mother shared it was difficult for her daughter to talk to the attorney today about her fall and subsequent chronic pain. Asked open ended questions. Pt reported she got upset and "stuck" with the attorney. Mother reiterated. Discussed with pt what stuck meant. Was pt upset because he was not telling her what she wanted to hear or disagreeing with her. Pt was agitated. Used breathing exercises to calm pt. Pt was receptive. Pt shared she received medicaid that is retroactive 7/18. Discussed the positives surrounding this as pt has been chronically stressed about mounting bills.         Suicidal/Homicidal: Nowithout intent/plan  Therapist Response: Assessed pt's current functioning and reviewed progress. Assisted pt processing pending lawsuit, medicaid, meeting with attorney.  Assisted pt  processing for management of stressors.  Plan: see again in 2 weeks  Diagnosis: Axis I: MDD, recurrent severe with psychosis        Zayn Selley S, LCAS  04/09/18

## 2018-04-23 ENCOUNTER — Encounter: Payer: Self-pay | Admitting: Family Medicine

## 2018-04-23 ENCOUNTER — Encounter: Payer: Self-pay | Admitting: Obstetrics and Gynecology

## 2018-04-24 ENCOUNTER — Ambulatory Visit (INDEPENDENT_AMBULATORY_CARE_PROVIDER_SITE_OTHER): Payer: Medicaid Other | Admitting: Licensed Clinical Social Worker

## 2018-04-24 ENCOUNTER — Encounter (HOSPITAL_COMMUNITY): Payer: Self-pay | Admitting: Licensed Clinical Social Worker

## 2018-04-24 DIAGNOSIS — F333 Major depressive disorder, recurrent, severe with psychotic symptoms: Secondary | ICD-10-CM | POA: Diagnosis not present

## 2018-04-24 NOTE — Progress Notes (Signed)
   THERAPIST PROGRESS NOTE  Session Time: 11:10-12pm  Participation Level: Active  Behavioral Response: Casual/Alert/Anxious  Type of Therapy: Individual Therapy  Treatment Goals addressed: Improve psychiatric symptoms, Controlled Behavior, Moderated Mood, Improve Unhelpful Thought Patterns, Emotional Regulation Skills (Moderate moods, anger management, stress management), Feel and express a full Range of Emotions, Learn about Diagnosis, Healthy Coping Skills.     Interventions: CBT  Summary: Melody Jenkins is a 52 y.o. female presents for her individual  counseling session. Pt discussed her psychiatric symptoms and current life events. Pt met with Dr. Doyne Keel last month and feels her meds are working ok. She still has quite a bit anxiety with some depressive moments. Pt's daughter has moved back in. As suggested, pt had her sign a rental agreement with expectations. Pt expressed their relationship seems to be improving. Asked open ended questions. Asissted pt identifying goals she wants for her daughter to complete while living with her, to make herself self reliant. Pt continues to have memory issues and worries she may have early on-set dementia or alzheimer's. If so, asked pt would it make her feel better if she saw a neurologist who may suggest that it is the stress she is currently under. Now that pt has medicaid, she may follow through with that suggestion. Pt continues to have chronic pain and during session she winced from sharp shooting pains repeatedly. Continue to suggest coping skills for her chronic pain. Continue to discuss with pt the importance of self care and surrounding herself with positive people.   Suicidal/Homicidal: Nowithout intent/plan  Therapist Response: Assessed pt's current functioning and reviewed progress. Assisted pt processing pending daughter moving back in, chronic pain, importance of self care, memory issues.  Assisted pt  processing for management of  stressors.  Plan: see again in 2 weeks  Diagnosis: Axis I: MDD, recurrent severe with psychosis        Yassmine Tamm S, LCAS  04/24/18

## 2018-04-29 ENCOUNTER — Other Ambulatory Visit: Payer: Self-pay

## 2018-04-29 ENCOUNTER — Ambulatory Visit (INDEPENDENT_AMBULATORY_CARE_PROVIDER_SITE_OTHER): Payer: Medicaid Other | Admitting: Family Medicine

## 2018-04-29 ENCOUNTER — Encounter: Payer: Self-pay | Admitting: Family Medicine

## 2018-04-29 VITALS — BP 122/80 | HR 68 | Temp 98.1°F | Resp 16 | Ht 67.0 in | Wt 244.0 lb

## 2018-04-29 DIAGNOSIS — Z6838 Body mass index (BMI) 38.0-38.9, adult: Secondary | ICD-10-CM

## 2018-04-29 DIAGNOSIS — E7439 Other disorders of intestinal carbohydrate absorption: Secondary | ICD-10-CM | POA: Diagnosis not present

## 2018-04-29 DIAGNOSIS — E782 Mixed hyperlipidemia: Secondary | ICD-10-CM

## 2018-04-29 DIAGNOSIS — Z23 Encounter for immunization: Secondary | ICD-10-CM

## 2018-04-29 DIAGNOSIS — I10 Essential (primary) hypertension: Secondary | ICD-10-CM | POA: Diagnosis not present

## 2018-04-29 NOTE — Progress Notes (Signed)
   Subjective:    Patient ID: Melody Jenkins, female    DOB: October 05, 1965, 52 y.o.   MRN: [id number]  Patient presents for Follow-up (is fasting)   Pt here to f/u chronic medical problems   Medications reviewed  Continues to see psychiatry/therapist/ pain physician  She recently received Medicaid which will help back pay- will need a form completed to continue being seen at our office   Borderline DM- last A1C  6.4%, has lost 7lbs intentionally, to improve sugar and cholesterol, TG were very high She is on metformin 500mg  once a day   HTN- taking BP meds as prescribed   Quit smoking 1  Month ago-  using nicoderm gum  Still gets quite stressed out, anxious and depressed, taking her meds, has memory problems, occ gets headaches, has confusion all typically stress related. Her therapist helps talk her through these episodes  Daughter did move back in which is been significant help to her.  Her getting her insurance has been no significant relief   Review Of Systems:  GEN- denies fatigue, fever, weight loss,weakness, recent illness HEENT- denies eye drainage, change in vision, nasal discharge, CVS- denies chest pain, palpitations RESP- denies SOB, cough, wheeze ABD- denies N/V, change in stools, abd pain GU- denies dysuria, hematuria, dribbling, incontinence MSK-+ joint pain, muscle aches, injury Neuro- denies headache, dizziness, syncope, seizure activity       Objective:    BP 122/80   Pulse 68   Temp 98.1 F (36.7 C) (Oral)   Resp 16   Ht 5\' 7"  (1.702 m)   Wt 244 lb (110.7 kg)   SpO2 98%   BMI 38.22 kg/m  GEN- NAD, alert and oriented x3 HEENT- PERRL, EOMI, non injected sclera, pink conjunctiva, MMM, oropharynx clear Neck- Supple, no thyromegaly CVS- RRR, no murmur RESP-CTAB ABD-NABS,soft,NT,ND Psych- not anxious appearing, smiling today, no SI, well groomed EXT- No edema Pulses- Radial, DP- 2+        Assessment & Plan:      Problem List Items Addressed This  Visit      Unprioritized   Essential hypertension, benign - Primary    Pressure is much improved.  Continue to work on dietary changes continue with her weight loss and she is tobacco free at this time.      Relevant Orders   Comprehensive metabolic panel   Glucose intolerance    Check her A1c suspect that this has improved with the use of the metformin and her weight loss.  Also keep her below 6.5%      Relevant Orders   Hemoglobin A1c   Hyperlipidemia    Triglycerides were elevated at the last check.  We will recheck her lipids today.      Relevant Orders   Lipid panel   Obesity      Note: This dictation was prepared with Dragon dictation along with smaller phrase technology. Any transcriptional errors that result from this process are unintentional.

## 2018-04-29 NOTE — Assessment & Plan Note (Addendum)
Check her A1c suspect that this has improved with the use of the metformin and her weight loss.  Also keep her below 6.5%

## 2018-04-29 NOTE — Patient Instructions (Signed)
F/U 6 month for Physical

## 2018-04-29 NOTE — Assessment & Plan Note (Signed)
Triglycerides were elevated at the last check.  We will recheck her lipids today.

## 2018-04-29 NOTE — Assessment & Plan Note (Signed)
Pressure is much improved.  Continue to work on dietary changes continue with her weight loss and she is tobacco free at this time.

## 2018-04-30 LAB — LIPID PANEL
CHOL/HDL RATIO: 6.4 (calc) — AB (ref <5.0)
Cholesterol: 236 mg/dL — ABNORMAL HIGH (ref <200)
HDL: 37 mg/dL — AB (ref >50)
LDL Cholesterol (Calc): 169 mg/dL (calc) — ABNORMAL HIGH
NON-HDL CHOLESTEROL (CALC): 199 mg/dL — AB (ref <130)
TRIGLYCERIDES: 154 mg/dL — AB (ref <150)

## 2018-04-30 LAB — COMPREHENSIVE METABOLIC PANEL
AG RATIO: 1.4 (calc) (ref 1.0–2.5)
ALT: 17 U/L (ref 6–29)
AST: 15 U/L (ref 10–35)
Albumin: 4 g/dL (ref 3.6–5.1)
Alkaline phosphatase (APISO): 76 U/L (ref 33–130)
BUN: 14 mg/dL (ref 7–25)
CO2: 27 mmol/L (ref 20–32)
CREATININE: 0.97 mg/dL (ref 0.50–1.05)
Calcium: 9.5 mg/dL (ref 8.6–10.4)
Chloride: 103 mmol/L (ref 98–110)
GLOBULIN: 2.9 g/dL (ref 1.9–3.7)
Glucose, Bld: 130 mg/dL — ABNORMAL HIGH (ref 65–99)
Potassium: 4.2 mmol/L (ref 3.5–5.3)
SODIUM: 140 mmol/L (ref 135–146)
TOTAL PROTEIN: 6.9 g/dL (ref 6.1–8.1)
Total Bilirubin: 0.4 mg/dL (ref 0.2–1.2)

## 2018-04-30 LAB — HEMOGLOBIN A1C
Hgb A1c MFr Bld: 6.2 % of total Hgb — ABNORMAL HIGH (ref <5.7)
MEAN PLASMA GLUCOSE: 131 (calc)
eAG (mmol/L): 7.3 (calc)

## 2018-05-06 ENCOUNTER — Ambulatory Visit (INDEPENDENT_AMBULATORY_CARE_PROVIDER_SITE_OTHER): Payer: Medicaid Other | Admitting: Licensed Clinical Social Worker

## 2018-05-06 ENCOUNTER — Encounter (HOSPITAL_COMMUNITY): Payer: Self-pay | Admitting: Licensed Clinical Social Worker

## 2018-05-06 DIAGNOSIS — F333 Major depressive disorder, recurrent, severe with psychotic symptoms: Secondary | ICD-10-CM | POA: Diagnosis not present

## 2018-05-06 NOTE — Progress Notes (Signed)
   THERAPIST PROGRESS NOTE  Session Time: 11:10-12pm  Participation Level: Active  Behavioral Response: Casual/Alert/Anxious  Type of Therapy: Individual Therapy  Treatment Goals addressed: Improve psychiatric symptoms, Controlled Behavior, Moderated Mood, Improve Unhelpful Thought Patterns, Emotional Regulation Skills (Moderate moods, anger management, stress management), Feel and express a full Range of Emotions, Learn about Diagnosis, Healthy Coping Skills.     Interventions: CBT  Summary: Melody Jenkins is a 52 y.o. female presents for her individual  counseling session. Pt discussed her psychiatric symptoms and current life events. Pt presents anxious today. She has decided to cook thanksgiving for 30 people. Asked open ended questions. Assisted pt problem solving cooking, baking, serving, cleaning, cleaning up all with chronic severe back pain. Pt reports she has started having headaches daily on 1 side of her head. Suggested that pt contact her doc with this information. Asked open ended questions about medication changes and bp. Pt continues waiting for information from her attorney about her lawsuit. Pt has contacted all her medical providers informing them she now has Medicaid. This knowledge has decreased some of the stress patient continues to experience. Pt and her daughter has moved back in and pt reports things are going well, so far. Still continue to encourage pt to see a neurologist for her memory issues as she is disturbed about her memory.Continue to discuss with pt the importance of self care and surrounding herself with positive people.   Suicidal/Homicidal: Nowithout intent/plan  Therapist Response: Assessed pt's current functioning and reviewed progress. Assisted pt processing cooking for 30 realistically,daughter,, chronic pain, importance of self care, memory issues.  Assisted pt  processing for management of stressors.  Plan: see again in 2 weeks  Diagnosis: Axis  I: MDD, recurrent severe with psychosis        MACKENZIE,LISBETH S, LCAS  05/06/18

## 2018-05-07 ENCOUNTER — Ambulatory Visit (HOSPITAL_COMMUNITY): Payer: Self-pay | Admitting: Licensed Clinical Social Worker

## 2018-05-13 ENCOUNTER — Telehealth: Payer: Self-pay | Admitting: Gastroenterology

## 2018-05-13 NOTE — Telephone Encounter (Signed)
Called [facility] now [place]. Spoke to [place]. Patient was approved for Patient Assistance in July 2019 thru 01-09-2019. She still has 3 refills remaining and they can refill 1 today. Pt will receive in a week to 10 days. [phone].   Called and spoke to pt.  She is aware and expressed understanding and appreciation.

## 2018-05-21 ENCOUNTER — Encounter (HOSPITAL_COMMUNITY): Payer: Self-pay | Admitting: Licensed Clinical Social Worker

## 2018-05-21 ENCOUNTER — Ambulatory Visit (INDEPENDENT_AMBULATORY_CARE_PROVIDER_SITE_OTHER): Payer: Medicaid Other | Admitting: Licensed Clinical Social Worker

## 2018-05-21 DIAGNOSIS — F333 Major depressive disorder, recurrent, severe with psychotic symptoms: Secondary | ICD-10-CM

## 2018-05-21 NOTE — Progress Notes (Signed)
   THERAPIST PROGRESS NOTE  Session Time: 11:10-12pm  Participation Level: Active  Behavioral Response: Casual/Alert/Depressed   Type of Therapy: Individual Therapy  Treatment Goals addressed: Improve psychiatric symptoms, Controlled Behavior, Moderated Mood, Improve Unhelpful Thought Patterns, Emotional Regulation Skills (Moderate moods, anger management, stress management), Feel and express a full Range of Emotions, Learn about Diagnosis, Healthy Coping Skills.     Interventions: CBT  Summary: Melody Jenkins is a 52 y.o. female presents for her individual  counseling session. Pt discussed her psychiatric symptoms and current life events. Pt presents depressed today. She has had a "falling out" with a group of friends over Thanksgiving holiday. Her feelings are hurt. Discussed the patient's relationship circle and the requirements to be in her inner circle. Pt is struggling with how to move forward without this specific group of friends in her circle. Pt was wearing a brace on her left hand and will be having carpel tunnel surgery on that hand soon. Pt has not heard anything from her settlement nor anything from her disability hearing schedule. Processed with pt how to move forward waiting on this 2 major settlements.       Suicidal/Homicidal: Nowithout intent/plan  Therapist Response: Assessed pt's current functioning and reviewed progress. Assisted pt processing relationship circles, carpal tunnel surgery, settlement, disability hearing.  Assisted pt  processing for management of stressors.  Plan: see again in 2 weeks  Diagnosis: Axis I: MDD, recurrent severe with psychosis        Mahlet Jergens S, LCAS  05/21/18

## 2018-05-22 ENCOUNTER — Encounter: Payer: Self-pay | Admitting: Family Medicine

## 2018-05-29 ENCOUNTER — Ambulatory Visit (INDEPENDENT_AMBULATORY_CARE_PROVIDER_SITE_OTHER): Payer: Medicaid Other | Admitting: Psychiatry

## 2018-05-29 ENCOUNTER — Encounter (HOSPITAL_COMMUNITY): Payer: Self-pay | Admitting: Psychiatry

## 2018-05-29 DIAGNOSIS — F4310 Post-traumatic stress disorder, unspecified: Secondary | ICD-10-CM | POA: Diagnosis not present

## 2018-05-29 DIAGNOSIS — F333 Major depressive disorder, recurrent, severe with psychotic symptoms: Secondary | ICD-10-CM

## 2018-05-29 DIAGNOSIS — G4701 Insomnia due to medical condition: Secondary | ICD-10-CM

## 2018-05-29 DIAGNOSIS — F411 Generalized anxiety disorder: Secondary | ICD-10-CM

## 2018-05-29 MED ORDER — HYDROXYZINE PAMOATE 50 MG PO CAPS: 50.0000 mg | ORAL_CAPSULE | Freq: Three times a day (TID) | ORAL | 2 refills | Status: DC | PRN

## 2018-05-29 MED ORDER — CLONIDINE HCL 0.1 MG PO TABS: 0.1000 mg | ORAL_TABLET | Freq: Every day | ORAL | 2 refills | Status: DC

## 2018-05-29 MED ORDER — LITHIUM CARBONATE ER 450 MG PO TBCR: 450.0000 mg | EXTENDED_RELEASE_TABLET | Freq: Every day | ORAL | 2 refills | Status: DC

## 2018-05-29 MED ORDER — CLONIDINE HCL 0.2 MG PO TABS: 0.2000 mg | ORAL_TABLET | Freq: Every day | ORAL | 2 refills | Status: DC

## 2018-05-29 MED ORDER — MIRTAZAPINE 45 MG PO TABS: 45.0000 mg | ORAL_TABLET | Freq: Every day | ORAL | 2 refills | Status: DC

## 2018-05-29 NOTE — Progress Notes (Signed)
[facility] MD/PA/NP OP Progress Note  05/29/2018 1:11 PM Melody Jenkins  MRN:  [id number]  Chief Complaint:  Chief Complaint    Depression; Follow-up     HPI: Pt is reporting significant pain. Driving always makes her back pain worse. Thanksgiving was good except for a rude guest. This aqautance was complaining and saying mean things about the pt and pt's home. Pt cried and took a Klonopin and was able to walk away. Pt is still upset about it. Pt is also upset that someone stole her puppy a few days ago. Pt called the police and felt that the officer was rude and not helpful. The pt complained and the officer and his supervisor showed up at her home. Pt then found out that her dog was inside. Pt felt so bad about her memory and her reaction. It made her realize that she needs "Blue" her dog and will not be able to tolerate her loss. Pt feels like she is having a nervous breakdown. She was excited about Xmas but now is depressed. She has no drive to decorate or put her tree. The family is planning on celebrating Xmas later in the week. Pt remains HV. Her sleep is poor since this incident. She denies SI/HI.    Visit Diagnosis:    ICD-10-CM   1. Anxiety state F41.1 cloNIDine (CATAPRES) 0.1 MG tablet    cloNIDine (CATAPRES) 0.2 MG tablet  2. PTSD (post-traumatic stress disorder) F43.10 hydrOXYzine (VISTARIL) 50 MG capsule  3. 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 Insomnia due to medical condition G47.01 mirtazapine (REMERON) 45 MG tablet       Past Psychiatric History:  Patient endorse history of depression 20 years ago when she was given Lexapro by her primary care physician. She remember a good response from the Lexapro. Patient denies any history of psychiatric inpatient treatment, psychosis, suicidal attempt, mania, hallucination, OCD symptoms.   Previous  Psychotropic Medications:Lexapro, Effexor, Vistaril-ineffective; Pristiq- helpful but increased BP  Substance Abuse History in the last 12 months: No.  Consequences of Substance Abuse: Patient endorse history of drinking alcohol on the weekends and get some time intoxicated but denies any history of seizures, tremors, blackouts or any withdrawal symptoms.    Past Medical History:  Past Medical History:  Diagnosis Date  . Anemia   . Anxiety   . Asthma   . Carpal tunnel syndrome   . Chronic back pain   . Depression   . History of multiple miscarriages   . Hyperlipidemia     Past Surgical History:  Procedure Laterality Date  . BREAST SURGERY  2000   reduction  . CESAREAN SECTION    . COSMETIC SURGERY  2000   breast reduction  . REDUCTION MAMMAPLASTY Bilateral     Family Psychiatric History:  Family History  Problem Relation Age of Onset  . Diabetes Mother   . Hypertension Mother   . Hypertension Father   . Heart disease Father   . Early death Brother   . Kidney disease Maternal Grandmother   . Heart disease Brother   . Colon cancer Neg Hx   . Rectal cancer Neg Hx     Social History:  Social History   Socioeconomic History  . Marital status: Single    Spouse name: Not on file  . Number of children: 1  . Years of education: Not on  file  . Highest education level: Not on file  Occupational History  . Occupation: disablity  Social Needs  . Financial resource strain: Very hard  . Food insecurity:    Worry: Never true    Inability: Never true  . Transportation needs:    Medical: No    Non-medical: No  Tobacco Use  . Smoking status: Former Smoker    Packs/day: 0.25    Years: 30.00    Pack years: 7.50    Types: Cigarettes    Last attempt to quit: 03/29/2018    Years since quitting: 0.1  . Smokeless tobacco: Never Used  Substance and Sexual Activity  . Alcohol use: No  . Drug use: No  . Sexual activity: Not Currently    Partners: Male    Birth  control/protection: Post-menopausal  Lifestyle  . Physical activity:    Days per week: 0 days    Minutes per session: 0 min  . Stress: Rather much  Relationships  . Social connections:    Talks on phone: More than three times a week    Gets together: Once a week    Attends religious service: Never    Active member of club or organization: No    Attends meetings of clubs or organizations: Never    Relationship status: Never married  Other Topics Concern  . Not on file  Social History Narrative  . Not on file    Allergies:  Allergies  Allergen Reactions  . Trazodone And Nefazodone Hives    Metabolic Disorder Labs: Lab Results  Component Value Date   HGBA1C 6.2 (H) 04/29/2018   MPG 131 04/29/2018   MPG 137 01/13/2018   No results found for: PROLACTIN Lab Results  Component Value Date   CHOL 236 (H) 04/29/2018   TRIG 154 (H) 04/29/2018   HDL 37 (L) 04/29/2018   CHOLHDL 6.4 (H) 04/29/2018   VLDL 29 03/05/2016   LDLCALC 169 (H) 04/29/2018   LDLCALC 111 (H) 01/13/2018   Lab Results  Component Value Date   TSH 1.13 01/13/2018   TSH 0.83 11/07/2016    Therapeutic Level Labs: No results found for: LITHIUM No results found for: VALPROATE No components found for:  CBMZ  Current Medications: Current Outpatient Medications  Medication Sig Dispense Refill  . Biotin 5000 MCG CAPS Take 1 capsule by mouth.    . BYSTOLIC 10 MG tablet TAKE 1 TABLET BY MOUTH EVERY DAY 30 tablet 6  . Cholecalciferol (VITAMIN D3) 25 MCG (1000 UT) CAPS Take by mouth 2 (two) times daily.    . cloNIDine (CATAPRES) 0.1 MG tablet Take 1 tablet (0.1 mg total) by mouth daily. In the morning. 30 tablet 2  . cloNIDine (CATAPRES) 0.2 MG tablet Take 1 tablet (0.2 mg total) by mouth daily. In the evening. 30 tablet 2  . Cyanocobalamin (VITAMIN B 12 PO) Take 1 tablet by mouth daily.     . cyclobenzaprine (FLEXERIL) 10 MG tablet Take 1 tablet (10 mg total) by mouth 3 (three) times daily as needed for  muscle spasms. 30 tablet 0  . furosemide (LASIX) 20 MG tablet TAKE 1 TABLET BY MOUTH EVERY DAY 30 tablet 6  . gabapentin (NEURONTIN) 300 MG capsule Take 600 mg by mouth 3 (three) times daily.    . hydrOXYzine (VISTARIL) 50 MG capsule Take 1 capsule (50 mg total) by mouth 3 (three) times daily as needed. 90 capsule 2  . Lidocaine-Hydrocortisone Ace 3-0.5 % CREA Apply topically twice per day. (  Patient taking differently: daily as needed. Apply topically twice per day.) 30 g 0  . lithium carbonate (ESKALITH) 450 MG CR tablet Take 1 tablet (450 mg total) by mouth at bedtime. 30 tablet 2  . meloxicam (MOBIC) 7.5 MG tablet Take 7.5 mg by mouth 2 (two) times daily.  5  . metFORMIN (GLUCOPHAGE) 500 MG tablet Take 1 tablet (500 mg total) by mouth daily with breakfast. 90 tablet 3  . mirtazapine (REMERON) 45 MG tablet Take 1 tablet (45 mg total) by mouth at bedtime. 30 tablet 2  . Prucalopride Succinate (MOTEGRITY) 2 MG TABS Take 2 mg by mouth daily. 30 tablet 1  . traMADol (ULTRAM) 50 MG tablet Take 50 mg by mouth 4 (four) times daily.     . vitamin C (ASCORBIC ACID) 500 MG tablet Take 500 mg by mouth daily.    . vitamin E (VITAMIN E) 400 UNIT capsule Take 400 Units by mouth daily.     Current Facility-Administered Medications  Medication Dose Route Frequency Provider Last Rate Last Dose  . 0.9 %  sodium chloride infusion  500 mL Intravenous Once Armbruster, Carlota Raspberry, MD         Musculoskeletal: Strength & Muscle Tone: within normal limits Gait & Station: broad based, walking with cane Patient leans: N/A  Psychiatric Specialty Exam: Review of Systems  Constitutional: Negative for chills, diaphoresis and fever.  Musculoskeletal: Positive for back pain. Negative for joint pain and neck pain.    Blood pressure (!) 159/103, pulse 82, height 5\' 7"  (1.702 m), weight 248 lb (112.5 kg), SpO2 95 %.Body mass index is 38.84 kg/m.  General Appearance: Fairly Groomed  Eye Contact:  Good  Speech:  Clear  and Coherent and Normal Rate  Volume:  Normal  Mood:  Depressed  Affect:  Congruent  Thought Process:  Coherent and Descriptions of Associations: Circumstantial  Orientation:  Full (Time, Place, and Person)  Thought Content:  Logical  Suicidal Thoughts:  No  Homicidal Thoughts:  No  Memory:  Immediate;   Good  Judgement:  Good  Insight:  Good  Psychomotor Activity:  Normal  Concentration:  Concentration: Good  Recall:  Good  Fund of Knowledge:  Good  Language:  Good  Akathisia:  No  Handed:  Right  AIMS (if indicated):     Assets:  Communication Skills Desire for Improvement Housing Talents/Skills Transportation  ADL's:  Intact  Cognition:  WNL  Sleep:            Screenings: GAD-7     Office Visit from 02/06/2016 in [facility]  Total GAD-7 Score  21    PHQ2-9     Office Visit from 04/29/2018 in [facility] Office Visit from 01/13/2018 in [facility] Office Visit from 09/13/2017 in [facility] Office Visit from 08/06/2017 in [facility] Visit from 05/15/2017 in [facility]  PHQ-2 Total Score  4  4  0  5  5  PHQ-9 Total Score  15  17  0  18  21      I reviewed the information below on 05/29/2018 and have updated it Assessment and Plan: MDD with anxiety, recurrent, severe without psychotic features; PTSD; insomnia    Medication management with supportive therapy. Risks and benefits, side effects and alternative treatment options discussed with patient. Pt was given an opportunity to ask questions about medication, illness, and treatment. All current psychiatric medications have been  reviewed and discussed with the patient and adjusted as clinically appropriate. The patient has been provided an accurate and updated list of the medications being now prescribed. Patient expressed understanding of how their medications were to be used.  Pt verbalized understanding and  verbal consent obtained for treatment.  The risk of un-intended pregnancy is low based on the fact that pt reports she is postmenopausal. Pt is aware that these meds carry a teratogenic risk. Pt will discuss plan of action if she does or plans to become pregnant in the future.  Status of current problems: worsening depression  Meds: Vistaril 50 mg p.o. 3 times daily as needed anxiety as related to depression and PTSD Remeron 45 mg p.o. nightly for MDD, PTSD and insomnia Clonidine 0.1 mg p.o. qAM and  0.2mg  qPM (prescribed by PCP) as needed anxiety- pt denies SE Increases Lithium ER 450mg  po qHS due to ongoing depression symptoms Patient is on Bystolic and Neurontin as prescribed by PCP both have been off label indication for anxiety   Labs: None  Therapy: brief supportive therapy provided. Discussed psychosocial stressors in detail.   Encouraged pt to develop daily routine and work on daily goal setting as a way to improve mood symptoms.   Consultations: Encouraged to follow up with therapist Encouraged to follow up with PCP as needed-she knows that her blood pressure is elevated whenever she is in pain and is working with her PCP  Pt denies SI and is at an acute low risk for suicide. Patient told to call clinic if any problems occur. Patient advised to go to ER if they should develop SI/HI, side effects, or if symptoms worsen. Has crisis numbers to call if needed. Pt verbalized understanding.  F/up in 2 months or sooner if needed    Charlcie Cradle, MD 05/29/2018, 1:11 PM

## 2018-06-02 ENCOUNTER — Ambulatory Visit (HOSPITAL_COMMUNITY): Payer: Self-pay | Admitting: Licensed Clinical Social Worker

## 2018-06-09 ENCOUNTER — Encounter: Payer: Self-pay | Admitting: Family Medicine

## 2018-06-10 MED ORDER — METOPROLOL SUCCINATE ER 50 MG PO TB24: 50.0000 mg | ORAL_TABLET | Freq: Every day | ORAL | 3 refills | Status: DC

## 2018-06-12 ENCOUNTER — Ambulatory Visit (HOSPITAL_COMMUNITY): Payer: Medicaid Other | Admitting: Licensed Clinical Social Worker

## 2018-06-18 ENCOUNTER — Ambulatory Visit (INDEPENDENT_AMBULATORY_CARE_PROVIDER_SITE_OTHER): Payer: 59 | Admitting: Licensed Clinical Social Worker

## 2018-06-18 ENCOUNTER — Encounter (HOSPITAL_COMMUNITY): Payer: Self-pay | Admitting: Licensed Clinical Social Worker

## 2018-06-18 DIAGNOSIS — F333 Major depressive disorder, recurrent, severe with psychotic symptoms: Secondary | ICD-10-CM | POA: Diagnosis not present

## 2018-06-18 DIAGNOSIS — F411 Generalized anxiety disorder: Secondary | ICD-10-CM | POA: Diagnosis not present

## 2018-06-18 NOTE — Progress Notes (Signed)
   THERAPIST PROGRESS NOTE  Session Time: 11:10-12pm  Participation Level: Active  Behavioral Response: Casual/Alert/Depressed   Type of Therapy: Individual Therapy  Treatment Goals addressed: Improve psychiatric symptoms, Controlled Behavior, Moderated Mood, Improve Unhelpful Thought Patterns, Emotional Regulation Skills (Moderate moods, anger management, stress management), Feel and express a full Range of Emotions, Learn about Diagnosis, Healthy Coping Skills.     Interventions: CBT  Summary: Melody Jenkins is a 53 y.o. female presents for her individual  counseling session. Pt discussed her psychiatric symptoms and current life events. Pt presents depressed today. She reports that she was depressed over the holidays. Asked open ended questions and used empathic reflection. Discussed toxic relationships with pt as she continues to let negative people in her inner circle. Discussed how this affects her. Discussed how to keep negative people and relationships out of her life and to focus on more positive relationships. Pt continues to have problems with sleep. Discussed sleep hygiene with pt. Pt discussed her fall (from 2 years ago) and how it has affected her life. Used empathic reflection with pt. Pt is scheduled to have carpal tunnel surgery Monday. Discussed her care.    Suicidal/Homicidal: Nowithout intent/plan  Therapist Response: Assessed pt's current functioning and reviewed progress. Assisted pt processing relationship circles, carpal tunnel surgery, toxic relationships.  Assisted pt  processing for management of stressors.  Plan: see again in 2 weeks  Diagnosis: Axis I: MDD, recurrent severe with psychosis, GAD        Melody Jenkins S, LCAS  05/21/18

## 2018-06-19 ENCOUNTER — Encounter: Payer: Self-pay | Admitting: Family Medicine

## 2018-06-19 NOTE — Telephone Encounter (Addendum)
Patient HTN meds changed as Bystoic is no longer covered. She began Metoprolol XR 50mg  PO QD.   States that she has not been checking her BP as she thought medication would take some time to get into her system. Reports that she checked her BP today and noted at 150/107. Reports that she has been re-checking it and it continues to get higher. Last reading noted at 153/110.   Patient is also taking Furosemide 20mg  PO QD, Clonidine 0.1mg  PO QAM, Clonidine 0.2mg  PO QHS and additional Clonidine 0.1mg  PO PRN for anxiety.  Inquired as to what she needs to do right now. Advised to take PRN Clonidine 0.1mg . Advised that she needs to be re-evaluated and appt scheduled with PCP for 06/20/2018.

## 2018-06-20 ENCOUNTER — Other Ambulatory Visit: Payer: Self-pay

## 2018-06-20 ENCOUNTER — Encounter: Payer: Self-pay | Admitting: Family Medicine

## 2018-06-20 ENCOUNTER — Ambulatory Visit: Payer: Medicaid Other | Admitting: Family Medicine

## 2018-06-20 VITALS — BP 148/90 | HR 68 | Temp 98.0°F | Resp 16 | Ht 67.0 in | Wt 249.0 lb

## 2018-06-20 DIAGNOSIS — I10 Essential (primary) hypertension: Secondary | ICD-10-CM | POA: Diagnosis not present

## 2018-06-20 DIAGNOSIS — L709 Acne, unspecified: Secondary | ICD-10-CM | POA: Diagnosis not present

## 2018-06-20 MED ORDER — METOPROLOL SUCCINATE ER 100 MG PO TB24: 100.0000 mg | ORAL_TABLET | Freq: Every day | ORAL | 6 refills | Status: DC

## 2018-06-20 MED ORDER — CLINDAMYCIN PHOS-BENZOYL PEROX 1-5 % EX GEL: Freq: Two times a day (BID) | CUTANEOUS | 2 refills | Status: DC

## 2018-06-20 NOTE — Patient Instructions (Addendum)
F/U as previous Send me BP results 2 weeks Increase Toprol to 100mg  XR

## 2018-06-20 NOTE — Assessment & Plan Note (Signed)
Increase metoprolol to 100 mg once a day.  I think since the change in the Bystolic and her increased pain being off her anti-inflammatory as her blood pressure up however she needs surgical intervention next week and needs a blood pressure to be at goal.  She is going to check her blood pressures at home and send me results in 2 weeks.

## 2018-06-20 NOTE — Progress Notes (Signed)
   Subjective:    Patient ID: Melody Jenkins, female    DOB: 1965-09-20, 53 y.o.   MRN: [id number]  Patient presents for HTN (blood pressure redings elevated since changing to Toprol- see phone note) and Skin Irritation (face breaking out around chin)  Pt here for HTN - she was changed to metoprolol 50mg  XR as bystolic was not covered by her insurance, clonidine 0.1in am and 0.2mg  in the evening, lasix 20mg ,  her BP was up to 156/110, and she felt fuzzy headed and off.  Scheduled for carpal tunnel on left hand, she has been off meloxicam , she has had more pain all over, she is taking ultram and gabapentin  She is still seeing psychiatry and continues to have anxiety  Chin still breaking out, wants to see if new insurance will cover the antibiotic cream Using ambi and over-the-counter facial wash with no improvement.   Review Of Systems:  GEN- denies fatigue, fever, weight loss,weakness, recent illness HEENT- denies eye drainage, change in vision, nasal discharge, CVS- denies chest pain, palpitations RESP- denies SOB, cough, wheeze ABD- denies N/V, change in stools, abd pain GU- denies dysuria, hematuria, dribbling, incontinence MSK- + joint pain, muscle aches, injury Neuro- denies headache, dizziness, syncope, seizure activity       Objective:    BP (!) 148/90   Pulse 68   Temp 98 F (36.7 C) (Oral)   Resp 16   Ht 5\' 7"  (1.702 m)   Wt 249 lb (112.9 kg)   SpO2 96%   BMI 39.00 kg/m  GEN- NAD, alert and oriented x3  Repeat BP 158/102 HEENT- PERRL, EOMI, non injected sclera, pink conjunctiva, MMM, oropharynx clear Neck- Supple, no thyromegaly CVS- RRR, no murmur RESP-CTAB Psych- not anxious appearing, well groomed, good eye contact, appears in pain with movement  EXT- No edema Skin- acne with hyperpigmented scarring on chin  Pulses- Radial 2+        Assessment & Plan:    No pain medication given she has a pain physician has tramadol unfortunately cannot go back on  her anti-inflammatory until after her surgery. Problem List Items Addressed This Visit      Unprioritized   Essential hypertension, benign - Primary    Increase metoprolol to 100 mg once a day.  I think since the change in the Bystolic and her increased pain being off her anti-inflammatory as her blood pressure up however she needs surgical intervention next week and needs a blood pressure to be at goal.  She is going to check her blood pressures at home and send me results in 2 weeks.      Relevant Medications   metoprolol succinate (TOPROL-XL) 100 MG 24 hr tablet    Other Visit Diagnoses    Adult acne       BenzaClin sent   Relevant Medications   clindamycin-benzoyl peroxide (BENZACLIN) gel      Note: This dictation was prepared with Dragon dictation along with smaller phrase technology. Any transcriptional errors that result from this process are unintentional.

## 2018-06-24 ENCOUNTER — Encounter: Payer: Self-pay | Admitting: Family Medicine

## 2018-06-29 ENCOUNTER — Encounter: Payer: Self-pay | Admitting: Family Medicine

## 2018-07-01 ENCOUNTER — Encounter: Payer: Self-pay | Admitting: Family Medicine

## 2018-07-02 ENCOUNTER — Encounter (HOSPITAL_COMMUNITY): Payer: Self-pay | Admitting: Licensed Clinical Social Worker

## 2018-07-02 ENCOUNTER — Ambulatory Visit (INDEPENDENT_AMBULATORY_CARE_PROVIDER_SITE_OTHER): Payer: Medicaid Other | Admitting: Licensed Clinical Social Worker

## 2018-07-02 DIAGNOSIS — F4310 Post-traumatic stress disorder, unspecified: Secondary | ICD-10-CM | POA: Diagnosis not present

## 2018-07-02 DIAGNOSIS — F333 Major depressive disorder, recurrent, severe with psychotic symptoms: Secondary | ICD-10-CM

## 2018-07-02 DIAGNOSIS — F411 Generalized anxiety disorder: Secondary | ICD-10-CM

## 2018-07-02 NOTE — Progress Notes (Signed)
   THERAPIST PROGRESS NOTE  Session Time: 11:10-12pm  Participation Level: Active  Behavioral Response: Casual/Alert/Depressed   Type of Therapy: Individual Therapy  Treatment Goals addressed: Improve psychiatric symptoms, Controlled Behavior, Moderated Mood, Improve Unhelpful Thought Patterns, Emotional Regulation Skills (Moderate moods, anger management, stress management), Feel and express a full Range of Emotions, Learn about Diagnosis, Healthy Coping Skills.     Interventions: CBT  Summary: Melody Jenkins is a 53 y.o. female presents for her individual  counseling session. Pt discussed her psychiatric symptoms and current life events. Pt presents depressed today and in chronic pain. Pt had carpel tunnel surgery last week. She had increase in bp but they were able to complete the surgery. Pt was able to get an aide to assist her in the home. Discussed the benefits of the aide in assisting her. Pt has struggled with her new bp med, she is not eating regularly. Educated pt on the importance of eating healthy regularly. Pt described an upsetting discussion with her attorney for her claim. She became upset in session. Assisted pt in a breathing and mindfulness activity to get her to become less agitated.    Suicidal/Homicidal: Nowithout intent/plan  Therapist Response: Assessed pt's current functioning and reviewed progress. Assisted pt processing, carpal tunnel surgery, healthy eating habits, attorney discussion, breathing exercise and mindfulness activity.  Assisted pt  processing for management of stressors.  Plan: see again in 2 weeks  Diagnosis: Axis I: MDD, recurrent severe with psychosis, GAD        Denene Alamillo S, LCAS  05/21/18

## 2018-07-03 ENCOUNTER — Encounter: Payer: Self-pay | Admitting: Family Medicine

## 2018-07-04 ENCOUNTER — Telehealth: Payer: Self-pay | Admitting: *Deleted

## 2018-07-04 DIAGNOSIS — F411 Generalized anxiety disorder: Secondary | ICD-10-CM

## 2018-07-04 NOTE — Telephone Encounter (Signed)
Received BP readings as follows: 142/94  144/93  145/96  143/95  145/96   Reports that she continues metoprolol, Lasix and Clonidine.

## 2018-07-04 NOTE — Telephone Encounter (Signed)
Change clonidine to 0.2mg  in the morning and evening Keep metoprolol the same and lasix the same

## 2018-07-07 MED ORDER — CLONIDINE HCL 0.2 MG PO TABS: 0.2000 mg | ORAL_TABLET | Freq: Two times a day (BID) | ORAL | 2 refills | Status: DC

## 2018-07-10 ENCOUNTER — Encounter: Payer: Self-pay | Admitting: Family Medicine

## 2018-07-10 ENCOUNTER — Ambulatory Visit (INDEPENDENT_AMBULATORY_CARE_PROVIDER_SITE_OTHER): Payer: Medicaid Other | Admitting: Licensed Clinical Social Worker

## 2018-07-10 ENCOUNTER — Encounter (HOSPITAL_COMMUNITY): Payer: Self-pay | Admitting: Licensed Clinical Social Worker

## 2018-07-10 DIAGNOSIS — F411 Generalized anxiety disorder: Secondary | ICD-10-CM | POA: Diagnosis not present

## 2018-07-10 DIAGNOSIS — F331 Major depressive disorder, recurrent, moderate: Secondary | ICD-10-CM | POA: Diagnosis not present

## 2018-07-10 NOTE — Progress Notes (Signed)
   THERAPIST PROGRESS NOTE  Session Time: 11:10-12pm  Participation Level: Active  Behavioral Response: Casual/Alert/Depressed   Type of Therapy: Individual Therapy  Treatment Goals addressed: Improve psychiatric symptoms, Controlled Behavior, Moderated Mood, Improve Unhelpful Thought Patterns, Emotional Regulation Skills (Moderate moods, anger management, stress management), Feel and express a full Range of Emotions, Learn about Diagnosis, Healthy Coping Skills.     Interventions: CBT  Summary: Melody Jenkins is a 53 y.o. female presents for her individual  counseling session. Pt discussed her psychiatric symptoms and current life events. Pt presents depressed today and in chronic pain. Pt's hand she had carpel tunnel surgery last week on is swollen. She is going in to see the dr. Abbott Pao is very frustrated about the results of her court case. She feels she was treated unfairly. She has made a complaint against the attorney who handled it and a new attorney is reviewing her case. Pt has low frustration tolerance, lashing out, agitated, blaming. Discussed pt's locus of control. Pt is grieving the loss of her dog. "Ive had so much going on I have had little time to grieve." Discussed with pt healing through the grieving process. Pt still has her upcoming disability hearing which is scheduled for 10/2018. Asked pt open ended questions.       Suicidal/Homicidal: Nowithout intent/plan  Therapist Response: Assessed pt's current functioning and reviewed progress. Assisted pt processing, carpal tunnel surgery, low frustration tolerance, locus of control, grieving the loss of her dog, attorney.  Assisted pt  processing for management of stressors.  Plan: see again in 2 weeks  Diagnosis: Axis I: MDD, recurrent severe with psychosis, GAD        Masiya Claassen S, LCAS  07/10/2018

## 2018-07-16 ENCOUNTER — Ambulatory Visit (INDEPENDENT_AMBULATORY_CARE_PROVIDER_SITE_OTHER): Payer: Medicaid Other | Admitting: Licensed Clinical Social Worker

## 2018-07-16 DIAGNOSIS — F333 Major depressive disorder, recurrent, severe with psychotic symptoms: Secondary | ICD-10-CM | POA: Diagnosis not present

## 2018-07-16 DIAGNOSIS — F411 Generalized anxiety disorder: Secondary | ICD-10-CM

## 2018-07-17 ENCOUNTER — Encounter (HOSPITAL_COMMUNITY): Payer: Self-pay | Admitting: Licensed Clinical Social Worker

## 2018-07-17 NOTE — Progress Notes (Signed)
   THERAPIST PROGRESS NOTE  Session Time: 11:10-12pm  Participation Level: Active  Behavioral Response: Casual/Alert/Depressed   Type of Therapy: Individual Therapy  Treatment Goals addressed: Improve psychiatric symptoms, Controlled Behavior, Moderated Mood, Improve Unhelpful Thought Patterns, Emotional Regulation Skills (Moderate moods, anger management, stress management), Feel and express a full Range of Emotions, Learn about Diagnosis, Healthy Coping Skills.     Interventions: CBT  Summary: Melody Jenkins is a 53 y.o. female presents for her individual  counseling session. Pt discussed her psychiatric symptoms and current life events. Pt presents depressed today and in chronic pain. Pt's hand she had carpel tunnel surgery 3  Weeks ago continues to be swollen. The dr has prescribed steroids. Pt continues to obsess about her court case and has turned over all her records to a new attorney. Asked open ended questions and used empathic reflection. Pt also has a pending disability case. Discussed her stress level and skills she uses for stress. Pt discussed her pain at length today and how it affects all aspects of her life. Pt wanted to discuss non-opiate medications vs opiate medications. Discussed addiction with pt.     Suicidal/Homicidal: Nowithout intent/plan  Therapist Response: Assessed pt's current functioning and reviewed progress. Assisted pt processing, carpal tunnel pain, court case, disability court case, stress level and coping skills, chronic pain, opiate medication, addiction.  Assisted pt  processing for management of stressors.  Plan: see again in 2 weeks  Diagnosis: Axis I: MDD, recurrent severe with psychosis, GAD        MACKENZIE,LISBETH S, LCAS  07/16/2018

## 2018-07-24 ENCOUNTER — Ambulatory Visit (HOSPITAL_COMMUNITY): Payer: Medicaid Other | Admitting: Licensed Clinical Social Worker

## 2018-07-31 ENCOUNTER — Ambulatory Visit (INDEPENDENT_AMBULATORY_CARE_PROVIDER_SITE_OTHER): Payer: Medicaid Other | Admitting: Licensed Clinical Social Worker

## 2018-07-31 ENCOUNTER — Encounter (HOSPITAL_COMMUNITY): Payer: Self-pay | Admitting: Licensed Clinical Social Worker

## 2018-07-31 DIAGNOSIS — F333 Major depressive disorder, recurrent, severe with psychotic symptoms: Secondary | ICD-10-CM

## 2018-07-31 DIAGNOSIS — F411 Generalized anxiety disorder: Secondary | ICD-10-CM
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
## 2018-08-07 ENCOUNTER — Encounter (HOSPITAL_COMMUNITY): Payer: Self-pay | Admitting: Psychiatry

## 2018-08-07 ENCOUNTER — Ambulatory Visit (INDEPENDENT_AMBULATORY_CARE_PROVIDER_SITE_OTHER): Payer: Medicaid Other | Admitting: Psychiatry

## 2018-08-07 VITALS — BP 140/78 | HR 80 | Ht 67.25 in | Wt 250.0 lb

## 2018-08-07 DIAGNOSIS — F333 Major depressive disorder, recurrent, severe with psychotic symptoms: Secondary | ICD-10-CM | POA: Diagnosis not present

## 2018-08-07 DIAGNOSIS — F4310 Post-traumatic stress disorder, unspecified: Secondary | ICD-10-CM

## 2018-08-07 DIAGNOSIS — G4701 Insomnia due to medical condition: Secondary | ICD-10-CM | POA: Diagnosis not present

## 2018-08-07 MED ORDER — MIRTAZAPINE 45 MG PO TABS: 45.0000 mg | ORAL_TABLET | Freq: Every day | ORAL | 2 refills | Status: DC

## 2018-08-07 MED ORDER — LITHIUM CARBONATE ER 450 MG PO TBCR: 450.0000 mg | EXTENDED_RELEASE_TABLET | Freq: Every day | ORAL | 2 refills | Status: DC

## 2018-08-07 MED ORDER — HYDROXYZINE PAMOATE 50 MG PO CAPS: 50.0000 mg | ORAL_CAPSULE | Freq: Three times a day (TID) | ORAL | 2 refills | Status: DC | PRN
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
## 2018-08-13 ENCOUNTER — Ambulatory Visit (INDEPENDENT_AMBULATORY_CARE_PROVIDER_SITE_OTHER): Payer: Medicaid Other | Admitting: Licensed Clinical Social Worker

## 2018-08-13 ENCOUNTER — Encounter (HOSPITAL_COMMUNITY): Payer: Self-pay | Admitting: Licensed Clinical Social Worker

## 2018-08-13 DIAGNOSIS — F332 Major depressive disorder, recurrent severe without psychotic features: Secondary | ICD-10-CM | POA: Diagnosis not present

## 2018-08-13 NOTE — Progress Notes (Signed)
   THERAPIST PROGRESS NOTE  Session Time: 11:10-12pm  Participation Level: Active  Behavioral Response: Casual/Alert/Depressed   Type of Therapy: Individual Therapy  Treatment Goals addressed: Improve psychiatric symptoms, Controlled Behavior, Moderated Mood, Improve Unhelpful Thought Patterns, Emotional Regulation Skills (Moderate moods, anger management, stress management), Feel and express a full Range of Emotions, Learn about Diagnosis, Healthy Coping Skills.     Interventions: CBT  Summary: Melody Jenkins is a 53 y.o. female presents for her individual  counseling session. Pt discussed her psychiatric symptoms and current life events. Pt presents depressed today and in chronic pain. Pt saw Dr. Doyne Keel, psychiatrist, who did not change any medications. Pt admits to worsening of her depressive symptoms due to her continued chronic pain and life stressors. Pt struggles with her many dr appts, decisions to be made, running a household while suffering from chronic pain. Discussed her redirection coping skills for pain. Discussed her current thoughts of opiate drugs. Pt has decided to get a 3rd opinion on back surgery. Discussed pt's communication skills with others: demanding, aggressive and the outcomes she receives. Assisted pt writing down words to say to her drs and new attorney using assertive communication. Continue to reinforce the importance of self care.      Suicidal/Homicidal: Nowithout intent/plan  Therapist Response: Assessed pt's current functioning and reviewed progress. Assisted pt processing pain, communication skills, and coping skills, chronic pain, opiate medication, addiction.  Assisted pt  processing for management of stressors.  Plan: see again in 2 weeks  Diagnosis: Axis I: MDD, recurrent severe without psychosis, GAD        MACKENZIE,LISBETH S, LCAS  07/16/2018

## 2018-08-18 ENCOUNTER — Telehealth: Payer: Self-pay | Admitting: Gastroenterology

## 2018-08-18 MED ORDER — PRUCALOPRIDE SUCCINATE 2 MG PO TABS: 2.0000 mg | ORAL_TABLET | Freq: Every day | ORAL | 0 refills | Status: DC

## 2018-08-18 NOTE — Telephone Encounter (Signed)
Called and scheduled an office visit with Dr. Havery Moros in April. Sent refill for 1 month to get to her to appt.

## 2018-08-18 NOTE — Telephone Encounter (Signed)
Pt needs rf for [facility] sent to [facility] on [address].

## 2018-08-19 ENCOUNTER — Ambulatory Visit: Payer: Medicaid Other | Attending: Neurosurgery | Admitting: Occupational Therapy

## 2018-08-19 DIAGNOSIS — M6281 Muscle weakness (generalized): Secondary | ICD-10-CM

## 2018-08-19 DIAGNOSIS — M25642 Stiffness of left hand, not elsewhere classified: Secondary | ICD-10-CM | POA: Diagnosis present

## 2018-08-19 DIAGNOSIS — M25641 Stiffness of right hand, not elsewhere classified: Secondary | ICD-10-CM | POA: Diagnosis present

## 2018-08-19 DIAGNOSIS — M79642 Pain in left hand: Secondary | ICD-10-CM | POA: Diagnosis not present

## 2018-08-19 DIAGNOSIS — R278 Other lack of coordination: Secondary | ICD-10-CM

## 2018-08-19 NOTE — Patient Instructions (Signed)
Massage your incision site 2x day with lotion and firm pressure for 5 mins   Flexor Tendon Gliding (Active Hook Fist)   With fingers and knuckles straight, bend middle and tip joints. Do not bend large knuckles. Repeat _10-15___ times. Do _4-6___ sessions per day.  MP Flexion (Active)   With back of hand on table, bend large knuckles as far as they will go, keeping small joints straight. Repeat _10-15___ times. Do __4-6__ sessions per day. Activity: Reach into a narrow container.*      Finger Flexion / Extension   With palm up, bend fingers of left hand toward palm, making a  fist. Straighten fingers, opening fist. Repeat sequence _10-15___ times per session. Do _4-6__ sessions per day. Hand Variation: Palm down   Copyright  V[employer]. All rights reserved.

## 2018-08-19 NOTE — Therapy (Signed)
[facility] [address] [place], [place], [address] Phone: [phone]   Fax:  [phone]  Occupational Therapy Evaluation  Patient Details  Name: Melody Jenkins MRN: [id number] Date of Birth: [birth year] Referring Provider (OT): Dr Saintclair Halsted   Encounter Date: 08/19/2018  OT End of Session - 08/19/18 1236    Visit Number  1    Number of Visits  12    Date for OT Re-Evaluation  10/18/18    Authorization Type  Medicaid await auth    OT Start Time  1109    OT Stop Time  1150    OT Time Calculation (min)  41 min    Activity Tolerance  Patient limited by pain    Behavior During Therapy  Anxious       Past Medical History:  Diagnosis Date  . Anemia   . Anxiety   . Asthma   . Carpal tunnel syndrome   . Chronic back pain   . Depression   . History of multiple miscarriages   . Hyperlipidemia     Past Surgical History:  Procedure Laterality Date  . BREAST SURGERY  2000   reduction  . CESAREAN SECTION    . COSMETIC SURGERY  2000   breast reduction  . REDUCTION MAMMAPLASTY Bilateral     There were no vitals filed for this visit.  Subjective Assessment - 08/19/18 1233    Subjective   pt reports hx of bilateral carpal tunnel relase surgery    Pertinent History  RUE CTR 2018, LUE CTR Jan 2020, anxiety, depression, PTSD, torn MCL in LLE and bulging disc in back, see pain management    Patient Stated Goals  to be able to use her dominant LUE and to better grip items with both hands.    Currently in Pain?  Yes    Pain Score  8     Pain Location  Hand    Pain Orientation  Left    Pain Descriptors / Indicators  Aching;Burning    Pain Type  Acute pain    Pain Onset  More than a month ago    Pain Frequency  Constant    Aggravating Factors   use    Pain Relieving Factors  rest    Multiple Pain Sites  No        [facility] OT Assessment - 08/19/18 1116      Assessment   Medical Diagnosis  bilateral carpal tunnel  syndrome    Referring Provider (OT)  Dr Saintclair Halsted    Onset Date/Surgical Date  07/29/18   date of referral, LUE CTR 06/18/2018, RUE CTR 2018   Hand Dominance  Left      Precautions   Precautions  Fall    Precaution Comments  no heavy lifting because of back issues      Balance Screen   Has the patient fallen in the past 6 months  No    Has the patient had a decrease in activity level because of a fear of falling?   No      Home  Environment   Family/patient expects to be discharged to:  Private residence    Living Arrangements  Children    Lives With  Daughter   [facility] aide 3-6 pm     Prior Function   Level of Independence  Needs assistance with ADLs;Needs assistance with homemaking    Vocation  On disability    Leisure  reading  ADL   Eating/Feeding  Modified independent   feeding with right hand primarily   Grooming  --   min A grooming   Upper Body Bathing  Minimal assistance    Lower Body Bathing  Moderate assistance    Upper Body Dressing  Moderate assistance    Lower Body Dressing  Moderate assistance    Toilet Transfer  Modified independent    Tub/Shower Transfer  Supervision/safety   tub bench   ADL comments  Pt reports difficulty with the following: writing, brushing teeth, curling hair and opening containers.      IADL   Light Housekeeping  Does not participate in any housekeeping tasks    Meal Prep  Needs to have meals prepared and served;Able to complete simple cold meal and snack prep    Medication Management  Takes responsibility if medication is prepared in advance in seperate dosage    Financial Management  Requires supervision/minimal cuing      Mobility   Mobility Status  --   modified independent with cane     Written Expression   Dominant Hand  Left    Handwriting  90% legible      Cognition   Overall Cognitive Status  Within Functional Limits for tasks assessed      Sensation   Light Touch  Impaired by gross assessment      Coordination    Fine Motor Movements are Fluid and Coordinated  No    9 Hole Peg Test  Right;Left    Right 9 Hole Peg Test  38.65 secs    Left 9 Hole Peg Test  52.75 secs      ROM / Strength   AROM / PROM / Strength  AROM      AROM   Overall AROM   Deficits    Overall AROM Comments  RUE composite finger flexion 90%-difficulty flexing index finger, full composite finger ext., opposes all digits with thumb, LUE 75% composite finger flexion , 95% extension, unable to oppose 5th digit with thumb.      Hand Function   Right Hand Grip (lbs)  14 lbs    Left Hand Grip (lbs)  5 lbs                      OT Education - 08/19/18 1253    Education Details  scar maasage, tendon gliding    Person(s) Educated  Patient    Methods  Explanation;Demonstration;Verbal cues    Comprehension  Verbalized understanding;Returned demonstration       OT Short Term Goals - 08/19/18 1245      OT SHORT TERM GOAL #1   Title  I with initial HEP    Baseline  dependent    Time  4    Period  Weeks    Status  New    Target Date  09/18/18      OT SHORT TERM GOAL #2   Title  I with scar massage and edema control techniques.    Baseline  dependent    Time  4    Period  Weeks    Status  New      OT SHORT TERM GOAL #3   Title  Pt will demonstrate at least 90% composite finger flexion for LUE in prep for functional use with pain no greater than 6/10    Baseline  75% composite finger flexion for LUE, pain 8/10    Time  4  Period  Weeks    Status  New      OT SHORT TERM GOAL #4   Title  Pt will demonstrate improved fine motor coordination as evidneced by perfroming 9 hole peg test in 45 secs or less for LUE.    Baseline  52.75 secs    Time  4    Period  Weeks    Status  New        OT Long Term Goals - 08/19/18 1248      OT LONG TERM GOAL #1   Title  I with updated HEP.    Baseline  dependent    Time  8    Period  Weeks    Status  New    Target Date  10/18/18      OT LONG TERM GOAL #2    Title  Pt will increase LUE grip strength to 15 lbs or greater in prep for ADLS.    Baseline  LUE 5 lbs    Time  8    Period  Weeks    Status  New      OT LONG TERM GOAL #3   Title  Pt will increase RUE grip strength to 25 lbs or greater for increased fucntional use during ADLS.    Baseline  RUE 14 lbs    Time  8    Period  Weeks    Status  New      OT LONG TERM GOAL #4   Title  Pt will resume use of LUE as her dominant hand at least 50% of the time or greater with painless than or equal to 5/10.    Baseline  uses LUE as dominant hand 10% of the time. Pain 8/10    Time  8    Period  Weeks    Status  New      OT LONG TERM GOAL #5   Title  Pt will report increased ease with brushing teeth, opening continers and curling her hair with LUE.    Baseline  Pt requires increased assistance due to limited LUE functional use and pain.    Time  8    Period  Weeks    Status  New            Plan - 08/19/18 1239    Clinical Impression Statement  Pt is a 53 y.o female s/p LUE carpal tunnel release 06/18/2018(per pt report) and RUE carpal tunnel relase in 2018. Pt presents to occupational therapy with the following deficits: decreased strength, decreased coordination, decreased bilateral UE functional use, pain which impedes performance of ADLS/ IADLS. Pt can benefit from skilled occupational therapy to maximize safety and I with daily activities. Pt lives with her dtr and pt has a [place] aide that assists several hours per day. Pt requires assist with ADLs/ home management. Pt goes to pain managment with Dr. Brien Few. PMH: bilateral carpal tunnel release, anxiety/ depression, bulging disc in back and torn MCL, per pt report    OT Occupational Profile and History  Problem Focused Assessment - Including review of records relating to presenting problem    Occupational performance deficits (Please refer to evaluation for details):  ADL's;IADL's;Rest and Sleep;Work;Play;Leisure;Social Participation    Body  Structure / Function / Physical Skills  ADL;Edema;Flexibility;GMC;ROM;UE functional use;Strength;Pain;Mobility;FMC;Endurance;Balance;Coordination;Dexterity;IADL;Tone;Sensation    Rehab Potential  Good    Clinical Decision Making  Limited treatment options, no task modification necessary    Comorbidities Affecting Occupational Performance:  None  Modification or Assistance to Complete Evaluation   No modification of tasks or assist necessary to complete eval    OT Frequency  1x / week   1x week x 3 weeks followed by 2x week x 4 weeks   OT Treatment/Interventions  Self-care/ADL training;Electrical Stimulation;Therapeutic exercise;Fluidtherapy;Energy conservation;Therapeutic activities;Patient/family education;Splinting;Passive range of motion;Manual Therapy;DME and/or AE instruction;Neuromuscular education;Contrast Bath;Paraffin;Ultrasound    Plan  fluidotherapy/ [place], add to HEP, scar massage    Consulted and Agree with Plan of Care  Patient       Patient will benefit from skilled therapeutic intervention in order to improve the following deficits and impairments:  Body Structure / Function / Physical Skills  Visit Diagnosis: Pain in left hand - Plan: Ot plan of care cert/re-cert  Muscle weakness (generalized) - Plan: Ot plan of care cert/re-cert  Stiffness of left hand, not elsewhere classified - Plan: Ot plan of care cert/re-cert  Stiffness of right hand, not elsewhere classified - Plan: Ot plan of care cert/re-cert  Other lack of coordination - Plan: Ot plan of care cert/re-cert    Problem List Patient Active Problem List   Diagnosis Date Noted  . Chronic back pain   . Special screening for malignant neoplasms, colon 08/16/2017  . Glucose intolerance 02/13/2017  . Leg swelling 02/13/2017  . Chronic constipation 11/20/2016  . Hot flashes 11/20/2016  . Tear of MCL (medial collateral ligament) of knee, left, subsequent encounter 04/06/2016  . Anterolisthesis 04/06/2016  .  Situational mixed anxiety and depressive disorder 02/26/2016  . Insomnia 02/24/2016  . Back pain with radiation 02/14/2016  . Essential hypertension, benign 12/26/2015  . DDD (degenerative disc disease), lumbar 12/26/2015  . Seasonal allergies 12/26/2015  . Obesity 04/06/2015  . Anemia   . Asthma   . Depression   . Hyperlipidemia     Mckynlee Luse 08/19/2018, 1:05 PM Theone Murdoch, OTR/L [phone] Phone: [phone] 1:05 PM 08/19/18 [facility] [address] [place], [place], [address] Phone: [phone]   Fax:  [phone]  Name: Melody Jenkins MRN: [id number] Date of Birth: January 06, 1966

## 2018-08-20 ENCOUNTER — Ambulatory Visit (HOSPITAL_COMMUNITY): Payer: Medicaid Other | Admitting: Licensed Clinical Social Worker

## 2018-08-22 ENCOUNTER — Telehealth: Payer: Self-pay | Admitting: Gastroenterology

## 2018-08-22 NOTE — Telephone Encounter (Signed)
Pt called would like to get samples for the med MOTEGRITY due to her medication not being approved yet.

## 2018-08-22 NOTE — Telephone Encounter (Signed)
Pt aware to pick up samples at the front desk.

## 2018-08-26 ENCOUNTER — Encounter (HOSPITAL_COMMUNITY): Payer: Self-pay | Admitting: Licensed Clinical Social Worker

## 2018-08-26 ENCOUNTER — Ambulatory Visit (INDEPENDENT_AMBULATORY_CARE_PROVIDER_SITE_OTHER): Payer: Medicaid Other | Admitting: Licensed Clinical Social Worker

## 2018-08-26 ENCOUNTER — Other Ambulatory Visit: Payer: Self-pay

## 2018-08-26 DIAGNOSIS — F4310 Post-traumatic stress disorder, unspecified: Secondary | ICD-10-CM

## 2018-08-26 DIAGNOSIS — F332 Major depressive disorder, recurrent severe without psychotic features: Secondary | ICD-10-CM

## 2018-08-26 NOTE — Progress Notes (Signed)
   THERAPIST PROGRESS NOTE  Session Time: 11:10-12pm  Participation Level: Active  Behavioral Response: Casual/Alert/Depressed   Type of Therapy: Individual Therapy  Treatment Goals addressed: Improve psychiatric symptoms, Controlled Behavior, Moderated Mood, Improve Unhelpful Thought Patterns, Emotional Regulation Skills (Moderate moods, anger management, stress management), Feel and express a full Range of Emotions, Learn about Diagnosis, Healthy Coping Skills.     Interventions: CBT  Summary: Melody Jenkins is a 52 y.o. female presents for her individual  counseling session. Pt discussed her psychiatric symptoms and current life events. Pt presented with a mask and gloves on because of the coronavirus. She admits to watching the news too  Much which has increased her anxiety. Pt continues to be in incredible pain. She is in physical therapy for her wrist. As patient continues with her court case and disability case she is constantly reminded of her fall and the death of her fiance. Pt verbalized her feelings about her current life tearfully (losing job, chronic pain, continued health issues due to her fall, loss of her fiance). Validated her feelings. Pointed out to patient that her unresolved grief is increasing her depression. Assisted patient with steps of dealing with unresolved grief. Patient was receptive. Continue to reinforce the importance of self care.      Suicidal/Homicidal: Nowithout intent/plan  Therapist Response: Assessed pt's current functioning and reviewed progress. Assisted pt processing pain, validating feelings of current life, steps of dealing with unresolved grief.  Assisted pt  processing for management of stressors.  Plan: see again in 2 weeks  Diagnosis: Axis I: MDD, recurrent severe without psychosis, GAD        MACKENZIE,LISBETH S, LCAS  08/26/2018

## 2018-08-27 ENCOUNTER — Ambulatory Visit (HOSPITAL_COMMUNITY): Payer: Medicaid Other | Admitting: Licensed Clinical Social Worker

## 2018-08-27 NOTE — Telephone Encounter (Signed)
Phoned [employer] at [phone] and initiated PA for Motegrity 2mg  once daily.  The preferred drugs are Movantik, Amitiza and Linzess under her insurance, Medicaid. Patient has tried and failed Linzess 145 and 290 and has been on Motegrity with good management of her symptoms since July 2019.   They will make a decision within 24 hours. I Can call Thursday around 4:30pm or see if pharmacy can try to send it through again.

## 2018-08-27 NOTE — Telephone Encounter (Signed)
Pt informed that [employer] requires a PA for Montegrity.  Please call [phone]

## 2018-08-28 NOTE — Telephone Encounter (Signed)
PA for Motegrity 2mg  tablets approved from 08-27-2018 to 08-22-2019. TN#:[id number].

## 2018-08-29 ENCOUNTER — Encounter

## 2018-09-01 ENCOUNTER — Ambulatory Visit: Payer: 59 | Admitting: Obstetrics and Gynecology

## 2018-09-01 ENCOUNTER — Telehealth: Payer: Self-pay | Admitting: Occupational Therapy

## 2018-09-01 ENCOUNTER — Telehealth: Payer: Self-pay | Admitting: Obstetrics and Gynecology

## 2018-09-01 NOTE — Telephone Encounter (Signed)
Patient was contacted today regarding the temporary closing of [facility] due to Covid-19.  Therapist discussed:  Importance of continued HEP, therapist verbally reviewed tenon gliding and scar massage. Pt verbalized understanding.    [facility] will follow up with patients when we are able to resume care.  Theone Murdoch, OTR/L [phone] Phone: [phone] 3:41 PM 09/01/18 [facility] [address] [place], [place]  [address] Phone:  [phone] Fax:  [phone] \

## 2018-09-01 NOTE — Telephone Encounter (Signed)
Message left to return call to Lynne Righi at [phone].    

## 2018-09-01 NOTE — Telephone Encounter (Signed)
Patient arrived to the office for her canceled aex  today. I informed her we will notify her once the routine schedule is open again. She stated she is "having issues and not sure if she needs a follow up appointment"?

## 2018-09-02 ENCOUNTER — Ambulatory Visit: Payer: Medicaid Other | Admitting: Family Medicine

## 2018-09-02 ENCOUNTER — Encounter: Payer: Self-pay | Admitting: Family Medicine

## 2018-09-02 ENCOUNTER — Other Ambulatory Visit: Payer: Self-pay

## 2018-09-02 VITALS — BP 138/72 | HR 82 | Temp 98.3°F | Resp 14 | Ht 67.0 in | Wt 251.0 lb

## 2018-09-02 DIAGNOSIS — M25462 Effusion, left knee: Secondary | ICD-10-CM | POA: Diagnosis not present

## 2018-09-02 DIAGNOSIS — S83412D Sprain of medial collateral ligament of left knee, subsequent encounter: Secondary | ICD-10-CM | POA: Diagnosis not present

## 2018-09-02 DIAGNOSIS — M1712 Unilateral primary osteoarthritis, left knee: Secondary | ICD-10-CM

## 2018-09-02 DIAGNOSIS — M722 Plantar fascial fibromatosis: Secondary | ICD-10-CM

## 2018-09-02 DIAGNOSIS — E7439 Other disorders of intestinal carbohydrate absorption: Secondary | ICD-10-CM

## 2018-09-02 MED ORDER — BLOOD GLUCOSE TEST VI STRP: ORAL_STRIP | 1 refills | Status: DC

## 2018-09-02 MED ORDER — LANCETS MISC: 1 refills | Status: DC

## 2018-09-02 MED ORDER — BLOOD GLUCOSE SYSTEM PAK KIT: PACK | 0 refills | Status: DC

## 2018-09-02 NOTE — Patient Instructions (Addendum)
Use ice and stretch heel Ask to change Meloxicam ( different anti-inflammatory such Diclofenac 75mg  BID)  Referral to orthopedics  F/U as previous

## 2018-09-02 NOTE — Assessment & Plan Note (Signed)
Will get glucometer for pt to test CBG, since she has been on multiple rounds of steroids, goal fasting <120

## 2018-09-02 NOTE — Assessment & Plan Note (Signed)
Referral to orthopedics, the antalgic gait and knee issue likely contributed to her heel/foot pain

## 2018-09-02 NOTE — Progress Notes (Signed)
Subjective:    Patient ID: Melody Jenkins, female    DOB: 1965-11-15, 53 y.o.   MRN: [id number]  Patient presents for L Knee Pain (intermittent edema and pain at [facility] tear) and L Heel Pain (was advised that area is plantar fascitisis )  Patient here with progressive knee pain left knee pain for the past month or so.  She has history of chronic knee pain has known osteoarthritis and MCL tear after a fall back in 2017. Also complains of left heel pain states that when her knee has been swollen and painful giving out she often walks on the front of her foot because it goes down into her heel.  Now when she places her foot flat she gets pain in the back of the heel to the midfoot.   She has chronic back pain which is already being treated by Dr. Saintclair Halsted and Dr. Brien Few.  She is currently on Flexeril tramadol and meloxicam and will have her pain medication changed tomorrow.  She was on oral steroids 2 weeks ago secondary to inflammation in her hand after her carpal tunnel surgery she was also given a steroid injection last Thursday in her back but it did not take.  She was told to come in for referral to orthopedics for her knee pain as well as left heel pain.   Declines A1C testing today, wants to do at next scheduled visit in May    IMPRESSION: 1. Torn MCL proximally, without total detachment. Surrounding edema. 2. Moderate track of mid tell this creates that moderate tricompartmental chondral thinning, with fissuring along the patellar cartilage and femoral trochlear groove and tricompartmental spurring.   Review Of Systems:  GEN- denies fatigue, fever, weight loss,weakness, recent illness HEENT- denies eye drainage, change in vision, nasal discharge, CVS- denies chest pain, palpitations RESP- denies SOB, cough, wheeze ABD- denies N/V, change in stools, abd pain GU- denies dysuria, hematuria, dribbling, incontinence MSK- + joint pain, muscle aches, injury Neuro- denies headache, dizziness,  syncope, seizure activity       Objective:    BP 138/72   Pulse 82   Temp 98.3 F (36.8 C) (Oral)   Resp 14   Ht 5\' 7"  (1.702 m)   Wt 251 lb (113.9 kg)   SpO2 98%   BMI 39.31 kg/m  GEN- NAD, alert and oriented x3 MSK- Left knee- mild effusion/swelling, decreased  ROM, TTP medially and post knee, +crepitus, left foot/ankle, mild swelling mid foot, TTP at heel and plantar fascia insertion, good ROM ankle, antalgic gait  EXT- trace left edema Pulses- Radial, DP- 2+        Assessment & Plan:      Problem List Items Addressed This Visit      Unprioritized   Glucose intolerance    Will get glucometer for pt to test CBG, since she has been on multiple rounds of steroids, goal fasting <120      Tear of MCL (medial collateral ligament) of knee, left, subsequent encounter    Referral to orthopedics, the antalgic gait and knee issue likely contributed to her heel/foot pain      Relevant Orders   Ambulatory referral to Orthopedic Surgery    Other Visit Diagnoses    Plantar fasciitis, left    -  Primary   On NSAID, can be changed by pain physician tomorrow, recommend trying diclofenac, since mobic not helping, would not give more steroids at this time   Relevant Orders   Ambulatory referral  to Orthopedic Surgery   Primary osteoarthritis of left knee       Relevant Orders   Ambulatory referral to Orthopedic Surgery   Effusion of bursa of left knee       Relevant Orders   Ambulatory referral to Orthopedic Surgery      Note: This dictation was prepared with Dragon dictation along with smaller phrase technology. Any transcriptional errors that result from this process are unintentional.

## 2018-09-03 ENCOUNTER — Ambulatory Visit (INDEPENDENT_AMBULATORY_CARE_PROVIDER_SITE_OTHER): Payer: Medicaid Other | Admitting: Licensed Clinical Social Worker

## 2018-09-03 ENCOUNTER — Encounter (HOSPITAL_COMMUNITY): Payer: Self-pay | Admitting: Licensed Clinical Social Worker

## 2018-09-03 DIAGNOSIS — F411 Generalized anxiety disorder: Secondary | ICD-10-CM

## 2018-09-03 DIAGNOSIS — F332 Major depressive disorder, recurrent severe without psychotic features: Secondary | ICD-10-CM | POA: Diagnosis not present

## 2018-09-03 NOTE — Progress Notes (Signed)
Virtual Visit via Telephone Note  I connected with Melody Jenkins on 09/03/18 at 11:00 AM EDT by telephone and verified that I am speaking with the correct person using two identifiers.   I discussed the limitations, risks, security and privacy concerns of performing an evaluation and management service by telephone and the availability of in person appointments. I also discussed with the patient that there may be a patient responsible charge related to this service. The patient expressed understanding and agreed to proceed.   History of Present Illness: Pt was referred to therapy by her psychiatrist Dr. Doyne Keel, here at [facility] due to the death of her fiance and a slip and fall at a store which has placed pt out on disability and a change in her lifestyle.    Observations/Objective: Pt presented anxious over the telephone. Her anxiety has increased due to the implications of the DXAJO-87 virus. Objective: Display a full range of emotions without experiencing loss of control. Pt described her recent dr appts and calls about her pending court case. Pt became emotional on the phone which exacerbates her chronic pain. Asked open ended questions about the negative impact of her trauma on her feelings about the Covid-19 virus.  Asked open ended questions about her consistent following of the news and how it is manifesting into higher anxiety levels. Emailed pt a list of Clare Gandy Talks to watch instead of consistent viewing of the news.   Assessment and Plan: Continue to see pt weekly via webex or telephone.   Follow Up Instructions:    I discussed the assessment with the patient. The patient was provided an opportunity to ask questions and all were answered. The patient agreed with the plan and demonstrated an understanding of the instructions.   The patient was advised to call back or seek an in-person evaluation if the symptoms worsen or if the condition fails to improve as anticipated.  I provided 40  minutes of non-face-to-face time during this encounter.   MACKENZIE,LISBETH S, LCAS

## 2018-09-04 ENCOUNTER — Ambulatory Visit: Payer: Medicaid Other | Admitting: Occupational Therapy

## 2018-09-04 NOTE — Telephone Encounter (Signed)
Spoke with patient. Patient states that she was previously evaluated for PMB and does not know if this appointment was for her aex only or aex and recheck of PMB  Last menstrual period was 05/19/2016 (approximate).  In the spring the patient was evaluated for PMP bleeding. Inactive endometrium on biopsy, fibroid uterus, but negative sonohysterogram, AGUS pap, negative HPV, unsatisfactory colposcopy, benign ECC.Last [facility] was just in the menopausal range.  She took the provera in July and had a cycle. No cycle in May after taking provera, she took it last week and had no bleeding. Took Provera again in Sept and did not have any bleeding. Has not had any additional bleeding or pain. Advised okay to follow up with regular annual exam after Covid 19 states of emergency is lifted. Patient verbalizes understanding. Aware she will be contacted to reschedule.  Routing to provider and will close encounter.

## 2018-09-09 ENCOUNTER — Ambulatory Visit (INDEPENDENT_AMBULATORY_CARE_PROVIDER_SITE_OTHER): Payer: Medicaid Other | Admitting: Licensed Clinical Social Worker

## 2018-09-09 ENCOUNTER — Other Ambulatory Visit: Payer: Self-pay

## 2018-09-09 DIAGNOSIS — F332 Major depressive disorder, recurrent severe without psychotic features: Secondary | ICD-10-CM | POA: Diagnosis not present

## 2018-09-09 DIAGNOSIS — F411 Generalized anxiety disorder: Secondary | ICD-10-CM | POA: Diagnosis not present

## 2018-09-11 ENCOUNTER — Ambulatory Visit (INDEPENDENT_AMBULATORY_CARE_PROVIDER_SITE_OTHER): Payer: Medicaid Other | Admitting: Orthopaedic Surgery

## 2018-09-11 ENCOUNTER — Ambulatory Visit (INDEPENDENT_AMBULATORY_CARE_PROVIDER_SITE_OTHER): Payer: Medicaid Other

## 2018-09-11 ENCOUNTER — Encounter: Payer: Self-pay | Admitting: Occupational Therapy

## 2018-09-11 ENCOUNTER — Other Ambulatory Visit: Payer: Self-pay

## 2018-09-11 ENCOUNTER — Encounter (INDEPENDENT_AMBULATORY_CARE_PROVIDER_SITE_OTHER): Payer: Self-pay | Admitting: Orthopaedic Surgery

## 2018-09-11 DIAGNOSIS — G8929 Other chronic pain: Secondary | ICD-10-CM

## 2018-09-11 DIAGNOSIS — M25562 Pain in left knee: Secondary | ICD-10-CM

## 2018-09-11 MED ORDER — LIDOCAINE HCL 1 % IJ SOLN
3.0000 mL | INTRAMUSCULAR | Status: AC | PRN
  Administered 2018-09-11: 10:00:00 3 mL

## 2018-09-11 MED ORDER — METHYLPREDNISOLONE ACETATE 40 MG/ML IJ SUSP
40.0000 mg | INTRAMUSCULAR | Status: AC | PRN
  Administered 2018-09-11: 40 mg via INTRA_ARTICULAR

## 2018-09-11 NOTE — Progress Notes (Addendum)
Office Visit Note   Patient: Melody Jenkins           Date of Birth: March 06, 1966           MRN: [id number] Visit Date: 09/11/2018              Requested by: Alycia Rossetti, MD [address] [place], [place] [address] PCP: Alycia Rossetti, MD   Assessment & Plan: Visit Diagnoses:  1. Chronic pain of left knee     Plan: I do feel an MRI of her left knee at this point is warranted to assess the MCL as well as assess the cartilage in medial compartment of her knee.  I explained the rationale behind this as well.  I did recommend at least an aspiration of her knee and a steroid injection in the left knee.  I was able to aspirate about 20 cc of fluid from the knee that was unremarkable appearing.  I placed a steroid injection in her knee as well.  She will continue her cane and we will work on obtaining an MRI of her left knee.  She understands there may be a delay likely due to the coronavirus pandemic.  She has had multiple modalities of conservative treatment over several years including steroid injections, quad strengthening exercises and physical therapy as well as activity modification and anti-inflammatories.  Follow-Up Instructions: after MRI  Orders:  Orders Placed This Encounter  Procedures   Large Joint Inj   XR Knee 1-2 Views Left   No orders of the defined types were placed in this encounter.     Procedures: Large Joint Inj: L knee on 09/11/2018 9:46 AM Indications: diagnostic evaluation and pain Details: 22 G 1.5 in needle, superolateral approach  Arthrogram: No  Medications: 3 mL lidocaine 1 %; 40 mg methylPREDNISolone acetate 40 MG/ML Outcome: tolerated well, no immediate complications Procedure, treatment alternatives, risks and benefits explained, specific risks discussed. Consent was given by the patient. Immediately prior to procedure a time out was called to verify the correct patient, procedure, equipment, support staff and site/side marked as required.  Patient was prepped and draped in the usual sterile fashion.       Clinical Data: No additional findings.   Subjective: Chief Complaint  Patient presents with   Left Knee - Pain  The patient is a 53 year old female that I am seeing for the first time however she did see my partner Dr. Marlou Sa 3 years ago for a partial MCL tear of her left knee.  That was after a mechanical fall.  She had never had knee issues before that.  She says is never felt better since then.  The MRI of her knee in 2017 did show a partial MCL tear of the left knee with no detachment.  There was moderate thinning of the cartilage throughout her knee.  She states that she was unaware of her arthritic changes in her knee.  She does still ambulate using a cane.  She has a lot of pain on the medial aspect of her left knee.  She is also dealing with left foot plantar fasciitis due to the way she walks and she did have an epidural steroid in her lumbar spine yesterday due to chronic spine issues.  HPI  Review of Systems She currently denies any fever, chills, nausea, chest pain, shortness of breath  Objective: Vital Signs: There were no vitals taken for this visit.  Physical Exam She is alert  and oriented and in no acute distress.  She is ambulating slowly with a cane and has a limp to her gait. Ortho Exam Examination of her left knee shows a mild effusion.  Her knee is ligamentously stable with full range of motion but she has significant tenderness along the medial collateral ligament and the medial compartment of her knee.  There is no instability with varus and valgus stressing of the knee. Specialty Comments:  No specialty comments available.  Imaging: Xr Knee 1-2 Views Left  Result Date: 09/11/2018 An AP and lateral of the left knee show no acute findings.  The joint space is still well-maintained.  There is significant patellofemoral arthritic changes.    PMFS History: Patient Active Problem List    Diagnosis Date Noted   Chronic back pain    Special screening for malignant neoplasms, colon 08/16/2017   Glucose intolerance 02/13/2017   Leg swelling 02/13/2017   Chronic constipation 11/20/2016   Hot flashes 11/20/2016   Tear of MCL (medial collateral ligament) of knee, left, subsequent encounter 04/06/2016   Anterolisthesis 04/06/2016   Situational mixed anxiety and depressive disorder 02/26/2016   Insomnia 02/24/2016   Back pain with radiation 02/14/2016   Essential hypertension, benign 12/26/2015   DDD (degenerative disc disease), lumbar 12/26/2015   Seasonal allergies 12/26/2015   Obesity 04/06/2015   Anemia    Asthma    Depression    Hyperlipidemia    Past Medical History:  Diagnosis Date   Anemia    Anxiety    Asthma    Carpal tunnel syndrome    Chronic back pain    Depression    History of multiple miscarriages    Hyperlipidemia     Family History  Problem Relation Age of Onset   Diabetes Mother    Hypertension Mother    Hypertension Father    Heart disease Father    Early death Brother    Kidney disease Maternal Grandmother    Heart disease Brother    Colon cancer Neg Hx    Rectal cancer Neg Hx     Past Surgical History:  Procedure Laterality Date   BREAST SURGERY  2000   reduction   CESAREAN SECTION     COSMETIC SURGERY  2000   breast reduction   REDUCTION MAMMAPLASTY Bilateral    Social History   Occupational History   Occupation: disablity  Tobacco Use   Smoking status: Former Smoker    Packs/day: 0.25    Years: 30.00    Pack years: 7.50    Types: Cigarettes    Last attempt to quit: 03/29/2018    Years since quitting: 0.4   Smokeless tobacco: Never Used  Substance and Sexual Activity   Alcohol use: No   Drug use: No   Sexual activity: Not Currently    Partners: Male    Birth control/protection: Post-menopausal

## 2018-09-14 ENCOUNTER — Encounter (HOSPITAL_COMMUNITY): Payer: Self-pay | Admitting: Licensed Clinical Social Worker

## 2018-09-14 NOTE — Progress Notes (Addendum)
Virtual Visit via Video Note  I connected with Melody Jenkins on 09/09/2018 at 11:00 AM EDT by a video enabled telemedicine application and verified that I am speaking with the correct person using two identifiers.   I discussed the limitations of evaluation and management by telemedicine and the availability of in person appointments. The patient expressed understanding and agreed to proceed.  History of Present Illness: Pt was referred to psychiatary and OP therapy after a slip and fall causing chronic pain and the immediate thereafter death of her fiance.     Observations/Objective: Display a full range of emotions without losing control. Pt presents  Anxious, depressed and experiencing chronic pain.   Assessment and Plan: Assisted pt using socratic questions in dealing with her negative thoughts. Assisted pt by reviewing current coping skills that are currently working and what has worked in the past. Restaurant manager, fast food pt "coping skills for extreme worry and anxiety admidst global uncertainity."  I provided 50 minutes of non-face-to-face time during this encounter.   MACKENZIE,LISBETH S, LCAS

## 2018-09-17 ENCOUNTER — Telehealth: Payer: Self-pay | Admitting: Occupational Therapy

## 2018-09-17 ENCOUNTER — Encounter: Payer: Self-pay | Admitting: Occupational Therapy

## 2018-09-17 ENCOUNTER — Encounter (HOSPITAL_COMMUNITY): Payer: Self-pay | Admitting: Licensed Clinical Social Worker

## 2018-09-17 ENCOUNTER — Other Ambulatory Visit: Payer: Self-pay

## 2018-09-17 ENCOUNTER — Ambulatory Visit (INDEPENDENT_AMBULATORY_CARE_PROVIDER_SITE_OTHER): Payer: Medicaid Other | Admitting: Licensed Clinical Social Worker

## 2018-09-17 DIAGNOSIS — F332 Major depressive disorder, recurrent severe without psychotic features: Secondary | ICD-10-CM

## 2018-09-17 DIAGNOSIS — F411 Generalized anxiety disorder: Secondary | ICD-10-CM

## 2018-09-17 NOTE — Telephone Encounter (Signed)
Message left for patient regarding scheduling 1 in clinic visit between now and 4/15 vs. Pt's interest in telehealth. Pt was instructed to call the office if she is interested in either. Theone Murdoch, OTR/L [phone] Phone: [phone] 3:42 PM 09/17/18

## 2018-09-17 NOTE — Progress Notes (Signed)
Virtual Visit via Video Note  I connected with Melody Jenkins on 09/17/18 at 11:00 AM EDT by a video enabled telemedicine application and verified that I am speaking with the correct person using two identifiers.   I discussed the limitations of evaluation and management by telemedicine and the availability of in person appointments. The patient expressed understanding and agreed to proceed.  History of Present Illness:    Observations/Objective: Objective; Describe signs and symptoms of depression currently experiencing.  Pt presents anxious and depressed via webex.   Assessment and Plan: Pt is overwhelmed with all of her current issues: lawsuit pending, upcoming disability hearing, $ issues, mother broke her arm, daughter living with her, coronavirus, fear and worry. Assisted pt compartmentalizing all of her issues. Emailed pt "Guide to living with worry and anxiety amidst global uncertainty." Reviewed worry   And anxiety triggers, ambiguous, new and unpredictable. Assisted pt with  Identifying her triggers.                    Follow Up Instructions:    I discussed the assessment and treatment plan with the patient. The patient was provided an opportunity to ask questions and all were answered. The patient agreed with the plan and demonstrated an understanding of the instructions.   The patient was advised to call back or seek an in-person evaluation if the symptoms worsen or if the condition fails to improve as anticipated.  I provided 50 minutes of non-face-to-face time during this encounter.   MACKENZIE,LISBETH S, LCAS

## 2018-09-22 ENCOUNTER — Other Ambulatory Visit: Payer: Self-pay | Admitting: Family Medicine

## 2018-09-23 ENCOUNTER — Encounter: Payer: Self-pay | Admitting: Gastroenterology

## 2018-09-23 ENCOUNTER — Other Ambulatory Visit (INDEPENDENT_AMBULATORY_CARE_PROVIDER_SITE_OTHER): Payer: Self-pay | Admitting: Orthopaedic Surgery

## 2018-09-23 ENCOUNTER — Telehealth (INDEPENDENT_AMBULATORY_CARE_PROVIDER_SITE_OTHER): Payer: Self-pay | Admitting: *Deleted

## 2018-09-23 ENCOUNTER — Ambulatory Visit (INDEPENDENT_AMBULATORY_CARE_PROVIDER_SITE_OTHER): Payer: Medicaid Other | Admitting: Gastroenterology

## 2018-09-23 ENCOUNTER — Ambulatory Visit: Payer: Medicaid Other | Attending: Neurosurgery | Admitting: Occupational Therapy

## 2018-09-23 ENCOUNTER — Other Ambulatory Visit: Payer: Self-pay

## 2018-09-23 VITALS — Ht 67.75 in | Wt 245.0 lb

## 2018-09-23 DIAGNOSIS — M25641 Stiffness of right hand, not elsewhere classified: Secondary | ICD-10-CM | POA: Diagnosis present

## 2018-09-23 DIAGNOSIS — M79642 Pain in left hand: Secondary | ICD-10-CM | POA: Diagnosis not present

## 2018-09-23 DIAGNOSIS — K649 Unspecified hemorrhoids: Secondary | ICD-10-CM | POA: Diagnosis not present

## 2018-09-23 DIAGNOSIS — F1190 Opioid use, unspecified, uncomplicated: Secondary | ICD-10-CM | POA: Diagnosis not present

## 2018-09-23 DIAGNOSIS — M6281 Muscle weakness (generalized): Secondary | ICD-10-CM | POA: Diagnosis present

## 2018-09-23 DIAGNOSIS — M25562 Pain in left knee: Principal | ICD-10-CM

## 2018-09-23 DIAGNOSIS — M25642 Stiffness of left hand, not elsewhere classified: Secondary | ICD-10-CM | POA: Diagnosis present

## 2018-09-23 DIAGNOSIS — K5909 Other constipation: Secondary | ICD-10-CM | POA: Diagnosis not present

## 2018-09-23 DIAGNOSIS — R278 Other lack of coordination: Secondary | ICD-10-CM | POA: Diagnosis present

## 2018-09-23 DIAGNOSIS — G8929 Other chronic pain: Secondary | ICD-10-CM

## 2018-09-23 NOTE — Progress Notes (Signed)
Virtual Visit via Video Note  I connected with [name] on 09/23/18 at 10:00 AM EDT by a video enabled telemedicine application and verified that I am speaking with the correct person using two identifiers.   I discussed the limitations of evaluation and management by telemedicine and the availability of in person appointments. The patient expressed understanding and agreed to proceed.  THIS ENCOUNTER IS A VIRTUAL VISIT DUE TO COVID-19 - PATIENT WAS NOT SEEN IN THE OFFICE. PATIENT HAS CONSENTED TO VIRTUAL VISIT / TELEMEDICINE VISIT USING [employer] APP   Location of patient: home Location of provider: office Persons participating: myself, patient   HPI :   53 y/o female here for a follow up visit. I have seen her in the past for chronic constipation and hemorrhoids.  Colonoscopy on 08/2017, multiple adenomas / sessile serrated polyps. She had internal hemorrhoids and underwent banding 6-12/2017 which worked well for her hemorrhoid symptoms.  Previously on high dose linzess for constipation which stopped working. Transitioned to Motegrity 41m in July 2019. She reports doing okay. In general motegirty has worked well since she started it. She thinks about 2 months ago she had a hard time with worsening constipation, she thinks due to stress in light of the Coronavirus, and added Miralax during that time. If she does not use the medication she has trouble using the bathroom. On the present regimen she is using the bathroom about 2 times per day, depending on what she is eating. She has mostly been using MLutcherrecently, not needed miralax lately. Hemorrhoids had done well with the banding, had some mild symptoms with worsening constipation but nothing worrisome. No bleeding. She had been on tramadol in the past for her back pain which has worsened, and now on oxycodone and gabepentin for her chronic pain for chronic back pain. No abdominal pains. Otherwise doing okay.  Colonoscopy 09/05/2017  -- Multiple medium-mouthed diverticula were found in the entire colon. 2 adenomas, one sessile serrated polyp, Internal hemorrhoids were found during retroflexion. Repeat in 3 years   Past Medical History:  Diagnosis Date   Anemia    Anxiety    Asthma    Carpal tunnel syndrome    Chronic back pain    Depression    History of multiple miscarriages    Hyperlipidemia      Past Surgical History:  Procedure Laterality Date   BREAST SURGERY  2000   reduction   CESAREAN SECTION     COSMETIC SURGERY  2000   breast reduction   REDUCTION MAMMAPLASTY Bilateral    Family History  Problem Relation Age of Onset   Diabetes Mother    Hypertension Mother    Hypertension Father    Heart disease Father    Early death Brother    Kidney disease Maternal Grandmother    Heart disease Brother    Colon cancer Neg Hx    Rectal cancer Neg Hx    Social History   Tobacco Use   Smoking status: Former Smoker    Packs/day: 0.25    Years: 30.00    Pack years: 7.50    Types: Cigarettes    Last attempt to quit: 03/29/2018    Years since quitting: 0.4   Smokeless tobacco: Never Used  Substance Use Topics   Alcohol use: No   Drug use: No   Current Outpatient Medications  Medication Sig Dispense Refill   Multiple Vitamins-Minerals (AIRBORNE PO) Take by mouth daily.     oxyCODONE-acetaminophen (PERCOCET/ROXICET)  5-325 MG tablet Take by mouth 3 (three) times daily.     Biotin 5000 MCG CAPS Take 1 capsule by mouth.     Blood Glucose Monitoring Suppl (BLOOD GLUCOSE SYSTEM PAK) KIT Please dispense based on patient and insurance preference. Use as directed to monitor FSBS 1x daily. Dx: R73.09. 1 each 0   Cholecalciferol (VITAMIN D3) 25 MCG (1000 UT) CAPS Take by mouth 2 (two) times daily.     clindamycin-benzoyl peroxide (BENZACLIN) gel APPLY TOPICALLY TO FACE TWICE A DAY 25 g 2   cloNIDine (CATAPRES) 0.2 MG tablet Take 1 tablet (0.2 mg total) by mouth 2 (two) times  daily. 60 tablet 2   Cyanocobalamin (VITAMIN B 12 PO) Take 1 tablet by mouth daily.      cyclobenzaprine (FLEXERIL) 10 MG tablet Take 1 tablet (10 mg total) by mouth 3 (three) times daily as needed for muscle spasms. 30 tablet 0   furosemide (LASIX) 20 MG tablet TAKE 1 TABLET BY MOUTH EVERY DAY 30 tablet 6   gabapentin (NEURONTIN) 300 MG capsule Take 600 mg by mouth 3 (three) times daily.     Glucose Blood (BLOOD GLUCOSE TEST STRIPS) STRP Please dispense based on patient and insurance preference. Use as directed to monitor FSBS 1x daily. Dx: R73.09. 100 each 1   hydrOXYzine (VISTARIL) 50 MG capsule Take 1 capsule (50 mg total) by mouth 3 (three) times daily as needed. 90 capsule 2   Lancets MISC Please dispense based on patient and insurance preference. Use as directed to monitor FSBS 1x daily. Dx: R73.09. 100 each 1   lithium carbonate (ESKALITH) 450 MG CR tablet Take 1 tablet (450 mg total) by mouth daily. 30 tablet 2   meloxicam (MOBIC) 7.5 MG tablet Take 7.5 mg by mouth 2 (two) times daily.  5   metFORMIN (GLUCOPHAGE) 500 MG tablet Take 1 tablet (500 mg total) by mouth daily with breakfast. 90 tablet 3   metoprolol succinate (TOPROL-XL) 100 MG 24 hr tablet Take 1 tablet (100 mg total) by mouth daily. Take with or immediately following a meal. 30 tablet 6   mirtazapine (REMERON) 45 MG tablet Take 1 tablet (45 mg total) by mouth at bedtime. 30 tablet 2   Prucalopride Succinate (MOTEGRITY) 2 MG TABS Take 2 mg by mouth daily. 30 tablet 0   vitamin C (ASCORBIC ACID) 500 MG tablet Take 500 mg by mouth daily.     vitamin E (VITAMIN E) 400 UNIT capsule Take 400 Units by mouth daily.     No current facility-administered medications for this visit.    Allergies  Allergen Reactions   Trazodone And Nefazodone Hives     Review of Systems: All systems reviewed and negative except where noted in HPI.    Xr Knee 1-2 Views Left  Result Date: 09/11/2018 An AP and lateral of the left  knee show no acute findings.  The joint space is still well-maintained.  There is significant patellofemoral arthritic changes.  Lab Results  Component Value Date   CREATININE 0.97 04/29/2018   BUN 14 04/29/2018   NA 140 04/29/2018   K 4.2 04/29/2018   CL 103 04/29/2018   CO2 27 04/29/2018   Lab Results  Component Value Date   ALT 17 04/29/2018   AST 15 04/29/2018   ALKPHOS 74 08/01/2017   BILITOT 0.4 04/29/2018    Lab Results  Component Value Date   WBC 5.5 01/13/2018   HGB 14.0 01/13/2018   HCT 44.9 01/13/2018  MCV 83.0 01/13/2018   PLT 241 01/13/2018      Physical Exam: Ht 5' 7.75" (1.721 m) Comment: pt provided over the phone   Wt 245 lb (111.1 kg) Comment: pt provided over the phone   BMI 37.53 kg/m   NA  ASSESSMENT AND PLAN:  53 y/o female here for reassessment of the following issues:  Chronic constipation / hemorrhoids / narcotic use - patient with baseline constipation, failed high dose Linzess, has done much better on Motegrity. Had some worsening recently, she thinks due to stress in light of the virus outbreak, but she also transitioned from tramadol to percocet, which likely has worsened her constipation. She needed to add some Miralax PRN but not as much recently and seems back to baseline. Hemorrhoids in general have not bothered her much since the banding. I would continue present regimen with the [place] if it works for her and she can afford it. She can add miralax PRN if she feels she needs it. If she thinks over time it is not working as well in light of narcotic use, can consider other options. Long term narcotics are not a great option for her pain management given her chronic bowel dysfunction. She can follow up as needed for this issue. Colonoscopy up to date, surveillance recommended in 2022. She agreed.  [place] Cellar, MD [facility]

## 2018-09-23 NOTE — Telephone Encounter (Signed)
Pt called stating was in office on 09/11/18 and saw Dr. Ninfa Linden and he was going to order an MRI of L knee. Pt was calling to inquire about it since she has not heard anything from it. I advised pt order was never put it in but I will place the order and try to get her scheduled. I also advised her that due to the coronvirus pandemic it may take longer to her scheduled. Pt voiced understanding and will wait on call to be scheduled.

## 2018-09-23 NOTE — Therapy (Signed)
[facility] [address] [place], [place], [address] Phone: [phone]   Fax:  [phone]  Occupational Therapy Treatment  Patient Details  Name: Melody Jenkins MRN: [id number] Date of Birth: 08/30/1965 Referring Provider (OT): Dr Saintclair Halsted   Encounter Date: 09/23/2018  OT End of Session - 09/23/18 1316    Visit Number  2    Number of Visits  12    Date for OT Re-Evaluation  10/18/18    Authorization Type  Medicaid     Authorization Time Period  3 visits approved through 4/15    OT Start Time  1208    OT Stop Time  1248    OT Time Calculation (min)  40 min    Activity Tolerance  Patient limited by pain    Behavior During Therapy  Anxious       Past Medical History:  Diagnosis Date  . Anemia   . Anxiety   . Asthma   . Carpal tunnel syndrome   . Chronic back pain   . Depression   . History of multiple miscarriages   . Hyperlipidemia     Past Surgical History:  Procedure Laterality Date  . BREAST SURGERY  2000   reduction  . CESAREAN SECTION    . COSMETIC SURGERY  2000   breast reduction  . REDUCTION MAMMAPLASTY Bilateral     There were no vitals filed for this visit.  Subjective Assessment - 09/23/18 1344    Pertinent History  RUE CTR 2018, LUE CTR Jan 2020, anxiety, depression, PTSD, torn MCL in LLE and bulging disc in back, see pain management    Patient Stated Goals  to be able to use her dominant LUE and to better grip items with both hands.    Currently in Pain?  Yes    Pain Score  8     Pain Location  Hand    Pain Orientation  Left    Pain Descriptors / Indicators  Aching    Pain Type  Chronic pain    Pain Onset  More than a month ago    Pain Frequency  Constant    Aggravating Factors   movement    Pain Relieving Factors  rest            Occupational  Therapy Telehealth Visit:  I connected with Melody Jenkins today at 12:08 by [employer] and verified that I am speaking  with the correct person using two identifiers.  I discussed the limitations, risks, security and privacy concerns of performing an evaluation and management service by Webex and the availability of in person appointments.  I also discussed with the patient that there may be a patient responsible charge related to this service. The patient expressed understanding and agreed to proceed.    The patient's address was confirmed.  Identified to the patient that therapist is a licensed OT in the state of [place].  Verified phone # as [phone] to call in case of technical difficulties.  Pt reports she has significant pain, swelling and a small open area at her incision. Pt was instructed to f/u with her physician by telephone today, and to hold off on scar massage until she receives further instructions from her MD. therapist was unable to fully visualize the open area as a part of video conference and pt may benefit from seeing her MD as she has not had a recent  f/u visit.  Therapist reviewed tendon gliding ex  with patient 2 sets of 2 reps, min-mod v.c for performance and significant encouragement provided Pt was instructed in wrist flexion/ extension, finger thumb opposition, and thumb flexion, 10 reps each, min v.c and demonstration provided by therapist. Pt was encouraged to perform exercises 4-6 x per day. Exercises were emailed to pt. Per her request.  Pt was also instructed to start using her LUE for teeth brushing bathing and light activities. Pt verbalized understanding. Pt agrees to a f/u telehealth visit next week. Therapist to request additional Medicaid visits.                OT Education - 09/23/18 1628    Education Details  Pt was instructed to call her MD due to pain, swelling and small open area,  and to hold off on scar massage until she speaks with MD, updated HEP- emailed to patient    Person(s) Educated  Patient    Methods  Explanation;Demonstration;Verbal cues     Comprehension  Verbalized understanding;Returned demonstration;Verbal cues required       OT Short Term Goals - 09/23/18 1327      OT [place] #1   Title  I with initial HEP    Baseline  Pt demonstrates understanding    Time  4    Period  Weeks    Status  Achieved    Target Date  09/18/18      OT SHORT TERM GOAL #2   Title  I with scar massage and edema control techniques.    Baseline  Pt needs continued reinforcement as hs has only been seen for eval plus 1 visit    Time  4    Period  Weeks    Status  On-going      OT SHORT TERM GOAL #3   Title  Pt will demonstrate at least 90% composite finger flexion for LUE in prep for functional use with pain no greater than 6/10    Baseline  70-75% composite finger flexion for LUE, pain 8/10    Time  4    Period  Weeks    Status  On-going      OT SHORT TERM GOAL #4   Title  Pt will demonstrate improved fine motor coordination as evidneced by perfroming 9 hole peg test in 45 secs or less for LUE.    Baseline  52.75 secs    Time  4    Period  Weeks    Status  Deferred   currently unable to assess due to telehealth visits       OT Long Term Goals - 09/23/18 1329      OT LONG TERM GOAL #1   Title  I with updated HEP.    Baseline  updated HEP not issued yet due to pt only being seen for 1 visit    Time  8    Period  Weeks    Status  On-going      OT LONG TERM GOAL #2   Title  Pt will report adequate LUE strength to pick up lightweight objects without dropping    Baseline  Pt reports she is unable to grip items without dropping- goal revised as pt is being seen for telehealth and therefore functional measurements will be used.    Time  8    Period  Weeks    Status  Revised      OT LONG TERM GOAL #3   Title  Pt will increase RUE grip  strength to 25 lbs or greater for increased functional use during ADLS.    Baseline  RUE 14 lbs    Time  8    Period  Weeks    Status  Deferred      OT LONG TERM GOAL #4   Title  Pt  will resume use of LUE as her dominant hand at least 50% of the time or greater with painless than or equal to 5/10.    Baseline  uses LUE as an  assist-25% of the time, Pain 8/10    Time  8    Period  Weeks    Status  On-going      OT LONG TERM GOAL #5   Title  Pt will report increased ease with brushing teeth, opening continers and curling her hair with LUE.    Baseline  Pt requires increased assistance due to limited LUE functional use and pain. Pt reports she is not consistently using her LUE for these activities    Time  8    Period  Weeks    Status  On-going            Plan - 09/23/18 1324    Clinical Impression Statement  Pt is progressing slowly towards goals limited by pain. Pt has only been seen for 1/3 approved visits due to the temporary reduction in clinic hours at OP rehab due to COVID-19.Pt can benefit from continued skilled occupational therapy to maximize pt's safety and I with ADLS and IADLS and to increase pt's functional use of LUE. Pt was seen for a telehealth visit today.    Occupational performance deficits (Please refer to evaluation for details):  ADL's;IADL's;Rest and Sleep;Work;Play;Leisure;Social Participation    Body Structure / Function / Physical Skills  ADL;Edema;Flexibility;GMC;ROM;UE functional use;Strength;Pain;Mobility;FMC;Endurance;Balance;Coordination;Dexterity;IADL;Tone;Sensation    Rehab Potential  Good    OT Frequency  1x / week    OT Duration  8 weeks    OT Treatment/Interventions  Self-care/ADL training;Electrical Stimulation;Therapeutic exercise;Fluidtherapy;Energy conservation;Therapeutic activities;Patient/family education;Splinting;Passive range of motion;Manual Therapy;DME and/or AE instruction;Neuromuscular education;Contrast Bath;Paraffin;Ultrasound    Plan  continue to progress HEP via telehealth    Consulted and Agree with Plan of Care  Patient       Patient will benefit from skilled therapeutic intervention in order to improve the  following deficits and impairments:  Body Structure / Function / Physical Skills  Visit Diagnosis: Pain in left hand  Muscle weakness (generalized)  Stiffness of left hand, not elsewhere classified  Other lack of coordination    Problem List Patient Active Problem List   Diagnosis Date Noted  . Chronic back pain   . Special screening for malignant neoplasms, colon 08/16/2017  . Glucose intolerance 02/13/2017  . Leg swelling 02/13/2017  . Chronic constipation 11/20/2016  . Hot flashes 11/20/2016  . Tear of MCL (medial collateral ligament) of knee, left, subsequent encounter 04/06/2016  . Anterolisthesis 04/06/2016  . Situational mixed anxiety and depressive disorder 02/26/2016  . Insomnia 02/24/2016  . Back pain with radiation 02/14/2016  . Essential hypertension, benign 12/26/2015  . DDD (degenerative disc disease), lumbar 12/26/2015  . Seasonal allergies 12/26/2015  . Obesity 04/06/2015  . Anemia   . Asthma   . Depression   . Hyperlipidemia     Maurie Olesen 09/23/2018, 4:30 PM Theone Murdoch, OTR/L [phone] Phone: [phone] 4:40 PM 09/23/18 [facility] [address] [place], [place], [address] Phone: [phone]   Fax:  [phone]  Name: Melody Exley  Jenkins MRN: [id number] Date of Birth: 11/20/1965

## 2018-09-23 NOTE — Patient Instructions (Signed)
Access Code: [email or web address]  URL: [email or web address]  Date: 09/23/2018  Prepared by: Theone Murdoch   Exercises Seated Digit Tendon Gliding - 10 reps - 1 sets - 4-6x daily - 7x weekly Wrist Flexion Extension AROM - 10 reps - 1 sets - 4-6x daily - 7x weekly Seated Thumb Composite Flexion AROM - 10 reps - 1 sets - 4-6x daily - 7x weekly Thumb Opposition - 10 reps - 2 sets - 4-6x daily - 7x weekly

## 2018-09-23 NOTE — Therapy (Deleted)
[facility] [address] [place], [place], [address] Phone: [phone]   Fax:  [phone]  Occupational Therapy Treatment  Patient Details  Name: Melody Jenkins MRN: [id number] Date of Birth: 03-28-1966 Referring Provider (OT): Dr Saintclair Halsted   Encounter Date: 09/23/2018  OT End of Session - 09/23/18 1316    Visit Number  2    Number of Visits  12    Date for OT Re-Evaluation  10/18/18    Authorization Type  Medicaid     Authorization Time Period  3 visits approved through 4/15    OT Start Time  1208    OT Stop Time  1248    OT Time Calculation (min)  40 min    Activity Tolerance  Patient limited by pain    Behavior During Therapy  Anxious       Past Medical History:  Diagnosis Date  . Anemia   . Anxiety   . Asthma   . Carpal tunnel syndrome   . Chronic back pain   . Depression   . History of multiple miscarriages   . Hyperlipidemia     Past Surgical History:  Procedure Laterality Date  . BREAST SURGERY  2000   reduction  . CESAREAN SECTION    . COSMETIC SURGERY  2000   breast reduction  . REDUCTION MAMMAPLASTY Bilateral     There were no vitals filed for this visit.  Subjective Assessment - 09/23/18 1344    Pertinent History  RUE CTR 2018, LUE CTR Jan 2020, anxiety, depression, PTSD, torn MCL in LLE and bulging disc in back, see pain management    Patient Stated Goals  to be able to use her dominant LUE and to better grip items with both hands.    Currently in Pain?  Yes    Pain Score  8     Pain Location  Hand    Pain Orientation  Left    Pain Descriptors / Indicators  Aching    Pain Type  Chronic pain    Pain Onset  More than a month ago    Pain Frequency  Constant    Aggravating Factors   movement    Pain Relieving Factors  rest                             OT Short Term Goals - 09/23/18 1327      OT SHORT TERM GOAL #1   Title  I with initial HEP    Baseline  Pt  demonstrates understanding    Time  4    Period  Weeks    Status  Achieved    Target Date  09/18/18      OT SHORT TERM GOAL #2   Title  I with scar massage and edema control techniques.    Baseline  Pt needs continued reinforcement as hs has only been seen for eval plus 1 visit    Time  4    Period  Weeks    Status  On-going      OT SHORT TERM GOAL #3   Title  Pt will demonstrate at least 90% composite finger flexion for LUE in prep for functional use with pain no greater than 6/10    Baseline  70-75% composite finger flexion for LUE, pain 8/10    Time  4    Period  Weeks    Status  On-going  OT SHORT TERM GOAL #4   Title  Pt will demonstrate improved fine motor coordination as evidneced by perfroming 9 hole peg test in 45 secs or less for LUE.    Baseline  52.75 secs    Time  4    Period  Weeks    Status  Deferred   currently unable to assess due to telehealth visits       OT Long Term Goals - 09/23/18 1329      OT LONG TERM GOAL #1   Title  I with updated HEP.    Baseline  updated HEP not issued yet due to pt only being seen for 1 visit    Time  8    Period  Weeks    Status  On-going      OT LONG TERM GOAL #2   Title  Pt will report adequate LUE strength to pick up lightweight objects without dropping    Baseline  Pt reports she is unable to grip items without dropping- goal revised as pt is being seen for telehealth and therefore functional measurements will be used.    Time  8    Period  Weeks    Status  Revised      OT LONG TERM GOAL #3   Title  Pt will increase RUE grip strength to 25 lbs or greater for increased functional use during ADLS.    Baseline  RUE 14 lbs    Time  8    Period  Weeks    Status  Deferred      OT LONG TERM GOAL #4   Title  Pt will resume use of LUE as her dominant hand at least 50% of the time or greater with painless than or equal to 5/10.    Baseline  uses LUE as an  assist-25% of the time, Pain 8/10    Time  8    Period   Weeks    Status  On-going      OT LONG TERM GOAL #5   Title  Pt will report increased ease with brushing teeth, opening continers and curling her hair with LUE.    Baseline  Pt requires increased assistance due to limited LUE functional use and pain. Pt reports she is not consistently using her LUE for these activities    Time  8    Period  Weeks    Status  On-going            Plan - 09/23/18 1324    Clinical Impression Statement  Pt is progressing slowly towards goals limited by pain. Pt has only been seen for 1/3 approved visits due to the temporary reduction in clinic hours at OP rehab due to COVID-19.Pt can benefit from continued skilled occupational therapy to maximize pt's safety and I with ADLS and IADLS and to increase pt's functional use of LUE. Pt was seen for a telehealth visit today.    Occupational performance deficits (Please refer to evaluation for details):  ADL's;IADL's;Rest and Sleep;Work;Play;Leisure;Social Participation    Body Structure / Function / Physical Skills  ADL;Edema;Flexibility;GMC;ROM;UE functional use;Strength;Pain;Mobility;FMC;Endurance;Balance;Coordination;Dexterity;IADL;Tone;Sensation    Rehab Potential  Good    OT Frequency  1x / week    OT Duration  8 weeks    OT Treatment/Interventions  Self-care/ADL training;Electrical Stimulation;Therapeutic exercise;Fluidtherapy;Energy conservation;Therapeutic activities;Patient/family education;Splinting;Passive range of motion;Manual Therapy;DME and/or AE instruction;Neuromuscular education;Contrast Bath;Paraffin;Ultrasound    Plan  continue to progress HEP via telehealth    Consulted and Agree with  Plan of Care  Patient       Patient will benefit from skilled therapeutic intervention in order to improve the following deficits and impairments:  Body Structure / Function / Physical Skills  Visit Diagnosis: Pain in left hand  Muscle weakness (generalized)  Stiffness of left hand, not elsewhere  classified  Other lack of coordination    Problem List Patient Active Problem List   Diagnosis Date Noted  . Chronic back pain   . Special screening for malignant neoplasms, colon 08/16/2017  . Glucose intolerance 02/13/2017  . Leg swelling 02/13/2017  . Chronic constipation 11/20/2016  . Hot flashes 11/20/2016  . Tear of MCL (medial collateral ligament) of knee, left, subsequent encounter 04/06/2016  . Anterolisthesis 04/06/2016  . Situational mixed anxiety and depressive disorder 02/26/2016  . Insomnia 02/24/2016  . Back pain with radiation 02/14/2016  . Essential hypertension, benign 12/26/2015  . DDD (degenerative disc disease), lumbar 12/26/2015  . Seasonal allergies 12/26/2015  . Obesity 04/06/2015  . Anemia   . Asthma   . Depression   . Hyperlipidemia     RINE,KATHRYN 09/23/2018, 1:45 PM  [facility] [address] [place], [place], [address] Phone: [phone]   Fax:  [phone]  Name: Melody Jenkins MRN: [id number] Date of Birth: March 01, 1966

## 2018-09-24 ENCOUNTER — Encounter (HOSPITAL_COMMUNITY): Payer: Self-pay | Admitting: Licensed Clinical Social Worker

## 2018-09-24 ENCOUNTER — Ambulatory Visit (INDEPENDENT_AMBULATORY_CARE_PROVIDER_SITE_OTHER): Payer: Medicaid Other | Admitting: Licensed Clinical Social Worker

## 2018-09-24 ENCOUNTER — Emergency Department (HOSPITAL_COMMUNITY)
Admission: EM | Admit: 2018-09-24 | Discharge: 2018-09-24 | Disposition: A | Discharge status: Home / Self Care | Payer: Medicaid Other | Attending: Emergency Medicine | Admitting: Emergency Medicine

## 2018-09-24 ENCOUNTER — Emergency Department (HOSPITAL_COMMUNITY): Payer: Medicaid Other

## 2018-09-24 ENCOUNTER — Other Ambulatory Visit: Payer: Self-pay

## 2018-09-24 ENCOUNTER — Encounter (HOSPITAL_COMMUNITY): Payer: Self-pay | Admitting: Family Medicine

## 2018-09-24 DIAGNOSIS — I10 Essential (primary) hypertension: Secondary | ICD-10-CM | POA: Diagnosis not present

## 2018-09-24 DIAGNOSIS — S161XXA Strain of muscle, fascia and tendon at neck level, initial encounter: Secondary | ICD-10-CM

## 2018-09-24 DIAGNOSIS — F332 Major depressive disorder, recurrent severe without psychotic features: Secondary | ICD-10-CM | POA: Diagnosis not present

## 2018-09-24 DIAGNOSIS — Y92410 Unspecified street and highway as the place of occurrence of the external cause: Secondary | ICD-10-CM | POA: Diagnosis not present

## 2018-09-24 DIAGNOSIS — Y999 Unspecified external cause status: Secondary | ICD-10-CM | POA: Diagnosis not present

## 2018-09-24 DIAGNOSIS — Z87891 Personal history of nicotine dependence: Secondary | ICD-10-CM | POA: Diagnosis not present

## 2018-09-24 DIAGNOSIS — F411 Generalized anxiety disorder: Secondary | ICD-10-CM

## 2018-09-24 DIAGNOSIS — Z79899 Other long term (current) drug therapy: Secondary | ICD-10-CM | POA: Diagnosis not present

## 2018-09-24 DIAGNOSIS — S199XXA Unspecified injury of neck, initial encounter: Secondary | ICD-10-CM | POA: Diagnosis present

## 2018-09-24 DIAGNOSIS — Y9389 Activity, other specified: Secondary | ICD-10-CM | POA: Insufficient documentation

## 2018-09-24 DIAGNOSIS — Z7984 Long term (current) use of oral hypoglycemic drugs: Secondary | ICD-10-CM | POA: Insufficient documentation

## 2018-09-24 MED ORDER — HYDROCODONE-ACETAMINOPHEN 5-325 MG PO TABS
1.0000 | ORAL_TABLET | Freq: Once | ORAL | Status: AC
  Administered 2018-09-24: 1 via ORAL
  Filled 2018-09-24: qty 1

## 2018-09-24 MED ORDER — METHOCARBAMOL 500 MG PO TABS: 500.0000 mg | ORAL_TABLET | Freq: Two times a day (BID) | ORAL | 0 refills | Status: DC

## 2018-09-24 NOTE — Progress Notes (Signed)
Virtual Visit via Video Note  I connected with Melody Jenkins on 09/24/18 at 11:00 AM EDT by a video enabled telemedicine application and verified that I am speaking with the correct person using two identifiers.   I discussed the limitations of evaluation and management by telemedicine and the availability of in person appointments. The patient expressed understanding and agreed to proceed.  History of Present Illness: Pt is referred to OP therapy by psychiatrist at [facility].    Observations/Objective: Objective; Describe signs and symptoms of depression currently experiencing.  Pt presents anxious via webex.   Assessment and Plan: Pt presents anxious via webex, completing a breathing exercise and assisted pt getting settled.  Pt described her psychiatric symptoms and current life events. She has been to several drs via webex, which increases her anxiety level. Pt tries to control all her docs and becomes frustrated and angry when she can't. Processed her feelings, her reasons behind trying to control, her communication styles including her voice. Role played effective communication styles. Updated tx plan adding anxiety goals.   Follow Up Instructions:    I discussed the assessment and treatment plan with the patient. The patient was provided an opportunity to ask questions and all were answered. The patient agreed with the plan and demonstrated an understanding of the instructions.   The patient was advised to call back or seek an in-person evaluation if the symptoms worsen or if the condition fails to improve as anticipated.  I provided 50 minutes of non-face-to-face time during this encounter.   Melody Jenkins S, LCAS
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
## 2018-09-24 NOTE — Discharge Instructions (Signed)
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
## 2018-09-29 ENCOUNTER — Ambulatory Visit: Payer: Medicaid Other | Admitting: Family Medicine

## 2018-09-30 ENCOUNTER — Ambulatory Visit: Payer: Medicaid Other | Admitting: Family Medicine

## 2018-09-30 ENCOUNTER — Ambulatory Visit: Payer: Medicaid Other | Admitting: Occupational Therapy

## 2018-09-30 DIAGNOSIS — M25641 Stiffness of right hand, not elsewhere classified: Secondary | ICD-10-CM

## 2018-09-30 DIAGNOSIS — R278 Other lack of coordination: Secondary | ICD-10-CM

## 2018-09-30 DIAGNOSIS — M6281 Muscle weakness (generalized): Secondary | ICD-10-CM

## 2018-09-30 DIAGNOSIS — M25642 Stiffness of left hand, not elsewhere classified: Secondary | ICD-10-CM

## 2018-09-30 DIAGNOSIS — M79642 Pain in left hand: Secondary | ICD-10-CM

## 2018-09-30 NOTE — Patient Instructions (Signed)
Access Code: [email or web address]  URL: [email or web address]  Date: 09/30/2018  Prepared by: Theone Murdoch   Exercises Seated Digit Tendon Gliding - 10 reps - 1 sets - 4-6x daily - 7x weekly Wrist Flexion Extension AROM - 10 reps - 1 sets - 4-6x daily - 7x weekly Seated Thumb Composite Flexion AROM - 10 reps - 1 sets - 4-6x daily - 7x weekly Thumb Opposition - 10 reps - 2 sets - 4-6x daily - 7x weekly Also-flipping dealing cards, picking up and manipulating coins, crumple a bag in your hand 20 mins per day Do not pick up hot or heavy items due to risk for injury.
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
## 2018-10-01 ENCOUNTER — Ambulatory Visit: Payer: Medicaid Other | Admitting: Family Medicine

## 2018-10-01 ENCOUNTER — Other Ambulatory Visit: Payer: Self-pay

## 2018-10-01 ENCOUNTER — Encounter: Payer: Self-pay | Admitting: Family Medicine

## 2018-10-01 ENCOUNTER — Encounter (HOSPITAL_COMMUNITY): Payer: Self-pay | Admitting: Licensed Clinical Social Worker

## 2018-10-01 ENCOUNTER — Ambulatory Visit (INDEPENDENT_AMBULATORY_CARE_PROVIDER_SITE_OTHER): Payer: Medicaid Other | Admitting: Licensed Clinical Social Worker

## 2018-10-01 VITALS — BP 116/74 | HR 78 | Temp 98.1°F | Resp 18 | Ht 68.0 in | Wt 251.6 lb

## 2018-10-01 DIAGNOSIS — S161XXA Strain of muscle, fascia and tendon at neck level, initial encounter: Secondary | ICD-10-CM | POA: Diagnosis not present

## 2018-10-01 DIAGNOSIS — F332 Major depressive disorder, recurrent severe without psychotic features: Secondary | ICD-10-CM | POA: Diagnosis not present

## 2018-10-01 DIAGNOSIS — F411 Generalized anxiety disorder: Secondary | ICD-10-CM | POA: Diagnosis not present

## 2018-10-01 MED ORDER — METHOCARBAMOL 500 MG PO TABS: 500.0000 mg | ORAL_TABLET | Freq: Three times a day (TID) | ORAL | 0 refills | Status: DC | PRN
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
## 2018-10-01 NOTE — Patient Instructions (Signed)
Stop the TENS right now  Do heat therapy with heating pad (see below)   Heat Therapy Heat therapy can help ease sore, stiff, injured, and tight muscles and joints. Heat relaxes your muscles, which may help ease your pain and muscle spasms. Do not use heat therapy unless your doctor tells you to use it. How to use heat therapy There are several different kinds of heat therapy, including:  Moist heat pack.  Hot water bottle.  Electric heating pad.  Heated gel pack.  Heated wrap.  Warm water bath. Your doctor will tell you how to use heat therapy. In general, you should: 1. Place a towel between your skin and the heat source. 2. Leave the heat on for 20-30 minutes. Your skin may turn pink. 3. Remove the heat if your skin turns bright red. You should remove the heat source if you are unable to feel pain, heat, or cold. You are more likely to get burned if you leave it on the skin for too long. Your doctor may also tell you to take a warm water bath. To do this: 1. Put a non-slip pad in the bathtub to prevent a fall. 2. Fill the bathtub with warm water. 3. Check the water temperature. 4. Soak in the water for 15-20 minutes, or as told by your doctor. 5. Be careful when you stand up after the bath. You may feel dizzy. 6. Pat yourself dry after the bath. Do not rub your skin to dry it. General recommendations for heat therapy  Do not sleep while using heat therapy. Only use heat therapy while you are awake.  Your skin may turn pink while using heat therapy. Do not use heat therapy if your skin turns red.  Do not use heat therapy if you have a new injury.  High heat or using heat for a long time can cause burns. Be careful not to burn your skin when using heat therapy.  Do not use heat therapy on areas of your skin that are already irritated, such as with a rash or sunburn. Get help if you have:  Blisters, redness, swelling (puffiness), or numbness.  New pain.  Pain that is  getting worse. Summary  Heat therapy is the use of heat to help ease sore, stiff, injured, and tight muscles and joints.  There are different types of heat therapy. Your doctor will tell you which one to use.  Only use heat therapy while you are awake.  Watch your skin to make sure you do not get burned while using heat therapy. This information is not intended to replace advice given to you by your health care provider. Make sure you discuss any questions you have with your health care provider. Document Released: 08/20/2011 Document Revised: 06/08/2017 Document Reviewed: 06/08/2017 Elsevier Interactive Patient Education  2019 [employer]    Cervical Sprain  A cervical sprain is a stretch or tear in the tissues that connect bones (ligaments) in the neck. Most neck (cervical) sprains get better in 4-6 weeks. Follow these instructions at home: If you have a neck collar:  Wear it as told by your doctor. Do not take off (do not remove) the collar unless your doctor says that this is safe.  Ask your doctor before adjusting your collar.  If you have long hair, keep it outside of the collar.  Ask your doctor if you may take off the collar for cleaning and bathing. If you may take off the collar: ? Follow instructions  from your doctor about how to take off the collar safely. ? Clean the collar by wiping it with mild soap and water. Let it air-dry all the way. ? If your collar has removable pads:  Take the pads out every 1-2 days.  Hand wash the pads with soap and water.  Let the pads air-dry all the way before you put them back in the collar. Do not dry them in a clothes dryer. Do not dry them with a hair dryer. ? Check your skin under the collar for irritation or sores. If you see any, tell your doctor. Managing pain, stiffness, and swelling   Use a cervical traction device, if told by your doctor.  If told, put heat on the affected area. Do this before exercises (physical  therapy) or as often as told by your doctor. Use the heat source that your doctor recommends, such as a moist heat pack or a heating pad. ? Place a towel between your skin and the heat source. ? Leave the heat on for 20-30 minutes. ? Take the heat off (remove the heat) if your skin turns bright red. This is very important if you cannot feel pain, heat, or cold. You may have a greater risk of getting burned.  Put ice on the affected area. ? Put ice in a plastic bag. ? Place a towel between your skin and the bag. ? Leave the ice on for 20 minutes, 2-3 times a day. Activity  Do not drive while wearing a neck collar. If you do not have a neck collar, ask your doctor if it is safe to drive.  Do not drive or use heavy machinery while taking prescription pain medicine or muscle relaxants, unless your doctor approves.  Do not lift anything that is heavier than 10 lb (4.5 kg) until your doctor tells you that it is safe.  Rest as told by your doctor.  Avoid activities that make you feel worse. Ask your doctor what activities are safe for you.  Do exercises as told by your doctor or physical therapist. Preventing neck sprain  Practice good posture. Adjust your workstation to help with this, if needed.  Exercise regularly as told by your doctor or physical therapist.  Avoid activities that are risky or may cause a neck sprain (cervical sprain). General instructions  Take over-the-counter and prescription medicines only as told by your doctor.  Do not use any products that contain nicotine or tobacco. This includes cigarettes and e-cigarettes. If you need help quitting, ask your doctor.  Keep all follow-up visits as told by your doctor. This is important. Contact a doctor if:  You have pain or other symptoms that get worse.  You have symptoms that do not get better after 2 weeks.  You have pain that does not get better with medicine.  You start to have new, unexplained symptoms.  You  have sores or irritated skin from wearing your neck collar. Get help right away if:  You have very bad pain.  You have any of the following in any part of your body: ? Loss of feeling (numbness). ? Tingling. ? Weakness.  You cannot move a part of your body (you have paralysis).  Your activity level does not improve. Summary  A cervical sprain is a stretch or tear in the tissues that connect bones (ligaments) in the neck.  If you have a neck (cervical) collar, do not take off the collar unless your doctor says that this is safe.  Put ice on affected areas as told by your doctor.  Put heat on affected areas as told by your doctor.  Good posture and regular exercise can help prevent a neck sprain from happening again. This information is not intended to replace advice given to you by your health care provider. Make sure you discuss any questions you have with your health care provider. Document Released: 11/14/2007 Document Revised: 02/07/2016 Document Reviewed: 02/07/2016 Elsevier Interactive Patient Education  2019 [employer].
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
## 2018-10-03 ENCOUNTER — Encounter: Payer: Self-pay | Admitting: Family Medicine

## 2018-10-07 ENCOUNTER — Ambulatory Visit: Payer: Medicaid Other | Admitting: Occupational Therapy

## 2018-10-07 DIAGNOSIS — M6281 Muscle weakness (generalized): Secondary | ICD-10-CM

## 2018-10-07 DIAGNOSIS — M25642 Stiffness of left hand, not elsewhere classified: Secondary | ICD-10-CM

## 2018-10-07 DIAGNOSIS — M79642 Pain in left hand: Secondary | ICD-10-CM | POA: Diagnosis not present

## 2018-10-07 DIAGNOSIS — R278 Other lack of coordination: Secondary | ICD-10-CM

## 2018-10-07 NOTE — Therapy (Addendum)
[facility] [address] [place], [place], [address] Phone: [phone]   Fax:  [phone]  Occupational Therapy Treatment  Patient Details  Name: Melody Jenkins MRN: [id number] Date of Birth: 01-Jun-1966 Referring Provider (OT): Dr Saintclair Halsted   Encounter Date: 10/07/2018  OT End of Session - 10/07/18 1649    Visit Number  4    Number of Visits  12    Date for OT Re-Evaluation  10/18/18    Authorization Type  Medicaid     Authorization Time Period  3 visits approved through 4/15, additional 8 OT visits approved through 11/24/18    OT Start Time  1205    OT Stop Time  1245    OT Time Calculation (min)  40 min    Activity Tolerance  Patient limited by pain;Patient tolerated treatment well    Behavior During Therapy  [facility] for tasks assessed/performed       Past Medical History:  Diagnosis Date  . Anemia   . Anxiety   . Asthma   . Carpal tunnel syndrome   . Chronic back pain   . Depression   . History of multiple miscarriages   . Hyperlipidemia     Past Surgical History:  Procedure Laterality Date  . BREAST SURGERY  2000   reduction  . CESAREAN SECTION    . COSMETIC SURGERY  2000   breast reduction  . REDUCTION MAMMAPLASTY Bilateral     There were no vitals filed for this visit.  Subjective Assessment - 10/07/18 1647    Subjective   Pt reports she sees MD on 10/09/18    Pertinent History  RUE CTR 2018, LUE CTR Jan 2020, anxiety, depression, PTSD, torn MCL in LLE and bulging disc in back, see pain management    Patient Stated Goals  to be able to use her dominant LUE and to better grip items with both hands.    Currently in Pain?  Yes    Pain Score  8     Pain Location  Hand    Pain Orientation  Left    Pain Descriptors / Indicators  Aching    Pain Type  Chronic pain    Pain Onset  More than a month ago    Pain Frequency  Constant    Aggravating Factors   movement    Pain Relieving Factors  rest, ice                Occupational Therapy Telehealth Visit:  I connected with Stephaniemarie Stoffel today at 12:05 (time) by [employer] and verified that I am speaking with the correct person using two identifiers.  I discussed the limitations, risks, security and privacy concerns of performing an evaluation and management service by Webex and the availability of in person appointments.  I also discussed with the patient that there may be a patient responsible charge related to this service. The patient expressed understanding and agreed to proceed.    The patient's address was confirmed.  Identified to the patient that therapist is a licensed OT in the state of  [place].  Verified phone # as [phone] to call in case of technical difficulties. Treatment: Pt reports she will see MD on 4/30 regarding pain, swelling and the raised bump in her palm. Reviewed tendon gliding HEP and A/ROM wrist flexion extension, and finger thumb opposition 10 reps each, min v./c and demonstration. Pt reports improved ROM and decreased swelling. Pt performed prayer position  x 5 for gentle wrist extension, min v.c Pt performed functional activities with her right hand: dealing cards, picking up and stacking coins, balling up and squeezing a bag in right hand with fingers and thumb, min v.c and demonstration. Pt performed gripping and wringing out a hand towel with bilateral UE's for increased strength and bilateral functional use. Wrist strengthening using a 1 lbs weight for 10 reps each flexion/ extension with palm up, as well as down, min v.c Pt was instructed to ask her MD if she should perform scar massage. Therapist has had pt hold off in case she has infection or a stitch left in place.                OT Short Term Goals - 09/23/18 1327      OT SHORT TERM GOAL #1   Title  I with initial HEP    Baseline  Pt demonstrates understanding    Time  4    Period  Weeks    Status  Achieved    Target  Date  09/18/18      OT SHORT TERM GOAL #2   Title  I with scar massage and edema control techniques.    Baseline  Pt needs continued reinforcement as hs has only been seen for eval plus 1 visit    Time  4    Period  Weeks    Status  On-going      OT SHORT TERM GOAL #3   Title  Pt will demonstrate at least 90% composite finger flexion for LUE in prep for functional use with pain no greater than 6/10    Baseline  70-75% composite finger flexion for LUE, pain 8/10    Time  4    Period  Weeks    Status  On-going      OT SHORT TERM GOAL #4   Title  Pt will demonstrate improved fine motor coordination as evidneced by perfroming 9 hole peg test in 45 secs or less for LUE.    Baseline  52.75 secs    Time  4    Period  Weeks    Status  Deferred   currently unable to assess due to telehealth visits       OT Long Term Goals - 09/23/18 1329      OT LONG TERM GOAL #1   Title  I with updated HEP.    Baseline  updated HEP not issued yet due to pt only being seen for 1 visit    Time  8    Period  Weeks    Status  On-going      OT LONG TERM GOAL #2   Title  Pt will report adequate LUE strength to pick up lightweight objects without dropping    Baseline  Pt reports she is unable to grip items without dropping- goal revised as pt is being seen for telehealth and therefore functional measurements will be used.    Time  8    Period  Weeks    Status  Revised      OT LONG TERM GOAL #3   Title  Pt will increase RUE grip strength to 25 lbs or greater for increased functional use during ADLS.    Baseline  RUE 14 lbs    Time  8    Period  Weeks    Status  Deferred      OT LONG TERM GOAL #4   Title  Pt will  resume use of LUE as her dominant hand at least 50% of the time or greater with painless than or equal to 5/10.    Baseline  uses LUE as an  assist-25% of the time, Pain 8/10    Time  8    Period  Weeks    Status  On-going      OT LONG TERM GOAL #5   Title  Pt will report increased  ease with brushing teeth, opening continers and curling her hair with LUE.    Baseline  Pt requires increased assistance due to limited LUE functional use and pain. Pt reports she is not consistently using her LUE for these activities    Time  8    Period  Weeks    Status  On-going            Plan - 10/07/18 1649    Clinical Impression Statement  Pt is progressing slowly towards goals limited by pain. Pt reports she has not followed up with her MD despite therapist recommendations. Pt reports significant continued pain and swelling.Pt states she is seeing MD on 10/09/18.Pt can benefit from continued skilled occupational therapy to maximize pt's safety and I with ADLS and IADLS and to increase pt's functional use of LUE. Pt was seen for a telehealth visit today.    OT Occupational Profile and History  Problem Focused Assessment - Including review of records relating to presenting problem    Occupational performance deficits (Please refer to evaluation for details):  ADL's;IADL's;Rest and Sleep;Work;Play;Leisure;Social Participation    Body Structure / Function / Physical Skills  ADL;Edema;Flexibility;GMC;ROM;UE functional use;Strength;Pain;Mobility;FMC;Endurance;Balance;Coordination;Dexterity;IADL;Tone;Sensation    Rehab Potential  Good    OT Frequency  1x / week    OT Duration  8 weeks    OT Treatment/Interventions  Self-care/ADL training;Electrical Stimulation;Therapeutic exercise;Fluidtherapy;Energy conservation;Therapeutic activities;Patient/family education;Splinting;Passive range of motion;Manual Therapy;DME and/or AE instruction;Neuromuscular education;Contrast Bath;Paraffin;Ultrasound    Plan  continue to progress HEP via telehealth, reinforce strengthening    Consulted and Agree with Plan of Care  Patient       Patient will benefit from skilled therapeutic intervention in order to improve the following deficits and impairments:  Body Structure / Function / Physical Skills  Visit  Diagnosis: Pain in left hand  Muscle weakness (generalized)  Stiffness of left hand, not elsewhere classified  Other lack of coordination    Problem List Patient Active Problem List   Diagnosis Date Noted  . Chronic back pain   . Special screening for malignant neoplasms, colon 08/16/2017  . Glucose intolerance 02/13/2017  . Leg swelling 02/13/2017  . Chronic constipation 11/20/2016  . Hot flashes 11/20/2016  . Tear of MCL (medial collateral ligament) of knee, left, subsequent encounter 04/06/2016  . Anterolisthesis 04/06/2016  . Situational mixed anxiety and depressive disorder 02/26/2016  . Insomnia 02/24/2016  . Back pain with radiation 02/14/2016  . Essential hypertension, benign 12/26/2015  . DDD (degenerative disc disease), lumbar 12/26/2015  . Seasonal allergies 12/26/2015  . Obesity 04/06/2015  . Anemia   . Asthma   . Depression   . Hyperlipidemia     Shannel Zahm 10/08/2018, 3:45 PM Theone Murdoch, OTR/L [phone] Phone: [phone] 3:45 PM 10/08/18 [facility] [address] [place], [place], [address] Phone: [phone]   Fax:  [phone]  Name: MATEJA DIER MRN: [id number] Date of Birth: [birth year]

## 2018-10-08 ENCOUNTER — Ambulatory Visit (INDEPENDENT_AMBULATORY_CARE_PROVIDER_SITE_OTHER): Payer: Medicaid Other | Admitting: Licensed Clinical Social Worker

## 2018-10-08 ENCOUNTER — Encounter (INDEPENDENT_AMBULATORY_CARE_PROVIDER_SITE_OTHER): Payer: Self-pay | Admitting: Orthopaedic Surgery

## 2018-10-08 ENCOUNTER — Encounter (HOSPITAL_COMMUNITY): Payer: Self-pay | Admitting: Licensed Clinical Social Worker

## 2018-10-08 ENCOUNTER — Other Ambulatory Visit: Payer: Self-pay

## 2018-10-08 DIAGNOSIS — F411 Generalized anxiety disorder: Secondary | ICD-10-CM | POA: Diagnosis not present

## 2018-10-08 DIAGNOSIS — F332 Major depressive disorder, recurrent severe without psychotic features: Secondary | ICD-10-CM | POA: Diagnosis not present

## 2018-10-08 NOTE — Progress Notes (Signed)
Virtual Visit via Video Note  I connected with Melody Jenkins on 10/08/18 at 11:00 AM EDT by a video enabled telemedicine application and verified that I am speaking with the correct person using two identifiers.   I discussed the limitations of evaluation and management by telemedicine and the availability of in person appointments. The patient expressed understanding and agreed to proceed.  History of Present Illness: Pt is referred to OP therapy by psychiatrist at [facility].    Observations/Objective: Objective; Describe signs and symptoms of depression currently experiencing.    Assessment and Plan: Pt presents anxious, depressed and in chronic pain via webex.  Pt described her psychiatric symptoms and current life events.  Pt described her chronic pain which leads to her depressive symptoms. Pt's anxiety has increased due to her upcoming disability hearing which is now scheduled via phone call. Discussed risks and benefits of a phone call disability hearing. Educated pt on the practice and purpose of mindfulness, including grounding and relaxation exercises. Emailed pt worksheets on grounding.   Follow Up Instructions:    I discussed the assessment and treatment plan with the patient. The patient was provided an opportunity to ask questions and all were answered. The patient agreed with the plan and demonstrated an understanding of the instructions.   The patient was advised to call back or seek an in-person evaluation if the symptoms worsen or if the condition fails to improve as anticipated.  I provided 60 minutes of non-face-to-face time during this encounter.   MACKENZIE,LISBETH S, LCAS

## 2018-10-08 NOTE — Patient Instructions (Signed)
Access Code: [email or web address]  URL: [email or web address]  Date: 10/08/2018  Prepared by: Curt Bears Sarinity Dicicco   Exercises Wrist Flexion with Resistance Bar - 10 reps - 2 sets - 1x daily - 7x weekly Wrist Extension with Dumbbell - 10 reps - 2 sets - 1x daily - 7x weekly

## 2018-10-09 ENCOUNTER — Telehealth (INDEPENDENT_AMBULATORY_CARE_PROVIDER_SITE_OTHER): Payer: Self-pay

## 2018-10-09 ENCOUNTER — Other Ambulatory Visit: Payer: Self-pay

## 2018-10-09 ENCOUNTER — Encounter (HOSPITAL_COMMUNITY): Payer: Self-pay | Admitting: Psychiatry

## 2018-10-09 ENCOUNTER — Ambulatory Visit (INDEPENDENT_AMBULATORY_CARE_PROVIDER_SITE_OTHER): Payer: Medicaid Other | Admitting: Psychiatry

## 2018-10-09 DIAGNOSIS — F333 Major depressive disorder, recurrent, severe with psychotic symptoms: Secondary | ICD-10-CM

## 2018-10-09 DIAGNOSIS — F411 Generalized anxiety disorder: Secondary | ICD-10-CM

## 2018-10-09 DIAGNOSIS — G4701 Insomnia due to medical condition: Secondary | ICD-10-CM

## 2018-10-09 DIAGNOSIS — F4310 Post-traumatic stress disorder, unspecified: Secondary | ICD-10-CM | POA: Diagnosis not present

## 2018-10-09 MED ORDER — CLONIDINE HCL 0.2 MG PO TABS: 0.2000 mg | ORAL_TABLET | Freq: Two times a day (BID) | ORAL | 2 refills | Status: DC

## 2018-10-09 MED ORDER — LITHIUM CARBONATE ER 300 MG PO TBCR: 600.0000 mg | EXTENDED_RELEASE_TABLET | Freq: Every day | ORAL | 2 refills | Status: DC

## 2018-10-09 MED ORDER — MIRTAZAPINE 45 MG PO TABS: 45.0000 mg | ORAL_TABLET | Freq: Every day | ORAL | 2 refills | Status: DC

## 2018-10-09 MED ORDER — HYDROXYZINE PAMOATE 50 MG PO CAPS: 50.0000 mg | ORAL_CAPSULE | Freq: Three times a day (TID) | ORAL | 2 refills | Status: DC | PRN

## 2018-10-09 NOTE — Telephone Encounter (Signed)
Dr. Ninfa Linden is wondering if we can "push" this MRI for her alittle quicker possibly?

## 2018-10-09 NOTE — Progress Notes (Signed)
Virtual Visit via Telephone Note  I connected with Melody Jenkins on 10/09/18 at 11:15 AM EDT by telephone and verified that I am speaking with the correct person using two identifiers.  I discussed the limitations, risks, security and privacy concerns of performing an evaluation and management service by telephone and the availability of in person appointments. I also discussed with the patient that there may be a patient responsible charge related to this service. The patient expressed understanding and agreed to proceed.   10/09/2018 3:09 PM Melody Jenkins  MRN:  [id number]  Chief Complaint:  Chief Complaint    Depression; Anxiety     HPI: Patient is overwhelmed.  She tells me that everything is happening at once.  Her daughter helps her get ready in the morning and then goes to work.  Patient is alone most of the day while daughter is at work.  PTSD-Francis has a disability hearing is coming up. Its on the phone which is making her feel very anxious.  She thinks he would be better in person so that they can see how much she is suffering.  This is her 4th appeal.  She really needs the disability to go through because of her poor finances.  She had to go to hospital recently and it caused intrusive memories of Melody Jenkins's death.  She has been missing Melody Jenkins very much.  She can't go to his grave to talk to him.  Melody Jenkins is feeling very lonely.  She wonders if she would be suffering less if he were still around.  She really does not have anyone to talk to.  She is trying to reach out to friends a little bit more. Melody Jenkins is very fearful of being infected with covid.  She has ongoing depression.  She denies SI/HI. Melody Jenkins can't do much on her own and requires a lot of help from her aide, daughter and mother.  Her mother recently broke her elbow.  Despite knowing that she should not be Melody Jenkins still feels angry with her mother for not being able to come over and help Melody Jenkins with her female and other  things. Melody Jenkins feels upset that there is no guarantee her pain will improve even with surgery.  She is feeling defeated.  She has talked with surgeon and can't decide what to do. She wants to be able to walk and move around on her own. She wants her mind to be clear. So much is going thru her brain all the time.  She is not sleeping well.  Even if she is able to fall asleep when she wakes up she is not able to go back to sleep.   Visit Diagnosis:    ICD-10-CM   1. 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 PTSD (post-traumatic stress disorder) F43.10 hydrOXYzine (VISTARIL) 50 MG capsule  3. Insomnia due to medical condition G47.01 mirtazapine (REMERON) 45 MG tablet  4. Anxiety state F41.1 cloNIDine (CATAPRES) 0.2 MG tablet       Past Psychiatric History:  Patient endorse history of depression 20 years ago when she was given Lexapro by her primary care physician. She remember a good response from the Lexapro. Patient denies any history of psychiatric inpatient treatment, psychosis, suicidal attempt, mania, hallucination, OCD symptoms.   Previous Psychotropic Medications:Lexapro, Effexor, Vistaril-ineffective; Pristiq- helpful but increased BP  Substance Abuse History in the last 12 months:  No.  Consequences of Substance Abuse: Patient endorse history of drinking alcohol on the weekends and get some time intoxicated but denies any history of seizures, tremors, blackouts or any withdrawal symptoms.    Past Medical History:  Past Medical History:  Diagnosis Date  . Anemia   . Anxiety   . Asthma   . Carpal tunnel syndrome   . Chronic back pain   . Depression   . History of multiple miscarriages   . Hyperlipidemia     Past Surgical History:  Procedure Laterality Date  . BREAST SURGERY  2000   reduction  . CESAREAN SECTION    . COSMETIC SURGERY   2000   breast reduction  . REDUCTION MAMMAPLASTY Bilateral     Family Psychiatric History:  Family History  Problem Relation Age of Onset  . Diabetes Mother   . Hypertension Mother   . Hypertension Father   . Heart disease Father   . Early death Brother   . Kidney disease Maternal Grandmother   . Heart disease Brother   . Colon cancer Neg Hx   . Rectal cancer Neg Hx     Social History:  Social History   Socioeconomic History  . Marital status: Single    Spouse name: Not on file  . Number of children: 1  . Years of education: Not on file  . Highest education level: Not on file  Occupational History  . Occupation: disablity  Social Needs  . Financial resource strain: Very hard  . Food insecurity:    Worry: Never true    Inability: Never true  . Transportation needs:    Medical: No    Non-medical: No  Tobacco Use  . Smoking status: Former Smoker    Packs/day: 0.25    Years: 30.00    Pack years: 7.50    Types: Cigarettes    Last attempt to quit: 03/29/2018    Years since quitting: 0.5  . Smokeless tobacco: Never Used  Substance and Sexual Activity  . Alcohol use: No  . Drug use: No  . Sexual activity: Not Currently    Partners: Male    Birth control/protection: Post-menopausal  Lifestyle  . Physical activity:    Days per week: 0 days    Minutes per session: 0 min  . Stress: Rather much  Relationships  . Social connections:    Talks on phone: More than three times a week    Gets together: Once a week    Attends religious service: Never    Active member of club or organization: No    Attends meetings of clubs or organizations: Never    Relationship status: Never married  Other Topics Concern  . Not on file  Social History Narrative  . Not on file    Allergies:  Allergies  Allergen Reactions  . Trazodone And Nefazodone Hives    Metabolic Disorder Labs: Lab Results  Component Value Date   HGBA1C 6.2 (H) 04/29/2018   MPG 131 04/29/2018   MPG  137 01/13/2018   No results found for: PROLACTIN Lab Results  Component Value Date   CHOL 236 (H) 04/29/2018   TRIG 154 (H) 04/29/2018   HDL 37 (L) 04/29/2018   CHOLHDL 6.4 (H) 04/29/2018   VLDL 29 03/05/2016   LDLCALC 169 (H) 04/29/2018   LDLCALC 111 (H) 01/13/2018   Lab Results  Component Value Date   TSH 1.13 01/13/2018   TSH 0.83 11/07/2016    Therapeutic Level Labs: No  results found for: LITHIUM No results found for: VALPROATE No components found for:  CBMZ  Current Medications: Current Outpatient Medications  Medication Sig Dispense Refill  . Biotin 5000 MCG CAPS Take 1 capsule by mouth.    . Blood Glucose Monitoring Suppl (BLOOD GLUCOSE SYSTEM PAK) KIT Please dispense based on patient and insurance preference. Use as directed to monitor FSBS 1x daily. Dx: R73.09. 1 each 0  . Cholecalciferol (VITAMIN D3) 25 MCG (1000 UT) CAPS Take by mouth 2 (two) times daily.    . clindamycin-benzoyl peroxide (BENZACLIN) gel APPLY TOPICALLY TO FACE TWICE A DAY 25 g 2  . cloNIDine (CATAPRES) 0.2 MG tablet Take 1 tablet (0.2 mg total) by mouth 2 (two) times daily. 60 tablet 2  . Cyanocobalamin (VITAMIN B 12 Jenkins) Take 1 tablet by mouth daily.     . cyclobenzaprine (FLEXERIL) 10 MG tablet Take 1 tablet (10 mg total) by mouth 3 (three) times daily as needed for muscle spasms. 30 tablet 0  . furosemide (LASIX) 20 MG tablet TAKE 1 TABLET BY MOUTH EVERY DAY 30 tablet 6  . gabapentin (NEURONTIN) 300 MG capsule Take 600 mg by mouth 3 (three) times daily.    . Glucose Blood (BLOOD GLUCOSE TEST STRIPS) STRP Please dispense based on patient and insurance preference. Use as directed to monitor FSBS 1x daily. Dx: R73.09. 100 each 1  . hydrOXYzine (VISTARIL) 50 MG capsule Take 1 capsule (50 mg total) by mouth 3 (three) times daily as needed. 90 capsule 2  . Lancets MISC Please dispense based on patient and insurance preference. Use as directed to monitor FSBS 1x daily. Dx: R73.09. 100 each 1  . lithium  carbonate (LITHOBID) 300 MG CR tablet Take 2 tablets (600 mg total) by mouth daily. 60 tablet 2  . meloxicam (MOBIC) 7.5 MG tablet Take 7.5 mg by mouth 2 (two) times daily.  5  . metFORMIN (GLUCOPHAGE) 500 MG tablet Take 1 tablet (500 mg total) by mouth daily with breakfast. 90 tablet 3  . methocarbamol (ROBAXIN) 500 MG tablet Take 1 tablet (500 mg total) by mouth every 8 (eight) hours as needed for muscle spasms. 60 tablet 0  . metoprolol succinate (TOPROL-XL) 100 MG 24 hr tablet Take 1 tablet (100 mg total) by mouth daily. Take with or immediately following a meal. 30 tablet 6  . mirtazapine (REMERON) 45 MG tablet Take 1 tablet (45 mg total) by mouth at bedtime. 30 tablet 2  . Multiple Vitamins-Minerals (AIRBORNE Jenkins) Take by mouth daily.    Marland Kitchen oxyCODONE-acetaminophen (PERCOCET/ROXICET) 5-325 MG tablet Take by mouth 3 (three) times daily.    . Prucalopride Succinate (MOTEGRITY) 2 MG TABS Take 2 mg by mouth daily. 30 tablet 0  . vitamin C (ASCORBIC ACID) 500 MG tablet Take 500 mg by mouth daily.    . vitamin E (VITAMIN E) 400 UNIT capsule Take 400 Units by mouth daily.     No current facility-administered medications for this visit.      Psychiatric Specialty Exam:   There were no vitals taken for this visit.There is no height or weight on file to calculate BMI.  General Appearance: unable to comment  Eye Contact:  Unable to comment  Speech:  Clear and Coherent, Normal Rate and Strained  Volume:  Normal  Mood:  Depressed  Affect:  Congruent  Thought Process:  Coherent and Descriptions of Associations: Circumstantial  Orientation:  Full (Time, Place, and Person)  Thought Content:  Rumination  Suicidal Thoughts:  No  Homicidal Thoughts:  No  Memory:  Immediate;   Fair  Judgement:  Fair  Insight:  Present  Psychomotor Activity:  Unable to comment  Concentration:  Concentration: Fair  Recall:  [employer] of Knowledge:  Fair  Language:  Fair  Akathisia:  Unable to comment  Handed:   Unable to comment  AIMS (if indicated):     Assets:  Communication Skills Desire for Improvement Housing Social Support  ADL's:  Unable to comment  Cognition:  WNL  Sleep:               Screenings: GAD-7     Office Visit from 02/06/2016 in [facility]  Total GAD-7 Score  21    PHQ2-9     Office Visit from 06/20/2018 in [facility] Office Visit from 04/29/2018 in [facility] Office Visit from 01/13/2018 in [facility] Office Visit from 09/13/2017 in [facility] Office Visit from 08/06/2017 in [facility]  PHQ-2 Total Score  _0 0  5  PHQ-9 Total Score  _1 0  18      I reviewed the information below on 10/09/2018 and have updated it Assessment and Plan: MDD with anxiety, recurrent, severe without psychotic features; PTSD; insomnia    Medication management with supportive therapy. Risks and benefits, side effects and alternative treatment options discussed with patient. Pt was given an opportunity to ask questions about medication, illness, and treatment. All current psychiatric medications have been reviewed and discussed with the patient and adjusted as clinically appropriate. The patient has been provided an accurate and updated list of the medications being now prescribed. Patient expressed understanding of how their medications were to be used.  Pt verbalized understanding and verbal consent obtained for treatment.  The risk of un-intended pregnancy is low based on the fact that pt reports she is postmenopausal. Pt is aware that these meds carry a teratogenic risk. Pt will discuss plan of action if she does or plans to become pregnant in the future.  Status of current problems: worsening depression and anxiety  Meds: Vistaril 50 mg p.o. 3 times daily as needed anxiety as related to depression and PTSD Remeron 45 mg p.o. nightly for MDD, PTSD and insomnia Clonidine  0.62m BID (prescribed by PCP) as needed anxiety- pt denies SE. She has been taking it 3-4x/day Increase Lithium ER 6068mpo qD due to ongoing depression symptoms Patient is on Bystolic and Neurontin as prescribed by PCP both have been off label indication for anxiety   Labs: None  Therapy: brief supportive therapy provided. Discussed psychosocial stressors in detail.  Patient understands that her mood is greatly related to her pain and physical limitations. Encouraged pt to develop daily routine and work on daily goal setting as a way to improve mood symptoms.   Consultations: Encouraged to follow up with therapist Encouraged to follow up with PCP as needed-she knows that her blood pressure is elevated whenever she is in pain and is working with her PCP  Pt denies SI and is at an acute low risk for suicide. Patient told to call clinic if any problems occur. Patient advised to go to ER if they should develop SI/HI, side effects, or if symptoms worsen. Has crisis numbers to call if needed. Pt verbalized understanding.  F/up in 4-6 weeks or sooner if needed    SaCharlcie CradleMD  10/09/2018, 3:09 PM

## 2018-10-09 NOTE — Telephone Encounter (Signed)
appt scheduled for May 13 at 12:40pm, pt is aware, pt was also notified to call the facility everyday to check for cancellations.

## 2018-10-14 ENCOUNTER — Ambulatory Visit: Payer: Medicaid Other | Admitting: Occupational Therapy

## 2018-10-15 ENCOUNTER — Ambulatory Visit (INDEPENDENT_AMBULATORY_CARE_PROVIDER_SITE_OTHER): Payer: Medicaid Other | Admitting: Licensed Clinical Social Worker

## 2018-10-15 ENCOUNTER — Encounter (HOSPITAL_COMMUNITY): Payer: Self-pay | Admitting: Licensed Clinical Social Worker

## 2018-10-15 ENCOUNTER — Other Ambulatory Visit: Payer: Self-pay

## 2018-10-15 DIAGNOSIS — F4310 Post-traumatic stress disorder, unspecified: Secondary | ICD-10-CM

## 2018-10-15 DIAGNOSIS — F333 Major depressive disorder, recurrent, severe with psychotic symptoms: Secondary | ICD-10-CM

## 2018-10-15 DIAGNOSIS — F411 Generalized anxiety disorder: Secondary | ICD-10-CM

## 2018-10-15 NOTE — Progress Notes (Signed)
Virtual Visit via Video Note  I connected with [name] on 10/15/18 at 11:00 AM EDT by a video enabled telemedicine application and verified that I am speaking with the correct person using two identifiers.   I discussed the limitations of evaluation and management by telemedicine and the availability of in person appointments. The patient expressed understanding and agreed to proceed.  History of Present Illness: Pt is referred to OP therapy by psychiatrist at [facility] OP.    Observations/Objective: Objective; Describe signs and symptoms of depression currently experiencing.    Assessment and Plan: Pt presents anxious, depressed and in chronic pain via webex.  Pt described her psychiatric symptoms and current life events.  Pt described her chronic pain which leads to her depressive symptoms. Pt's anxiety has increased due to her upcoming disability hearing which is now scheduled via phone call. Discussed her current coping skills.  Pt and her attorney role played the hearing. Pt asked me to role play Q & A and critiquing her responses. Suggested appropriate "brief" responses.   Follow Up Instructions:    I discussed the assessment and treatment plan with the patient. The patient was provided an opportunity to ask questions and all were answered. The patient agreed with the plan and demonstrated an understanding of the instructions.   The patient was advised to call back or seek an in-person evaluation if the symptoms worsen or if the condition fails to improve as anticipated.  I provided 60 minutes of non-face-to-face time during this encounter.   Shavaun Osterloh S, LCAS

## 2018-10-16 ENCOUNTER — Encounter: Payer: Self-pay | Admitting: *Deleted

## 2018-10-17 ENCOUNTER — Other Ambulatory Visit: Payer: Self-pay | Admitting: Neurosurgery

## 2018-10-20 ENCOUNTER — Other Ambulatory Visit: Payer: Self-pay

## 2018-10-20 ENCOUNTER — Encounter (HOSPITAL_COMMUNITY): Payer: Self-pay | Admitting: Licensed Clinical Social Worker

## 2018-10-20 ENCOUNTER — Ambulatory Visit (INDEPENDENT_AMBULATORY_CARE_PROVIDER_SITE_OTHER): Payer: Medicaid Other | Admitting: Licensed Clinical Social Worker

## 2018-10-20 DIAGNOSIS — F411 Generalized anxiety disorder: Secondary | ICD-10-CM | POA: Diagnosis not present

## 2018-10-20 DIAGNOSIS — F332 Major depressive disorder, recurrent severe without psychotic features: Secondary | ICD-10-CM

## 2018-10-20 NOTE — Progress Notes (Signed)
Virtual Visit via Video Note  I connected with [name] on 10/20/18 at 11:00 AM EDT by a video enabled telemedicine application and verified that I am speaking with the correct person using two identifiers.   I discussed the limitations of evaluation and management by telemedicine and the availability of in person appointments. The patient expressed understanding and agreed to proceed.  History of Present Illness: Pt is referred to OP therapy by psychiatrist at [facility] OP.    Observations/Objective: Objective; Describe signs and symptoms of depression currently experiencing.    Assessment and Plan: Pt presents anxious, depressed and in chronic pain via webex.  Pt described her psychiatric symptoms and current life events. Pt was obviously in pain during the session.  Completed breathing exercise with pt and coached her on the skill of distraction, focus. Coached pt on "letting go" of trying to control, attorney, pending hearings, mother, daughter. Educated pt on protective and risk factors, what she can control and what she can't. Reviewed informational sheet.     Follow Up Instructions:    I discussed the assessment and treatment plan with the patient. The patient was provided an opportunity to ask questions and all were answered. The patient agreed with the plan and demonstrated an understanding of the instructions.   The patient was advised to call back or seek an in-person evaluation if the symptoms worsen or if the condition fails to improve as anticipated.  I provided 60 minutes of non-face-to-face time during this encounter.   MACKENZIE,LISBETH S, LCAS

## 2018-10-21 ENCOUNTER — Encounter: Payer: Self-pay | Admitting: Orthopaedic Surgery

## 2018-10-21 ENCOUNTER — Other Ambulatory Visit: Payer: Self-pay | Admitting: Neurosurgery

## 2018-10-21 DIAGNOSIS — G5602 Carpal tunnel syndrome, left upper limb: Secondary | ICD-10-CM

## 2018-10-22 ENCOUNTER — Other Ambulatory Visit: Payer: Medicaid Other

## 2018-10-22 ENCOUNTER — Ambulatory Visit (HOSPITAL_COMMUNITY): Payer: Medicaid Other | Admitting: Licensed Clinical Social Worker

## 2018-10-23 ENCOUNTER — Other Ambulatory Visit: Payer: Self-pay | Admitting: Family Medicine

## 2018-10-23 DIAGNOSIS — S161XXA Strain of muscle, fascia and tendon at neck level, initial encounter: Secondary | ICD-10-CM

## 2018-10-24 NOTE — Telephone Encounter (Signed)
Requested Prescriptions   Pending Prescriptions Disp Refills  . methocarbamol (ROBAXIN) 500 MG tablet [Pharmacy Med Name: METHOCARBAMOL 500 MG TABLET] 60 tablet 0    Sig: TAKE 1 TABLET (500 MG TOTAL) BY MOUTH EVERY 8 (EIGHT) HOURS AS NEEDED FOR MUSCLE SPASMS.   Last OV 10/01/2018 Last filled 10/01/2018

## 2018-10-28 ENCOUNTER — Encounter: Payer: Self-pay | Admitting: Orthopaedic Surgery

## 2018-10-28 ENCOUNTER — Encounter: Payer: Self-pay | Admitting: Family Medicine

## 2018-10-28 ENCOUNTER — Encounter: Payer: Medicaid Other | Admitting: Family Medicine

## 2018-10-29 ENCOUNTER — Other Ambulatory Visit: Payer: Self-pay

## 2018-10-29 ENCOUNTER — Ambulatory Visit (INDEPENDENT_AMBULATORY_CARE_PROVIDER_SITE_OTHER): Payer: Medicaid Other | Admitting: Licensed Clinical Social Worker

## 2018-10-29 ENCOUNTER — Encounter (HOSPITAL_COMMUNITY): Payer: Self-pay | Admitting: Licensed Clinical Social Worker

## 2018-10-29 DIAGNOSIS — F332 Major depressive disorder, recurrent severe without psychotic features: Secondary | ICD-10-CM | POA: Diagnosis not present

## 2018-10-29 DIAGNOSIS — F411 Generalized anxiety disorder: Secondary | ICD-10-CM | POA: Diagnosis not present

## 2018-10-29 DIAGNOSIS — F4310 Post-traumatic stress disorder, unspecified: Secondary | ICD-10-CM

## 2018-10-29 NOTE — Progress Notes (Signed)
Virtual Visit via Video Note  I connected with [name] on 10/29/18 at 11:00 AM EDT by a video enabled telemedicine application and verified that I am speaking with the correct person using two identifiers.   I discussed the limitations of evaluation and management by telemedicine and the availability of in person appointments. The patient expressed understanding and agreed to proceed.  History of Present Illness: Pt is referred to OP therapy by psychiatrist at [facility] OP.    Observations/Objective: Objective; Describe signs and symptoms of depression currently experiencing.    Assessment and Plan: Pt presents anxious, depressed and in chronic pain via webex.  Pt described her psychiatric symptoms and current life events. Pt was obviously in pain during the session.  Again, had to complete a breathing exercise to move forward with the session. Pt has her disability hearing tomorrow, which will be by phone. Processed her emotions and validated them. Role played with pt scenarios which could happen during the hearing. Pt wrote down some coping skills she could use during the  disability hearing: distraction, breathing, visualization. Practiced the skills.       Follow Up Instructions:    I discussed the assessment and treatment plan with the patient. The patient was provided an opportunity to ask questions and all were answered. The patient agreed with the plan and demonstrated an understanding of the instructions.   The patient was advised to call back or seek an in-person evaluation if the symptoms worsen or if the condition fails to improve as anticipated.  I provided 60 minutes of non-face-to-face time during this encounter.   Shereda Graw S, LCAS

## 2018-10-30 ENCOUNTER — Encounter: Payer: Self-pay | Admitting: Orthopaedic Surgery

## 2018-10-30 NOTE — Telephone Encounter (Signed)
Submitted appeal thru pt's insurance

## 2018-11-04 ENCOUNTER — Encounter: Payer: Self-pay | Admitting: Orthopaedic Surgery

## 2018-11-04 ENCOUNTER — Other Ambulatory Visit: Payer: Self-pay

## 2018-11-04 ENCOUNTER — Ambulatory Visit
Admission: RE | Admit: 2018-11-04 | Discharge: 2018-11-04 | Disposition: A | Discharge status: Home / Self Care | Payer: Medicaid Other | Source: Ambulatory Visit | Attending: Neurosurgery | Admitting: Neurosurgery

## 2018-11-04 DIAGNOSIS — G5602 Carpal tunnel syndrome, left upper limb: Secondary | ICD-10-CM

## 2018-11-04 MED ORDER — GADOBENATE DIMEGLUMINE 529 MG/ML IV SOLN
20.0000 mL | Freq: Once | INTRAVENOUS | Status: AC | PRN
  Administered 2018-11-04: 12:00:00 20 mL via INTRAVENOUS

## 2018-11-05 ENCOUNTER — Encounter (HOSPITAL_COMMUNITY): Payer: Self-pay | Admitting: Licensed Clinical Social Worker

## 2018-11-05 ENCOUNTER — Ambulatory Visit (INDEPENDENT_AMBULATORY_CARE_PROVIDER_SITE_OTHER): Payer: Medicaid Other | Admitting: Licensed Clinical Social Worker

## 2018-11-05 DIAGNOSIS — F4310 Post-traumatic stress disorder, unspecified: Secondary | ICD-10-CM | POA: Diagnosis not present

## 2018-11-05 DIAGNOSIS — F411 Generalized anxiety disorder: Secondary | ICD-10-CM

## 2018-11-05 DIAGNOSIS — F332 Major depressive disorder, recurrent severe without psychotic features: Secondary | ICD-10-CM

## 2018-11-05 NOTE — Progress Notes (Signed)
Virtual Visit via Video Note  I connected with [name] on 11/05/18 at 11:00 AM EDT by a video enabled telemedicine application and verified that I am speaking with the correct person using two identifiers.   I discussed the limitations of evaluation and management by telemedicine and the availability of in person appointments. The patient expressed understanding and agreed to proceed.  History of Present Illness: Pt is referred to OP therapy by psychiatrist at [facility] OP.    Observations/Objective: Objective; Describe signs and symptoms of depression currently experiencing.    Assessment and Plan: Pt presents anxious, depressed and in chronic pain via webex.  Pt described her psychiatric symptoms and current life events. Pt was obviously in pain during the session.  Patient had her disability hearing. She described her participation, thoughts about the hearing. Patient described her chronic pain currently and it's effects on her life. Coached patient in coping skills for chronic pain and anxiety to participate in the session.  Patient became more focused and was able to proceed in the session.     Follow Up Instructions:    I discussed the assessment and treatment plan with the patient. The patient was provided an opportunity to ask questions and all were answered. The patient agreed with the plan and demonstrated an understanding of the instructions.   The patient was advised to call back or seek an in-person evaluation if the symptoms worsen or if the condition fails to improve as anticipated.  I provided 60 minutes of non-face-to-face time during this encounter.   MACKENZIE,LISBETH S, LCAS

## 2018-11-06 ENCOUNTER — Other Ambulatory Visit: Payer: Self-pay

## 2018-11-06 ENCOUNTER — Ambulatory Visit (INDEPENDENT_AMBULATORY_CARE_PROVIDER_SITE_OTHER): Payer: Medicaid Other | Admitting: Psychiatry

## 2018-11-06 ENCOUNTER — Encounter (HOSPITAL_COMMUNITY): Payer: Self-pay | Admitting: Psychiatry

## 2018-11-06 DIAGNOSIS — F333 Major depressive disorder, recurrent, severe with psychotic symptoms: Secondary | ICD-10-CM

## 2018-11-06 DIAGNOSIS — G4701 Insomnia due to medical condition: Secondary | ICD-10-CM | POA: Diagnosis not present

## 2018-11-06 DIAGNOSIS — F4310 Post-traumatic stress disorder, unspecified: Secondary | ICD-10-CM | POA: Diagnosis not present

## 2018-11-06 DIAGNOSIS — F411 Generalized anxiety disorder: Secondary | ICD-10-CM | POA: Diagnosis not present

## 2018-11-06 MED ORDER — HYDROXYZINE PAMOATE 50 MG PO CAPS: 50.0000 mg | ORAL_CAPSULE | Freq: Three times a day (TID) | ORAL | 2 refills | Status: DC | PRN

## 2018-11-06 MED ORDER — MIRTAZAPINE 45 MG PO TABS: 45.0000 mg | ORAL_TABLET | Freq: Every day | ORAL | 2 refills | Status: DC

## 2018-11-06 MED ORDER — CLONIDINE HCL 0.2 MG PO TABS: 0.2000 mg | ORAL_TABLET | Freq: Two times a day (BID) | ORAL | 2 refills | Status: DC

## 2018-11-06 MED ORDER — LITHIUM CARBONATE ER 300 MG PO TBCR: 600.0000 mg | EXTENDED_RELEASE_TABLET | Freq: Every day | ORAL | 2 refills | Status: DC
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
## 2018-11-11 ENCOUNTER — Encounter: Payer: Self-pay | Admitting: Orthopaedic Surgery

## 2018-11-11 DIAGNOSIS — M19032 Primary osteoarthritis, left wrist: Secondary | ICD-10-CM | POA: Insufficient documentation

## 2018-11-12 ENCOUNTER — Other Ambulatory Visit: Payer: Medicaid Other

## 2018-11-13 ENCOUNTER — Ambulatory Visit (INDEPENDENT_AMBULATORY_CARE_PROVIDER_SITE_OTHER): Payer: Medicaid Other | Admitting: Licensed Clinical Social Worker

## 2018-11-13 ENCOUNTER — Encounter (HOSPITAL_COMMUNITY): Payer: Self-pay | Admitting: Licensed Clinical Social Worker

## 2018-11-13 ENCOUNTER — Other Ambulatory Visit: Payer: Self-pay

## 2018-11-13 DIAGNOSIS — F411 Generalized anxiety disorder: Secondary | ICD-10-CM

## 2018-11-13 DIAGNOSIS — F4310 Post-traumatic stress disorder, unspecified: Secondary | ICD-10-CM | POA: Diagnosis not present

## 2018-11-13 DIAGNOSIS — F332 Major depressive disorder, recurrent severe without psychotic features: Secondary | ICD-10-CM | POA: Diagnosis not present
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
## 2018-11-14 ENCOUNTER — Encounter: Payer: Self-pay | Admitting: *Deleted

## 2018-11-17 ENCOUNTER — Other Ambulatory Visit: Payer: Medicaid Other

## 2018-11-17 ENCOUNTER — Other Ambulatory Visit: Payer: Self-pay

## 2018-11-17 DIAGNOSIS — I10 Essential (primary) hypertension: Secondary | ICD-10-CM

## 2018-11-17 DIAGNOSIS — E782 Mixed hyperlipidemia: Secondary | ICD-10-CM

## 2018-11-17 DIAGNOSIS — E7439 Other disorders of intestinal carbohydrate absorption: Secondary | ICD-10-CM

## 2018-11-17 DIAGNOSIS — E559 Vitamin D deficiency, unspecified: Secondary | ICD-10-CM

## 2018-11-18 LAB — CBC WITH DIFFERENTIAL/PLATELET
Absolute Monocytes: 621 cells/uL (ref 200–950)
Basophils Absolute: 48 cells/uL (ref 0–200)
Basophils Relative: 0.7 %
Eosinophils Absolute: 186 cells/uL (ref 15–500)
Eosinophils Relative: 2.7 %
HCT: 44.1 % (ref 35.0–45.0)
Hemoglobin: 14 g/dL (ref 11.7–15.5)
Lymphs Abs: 3381 cells/uL (ref 850–3900)
MCH: 26.9 pg — ABNORMAL LOW (ref 27.0–33.0)
MCHC: 31.7 g/dL — ABNORMAL LOW (ref 32.0–36.0)
MCV: 84.6 fL (ref 80.0–100.0)
MPV: 10.5 fL (ref 7.5–12.5)
Monocytes Relative: 9 %
Neutro Abs: 2663 cells/uL (ref 1500–7800)
Neutrophils Relative %: 38.6 %
Platelets: 251 10*3/uL (ref 140–400)
RBC: 5.21 10*6/uL — ABNORMAL HIGH (ref 3.80–5.10)
RDW: 14.7 % (ref 11.0–15.0)
Total Lymphocyte: 49 %
WBC: 6.9 10*3/uL (ref 3.8–10.8)

## 2018-11-18 LAB — COMPLETE METABOLIC PANEL WITH GFR
AG Ratio: 1.4 (calc) (ref 1.0–2.5)
ALT: 19 U/L (ref 6–29)
AST: 16 U/L (ref 10–35)
Albumin: 3.9 g/dL (ref 3.6–5.1)
Alkaline phosphatase (APISO): 70 U/L (ref 37–153)
BUN: 16 mg/dL (ref 7–25)
CO2: 27 mmol/L (ref 20–32)
Calcium: 9.6 mg/dL (ref 8.6–10.4)
Chloride: 106 mmol/L (ref 98–110)
Creat: 0.91 mg/dL (ref 0.50–1.05)
GFR, Est African American: 83 mL/min/{1.73_m2} (ref >=60)
GFR, Est Non African American: 72 mL/min/{1.73_m2} (ref >=60)
Globulin: 2.8 g/dL (calc) (ref 1.9–3.7)
Glucose, Bld: 175 mg/dL — ABNORMAL HIGH (ref 65–99)
Potassium: 4.7 mmol/L (ref 3.5–5.3)
Sodium: 141 mmol/L (ref 135–146)
Total Bilirubin: 0.6 mg/dL (ref 0.2–1.2)
Total Protein: 6.7 g/dL (ref 6.1–8.1)

## 2018-11-18 LAB — LIPID PANEL
Cholesterol: 212 mg/dL — ABNORMAL HIGH (ref <200)
HDL: 34 mg/dL — ABNORMAL LOW (ref >=50)
LDL Cholesterol (Calc): 143 mg/dL (calc) — ABNORMAL HIGH
Non-HDL Cholesterol (Calc): 178 mg/dL (calc) — ABNORMAL HIGH (ref <130)
Total CHOL/HDL Ratio: 6.2 (calc) — ABNORMAL HIGH (ref <5.0)
Triglycerides: 211 mg/dL — ABNORMAL HIGH (ref <150)

## 2018-11-18 LAB — VITAMIN D 25 HYDROXY (VIT D DEFICIENCY, FRACTURES): Vit D, 25-Hydroxy: 20 ng/mL — ABNORMAL LOW (ref 30–100)

## 2018-11-18 LAB — HEMOGLOBIN A1C
Hgb A1c MFr Bld: 7.7 % of total Hgb — ABNORMAL HIGH (ref <5.7)
Mean Plasma Glucose: 174 (calc)
eAG (mmol/L): 9.7 (calc)

## 2018-11-19 ENCOUNTER — Encounter: Payer: Self-pay | Admitting: Family Medicine

## 2018-11-19 ENCOUNTER — Other Ambulatory Visit: Payer: Self-pay | Admitting: *Deleted

## 2018-11-19 ENCOUNTER — Encounter (HOSPITAL_COMMUNITY): Payer: Self-pay | Admitting: Licensed Clinical Social Worker

## 2018-11-19 ENCOUNTER — Ambulatory Visit (INDEPENDENT_AMBULATORY_CARE_PROVIDER_SITE_OTHER): Payer: Medicare Other | Admitting: Licensed Clinical Social Worker

## 2018-11-19 ENCOUNTER — Ambulatory Visit (INDEPENDENT_AMBULATORY_CARE_PROVIDER_SITE_OTHER): Payer: Medicaid Other | Admitting: Family Medicine

## 2018-11-19 ENCOUNTER — Other Ambulatory Visit: Payer: Self-pay

## 2018-11-19 VITALS — BP 128/78 | HR 88 | Temp 98.1°F | Resp 18 | Ht 68.0 in | Wt 254.0 lb

## 2018-11-19 DIAGNOSIS — Z6838 Body mass index (BMI) 38.0-38.9, adult: Secondary | ICD-10-CM

## 2018-11-19 DIAGNOSIS — Z0001 Encounter for general adult medical examination with abnormal findings: Secondary | ICD-10-CM | POA: Diagnosis not present

## 2018-11-19 DIAGNOSIS — I10 Essential (primary) hypertension: Secondary | ICD-10-CM

## 2018-11-19 DIAGNOSIS — E119 Type 2 diabetes mellitus without complications: Secondary | ICD-10-CM

## 2018-11-19 DIAGNOSIS — F333 Major depressive disorder, recurrent, severe with psychotic symptoms: Secondary | ICD-10-CM

## 2018-11-19 DIAGNOSIS — E782 Mixed hyperlipidemia: Secondary | ICD-10-CM | POA: Diagnosis not present

## 2018-11-19 DIAGNOSIS — E559 Vitamin D deficiency, unspecified: Secondary | ICD-10-CM

## 2018-11-19 DIAGNOSIS — F411 Generalized anxiety disorder: Secondary | ICD-10-CM

## 2018-11-19 DIAGNOSIS — Z0000 Encounter for general adult medical examination without abnormal findings: Secondary | ICD-10-CM

## 2018-11-19 MED ORDER — METFORMIN HCL 500 MG PO TABS: 500.0000 mg | ORAL_TABLET | Freq: Two times a day (BID) | ORAL | 3 refills | Status: DC

## 2018-11-19 MED ORDER — VITAMIN D (ERGOCALCIFEROL) 1.25 MG (50000 UNIT) PO CAPS: 50000.0000 [IU] | ORAL_CAPSULE | ORAL | 1 refills | Status: DC

## 2018-11-19 MED ORDER — TETANUS-DIPHTH-ACELL PERTUSSIS 5-2.5-18.5 LF-MCG/0.5 IM SUSP: 0.5000 mL | Freq: Once | INTRAMUSCULAR | 0 refills | Status: AC

## 2018-11-19 NOTE — Assessment & Plan Note (Signed)
Discussed how diet and weight also affects her chronic joint pain

## 2018-11-19 NOTE — Assessment & Plan Note (Signed)
Improved, goal LDL less than 100, pt overwhelemed with orthopedic issues, now blood sugar, vitamin D Will not add another med at this time Discussed diet

## 2018-11-19 NOTE — Assessment & Plan Note (Addendum)
Uncontrolled, increase metformin to  500mg  twice a day  Check CBG once a day fasting

## 2018-11-19 NOTE — Patient Instructions (Addendum)
Increase metformin to twice a day  Take the prescription vitamin D once a week for 6 months  Continue your blood pressure medication Plan for eye doctor in the winter  Check your blood sugar fasting TDAP sent to pharmacy F/U 3 months

## 2018-11-19 NOTE — Assessment & Plan Note (Signed)
Given 50,000IU weekly x 6 months
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
## 2018-11-19 NOTE — Progress Notes (Signed)
Virtual Visit via Video Note  I connected with [name] on 11/19/18 at 10:00 AM EDT by a video enabled telemedicine application and verified that I am speaking with the correct person using two identifiers.   I discussed the limitations of evaluation and management by telemedicine and the availability of in person appointments. The patient expressed understanding and agreed to proceed.  History of Present Illness: Pt is referred to OP therapy by psychiatrist at [facility].    Observations/Objective: Objective; Describe signs and symptoms of depression currently experiencing.    Assessment and Plan: Pt presents anxious, depressed and in chronic pain via webex.  Pt described her psychiatric symptoms and current life events. Pt presented agitated, frustrated, anxious, depressed. Pt described her symptoms. Pt continues with MRIs, doctor appts, injections, results, tx plans (knee, hand, back). "I'm falling apart." Asked open ended questions. Used CBT techniques with pt, changing her attitudes and  behavior by focusing on the thoughts, images, beliefs and attitudes that are held (cognitive processes) and how these processes relate to the way she behaves, as a way of dealing with her emotional problems.   Follow Up Instructions:   I discussed the assessment and treatment plan with the patient. The patient was provided an opportunity to ask questions and all were answered. The patient agreed with the plan and demonstrated an understanding of the instructions.   The patient was advised to call back or seek an in-person evaluation if the symptoms worsen or if the condition fails to improve as anticipated.  I provided 60 minutes of non-face-to-face time during this encounter.   MACKENZIE,LISBETH S, LCAS

## 2018-11-19 NOTE — Assessment & Plan Note (Signed)
Controlled no changes 

## 2018-11-20 DIAGNOSIS — M1811 Unilateral primary osteoarthritis of first carpometacarpal joint, right hand: Secondary | ICD-10-CM | POA: Diagnosis not present

## 2018-11-20 DIAGNOSIS — M65321 Trigger finger, right index finger: Secondary | ICD-10-CM | POA: Diagnosis not present

## 2018-11-20 DIAGNOSIS — M19039 Primary osteoarthritis, unspecified wrist: Secondary | ICD-10-CM | POA: Diagnosis not present

## 2018-11-20 DIAGNOSIS — M1812 Unilateral primary osteoarthritis of first carpometacarpal joint, left hand: Secondary | ICD-10-CM | POA: Insufficient documentation

## 2018-11-25 ENCOUNTER — Other Ambulatory Visit: Payer: Self-pay

## 2018-11-25 ENCOUNTER — Ambulatory Visit (INDEPENDENT_AMBULATORY_CARE_PROVIDER_SITE_OTHER): Payer: Medicare Other | Admitting: Licensed Clinical Social Worker

## 2018-11-25 ENCOUNTER — Encounter (HOSPITAL_COMMUNITY): Payer: Self-pay | Admitting: Licensed Clinical Social Worker

## 2018-11-25 DIAGNOSIS — F4310 Post-traumatic stress disorder, unspecified: Secondary | ICD-10-CM

## 2018-11-25 DIAGNOSIS — F331 Major depressive disorder, recurrent, moderate: Secondary | ICD-10-CM

## 2018-11-25 DIAGNOSIS — F411 Generalized anxiety disorder: Secondary | ICD-10-CM
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
## 2018-11-26 ENCOUNTER — Other Ambulatory Visit: Payer: Self-pay

## 2018-11-26 ENCOUNTER — Ambulatory Visit
Admission: RE | Admit: 2018-11-26 | Discharge: 2018-11-26 | Disposition: A | Discharge status: Home / Self Care | Payer: Medicaid Other | Source: Ambulatory Visit | Attending: Orthopaedic Surgery | Admitting: Orthopaedic Surgery

## 2018-11-26 ENCOUNTER — Other Ambulatory Visit: Payer: Self-pay | Admitting: Family Medicine

## 2018-11-26 DIAGNOSIS — M25562 Pain in left knee: Secondary | ICD-10-CM

## 2018-11-26 DIAGNOSIS — S161XXA Strain of muscle, fascia and tendon at neck level, initial encounter: Secondary | ICD-10-CM

## 2018-11-26 DIAGNOSIS — G8929 Other chronic pain: Secondary | ICD-10-CM

## 2018-11-26 NOTE — Telephone Encounter (Signed)
Ok to refill 

## 2018-12-02 ENCOUNTER — Encounter (HOSPITAL_COMMUNITY): Payer: Self-pay | Admitting: Licensed Clinical Social Worker

## 2018-12-02 ENCOUNTER — Ambulatory Visit (INDEPENDENT_AMBULATORY_CARE_PROVIDER_SITE_OTHER): Payer: Medicaid Other | Admitting: Licensed Clinical Social Worker

## 2018-12-02 ENCOUNTER — Other Ambulatory Visit: Payer: Self-pay

## 2018-12-02 DIAGNOSIS — F333 Major depressive disorder, recurrent, severe with psychotic symptoms: Secondary | ICD-10-CM

## 2018-12-02 DIAGNOSIS — F411 Generalized anxiety disorder: Secondary | ICD-10-CM

## 2018-12-02 NOTE — Progress Notes (Signed)
Virtual Visit via Video Note  I connected with Melody Jenkins on 12/02/18 at 10:00 AM EDT by a video enabled telemedicine application and verified that I am speaking with the correct person using two identifiers.   I discussed the limitations of evaluation and management by telemedicine and the availability of in person appointments. The patient expressed understanding and agreed to proceed.  History of Present Illness: Pt is referred to OP therapy by psychiatrist at [facility] OP.    Observations/Objective: Objective; I want to feel less chronic pain.    Assessment and Plan: Pt presents anxious,  and in chronic pain via webex.  Pt described her psychiatric symptoms and current life events. Pt presented agitated, frustrated, anxious, angry.  Patient had an MRI on her knee and does not feel heard by her dr. Ivor Messier pt on how to talk to her dr, how to ask dr questions, what questions to ask, use breathing exercises before and during the appointment. Patient discussed her new shoulder pain - rotator cuff. Asked open ended questions and discussed options and next steps for her rotator cuff. Discussed the importance of maintaining her peace, not getting herself worked up and angry, frustrated. Role played asking for what she needs from the doctor, her tone of voice to use.       Follow Up Instructions:   I discussed the assessment and treatment plan with the patient. The patient was provided an opportunity to ask questions and all were answered. The patient agreed with the plan and demonstrated an understanding of the instructions.   The patient was advised to call back or seek an in-person evaluation if the symptoms worsen or if the condition fails to improve as anticipated.  I provided 60 minutes of non-face-to-face time during this encounter.   Tigran Haynie S, LCAS

## 2018-12-03 ENCOUNTER — Ambulatory Visit (INDEPENDENT_AMBULATORY_CARE_PROVIDER_SITE_OTHER): Payer: Medicaid Other | Admitting: Orthopaedic Surgery

## 2018-12-03 ENCOUNTER — Telehealth: Payer: Self-pay

## 2018-12-03 ENCOUNTER — Encounter: Payer: Self-pay | Admitting: Orthopaedic Surgery

## 2018-12-03 ENCOUNTER — Other Ambulatory Visit: Payer: Self-pay

## 2018-12-03 DIAGNOSIS — M25511 Pain in right shoulder: Secondary | ICD-10-CM

## 2018-12-03 DIAGNOSIS — G8929 Other chronic pain: Secondary | ICD-10-CM | POA: Diagnosis not present

## 2018-12-03 DIAGNOSIS — M25562 Pain in left knee: Secondary | ICD-10-CM

## 2018-12-03 MED ORDER — LIDOCAINE HCL 1 % IJ SOLN
3.0000 mL | INTRAMUSCULAR | Status: AC | PRN
  Administered 2018-12-03: 3 mL

## 2018-12-03 MED ORDER — METHYLPREDNISOLONE ACETATE 40 MG/ML IJ SUSP
40.0000 mg | INTRAMUSCULAR | Status: AC | PRN
  Administered 2018-12-03: 40 mg via INTRA_ARTICULAR

## 2018-12-03 NOTE — Telephone Encounter (Signed)
Left knee gel injection ?

## 2018-12-03 NOTE — Progress Notes (Signed)
Office Visit Note   Patient: Melody Jenkins           Date of Birth: [birth year]           MRN: [id number] Visit Date: 12/03/2018              Requested by: Alycia Rossetti, MD [address] [place],  [place] [address] PCP: Alycia Rossetti, MD   Assessment & Plan: Visit Diagnoses:  1. Chronic pain of left knee   2. Acute pain of right shoulder     Plan: Fortunately there is not surgery that I recommend for her left knee but it is unfortunate she is still has significant knee pain.  I recommended Voltaren gel as well as weight loss and a hinged knee brace to see if this will help.  Hopefully we can get hyaluronic acid approved for her for her left knee.  I did provide a steroid injection in her right shoulder.  We will see her back in 4 weeks to hopefully provide hyaluronic acid injection in her left knee.  I continue to recommend weight loss as well.  Follow-Up Instructions: Return in about 4 weeks (around 12/31/2018).   Orders:  Orders Placed This Encounter  Procedures  . Large Joint Inj   No orders of the defined types were placed in this encounter.     Procedures: Large Joint Inj: R subacromial bursa on 12/03/2018 10:53 AM Indications: pain and diagnostic evaluation Details: 22 G 1.5 in needle  Arthrogram: No  Medications: 3 mL lidocaine 1 %; 40 mg methylPREDNISolone acetate 40 MG/ML Outcome: tolerated well, no immediate complications Procedure, treatment alternatives, risks and benefits explained, specific risks discussed. Consent was given by the patient. Immediately prior to procedure a time out was called to verify the correct patient, procedure, equipment, support staff and site/side marked as required. Patient was prepped and draped in the usual sterile fashion.       Clinical Data: No additional findings.   Subjective: Chief Complaint  Patient presents with  . Left Knee - Follow-up  The patient comes in for continued evaluation treatment of left  knee pain.  She is following up after MRI of that knee.  She saw 1 of my partners Dr. Marlou Sa several years ago and had a incomplete tear of her MCL.  She still reports severe medial knee pain and she feels like it is in the soft tissues.  She is also now developed right shoulder stiffness and pain.  [place] with a cane.  She is 254 pounds.  She is in chronic daily pain due to all of these things.  HPI  Review of Systems She currently denies any fever, chills, nausea, vomiting.  She denies any headache or shortness of breath.  Objective: Vital Signs: There were no vitals taken for this visit.  Physical Exam She is alert and orient x3 and in no acute distress Ortho Exam Examination of her right shoulder shows is clinically well located but very painful with any attempts of motion of the shoulder.  A lot of her pain is at a portion of exam it seems.  Examination of her left knee shows no effusion.  She has pain along the course of the medial collateral ligament and mainly at the medial tibial plateau.  The knee feels ligamentously stable. Specialty Comments:  No specialty comments available.  Imaging: No results found. The MRI is reviewed with her and shows no tear of the MCL.  This is healed completely from comparison to the previous MRI of her knee.  There is a small incomplete radial tear of the posterior root of the medial meniscus and a small Baker's cyst but these are of no significant clinical consequence.  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  PMFS History: Patient Active Problem List   Diagnosis Date Noted  . Vitamin D deficiency 11/19/2018  . Chronic back pain   . Special screening for malignant neoplasms, colon 08/16/2017  . Diabetes mellitus without complication ([place]) 00/92/3300  . Leg swelling 02/13/2017  . Chronic constipation 11/20/2016  . Hot flashes 11/20/2016  . Tear of MCL (medial collateral  ligament) of knee, left, subsequent encounter 04/06/2016  . Anterolisthesis 04/06/2016  . Situational mixed anxiety and depressive disorder 02/26/2016  . Insomnia 02/24/2016  . Back pain with radiation 02/14/2016  . Essential hypertension, benign 12/26/2015  . DDD (degenerative disc disease), lumbar 12/26/2015  . Seasonal allergies 12/26/2015  . Obesity 04/06/2015  . Anemia   . Asthma   . Depression   . Hyperlipidemia    Past Medical History:  Diagnosis Date  . Anemia   . Anxiety   . Asthma   . Carpal tunnel syndrome   . Chronic back pain   . Depression   . History of multiple miscarriages   . Hyperlipidemia     Family History  Problem Relation Age of Onset  . Diabetes Mother   . Hypertension Mother   . Hypertension Father   . Heart disease Father   . Early death Brother   . Kidney disease Maternal Grandmother   . Heart disease Brother   . Colon cancer Neg Hx   . Rectal cancer Neg Hx     Past Surgical History:  Procedure Laterality Date  . BREAST SURGERY  2000   reduction  . CESAREAN SECTION    . COSMETIC SURGERY  2000   breast reduction  . REDUCTION MAMMAPLASTY Bilateral    Social History   Occupational History  . Occupation: disablity  Tobacco Use  . Smoking status: Former Smoker    Packs/day: 0.25    Years: 30.00    Pack years: 7.50    Types: Cigarettes    Quit date: 03/29/2018    Years since quitting: 0.6  . Smokeless tobacco: Never Used  Substance and Sexual Activity  . Alcohol use: No  . Drug use: No  . Sexual activity: Not Currently    Partners: Male    Birth control/protection: Post-menopausal

## 2018-12-05 NOTE — Telephone Encounter (Signed)
Printed demographics for submission.  

## 2018-12-09 ENCOUNTER — Encounter (HOSPITAL_COMMUNITY): Payer: Self-pay | Admitting: Licensed Clinical Social Worker

## 2018-12-09 ENCOUNTER — Ambulatory Visit (INDEPENDENT_AMBULATORY_CARE_PROVIDER_SITE_OTHER): Payer: Medicaid Other | Admitting: Licensed Clinical Social Worker

## 2018-12-09 ENCOUNTER — Other Ambulatory Visit: Payer: Self-pay

## 2018-12-09 DIAGNOSIS — F4310 Post-traumatic stress disorder, unspecified: Secondary | ICD-10-CM

## 2018-12-09 DIAGNOSIS — F411 Generalized anxiety disorder: Secondary | ICD-10-CM

## 2018-12-09 DIAGNOSIS — F332 Major depressive disorder, recurrent severe without psychotic features: Secondary | ICD-10-CM | POA: Diagnosis not present

## 2018-12-09 NOTE — Progress Notes (Signed)
Virtual Visit via Video Note  I connected with Melody Jenkins on 12/09/18 at 10:00 AM EDT by a video enabled telemedicine application and verified that I am speaking with the correct person using two identifiers.   I discussed the limitations of evaluation and management by telemedicine and the availability of in person appointments. The patient expressed understanding and agreed to proceed.  History of Present Illness: Pt is referred to OP therapy by psychiatrist at [facility].    Observations/Objective: Objective; I want to feel less chronic pain, less anxiety, and depression.  Assessment and Plan: Pt presents anxious,  and in chronic pain via webex.  Pt described her psychiatric symptoms and current life events. Pt presented agitated, frustrated, anxious, angry.  She still continues to fight for her LTD through her previous job. Patient was agitated, so assisted patient with a calming exercise. Coached patient on how her agitation and anger manifests into the exacerbation of her pain. Used cbt to   assist her unhelpful patterns of thinking, and behaviors as a result of her thoughts.     Follow Up Instructions:   I discussed the assessment and treatment plan with the patient. The patient was provided an opportunity to ask questions and all were answered. The patient agreed with the plan and demonstrated an understanding of the instructions.   The patient was advised to call back or seek an in-person evaluation if the symptoms worsen or if the condition fails to improve as anticipated.  I provided 60 minutes of non-face-to-face time during this encounter.   Amontae Ng S, LCAS

## 2018-12-11 DIAGNOSIS — M65321 Trigger finger, right index finger: Secondary | ICD-10-CM | POA: Diagnosis not present

## 2018-12-11 DIAGNOSIS — M1812 Unilateral primary osteoarthritis of first carpometacarpal joint, left hand: Secondary | ICD-10-CM | POA: Diagnosis not present

## 2018-12-16 ENCOUNTER — Other Ambulatory Visit: Payer: Self-pay

## 2018-12-16 ENCOUNTER — Encounter (HOSPITAL_COMMUNITY): Payer: Self-pay | Admitting: Licensed Clinical Social Worker

## 2018-12-16 ENCOUNTER — Ambulatory Visit (INDEPENDENT_AMBULATORY_CARE_PROVIDER_SITE_OTHER): Payer: Medicare Other | Admitting: Licensed Clinical Social Worker

## 2018-12-16 DIAGNOSIS — F411 Generalized anxiety disorder: Secondary | ICD-10-CM

## 2018-12-16 DIAGNOSIS — F333 Major depressive disorder, recurrent, severe with psychotic symptoms: Secondary | ICD-10-CM

## 2018-12-16 NOTE — Progress Notes (Signed)
Virtual Visit via Video Note  I connected with Melody Jenkins on 12/16/18 at  3:00 PM EDT by a video enabled telemedicine application and verified that I am speaking with the correct person using two identifiers.   I discussed the limitations of evaluation and management by telemedicine and the availability of in person appointments. The patient expressed understanding and agreed to proceed.  History of Present Illness: Pt is referred to OP therapy by psychiatrist at [facility] OP.    Observations/Objective: Objective; I want to feel less chronic pain, less anxiety, and depression.  Assessment and Plan: Pt presents anxious,  depressed and in chronic pain via webex.  Pt described her psychiatric symptoms and current life events. Pt presented agitated, frustrated, anxious, angry.  She still continues to fight for her LTD through her previous job.  Pt reports she has been fighting with [employer] (L[employer]) to the point "I was so depressed and anxious yesterday I had to go to bed.' Asked open ended questions discussing what other coping tools she used. identify particular negative thought patterns as well as your behavioral responses to stressful and challenging situations.  Follow Up Instructions:   I discussed the assessment and treatment plan with the patient. The patient was provided an opportunity to ask questions and all were answered. The patient agreed with the plan and demonstrated an understanding of the instructions.   The patient was advised to call back or seek an in-person evaluation if the symptoms worsen or if the condition fails to improve as anticipated.  I provided 60 minutes of non-face-to-face time during this encounter.   Trenita Hulme S, LCAS

## 2018-12-17 ENCOUNTER — Telehealth: Payer: Self-pay

## 2018-12-17 NOTE — Telephone Encounter (Signed)
Mailed [employer] application for patient to complete and return to the office for left knee gel injection.

## 2018-12-18 ENCOUNTER — Ambulatory Visit (INDEPENDENT_AMBULATORY_CARE_PROVIDER_SITE_OTHER): Payer: Medicare Other | Admitting: Psychiatry

## 2018-12-18 ENCOUNTER — Other Ambulatory Visit: Payer: Self-pay

## 2018-12-18 ENCOUNTER — Encounter (HOSPITAL_COMMUNITY): Payer: Self-pay | Admitting: Psychiatry

## 2018-12-18 DIAGNOSIS — G4701 Insomnia due to medical condition: Secondary | ICD-10-CM | POA: Diagnosis not present

## 2018-12-18 DIAGNOSIS — F333 Major depressive disorder, recurrent, severe with psychotic symptoms: Secondary | ICD-10-CM

## 2018-12-18 DIAGNOSIS — Z5181 Encounter for therapeutic drug level monitoring: Secondary | ICD-10-CM | POA: Diagnosis not present

## 2018-12-18 DIAGNOSIS — F4310 Post-traumatic stress disorder, unspecified: Secondary | ICD-10-CM | POA: Diagnosis not present

## 2018-12-18 MED ORDER — HYDROXYZINE PAMOATE 50 MG PO CAPS: 50.0000 mg | ORAL_CAPSULE | Freq: Three times a day (TID) | ORAL | 2 refills | Status: DC | PRN

## 2018-12-18 MED ORDER — LITHIUM CARBONATE ER 300 MG PO TBCR: EXTENDED_RELEASE_TABLET | ORAL | 2 refills | Status: DC

## 2018-12-18 MED ORDER — MIRTAZAPINE 45 MG PO TABS: 45.0000 mg | ORAL_TABLET | Freq: Every day | ORAL | 2 refills | Status: DC

## 2018-12-18 NOTE — Progress Notes (Signed)
Virtual Visit via Telephone Note  I connected with Melody Jenkins on 12/18/18 at 10:00 AM EDT by telephone and verified that I am speaking with the correct person using two identifiers.  Location: Patient: home Provider: office   I discussed the limitations, risks, security and privacy concerns of performing an evaluation and management service by telephone and the availability of in person appointments. I also discussed with the patient that there may be a patient responsible charge related to this service. The patient expressed understanding and agreed to proceed.   History of Present Illness: "today I am ok". She has been under a lot of stress. She did get her disability but she is waiting to get a check. Tzivia is owed a couple of months of pay from her old job. She is still fighting to get it. Ishanvi is financially struggling. The stress caused her to sleep for hours at a time. After waking she would feel drained and no energy to get back up. Now she realizes it was depression. Chalon was trying to get ahold of it but earlier this week she had to call her therapist. She talked about not having the strength to keep fighting for everything. Kassity was frustrated that she was not able to set up her SSI account. It made her depression worse. She was emotionally drained and was isolated. Kyrsten did go out after much persuasion with some friends on July 4th. She did not enjoy it and was exhausted by it. On Tuesday she was able to create the account and it triggered her check. That relieved some of her stress. The next day she went into a exhausted sleep and was told by her daughter that they discussed cleaning something. Pt slept deeply and missed her night meds. Jannifer just saw her PCP and does not know if she should go back. She thinks the sleep episodes that started last week are due to the depression and stress."I am just so tired. I don't have the strength to keep fighting. I want to give up".   Jamesina wants to get a handle on it. Pt denies SI/HI. Her mother will be coming to help her next week with her fiances. Pt is just so overwhelmed that she feels she can't make rational decisions. Evadean is concerned about this exhausting naps that she can't control. Pt wonders if is could be related to depression or her DM. Her PTSD has been triggered by all this stress.    Observations/Objective: I spoke with Melody Jenkins on the phone.  Pt was calm, pleasant and cooperative.  Pt was engaged in the conversation and answered questions appropriately.  Speech was clear and coherent with normal rate, tone and volume.  Mood is depressed and anxious, affect is congruent. Thought processes are coherent and circumstantial.  Thought content is with ruminations.  Pt denies SI/HI.   Pt denies auditory and visual hallucinations and did not appear to be responding to internal stimuli.  Memory and concentration are good.  Fund of knowledge and use of language are average.  Insight and judgment are fair.  I am unable to comment on psychomotor activity, general appearance, hygiene, or eye contact as I was unable to physically see the patient on the phone.   Assessment and Plan: MDD-recurrent, severe without psychotic features; PTSD; Insomnia  Increase Lithium ER 300mg  and 600mg  Jenkins qD. Labs on 11/17/2018 shows normal BUN and creatinine. Ordered lithium level   Vistaril 50mg  Jenkins TID prn anxiety  Remeron 45mg   Jenkins qHS  Clonidine 0.2mg  BID prn- prescribed by PCP  Pt is on a number of sedating meds and BP meds. We discussed this possibly contributing to her sleeping issues.   Follow Up Instructions: In 10-12 weeks   I discussed the assessment and treatment plan with the patient. The patient was provided an opportunity to ask questions and all were answered. The patient agreed with the plan and demonstrated an understanding of the instructions.   The patient was advised to call back or seek an in-person evaluation  if the symptoms worsen or if the condition fails to improve as anticipated.  I provided 35 minutes of non-face-to-face time during this encounter.   Charlcie Cradle, MD

## 2018-12-19 ENCOUNTER — Encounter: Payer: Self-pay | Admitting: Family Medicine

## 2018-12-19 DIAGNOSIS — L739 Follicular disorder, unspecified: Secondary | ICD-10-CM

## 2018-12-19 DIAGNOSIS — L709 Acne, unspecified: Secondary | ICD-10-CM

## 2018-12-22 ENCOUNTER — Telehealth: Payer: Self-pay

## 2018-12-22 NOTE — Telephone Encounter (Signed)
Received [employer] application from patient.  Faxed completed [employer] application to [phone].

## 2018-12-23 ENCOUNTER — Other Ambulatory Visit: Payer: Self-pay

## 2018-12-23 ENCOUNTER — Encounter (HOSPITAL_COMMUNITY): Payer: Self-pay | Admitting: Licensed Clinical Social Worker

## 2018-12-23 ENCOUNTER — Ambulatory Visit (INDEPENDENT_AMBULATORY_CARE_PROVIDER_SITE_OTHER): Payer: Medicare Other | Admitting: Licensed Clinical Social Worker

## 2018-12-23 DIAGNOSIS — F332 Major depressive disorder, recurrent severe without psychotic features: Secondary | ICD-10-CM

## 2018-12-23 DIAGNOSIS — F4310 Post-traumatic stress disorder, unspecified: Secondary | ICD-10-CM

## 2018-12-23 NOTE — Progress Notes (Signed)
Comprehensive Clinical Assessment (CCA) Note  12/23/2018 Melody Jenkins [id number]  Visit Diagnosis:      ICD-10-CM   1. Severe episode of recurrent major depressive disorder, without psychotic features ([place])  F33.2   2. PTSD (post-traumatic stress disorder)  F43.10       CCA Part One  Part One has been completed on paper by the patient.  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 Type of Services Patient Feels Are Needed: OP therapy and medication management  Mental Health Symptoms Depression:  Depression: Change in energy/activity, Difficulty Concentrating, Fatigue, Hopelessness, Increase/decrease in appetite, Irritability, Sleep (too much or little), Tearfulness, Weight gain/loss  Mania:     Anxiety:   Anxiety: Difficulty concentrating, Fatigue, Irritability, Restlessness, Sleep, Tension, Worrying  Psychosis:     Trauma:  Trauma: Avoids reminders of event, Detachment from others, Difficulty staying/falling asleep, Irritability/anger, Emotional numbing  Obsessions:  Obsessions: N/A  Compulsions:  Compulsions: N/A  Inattention:  Inattention: N/A  Hyperactivity/Impulsivity:     Oppositional/Defiant Behaviors:  Oppositional/Defiant Behaviors: N/A  Borderline Personality:  Emotional Irregularity: N/A  Other Mood/Personality Symptoms:      Mental Status Exam Appearance and self-care  Stature:  Stature: Tall  Weight:  Weight:  Average weight  Clothing:  Clothing: Casual  Grooming:  Grooming: Normal  Cosmetic use:  Cosmetic Use: Age appropriate  Posture/gait:  Posture/Gait: Normal  Motor activity:  Motor Activity: Not Remarkable  Sensorium  Attention:  Attention: Distractible  Concentration:  Concentration: Anxiety interferes  Orientation:  Orientation: X5  Recall/memory:  Recall/Memory: Defective in short-term, Defective in immediate, Defective in Remote  Affect and Mood  Affect:  Affect: Anxious  Mood:  Mood: Anxious, Depressed  Relating  Eye contact:  Eye Contact: Normal  Facial expression:  Facial Expression: Anxious  Attitude toward examiner:  Attitude Toward Examiner: Cooperative  Thought and Language  Speech flow: Speech Flow: Normal  Thought content:  Thought Content: Appropriate to mood and circumstances  Preoccupation:  Preoccupations: Obsessions  Hallucinations:     Organization:     Transport planner of Knowledge:  Fund of Knowledge: Impoverished by:  (Comment)  Intelligence:  Intelligence: Average  Abstraction:  Abstraction: Normal  Judgement:  Judgement: Fair  Art therapist:  Reality Testing: Adequate  Insight:  Insight: Fair  Decision Making:  Decision Making: Impulsive  Social Functioning  Social Maturity:  Social Maturity: Impulsive  Social Judgement:  Social Judgement: Normal  Stress  Stressors:  Stressors: Family conflict, Grief/losses, Illness, Transitions  Coping Ability:  Coping Ability: Deficient supports, Theatre stage manager, English as a second language teacher Deficits:     Supports:      Family and Psychosocial History: Family history Marital status: Single Does patient have children?: Yes How many children?: 1 How is patient's relationship with their children?: she lives with her now assisting her due to her chronic pain  Childhood History:  Childhood History By whom was/is the patient raised?: Both parents Additional childhood history information: raised by both parents. father  died when i was 16 Description of patient's relationship with caregiver when they were a child:  I was a daddy's girl Patient's description of current relationship with people who raised him/her: father is deceased, good relationship with mother How were you disciplined when you got in trouble as a child/adolescent?: I was a good child, I didn't really get disciplined, Maybe some time out Does patient have siblings?: Yes Number of Siblings: 2 Description of patient's current relationship with siblings: i brother deceased, 1 brother addict Did patient suffer any verbal/emotional/physical/sexual abuse as a child?: No Did patient suffer from severe childhood neglect?: No Has patient ever been sexually abused/assaulted/raped as an adolescent or adult?: No Was the patient ever a victim of a crime or a disaster?: No Witnessed domestic violence?: No Has patient been effected by domestic violence as an adult?: No  CCA Part Two B  Employment/Work Situation: Employment / Work Copywriter, advertising Employment situation: On disability Why is patient on disability: chronic back pain, mental health issues How long has patient been on disability: 1 month Patient's job has been impacted by current illness: Yes Describe how patient's job has been impacted: went on ltd from job at [employer] What is the longest time patient has a held a job?: 20 years Where was the patient employed at that time?: [place] Did You Receive Any Psychiatric Treatment/Services While in the [employer]?: No Are There Guns or Other Weapons in [place]?: No  Education: Education Last Grade Completed: 12 Did Teacher, 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 Did Physicist, medical?: Yes What Type of College Degree Do you Have?: Associate degree What Was Your Major?: business Did You Have An Individualized Education Program (IIEP): No Did You Have Any Difficulty At School?: No  Religion: Religion/Spirituality Are You A Religious Person?: Yes What is Your  Religious Affiliation?: Personal assistant: Leisure / Recreation Leisure and Hobbies: see friends, dogs  Exercise/Diet: Exercise/Diet Do You Exercise?: No Have You Gained or Lost A Significant Amount of Weight in the Past Six Months?: Yes-Gained Do You Follow a Special Diet?: No Do You Have Any Trouble Sleeping?: Yes Explanation of Sleeping Difficulties: can't stay asleep, can't fall asleep  CCA Part Two C  Alcohol/Drug Use: Alcohol / Drug Use History of alcohol / drug use?: No history of alcohol / drug abuse                      CCA Part Three  ASAM's:  Six Dimensions of Multidimensional Assessment  Dimension 1:  Acute Intoxication and/or Withdrawal Potential:     Dimension 2:  Biomedical Conditions and Complications:     Dimension 3:  Emotional, Behavioral, or Cognitive Conditions and Complications:     Dimension 4:  Readiness to Change:     Dimension 5:  Relapse, Continued use, or Continued Problem Potential:     Dimension 6:  Recovery/Living Environment:      Substance use Disorder (SUD)    Social Function:  Social Functioning Social Maturity: Impulsive Social Judgement: Normal  Stress:  Stress Stressors: Family conflict, Grief/losses, Illness, Transitions Coping Ability: Deficient supports, Theatre stage manager, Overwhelmed Patient Takes Medications The Way The Doctor Instructed?: Yes Priority Risk: Low Acuity  Risk Assessment- Self-Harm Potential: Risk Assessment For Self-Harm Potential Thoughts of Self-Harm: No current thoughts Method: No plan Availability of Means: No access/NA  Risk Assessment -Dangerous to Others Potential: Risk Assessment For Dangerous to Others Potential Method: No Plan Availability of Means: No access or NA Intent: Vague intent or NA Notification Required: No need or identified person  DSM5 Diagnoses: Patient Active Problem List  Diagnosis Date Noted  . Vitamin D deficiency 11/19/2018  . Chronic back pain   . Special  screening for malignant neoplasms, colon 08/16/2017  . Diabetes mellitus without complication ([place]) 79/15/0569  . Leg swelling 02/13/2017  . Chronic constipation 11/20/2016  . Hot flashes 11/20/2016  . Tear of MCL (medial collateral ligament) of knee, left, subsequent encounter 04/06/2016  . Anterolisthesis 04/06/2016  . Situational mixed anxiety and depressive disorder 02/26/2016  . Insomnia 02/24/2016  . Back pain with radiation 02/14/2016  . Essential hypertension, benign 12/26/2015  . DDD (degenerative disc disease), lumbar 12/26/2015  . Seasonal allergies 12/26/2015  . Obesity 04/06/2015  . Anemia   . Asthma   . Depression   . Hyperlipidemia     Patient Centered Plan: Patient is on the following Treatment Plan(s):  Depression, anxiety, ptsd  Recommendations for Services/Supports/Treatments: Recommendations for Services/Supports/Treatments Recommendations For Services/Supports/Treatments: Individual Therapy, Medication Management  Treatment Plan Summary: depression, anxiety, ptsd    Referrals to Alternative Service(s): Referred to Alternative Service(s):   Place:   Date:   Time:    Referred to Alternative Service(s):   Place:   Date:   Time:    Referred to Alternative Service(s):   Place:   Date:   Time:    Referred to Alternative Service(s):   Place:   Date:   Time:     [name]

## 2018-12-25 ENCOUNTER — Encounter: Payer: Self-pay | Admitting: Obstetrics and Gynecology

## 2018-12-25 ENCOUNTER — Other Ambulatory Visit: Payer: Self-pay

## 2018-12-25 ENCOUNTER — Other Ambulatory Visit (HOSPITAL_COMMUNITY)
Admission: RE | Admit: 2018-12-25 | Discharge: 2018-12-25 | Disposition: A | Discharge status: Home / Self Care | Payer: Medicare Other | Source: Ambulatory Visit | Attending: Obstetrics and Gynecology | Admitting: Obstetrics and Gynecology

## 2018-12-25 ENCOUNTER — Ambulatory Visit: Payer: Medicare Other | Admitting: Obstetrics and Gynecology

## 2018-12-25 VITALS — BP 122/80 | HR 84 | Temp 97.7°F | Ht 67.75 in | Wt 251.0 lb

## 2018-12-25 DIAGNOSIS — Z780 Asymptomatic menopausal state: Secondary | ICD-10-CM | POA: Diagnosis not present

## 2018-12-25 DIAGNOSIS — Z01419 Encounter for gynecological examination (general) (routine) without abnormal findings: Secondary | ICD-10-CM | POA: Diagnosis not present

## 2018-12-25 DIAGNOSIS — Z124 Encounter for screening for malignant neoplasm of cervix: Secondary | ICD-10-CM | POA: Insufficient documentation

## 2018-12-25 DIAGNOSIS — Z1151 Encounter for screening for human papillomavirus (HPV): Secondary | ICD-10-CM | POA: Insufficient documentation

## 2018-12-25 NOTE — Progress Notes (Signed)
53 y.o. [id number] Single Black or African American Not Hispanic or Latino female here for annual exam. Spoke with patient. Last menstrual period was 05/19/2016 (approximate).In the spring of 2019 the patient was evaluated for PMP bleeding. Inactive endometrium on biopsy, fibroid uterus, but negative sonohysterogram, AGUS pap, negative HPV,unsatisfactory colposcopy, benign ECC.Last [facility] was just in the menopausal range.  She took the provera in July and had a cycle. No cycle in May after taking provera, she took it in August and had no bleeding. Took Provera again in Sept and did not have any bleeding. Has not had any additional bleeding or pain.  She has a Administrator, her depression has improved. Stress is better, just approved for disability.   She is trying to loose weight to help her diabetes. She has chronic lower back pain, she is in pain management (percocet, gabapentin and flexeril). She gets epidural shots which helps.   Back injury is from a fall in the [employer] in 2017 (severe fall, unconscious, torn a ligament in her left leg and injured her back. She has a bulging disc and nerve damage. May be able to have surgery, not sure if it will help her pain. She has tried PT, acupuncture, dry needling.  Her Aunt died from complications of orthopedic surgery, so she is scared to have the surgery.  Patient's last menstrual period was 12/17/2017.          Sexually active: No.  The current method of family planning is post menopausal status.    Exercising: Yes.    walking Smoker:  no  Health Maintenance: Pap:  08/29/2017 atypical glanular cells, 04-06-15 WNL  History of abnormal Pap:  ECC 09/17/2017 WNL MMG:  01/28/2018 Birads 1 negative Colonoscopy: 09/05/2017 polyps BMD:   Never TDaP:  Unsure Gardasil: N/A   reports that she quit smoking about 8 months ago. Her smoking use included cigarettes. She has a 7.50 pack-year smoking history. She has never used smokeless tobacco.  She reports that she does not drink alcohol or use drugs. She is on disability for chronic back pain. Daughter is still living with her. Fiance died unexpectedly in 02-21-2023, he was her support.   Past Medical History:  Diagnosis Date  . Anemia   . Anxiety   . Asthma   . Carpal tunnel syndrome   . Chronic back pain   . Depression   . History of multiple miscarriages   . Hyperlipidemia     Past Surgical History:  Procedure Laterality Date  . BREAST SURGERY  2000   reduction  . CARPAL TUNNEL RELEASE     left  . CESAREAN SECTION    . COSMETIC SURGERY  2000   breast reduction  . REDUCTION MAMMAPLASTY Bilateral     Current Outpatient Medications  Medication Sig Dispense Refill  . Biotin 5000 MCG CAPS Take 1 capsule by mouth.    . Blood Glucose Monitoring Suppl (BLOOD GLUCOSE SYSTEM PAK) KIT Please dispense based on patient and insurance preference. Use as directed to monitor FSBS 1x daily. Dx: R73.09. 1 each 0  . Cholecalciferol (VITAMIN D3) 25 MCG (1000 UT) CAPS Take by mouth 2 (two) times daily.    . clindamycin-benzoyl peroxide (BENZACLIN) gel APPLY TOPICALLY TO FACE TWICE A DAY 25 g 2  . cloNIDine (CATAPRES) 0.2 MG tablet Take 1 tablet (0.2 mg total) by mouth 2 (two) times daily. 60 tablet 2  . Cyanocobalamin (VITAMIN B 12 PO) Take 1 tablet by mouth daily.     Marland Kitchen  cyclobenzaprine (FLEXERIL) 10 MG tablet Take 1 tablet (10 mg total) by mouth 3 (three) times daily as needed for muscle spasms. 30 tablet 0  . furosemide (LASIX) 20 MG tablet TAKE 1 TABLET BY MOUTH EVERY DAY 30 tablet 6  . gabapentin (NEURONTIN) 300 MG capsule Take 600 mg by mouth 3 (three) times daily.    . Glucose Blood (BLOOD GLUCOSE TEST STRIPS) STRP Please dispense based on patient and insurance preference. Use as directed to monitor FSBS 1x daily. Dx: R73.09. 100 each 1  . Lancets MISC Please dispense based on patient and insurance preference. Use as directed to monitor FSBS 1x daily. Dx: R73.09. 100 each 1  . lithium  carbonate (LITHOBID) 300 MG CR tablet Take 1 tab (384m) po qAM and 2 tabs (6029m at bedtime 90 tablet 2  . meloxicam (MOBIC) 7.5 MG tablet Take 7.5 mg by mouth 2 (two) times daily.  5  . metFORMIN (GLUCOPHAGE) 500 MG tablet Take 1 tablet (500 mg total) by mouth 2 (two) times daily with a meal. 180 tablet 3  . methocarbamol (ROBAXIN) 500 MG tablet TAKE 1 TABLET (500 MG TOTAL) BY MOUTH EVERY 8 (EIGHT) HOURS AS NEEDED FOR MUSCLE SPASMS. 60 tablet 0  . metoprolol succinate (TOPROL-XL) 100 MG 24 hr tablet Take 1 tablet (100 mg total) by mouth daily. Take with or immediately following a meal. 30 tablet 6  . mirtazapine (REMERON) 45 MG tablet Take 1 tablet (45 mg total) by mouth at bedtime. 30 tablet 2  . Multiple Vitamins-Minerals (AIRBORNE PO) Take by mouth daily.    . Marland KitchenxyCODONE-acetaminophen (PERCOCET/ROXICET) 5-325 MG tablet Take by mouth 3 (three) times daily.    . Prucalopride Succinate (MOTEGRITY) 2 MG TABS Take 2 mg by mouth daily. 30 tablet 0  . vitamin C (ASCORBIC ACID) 500 MG tablet Take 500 mg by mouth daily.    . Vitamin D, Ergocalciferol, (DRISDOL) 1.25 MG (50000 UT) CAPS capsule Take 1 capsule (50,000 Units total) by mouth every 7 (seven) days. 12 capsule 1  . vitamin E (VITAMIN E) 400 UNIT capsule Take 400 Units by mouth daily.     No current facility-administered medications for this visit.     Family History  Problem Relation Age of Onset  . Diabetes Mother   . Hypertension Mother   . Hypertension Father   . Heart disease Father   . Early death Brother   . Kidney disease Maternal Grandmother   . Heart disease Brother   . Colon cancer Neg Hx   . Rectal cancer Neg Hx     Review of Systems  Constitutional: Negative.   HENT: Negative.   Eyes: Negative.   Respiratory: Negative.   Cardiovascular: Negative.   Gastrointestinal: Negative.   Endocrine: Negative.   Genitourinary: Negative.   Musculoskeletal: Negative.   Skin: Negative.   Allergic/Immunologic: Negative.    Neurological: Negative.   Hematological: Negative.   Psychiatric/Behavioral: Negative.     Exam:   BP 122/80 (BP Location: Right Arm, Patient Position: Sitting, Cuff Size: Large)   Pulse 84   Temp 97.7 F (36.5 C) (Skin)   Ht 5' 7.75" (1.721 m)   Wt 251 lb (113.9 kg)   LMP 12/17/2017   BMI 38.45 kg/m   Weight change: _0 @ Height:   Height: 5' 7.75" (172.1 cm)  Ht Readings from Last 3 Encounters:  12/25/18 5' 7.75" (1.721 m)  11/19/18 5' 8" (1.727 m)  10/01/18 5' 8" (1.727 m)    General appearance: alert,  cooperative and appears stated age Head: Normocephalic, without obvious abnormality, atraumatic Neck: no adenopathy, supple, symmetrical, trachea midline and thyroid normal to inspection and palpation Lungs: clear to auscultation bilaterally Cardiovascular: regular rate and rhythm Breasts: normal appearance, no masses or tenderness Abdomen: soft, non-tender; non distended,  no masses,  no organomegaly Extremities: extremities normal, atraumatic, no cyanosis or edema Skin: Skin color, texture, turgor normal. No rashes or lesions Lymph nodes: Cervical, supraclavicular, and axillary nodes normal. No abnormal inguinal nodes palpated Neurologic: Grossly normal   Pelvic: External genitalia:  no lesions              Urethra:  normal appearing urethra with no masses, tenderness or lesions              Bartholins and Skenes: normal                 Vagina: normal appearing vagina with normal color and discharge, no lesions              Cervix: no lesions               Bimanual Exam:  Uterus:  normal size, contour, position, consistency, mobility, non-tender              Adnexa: no mass, fullness, tenderness               Rectovaginal: Confirms               Anus:  normal sphincter tone, no lesions  Chaperone was present for exam.  A:  Well Woman with normal exam  Multiple medical issues, managed by primary, pain clinic and orthopedics.  P:   Pap with  hpv  Mammogram in 8/20  Colonoscopy in 2 more years  Discussed breast self exam  Discussed calcium and vit D intake  Labs with primary MD

## 2018-12-25 NOTE — Patient Instructions (Signed)
EXERCISE AND DIET:  We recommended that you start or continue a regular exercise program for good health. Regular exercise means any activity that makes your heart beat faster and makes you sweat.  We recommend exercising at least 30 minutes per day at least 3 days a week, preferably 4 or 5.  We also recommend a diet low in fat and sugar.  Inactivity, poor dietary choices and obesity can cause diabetes, heart attack, stroke, and kidney damage, among others.    ALCOHOL AND SMOKING:  Women should limit their alcohol intake to no more than 7 drinks/beers/glasses of wine (combined, not each!) per week. Moderation of alcohol intake to this level decreases your risk of breast cancer and liver damage. And of course, no recreational drugs are part of a healthy lifestyle.  And absolutely no smoking or even second hand smoke. Most people know smoking can cause heart and lung diseases, but did you know it also contributes to weakening of your bones? Aging of your skin?  Yellowing of your teeth and nails?  CALCIUM AND VITAMIN D:  Adequate intake of calcium and Vitamin D are recommended.  The recommendations for exact amounts of these supplements seem to change often, but generally speaking 1,200 mg of calcium (between diet and supplement) and 800 units of Vitamin D per day seems prudent. Certain women may benefit from higher intake of Vitamin D.  If you are among these women, your doctor will have told you during your visit.    PAP SMEARS:  Pap smears, to check for cervical cancer or precancers,  have traditionally been done yearly, although recent scientific advances have shown that most women can have pap smears less often.  However, every woman still should have a physical exam from her gynecologist every year. It will include a breast check, inspection of the vulva and vagina to check for abnormal growths or skin changes, a visual exam of the cervix, and then an exam to evaluate the size and shape of the uterus and  ovaries.  And after 54 years of age, a rectal exam is indicated to check for rectal cancers. We will also provide age appropriate advice regarding health maintenance, like when you should have certain vaccines, screening for sexually transmitted diseases, bone density testing, colonoscopy, mammograms, etc.   MAMMOGRAMS:  All women over 40 years old should have a yearly mammogram. Many facilities now offer a "3D" mammogram, which may cost around $50 extra out of pocket. If possible,  we recommend you accept the option to have the 3D mammogram performed.  It both reduces the number of women who will be called back for extra views which then turn out to be normal, and it is better than the routine mammogram at detecting truly abnormal areas.    COLON CANCER SCREENING: Now recommend starting at age 45. At this time colonoscopy is not covered for routine screening until 50. There are take home tests that can be done between 45-49.   COLONOSCOPY:  Colonoscopy to screen for colon cancer is recommended for all women at age 50.  We know, you hate the idea of the prep.  We agree, BUT, having colon cancer and not knowing it is worse!!  Colon cancer so often starts as a polyp that can be seen and removed at colonscopy, which can quite literally save your life!  And if your first colonoscopy is normal and you have no family history of colon cancer, most women don't have to have it again for   10 years.  Once every ten years, you can do something that may end up saving your life, right?  We will be happy to help you get it scheduled when you are ready.  Be sure to check your insurance coverage so you understand how much it will cost.  It may be covered as a preventative service at no cost, but you should check your particular policy.      Breast Self-Awareness Breast self-awareness means being familiar with how your breasts look and feel. It involves checking your breasts regularly and reporting any changes to your  health care provider. Practicing breast self-awareness is important. A change in your breasts can be a sign of a serious medical problem. Being familiar with how your breasts look and feel allows you to find any problems early, when treatment is more likely to be successful. All women should practice breast self-awareness, including women who have had breast implants. How to do a breast self-exam One way to learn what is normal for your breasts and whether your breasts are changing is to do a breast self-exam. To do a breast self-exam: Look for Changes  1. Remove all the clothing above your waist. 2. Stand in front of a mirror in a room with good lighting. 3. Put your hands on your hips. 4. Push your hands firmly downward. 5. Compare your breasts in the mirror. Look for differences between them (asymmetry), such as: ? Differences in shape. ? Differences in size. ? Puckers, dips, and bumps in one breast and not the other. 6. Look at each breast for changes in your skin, such as: ? Redness. ? Scaly areas. 7. Look for changes in your nipples, such as: ? Discharge. ? Bleeding. ? Dimpling. ? Redness. ? A change in position. Feel for Changes Carefully feel your breasts for lumps and changes. It is best to do this while lying on your back on the floor and again while sitting or standing in the shower or tub with soapy water on your skin. Feel each breast in the following way:  Place the arm on the side of the breast you are examining above your head.  Feel your breast with the other hand.  Start in the nipple area and make  inch (2 cm) overlapping circles to feel your breast. Use the pads of your three middle fingers to do this. Apply light pressure, then medium pressure, then firm pressure. The light pressure will allow you to feel the tissue closest to the skin. The medium pressure will allow you to feel the tissue that is a little deeper. The firm pressure will allow you to feel the tissue  close to the ribs.  Continue the overlapping circles, moving downward over the breast until you feel your ribs below your breast.  Move one finger-width toward the center of the body. Continue to use the  inch (2 cm) overlapping circles to feel your breast as you move slowly up toward your collarbone.  Continue the up and down exam using all three pressures until you reach your armpit.  Write Down What You Find  Write down what is normal for each breast and any changes that you find. Keep a written record with breast changes or normal findings for each breast. By writing this information down, you do not need to depend only on memory for size, tenderness, or location. Write down where you are in your menstrual cycle, if you are still menstruating. If you are having trouble noticing differences   in your breasts, do not get discouraged. With time you will become more familiar with the variations in your breasts and more comfortable with the exam. How often should I examine my breasts? Examine your breasts every month. If you are breastfeeding, the best time to examine your breasts is after a feeding or after using a breast pump. If you menstruate, the best time to examine your breasts is 5-7 days after your period is over. During your period, your breasts are lumpier, and it may be more difficult to notice changes. When should I see my health care provider? See your health care provider if you notice:  A change in shape or size of your breasts or nipples.  A change in the skin of your breast or nipples, such as a reddened or scaly area.  Unusual discharge from your nipples.  A lump or thick area that was not there before.  Pain in your breasts.  Anything that concerns you.  

## 2018-12-25 NOTE — Addendum Note (Signed)
Addended by: Dorothy Spark on: 12/25/2018 05:06 PM   Modules accepted: Orders

## 2018-12-30 ENCOUNTER — Ambulatory Visit (HOSPITAL_COMMUNITY): Payer: Medicaid Other | Admitting: Licensed Clinical Social Worker

## 2018-12-30 LAB — CYTOLOGY - PAP
Adequacy: ABSENT
Diagnosis: NEGATIVE
HPV: NOT DETECTED

## 2018-12-31 ENCOUNTER — Ambulatory Visit: Payer: Medicaid Other | Admitting: Orthopaedic Surgery

## 2018-12-31 ENCOUNTER — Other Ambulatory Visit: Payer: Self-pay

## 2018-12-31 MED ORDER — FLUCONAZOLE 150 MG PO TABS: 150.0000 mg | ORAL_TABLET | Freq: Once | ORAL | 0 refills | Status: AC

## 2019-01-06 ENCOUNTER — Ambulatory Visit (HOSPITAL_COMMUNITY): Payer: Medicaid Other | Admitting: Licensed Clinical Social Worker

## 2019-01-14 DIAGNOSIS — Z6841 Body Mass Index (BMI) 40.0 and over, adult: Secondary | ICD-10-CM | POA: Diagnosis not present

## 2019-01-14 DIAGNOSIS — I10 Essential (primary) hypertension: Secondary | ICD-10-CM | POA: Diagnosis not present

## 2019-01-14 DIAGNOSIS — M48061 Spinal stenosis, lumbar region without neurogenic claudication: Secondary | ICD-10-CM | POA: Diagnosis not present

## 2019-01-19 DIAGNOSIS — M19039 Primary osteoarthritis, unspecified wrist: Secondary | ICD-10-CM | POA: Diagnosis not present

## 2019-01-19 DIAGNOSIS — M65321 Trigger finger, right index finger: Secondary | ICD-10-CM | POA: Diagnosis not present

## 2019-01-19 DIAGNOSIS — M1812 Unilateral primary osteoarthritis of first carpometacarpal joint, left hand: Secondary | ICD-10-CM | POA: Diagnosis not present

## 2019-01-20 ENCOUNTER — Telehealth: Payer: Self-pay | Admitting: Orthopaedic Surgery

## 2019-01-20 NOTE — Telephone Encounter (Signed)
Called patient left message to return call to schedule and appointment with Dr Ninfa Linden for severe shoulder pain and left inside of knee

## 2019-01-27 ENCOUNTER — Encounter: Payer: Self-pay | Admitting: Orthopaedic Surgery

## 2019-01-27 ENCOUNTER — Other Ambulatory Visit: Payer: Self-pay

## 2019-01-27 ENCOUNTER — Ambulatory Visit (INDEPENDENT_AMBULATORY_CARE_PROVIDER_SITE_OTHER): Payer: Medicare Other | Admitting: Orthopaedic Surgery

## 2019-01-27 DIAGNOSIS — M25562 Pain in left knee: Secondary | ICD-10-CM

## 2019-01-27 DIAGNOSIS — M1712 Unilateral primary osteoarthritis, left knee: Secondary | ICD-10-CM | POA: Diagnosis not present

## 2019-01-27 DIAGNOSIS — M79605 Pain in left leg: Secondary | ICD-10-CM

## 2019-01-27 DIAGNOSIS — M25511 Pain in right shoulder: Secondary | ICD-10-CM

## 2019-01-27 DIAGNOSIS — G8929 Other chronic pain: Secondary | ICD-10-CM

## 2019-01-27 MED ORDER — METHYLPREDNISOLONE ACETATE 40 MG/ML IJ SUSP
40.0000 mg | INTRAMUSCULAR | Status: AC | PRN
  Administered 2019-01-27: 40 mg via INTRA_ARTICULAR

## 2019-01-27 MED ORDER — LIDOCAINE HCL 1 % IJ SOLN
3.0000 mL | INTRAMUSCULAR | Status: AC | PRN
  Administered 2019-01-27: 3 mL

## 2019-01-27 NOTE — Progress Notes (Signed)
Office Visit Note   Patient: Melody Jenkins           Date of Birth: [birth year]           MRN: [id number] Visit Date: 01/27/2019              Requested by: Alycia Rossetti, MD [address] [place],  [place] [address] PCP: Alycia Rossetti, MD   Assessment & Plan: Visit Diagnoses: No diagnosis found.  Plan: I would like to send her for vascular surgery referral to see if there is any recommendations they have for treating painful varicose veins of her left lower extremity.  Also provide a steroid injection her left knee and we are awaiting her hyaluronic acid injection with Synvisc 1 to arrive so we can put that in her left knee.  I did provide a steroid injection in her right shoulder subacromial space today so we can see if this will help.  The next time we will see her is in follow-up to place the hyaluronic acid injection into the left knee.  We can then see how she is doing overall from her right shoulder standpoint.  Again we will make a vascular surgery referral as well.  Follow-Up Instructions: No follow-ups on file.   Orders:  No orders of the defined types were placed in this encounter.  No orders of the defined types were placed in this encounter.     Procedures: Large Joint Inj: L knee on 01/27/2019 11:09 AM Indications: diagnostic evaluation and pain Details: 22 G 1.5 in needle, superolateral approach  Arthrogram: No  Medications: 3 mL lidocaine 1 %; 40 mg methylPREDNISolone acetate 40 MG/ML Outcome: tolerated well, no immediate complications Procedure, treatment alternatives, risks and benefits explained, specific risks discussed. Consent was given by the patient. Immediately prior to procedure a time out was called to verify the correct patient, procedure, equipment, support staff and site/side marked as required. Patient was prepped and draped in the usual sterile fashion.   Large Joint Inj: R subacromial bursa on 01/27/2019 11:10 AM Indications: pain and  diagnostic evaluation Details: 22 G 1.5 in needle  Arthrogram: No  Medications: 3 mL lidocaine 1 %; 40 mg methylPREDNISolone acetate 40 MG/ML Outcome: tolerated well, no immediate complications Procedure, treatment alternatives, risks and benefits explained, specific risks discussed. Consent was given by the patient. Immediately prior to procedure a time out was called to verify the correct patient, procedure, equipment, support staff and site/side marked as required. Patient was prepped and draped in the usual sterile fashion.       Clinical Data: No additional findings.   Subjective: Chief Complaint  Patient presents with  . Left Knee - Pain  . Right Shoulder - Pain  The patient is well-known to [place].  She has been dealing with acute on chronic right shoulder pain and definitely chronic left knee pain for some time now.  We are trying to work on getting her coverage for hyaluronic acid injection with Synvisc 1 into her left knee due to her chronic pain.  She would like to have a steroid injection in that knee today as well as in her right shoulder.  I have also recommended Voltaren gel for her.  She also points to significantly painful and bulging varicosities/varicose veins around her posterior left knee and calf area that is all medial.  HPI  Review of Systems She currently denies any headache, chest pain, shortness of breath, fever, chills, nausea, vomiting  Objective: Vital Signs: LMP 12/17/2017   Physical Exam She is alert and orient x3 and in no acute distress Ortho Exam Examination of her right shoulder shows a lot of guarding and pain at a portion of exam but is deftly painful along the subacromial outlet.  Her shoulder is clinically well located.  Examination of her left knee shows no effusion.  She is very painful along the medial compartment of her knee and the medial collateral ligament.  She has prominent and painful varicose veins along the posterior medial aspect of  the proximal calf and distal femur area. Specialty Comments:  No specialty comments available.  Imaging: No results found.   PMFS History: Patient Active Problem List   Diagnosis Date Noted  . Vitamin D deficiency 11/19/2018  . Chronic back pain   . Special screening for malignant neoplasms, colon 08/16/2017  . Diabetes mellitus without complication ([place]) 53/74/8270  . Leg swelling 02/13/2017  . Chronic constipation 11/20/2016  . Hot flashes 11/20/2016  . Tear of MCL (medial collateral ligament) of knee, left, subsequent encounter 04/06/2016  . Anterolisthesis 04/06/2016  . Situational mixed anxiety and depressive disorder 02/26/2016  . Insomnia 02/24/2016  . Back pain with radiation 02/14/2016  . Essential hypertension, benign 12/26/2015  . DDD (degenerative disc disease), lumbar 12/26/2015  . Seasonal allergies 12/26/2015  . Obesity 04/06/2015  . Anemia   . Asthma   . Depression   . Hyperlipidemia    Past Medical History:  Diagnosis Date  . Anemia   . Anxiety   . Asthma   . Carpal tunnel syndrome   . Chronic back pain   . Depression   . History of multiple miscarriages   . Hyperlipidemia     Family History  Problem Relation Age of Onset  . Diabetes Mother   . Hypertension Mother   . Hypertension Father   . Heart disease Father   . Early death Brother   . Kidney disease Maternal Grandmother   . Heart disease Brother   . Colon cancer Neg Hx   . Rectal cancer Neg Hx     Past Surgical History:  Procedure Laterality Date  . BREAST SURGERY  2000   reduction  . CARPAL TUNNEL RELEASE     left  . CESAREAN SECTION    . COSMETIC SURGERY  2000   breast reduction  . REDUCTION MAMMAPLASTY Bilateral    Social History   Occupational History  . Occupation: disablity  Tobacco Use  . Smoking status: Former Smoker    Packs/day: 0.25    Years: 30.00    Pack years: 7.50    Types: Cigarettes    Quit date: 03/29/2018    Years since quitting: 0.8  . Smokeless  tobacco: Never Used  Substance and Sexual Activity  . Alcohol use: No  . Drug use: No  . Sexual activity: Not Currently    Partners: Male    Birth control/protection: Post-menopausal

## 2019-02-01 ENCOUNTER — Other Ambulatory Visit: Payer: Self-pay | Admitting: Gastroenterology

## 2019-02-01 ENCOUNTER — Other Ambulatory Visit: Payer: Self-pay | Admitting: Family Medicine

## 2019-02-02 ENCOUNTER — Telehealth: Payer: Self-pay

## 2019-02-02 NOTE — Telephone Encounter (Signed)
Received PRF form from [employer]. Faxed completed PRF form to [employer] at [phone].

## 2019-02-06 ENCOUNTER — Other Ambulatory Visit: Payer: Self-pay | Admitting: Family Medicine

## 2019-02-12 ENCOUNTER — Ambulatory Visit (INDEPENDENT_AMBULATORY_CARE_PROVIDER_SITE_OTHER): Payer: Medicare Other | Admitting: Psychiatry

## 2019-02-12 ENCOUNTER — Encounter (HOSPITAL_COMMUNITY): Payer: Self-pay | Admitting: Psychiatry

## 2019-02-12 ENCOUNTER — Encounter: Payer: Self-pay | Admitting: Family Medicine

## 2019-02-12 ENCOUNTER — Other Ambulatory Visit: Payer: Self-pay

## 2019-02-12 DIAGNOSIS — G4701 Insomnia due to medical condition: Secondary | ICD-10-CM

## 2019-02-12 DIAGNOSIS — F333 Major depressive disorder, recurrent, severe with psychotic symptoms: Secondary | ICD-10-CM

## 2019-02-12 DIAGNOSIS — F411 Generalized anxiety disorder: Secondary | ICD-10-CM | POA: Diagnosis not present

## 2019-02-12 MED ORDER — MIRTAZAPINE 45 MG PO TABS: 45.0000 mg | ORAL_TABLET | Freq: Every day | ORAL | 3 refills | Status: DC

## 2019-02-12 MED ORDER — HYDROXYZINE HCL 50 MG PO TABS: 50.0000 mg | ORAL_TABLET | Freq: Three times a day (TID) | ORAL | 3 refills | Status: DC | PRN

## 2019-02-12 MED ORDER — LITHIUM CARBONATE ER 300 MG PO TBCR: EXTENDED_RELEASE_TABLET | ORAL | 3 refills | Status: DC

## 2019-02-12 NOTE — Progress Notes (Signed)
Virtual Visit via Telephone Note  I connected with Melody Jenkins  on 02/12/19 by telephone and verified that I am speaking with the correct person using two identifiers.  Location: Patient: home Provider: office   I discussed the limitations, risks, security and privacy concerns of performing an evaluation and management service by telephone and the availability of in person appointments. I also discussed with the patient that there may be a patient responsible charge related to this service. The patient expressed understanding and agreed to proceed.   History of Present Illness: "I feel so much better. I am saying I am %100 because I have some bad days". Pt reports her birthday was yesterday and she will have a birthday party this Saturday. She is very happy about it and has been smiling all week. Pt recently bought a Tree surgeon and a king size bed. She is now sleeping in her bedroom for the last one month. Melody Jenkins really likes it and sometimes has to fight the desire to go back into the living room. Pt slept on the couch for 3 years. Pt is sleeping better and is more rested.  She does feel depressed. Her thoughts about her family member who is dying make her depressed. When that happens she will call her friend or visit her friend. Her health issues continue. The pain is ongoing. Pt has a home health aid and her daughter helps a lot. Melody Jenkins is not having to do that much around the house. Pt is now on Medicare and is getting her check. All this has been a huge stress reliever. In August seemed to "turned and everything was positive". She continues to pray every night and that helps with her anxiety. This month will be hard due to the anniversary of Orlando's death. She has had some dreams about him but not as bad. Next month will be better. Pt denies SI/HI. "I don't think I would be ok without the medications". "I am headed to a good place where I can have some control". Melody Jenkins still struggles  with bad thoughts and has poor stress tolerance. She feels that talking with psychiatrist and therapist really help. The increase in Lithium has been effective.     Observations/Objective: I spoke with Melody Jenkins on the phone.  Pt was calm, pleasant and cooperative.  Pt was engaged in the conversation and answered questions appropriately.  Speech was clear and coherent with normal rate, tone and volume.  Mood is mildly depressed and anxious, affect is full and bright. Thought processes are coherent, goal oriented and intact.  Thought content is logical.  Pt denies SI/HI.   Pt denies auditory and visual hallucinations and did not appear to be responding to internal stimuli.  Memory and concentration are good.  Fund of knowledge and use of language are average.  Insight and judgment are fair.  I am unable to comment on psychomotor activity, general appearance, hygiene, or eye contact as I was unable to physically see the patient on the phone.  Vital signs not available since interview conducted virtually.    Assessment and Plan: MDD-recurrent, severe without psychotic features; PTSD; Insomnia   Status of current symptoms: improved depression  Meds: Lithium ER 300mg  Jenkins qAM and 600mg  Jenkins qPM  Vistaril 50mg  Jenkins TID prn anxiety  Remeron 45mg  Jenkins qHS for depression  Clonidine 0.2mg  Jenkins BID prn- prescribed by PCP  Pt is on a number of sedating meds and BP meds. I will monitor.  Follow Up Instructions: In 8-10 weeks or sooner if needed   I discussed the assessment and treatment plan with the patient. The patient was provided an opportunity to ask questions and all were answered. The patient agreed with the plan and demonstrated an understanding of the instructions.   The patient was advised to call back or seek an in-person evaluation if the symptoms worsen or if the condition fails to improve as anticipated.  I provided 25 minutes of non-face-to-face time during this encounter.

## 2019-02-20 ENCOUNTER — Ambulatory Visit (INDEPENDENT_AMBULATORY_CARE_PROVIDER_SITE_OTHER): Payer: Medicare Other | Admitting: Family Medicine

## 2019-02-20 ENCOUNTER — Other Ambulatory Visit: Payer: Self-pay

## 2019-02-20 ENCOUNTER — Encounter: Payer: Self-pay | Admitting: Family Medicine

## 2019-02-20 VITALS — BP 128/70 | HR 92 | Temp 98.8°F | Resp 14 | Ht 67.75 in | Wt 240.0 lb

## 2019-02-20 DIAGNOSIS — E119 Type 2 diabetes mellitus without complications: Secondary | ICD-10-CM | POA: Diagnosis not present

## 2019-02-20 DIAGNOSIS — E559 Vitamin D deficiency, unspecified: Secondary | ICD-10-CM | POA: Diagnosis not present

## 2019-02-20 DIAGNOSIS — E782 Mixed hyperlipidemia: Secondary | ICD-10-CM

## 2019-02-20 DIAGNOSIS — I10 Essential (primary) hypertension: Secondary | ICD-10-CM | POA: Diagnosis not present

## 2019-02-20 DIAGNOSIS — Z6836 Body mass index (BMI) 36.0-36.9, adult: Secondary | ICD-10-CM

## 2019-02-20 DIAGNOSIS — Z23 Encounter for immunization: Secondary | ICD-10-CM | POA: Diagnosis not present

## 2019-02-20 DIAGNOSIS — R32 Unspecified urinary incontinence: Secondary | ICD-10-CM | POA: Diagnosis not present

## 2019-02-20 LAB — URINALYSIS, ROUTINE W REFLEX MICROSCOPIC
Bilirubin Urine: NEGATIVE
Hyaline Cast: NONE SEEN /LPF
Leukocytes,Ua: NEGATIVE
Nitrite: NEGATIVE
Protein, ur: NEGATIVE
Specific Gravity, Urine: 1.02 (ref 1.001–1.03)
WBC, UA: NONE SEEN /HPF (ref 0–5)
pH: 5 (ref 5.0–8.0)

## 2019-02-20 LAB — MICROSCOPIC MESSAGE

## 2019-02-20 NOTE — Progress Notes (Signed)
Subjective:    Patient ID: Melody Jenkins, female    DOB: 03/13/1966, 53 y.o.   MRN: [id number]  Patient presents for Follow-up (is fsating)   Pt here to follow-up chronic medical problems.  She had concerns about being on metformin as she recently saw the recall for the extended release.  Of note she is not on the extended release metformin Her last A1c 7.7% in June, did not bring meter, she has intentionally lost weight  Hypertension she is on multiple medications metoprolol, clonidine and Lasix which helps control swelling.  Still followed by her pain physician as well as her psychiatrist.  She is on lithium from psychiatry along with mirtazapine, this has helped her, she had a visit last week He is also still maintained on Percocet and gabapentin She did have epidural 1 month ago Dr. Brien Few, after the shot she states leg was paralyzed for an hour, she cant hold her urine as well now, when she walks urine will come out, wearing depends  She has a f/u in 2 weeks  She is due for flu shot  Vitamin D def- currently on supplement   Hyperlipidemia- LDL  143, TG 211 in June , she is not on statin drug at this time     Review Of Systems:  GEN- denies fatigue, fever, weight loss,weakness, recent illness HEENT- denies eye drainage, change in vision, nasal discharge, CVS- denies chest pain, palpitations RESP- denies SOB, cough, wheeze ABD- denies N/V, change in stools, abd pain GU- denies dysuria, hematuria, dribbling, incontinence MSK- denies joint pain, muscle aches, injury Neuro- denies headache, dizziness, syncope, seizure activity       Objective:    BP 128/70   Pulse 92   Temp 98.8 F (37.1 C) (Oral)   Resp 14   Ht 5' 7.75" (1.721 m)   Wt 240 lb (108.9 kg)   LMP 12/17/2017   SpO2 100%   BMI 36.76 kg/m  GEN- NAD, alert and oriented x3 HEENT- PERRL, EOMI, non injected sclera, pink conjunctiva, MMM, oropharynx clear Neck- Supple, no thyromegaly CVS- RRR, no  murmur RESP-CTAB ABD-NABS,soft,NT,ND Neuro- sensation grossly in tact LE EXT- No edema Pulses- Radial, DP- 2+        Assessment & Plan:      Problem List Items Addressed This Visit      Unprioritized   Diabetes mellitus without complication ([place])    Fairly controlled, goal is A1C less than 7%  with weight loss may hit goal  discussed her metformin was not recalled, but did give option to change, she will keep for now      Relevant Orders   Hemoglobin A1c (Completed)   Microalbumin / creatinine urine ratio (Completed)   Essential hypertension, benign - Primary    Controlled no changes       Relevant Orders   CBC with Differential/Platelet (Completed)   Comprehensive metabolic panel (Completed)   Hyperlipidemia    Recheck lipids, has been working on dietary changes  Would benefit from statin drug       Relevant Orders   Lipid panel (Completed)   Obesity   Vitamin D deficiency   Relevant Orders   Vitamin D, 25-hydroxy (Completed)    Other Visit Diagnoses    Urinary incontinence, unspecified type       Referral to urology, she had some incontinence before then worse since this shot, advised to call her back surgeon as well, see if imaging needs to be repeated, I will  check UA for infection since she is diabetic  no saddle anesthesia    Relevant Orders   Urinalysis, Routine w reflex microscopic (Completed)   Ambulatory referral to Urology   Need for immunization against influenza       Relevant Orders   Flu Vaccine QUAD 36+ mos IM (Completed)   Need for tetanus, diphtheria, and acellular pertussis (Tdap) vaccine in patient of adolescent age or older       Relevant Orders   Tdap vaccine greater than or equal to 7yo IM (Completed)      Note: This dictation was prepared with Dragon dictation along with smaller phrase technology. Any transcriptional errors that result from this process are unintentional.

## 2019-02-20 NOTE — Patient Instructions (Addendum)
F/U 3 months  We will call with lab results  Referral to urology  Tetanus and flu shot given

## 2019-02-21 LAB — CBC WITH DIFFERENTIAL/PLATELET
Absolute Monocytes: 670 cells/uL (ref 200–950)
Basophils Absolute: 47 cells/uL (ref 0–200)
Basophils Relative: 0.7 %
Eosinophils Absolute: 121 cells/uL (ref 15–500)
Eosinophils Relative: 1.8 %
HCT: 46.5 % — ABNORMAL HIGH (ref 35.0–45.0)
Hemoglobin: 14.8 g/dL (ref 11.7–15.5)
Lymphs Abs: 2814 cells/uL (ref 850–3900)
MCH: 27.5 pg (ref 27.0–33.0)
MCHC: 31.8 g/dL — ABNORMAL LOW (ref 32.0–36.0)
MCV: 86.4 fL (ref 80.0–100.0)
MPV: 11.7 fL (ref 7.5–12.5)
Monocytes Relative: 10 %
Neutro Abs: 3049 cells/uL (ref 1500–7800)
Neutrophils Relative %: 45.5 %
Platelets: 235 10*3/uL (ref 140–400)
RBC: 5.38 10*6/uL — ABNORMAL HIGH (ref 3.80–5.10)
RDW: 14.2 % (ref 11.0–15.0)
Total Lymphocyte: 42 %
WBC: 6.7 10*3/uL (ref 3.8–10.8)

## 2019-02-21 LAB — MICROALBUMIN / CREATININE URINE RATIO
Creatinine, Urine: 77 mg/dL (ref 20–275)
Microalb Creat Ratio: 8 mcg/mg creat (ref <30)
Microalb, Ur: 0.6 mg/dL

## 2019-02-21 LAB — COMPREHENSIVE METABOLIC PANEL
AG Ratio: 1.6 (calc) (ref 1.0–2.5)
ALT: 27 U/L (ref 6–29)
AST: 17 U/L (ref 10–35)
Albumin: 4.1 g/dL (ref 3.6–5.1)
Alkaline phosphatase (APISO): 105 U/L (ref 37–153)
BUN: 13 mg/dL (ref 7–25)
CO2: 27 mmol/L (ref 20–32)
Calcium: 9.8 mg/dL (ref 8.6–10.4)
Chloride: 99 mmol/L (ref 98–110)
Creat: 0.89 mg/dL (ref 0.50–1.05)
Globulin: 2.6 g/dL (calc) (ref 1.9–3.7)
Glucose, Bld: 383 mg/dL — ABNORMAL HIGH (ref 65–99)
Potassium: 4.6 mmol/L (ref 3.5–5.3)
Sodium: 136 mmol/L (ref 135–146)
Total Bilirubin: 0.5 mg/dL (ref 0.2–1.2)
Total Protein: 6.7 g/dL (ref 6.1–8.1)

## 2019-02-21 LAB — HEMOGLOBIN A1C
Hgb A1c MFr Bld: 12.1 % of total Hgb — ABNORMAL HIGH (ref <5.7)
Mean Plasma Glucose: 301 (calc)
eAG (mmol/L): 16.6 (calc)

## 2019-02-21 LAB — LIPID PANEL
Cholesterol: 260 mg/dL — ABNORMAL HIGH (ref <200)
HDL: 36 mg/dL — ABNORMAL LOW (ref >=50)
LDL Cholesterol (Calc): 178 mg/dL (calc) — ABNORMAL HIGH
Non-HDL Cholesterol (Calc): 224 mg/dL (calc) — ABNORMAL HIGH (ref <130)
Total CHOL/HDL Ratio: 7.2 (calc) — ABNORMAL HIGH (ref <5.0)
Triglycerides: 246 mg/dL — ABNORMAL HIGH (ref <150)

## 2019-02-21 LAB — VITAMIN D 25 HYDROXY (VIT D DEFICIENCY, FRACTURES): Vit D, 25-Hydroxy: 39 ng/mL (ref 30–100)

## 2019-02-22 ENCOUNTER — Encounter: Payer: Self-pay | Admitting: Family Medicine

## 2019-02-22 NOTE — Assessment & Plan Note (Signed)
Controlled no changes 

## 2019-02-22 NOTE — Assessment & Plan Note (Signed)
Fairly controlled, goal is A1C less than 7%  with weight loss may hit goal  discussed her metformin was not recalled, but did give option to change, she will keep for now

## 2019-02-22 NOTE — Assessment & Plan Note (Signed)
Recheck lipids, has been working on dietary changes  Would benefit from statin drug

## 2019-02-24 DIAGNOSIS — L731 Pseudofolliculitis barbae: Secondary | ICD-10-CM | POA: Diagnosis not present

## 2019-02-25 ENCOUNTER — Encounter: Payer: Medicare Other | Admitting: Family

## 2019-02-25 ENCOUNTER — Encounter (HOSPITAL_COMMUNITY): Payer: Medicare Other

## 2019-02-26 ENCOUNTER — Other Ambulatory Visit: Payer: Self-pay

## 2019-02-26 DIAGNOSIS — I83892 Varicose veins of left lower extremities with other complications: Secondary | ICD-10-CM

## 2019-02-27 ENCOUNTER — Ambulatory Visit (HOSPITAL_COMMUNITY)
Admission: RE | Admit: 2019-02-27 | Discharge: 2019-02-27 | Disposition: A | Discharge status: Home / Self Care | Payer: Medicare Other | Source: Ambulatory Visit | Attending: Family | Admitting: Family

## 2019-02-27 ENCOUNTER — Ambulatory Visit (INDEPENDENT_AMBULATORY_CARE_PROVIDER_SITE_OTHER): Payer: Medicare Other | Admitting: Family

## 2019-02-27 ENCOUNTER — Other Ambulatory Visit: Payer: Self-pay

## 2019-02-27 ENCOUNTER — Encounter: Payer: Self-pay | Admitting: Family

## 2019-02-27 VITALS — BP 143/96 | HR 83 | Temp 98.1°F | Resp 14 | Ht 67.0 in | Wt 240.0 lb

## 2019-02-27 DIAGNOSIS — I83892 Varicose veins of left lower extremities with other complications: Secondary | ICD-10-CM

## 2019-02-27 DIAGNOSIS — I872 Venous insufficiency (chronic) (peripheral): Secondary | ICD-10-CM

## 2019-02-27 NOTE — Progress Notes (Signed)
Referred by:  Alycia Rossetti, MD [address] [place],  [place] [address]  Reason for referral: tender varicosities left calf   History of Present Illness  Melody Jenkins is a 53 y.o. (1965/11/06) female who presents with chief complaint: tender varicosities left calf.   The patient has had no history of DVT, + history of 5 pregnancies, no history of varicose vein, no history of venous stasis ulcers, no history of  Lymphedema and no history of skin changes in lower legs.  There is no family history of venous disorders.  The patient has not used compression stockings in the past.  She slipped and fell in 2017, with that event she sustained a left MCL tear and lumbar spine disc problem. She states that she is attending pain management for her lumbar spine pain issues.  She is seeing Dr. Mindi Slicker for her left knee MCL tear, has a knee brace for this which makes her knee feel better.    She noted painful varicose veins at her posterior upper left calf, sometime after she fell in 2017. Her left knee hurts, her low back hurts.  She has uncontrolled DM, A1C was 12.1 on 02-20-19. She quit smoking in October 2019, smoked 1/4 ppd x 30 years.   Past Medical History:  Diagnosis Date  . Anemia   . Anxiety   . Asthma   . Carpal tunnel syndrome   . Chronic back pain   . Depression   . History of multiple miscarriages   . Hyperlipidemia     Past Surgical History:  Procedure Laterality Date  . BREAST SURGERY  2000   reduction  . CARPAL TUNNEL RELEASE     left  . CESAREAN SECTION    . COSMETIC SURGERY  2000   breast reduction  . REDUCTION MAMMAPLASTY Bilateral     Social History   Socioeconomic History  . Marital status: Single    Spouse name: Not on file  . Number of children: 1  . Years of education: Not on file  . Highest education level: Not on file  Occupational History  . Occupation: disablity  Social Needs  . Financial resource strain: Not on file  . Food  insecurity    Worry: Not on file    Inability: Not on file  . Transportation needs    Medical: Not on file    Non-medical: Not on file  Tobacco Use  . Smoking status: Former Smoker    Packs/day: 0.25    Years: 30.00    Pack years: 7.50    Types: Cigarettes    Quit date: 03/29/2018    Years since quitting: 0.9  . Smokeless tobacco: Never Used  Substance and Sexual Activity  . Alcohol use: No  . Drug use: No  . Sexual activity: Not Currently    Partners: Male    Birth control/protection: Post-menopausal  Lifestyle  . Physical activity    Days per week: Not on file    Minutes per session: Not on file  . Stress: Not on file  Relationships  . Social Herbalist on phone: Not on file    Gets together: Not on file    Attends religious service: Not on file    Active member of club or organization: Not on file    Attends meetings of clubs or organizations: Not on file    Relationship status: Not on file  . Intimate partner violence  Fear of current or ex partner: Not on file    Emotionally abused: Not on file    Physically abused: Not on file    Forced sexual activity: Not on file  Other Topics Concern  . Not on file  Social History Narrative  . Not on file    Family History  Problem Relation Age of Onset  . Diabetes Mother   . Hypertension Mother   . Hypertension Father   . Heart disease Father   . Early death Brother   . Kidney disease Maternal Grandmother   . Heart disease Brother   . Colon cancer Neg Hx   . Rectal cancer Neg Hx     Current Outpatient Medications on File Prior to Visit  Medication Sig Dispense Refill  . Biotin 5000 MCG CAPS Take 1 capsule by mouth.    . Blood Glucose Monitoring Suppl (BLOOD GLUCOSE SYSTEM PAK) KIT Please dispense based on patient and insurance preference. Use as directed to monitor FSBS 1x daily. Dx: R73.09. 1 each 0  . Cholecalciferol (VITAMIN D3) 25 MCG (1000 UT) CAPS Take by mouth 2 (two) times daily.    .  clindamycin-benzoyl peroxide (BENZACLIN) gel APPLY TOPICALLY TO FACE TWICE A DAY 25 g 2  . cloNIDine (CATAPRES) 0.2 MG tablet Take 1 tablet (0.2 mg total) by mouth 2 (two) times daily. 60 tablet 2  . Cyanocobalamin (VITAMIN B 12 PO) Take 1 tablet by mouth daily.     . cyclobenzaprine (FLEXERIL) 10 MG tablet Take 1 tablet (10 mg total) by mouth 3 (three) times daily as needed for muscle spasms. 30 tablet 0  . furosemide (LASIX) 20 MG tablet TAKE 1 TABLET BY MOUTH EVERY DAY 30 tablet 6  . gabapentin (NEURONTIN) 300 MG capsule Take 600 mg by mouth 3 (three) times daily.    . Glucose Blood (BLOOD GLUCOSE TEST STRIPS) STRP Please dispense based on patient and insurance preference. Use as directed to monitor FSBS 1x daily. Dx: R73.09. 100 each 1  . hydrOXYzine (ATARAX/VISTARIL) 50 MG tablet Take 1 tablet (50 mg total) by mouth 3 (three) times daily as needed for anxiety. 90 tablet 3  . Lancets MISC Please dispense based on patient and insurance preference. Use as directed to monitor FSBS 1x daily. Dx: R73.09. 100 each 1  . lithium carbonate (LITHOBID) 300 MG CR tablet Take 1 tab (346m) po qAM and 2 tabs (6044m at bedtime 90 tablet 3  . meloxicam (MOBIC) 7.5 MG tablet Take 7.5 mg by mouth 2 (two) times daily.  5  . metFORMIN (GLUCOPHAGE) 500 MG tablet Take 1 tablet (500 mg total) by mouth 2 (two) times daily with a meal. 180 tablet 3  . methocarbamol (ROBAXIN) 500 MG tablet TAKE 1 TABLET (500 MG TOTAL) BY MOUTH EVERY 8 (EIGHT) HOURS AS NEEDED FOR MUSCLE SPASMS. 60 tablet 0  . metoprolol succinate (TOPROL-XL) 100 MG 24 hr tablet TAKE 1 TABLET (100 MG TOTAL) BY MOUTH DAILY. TAKE WITH OR IMMEDIATELY FOLLOWING A MEAL. 30 tablet 6  . mirtazapine (REMERON) 45 MG tablet Take 1 tablet (45 mg total) by mouth at bedtime. 30 tablet 3  . MOTEGRITY 2 MG TABS TAKE 1 TABLET BY MOUTH DAILY. 30 tablet 5  . Multiple Vitamins-Minerals (AIRBORNE PO) Take by mouth daily.    . Marland KitchenxyCODONE-acetaminophen (PERCOCET/ROXICET) 5-325  MG tablet Take by mouth 3 (three) times daily.    . vitamin C (ASCORBIC ACID) 500 MG tablet Take 500 mg by mouth daily.    .Marland Kitchen  Vitamin D, Ergocalciferol, (DRISDOL) 1.25 MG (50000 UT) CAPS capsule Take 1 capsule (50,000 Units total) by mouth every 7 (seven) days. 12 capsule 1  . vitamin E (VITAMIN E) 400 UNIT capsule Take 400 Units by mouth daily.     No current facility-administered medications on file prior to visit.     Allergies  Allergen Reactions  . Trazodone And Nefazodone Hives    REVIEW OF SYSTEMS: Cardiovascular: No chest pain, chest pressure, palpitations, orthopnea, or dyspnea on exertion. No claudication or rest pain,  No history of DVT or phlebitis. Pulmonary: No productive cough, +asthma (has not had to use her rescue inhaler for years), no wheezing. Neurologic: No weakness, paresthesias, aphasia, or amaurosis. No dizziness. Hematologic: No bleeding problems or clotting disorders. Musculoskeletal: + left knee pain and joint swelling since the fall in 2017, lumbar spine issues since the 2017 fall Gastrointestinal: No blood in stool or hematemesis Genitourinary: No dysuria or hematuria. Psychiatric:: + history of major depression after her fiance died, states she also had PTSD from this. Integumentary: No rashes or ulcers. Constitutional: No fever or chills.  Physical Examination Vitals:   02/27/19 1356  BP: (!) 143/96  Pulse: 83  Resp: 14  Temp: 98.1 F (36.7 C)  TempSrc: Temporal  SpO2: 98%  Weight: 240 lb (108.9 kg)  Height: '5\' 7"'  (1.702 m)   Body mass index is 37.59 kg/m.  PHYSICAL EXAMINATION: General: Obese female in NAD, walks with a cane, limp HEENT:  Mild acanthosis nigricans  Pulmonary: Respirations are non-labored Abdomen: Soft and non-tender with normal bowel sounds Musculoskeletal: There are no major deformities.   Neurologic: No focal weakness or paresthesias are detected, Extremity muscle strength: bilateral UE at 4/5, right LE at 3/5, left LE  at 2/3. Pain elicited in right hip and low back with right knee raise, pain elicited in left knee and medial aspect of left thigh with left knee raise Skin: There are no ulcer or rashes noted. Psychiatric: The patient has normal affect. Loquacious  Cardiovascular: There is a regular rate and rhythm without significant murmur appreciated.  A cluster of superficial varicosities at left poster upper calf, see photo below      Vascular: Vessel Right Left  Radial Palpable Palpable  Carotid Palpable, without bruit Palpable, without bruit  Aorta Not palpable N/A  Popliteal Not Not palpable Not Not palpable  PT Palpable Palpable  DP Palpable Not Palpable   Non-Invasive Vascular Imaging  Left LE Venous Duplex (Date: 02/27/2019):  +------+---------------+---------+-----------+----------+--------------+ LEFT  CompressibilityPhasicitySpontaneityPropertiesThrombus Aging +------+---------------+---------+-----------+----------+--------------+ CFV   Full           Yes      Yes                                 +------+---------------+---------+-----------+----------+--------------+ SFJ   Full           Yes      Yes                                 +------+---------------+---------+-----------+----------+--------------+ FV MidFull           Yes      Yes                                 +------+---------------+---------+-----------+----------+--------------+ POP   Full  Yes      Yes                                 +------+---------------+---------+-----------+----------+--------------+ GSV   Full           Yes      Yes                                 +------+---------------+---------+-----------+----------+--------------+ SSV   Full           Yes      Yes                                 +------+---------------+---------+-----------+----------+--------------+  Venous Reflux Times Normal value < 0.5 sec  +------------------------------+----------+---------+                               Right (ms)Left (ms) +------------------------------+----------+---------+ GSV at Saphenofemoral junction          3433.00   +------------------------------+----------+---------+ GSV prox thigh                          4387.00   +------------------------------+----------+---------+ GSV mid thigh                           1988.00   +------------------------------+----------+---------+ GSV dist thigh                          4790.00   +------------------------------+----------+---------+ GSV at knee                             5369.00   +------------------------------+----------+---------+  +------------------------------+----------+---------+ VEIN DIAMETERS:               Right (cm)Left (cm) +------------------------------+----------+---------+ GSV at Saphenofemoral junction          0.767     +------------------------------+----------+---------+ GSV at prox thigh                       0.439     +------------------------------+----------+---------+ GSV at mid thigh                        0.477     +------------------------------+----------+---------+ GSV at distal thigh                     0.447     +------------------------------+----------+---------+ GSV at knee                             0.497     +------------------------------+----------+---------+ GSV prox calf                           0.154     +------------------------------+----------+---------+ GSV mid calf                            0.158     +------------------------------+----------+---------+ SSV origin  0.269     +------------------------------+----------+---------+ SSV prox                                0.200     +------------------------------+----------+---------+ SSV mid                                 0.170      +------------------------------+----------+---------+ Summary: Left: No reflux was noted in the common femoral vein , femoral vein in the thigh, and popliteal vein.  Abnormal reflux times were noted in the great saphenous vein at the saphenofemoral junction, great saphenous vein at the proximal thigh, great saphenous vein at the mid thigh, great saphenous vein at the distal thigh, and great saphenous vein at the knee.   There is no evidence of deep vein thrombosis in the lower extremity within the CFV, FV, and POPV. There is no evidence of superficial venous thrombosis within the GSV and SSV.   Medical Decision Making  Melody Jenkins is a 53 y.o. female who presents with: tender superficial varicosities at posterior left calf that appeared sometime after she fell on her left leg in 2017.  She has no problems with her right leg. She has been dealing with a painful torn left MCL and lumbar spine injury, attends pain management, sees ortho.  Left leg venous duplex shows no DVT,  does show abnormal reflux times were noted in the great saphenous vein at the saphenofemoral junction, great saphenous vein at the proximal thigh, great saphenous vein at the mid thigh, great saphenous vein at the distal thigh, and great saphenous vein at the knee.   Based on the patient's history and examination, I recommend: thigh high compression hose and elevation of legs often.  I discussed with the patient the use of 20-30 mm thigh high compression stockings and need for 3 month trial of such.  The patient will follow up in 3 months with Dr. Scot Dock or Dr. Oneida Alar in the [facility] for evaluation for: GSV ablation or stab phlebectomy or continue conservative tx with compression hose and periodic elevation of legs.   ETI information given to pt to obtain low cost medical grade thigh high compression hose.   Thank you for allowing [place] to participate in this patient's care.  Clemon Chambers, RN, MSN, FNP-C  [facility] Office: [phone]  02/27/2019, 2:01 PM  Clinic MD: Donzetta Matters

## 2019-02-27 NOTE — Patient Instructions (Signed)
To decrease swelling in your feet and legs: Elevate feet above slightly bent knees, feet above heart, overnight and 3-4 times per day for 20 minutes.    Chronic Venous Insufficiency Chronic venous insufficiency is a condition where the leg veins cannot effectively pump blood from the legs to the heart. This happens when the vein walls are either stretched, weakened, or damaged, or when the valves inside the vein are damaged. With the right treatment, you should be able to continue with an active life. This condition is also called venous stasis. What are the causes? Common causes of this condition include:  High blood pressure inside the veins (venous hypertension).  Sitting or standing too long, causing increased blood pressure in the leg veins.  A blood clot that blocks blood flow in a vein (deep vein thrombosis, DVT).  Inflammation of a vein (phlebitis) that causes a blood clot to form.  Tumors in the pelvis that cause blood to back up. What increases the risk? The following factors may make you more likely to develop this condition:  Having a family history of this condition.  Obesity.  Pregnancy.  Living without enough regular physical activity or exercise (sedentary lifestyle).  Smoking.  Having a job that requires long periods of standing or sitting in one place.  Being a certain age. Women in their 56s and 20s and men in their 16s are more likely to develop this condition. What are the signs or symptoms? Symptoms of this condition include:  Veins that are enlarged, bulging, or twisted (varicose veins).  Skin breakdown or ulcers.  Reddened skin or dark discoloration of skin on the leg between the knee and ankle.  Brown, smooth, tight, and painful skin just above the ankle, usually on the inside of the leg (lipodermatosclerosis).  Swelling of the legs. How is this diagnosed? This condition may be diagnosed based on:  Your medical history.  A physical exam.   Tests, such as: ? A procedure that creates an image of a blood vessel and nearby organs and provides information about blood flow through the blood vessel (duplex ultrasound). ? A procedure that tests blood flow (plethysmography). ? A procedure that looks at the veins using X-ray and dye (venogram). How is this treated? The goals of treatment are to help you return to an active life and to minimize pain or disability. Treatment depends on the severity of your condition, and it may include:  Wearing compression stockings. These can help relieve symptoms and help prevent your condition from getting worse. However, they do not cure the condition.  Sclerotherapy. This procedure involves an injection of a solution that shrinks damaged veins.  Surgery. This may involve: ? Removing a diseased vein (vein stripping). ? Cutting off blood flow through the vein (laser ablation surgery). ? Repairing or reconstructing a valve within the affected vein. Follow these instructions at home:      Wear compression stockings as told by your health care provider. These stockings help to prevent blood clots and reduce swelling in your legs.  Take over-the-counter and prescription medicines only as told by your health care provider.  Stay active by exercising, walking, or doing different activities. Ask your health care provider what activities are safe for you and how much exercise you need.  Drink enough fluid to keep your urine pale yellow.  Do not use any products that contain nicotine or tobacco, such as cigarettes, e-cigarettes, and chewing tobacco. If you need help quitting, ask your health care provider.  Keep all follow-up visits as told by your health care provider. This is important. Contact a health care provider if you:  Have redness, swelling, or more pain in the affected area.  See a red streak or line that goes up or down from the affected area.  Have skin breakdown or skin loss in the  affected area, even if the breakdown is small.  Get an injury in the affected area. Get help right away if:  You get an injury and an open wound in the affected area.  You have: ? Severe pain that does not get better with medicine. ? Sudden numbness or weakness in the foot or ankle below the affected area. ? Trouble moving your foot or ankle. ? A fever. ? Worse or persistent symptoms. ? Chest pain. ? Shortness of breath. Summary  Chronic venous insufficiency is a condition where the leg veins cannot effectively pump blood from the legs to the heart.  Chronic venous insufficiency occurs when the vein walls become stretched, weakened, or damaged, or when valves within the vein are damaged.  Treatment depends on how severe your condition is. It often involves wearing compression stockings and may involve having a procedure.  Make sure you stay active by exercising, walking, or doing different activities. Ask your health care provider what activities are safe for you and how much exercise you need. This information is not intended to replace advice given to you by your health care provider. Make sure you discuss any questions you have with your health care provider. Document Released: 10/01/2006 Document Revised: 02/18/2018 Document Reviewed: 02/18/2018 Elsevier Patient Education  2020 [employer].

## 2019-03-02 ENCOUNTER — Encounter: Payer: Self-pay | Admitting: Family Medicine

## 2019-03-02 ENCOUNTER — Encounter: Payer: Self-pay | Admitting: Vascular Surgery

## 2019-03-03 ENCOUNTER — Other Ambulatory Visit: Payer: Self-pay | Admitting: *Deleted

## 2019-03-03 MED ORDER — FLUCONAZOLE 150 MG PO TABS: 150.0000 mg | ORAL_TABLET | Freq: Once | ORAL | 0 refills | Status: AC

## 2019-03-03 MED ORDER — METFORMIN HCL 500 MG PO TABS: 500.0000 mg | ORAL_TABLET | Freq: Two times a day (BID) | ORAL | 3 refills | Status: DC

## 2019-03-03 MED ORDER — ROSUVASTATIN CALCIUM 10 MG PO TABS: 10.0000 mg | ORAL_TABLET | Freq: Every day | ORAL | 3 refills | Status: DC

## 2019-03-06 ENCOUNTER — Telehealth: Payer: Self-pay

## 2019-03-06 NOTE — Telephone Encounter (Signed)
Called and left a VM advising patient that I received her gel injection from [facility] and to call the office back to schedule an appointment with Dr. Ninfa Linden.

## 2019-03-09 ENCOUNTER — Other Ambulatory Visit: Payer: Self-pay | Admitting: Family Medicine

## 2019-03-12 ENCOUNTER — Encounter: Payer: Self-pay | Admitting: Family Medicine

## 2019-03-12 MED ORDER — FLUCONAZOLE 150 MG PO TABS: 150.0000 mg | ORAL_TABLET | Freq: Once | ORAL | 0 refills | Status: AC

## 2019-03-18 ENCOUNTER — Encounter: Payer: Self-pay | Admitting: Orthopaedic Surgery

## 2019-03-18 ENCOUNTER — Telehealth: Payer: Self-pay

## 2019-03-18 ENCOUNTER — Ambulatory Visit (INDEPENDENT_AMBULATORY_CARE_PROVIDER_SITE_OTHER): Payer: Medicare HMO | Admitting: Orthopaedic Surgery

## 2019-03-18 DIAGNOSIS — M1712 Unilateral primary osteoarthritis, left knee: Secondary | ICD-10-CM

## 2019-03-18 DIAGNOSIS — M25562 Pain in left knee: Secondary | ICD-10-CM

## 2019-03-18 DIAGNOSIS — G8929 Other chronic pain: Secondary | ICD-10-CM

## 2019-03-18 DIAGNOSIS — M25511 Pain in right shoulder: Secondary | ICD-10-CM

## 2019-03-18 MED ORDER — LIDOCAINE HCL 1 % IJ SOLN
3.0000 mL | INTRAMUSCULAR | Status: AC | PRN
  Administered 2019-03-18: 3 mL

## 2019-03-18 MED ORDER — HYALURONAN 88 MG/4ML IX SOSY
88.0000 mg | PREFILLED_SYRINGE | INTRA_ARTICULAR | Status: AC | PRN
  Administered 2019-03-18: 88 mg via INTRA_ARTICULAR

## 2019-03-18 NOTE — Progress Notes (Signed)
   Procedure Note  Patient: Melody Jenkins             Date of Birth: [birth year]           MRN: [id number]             Visit Date: 03/18/2019  Procedures: Visit Diagnoses:  1. Chronic pain of left knee   2. Unilateral primary osteoarthritis, left knee   3. Chronic right shoulder pain     Large Joint Inj: L knee on 03/18/2019 3:41 PM Indications: diagnostic evaluation and pain Details: 22 G 1.5 in needle, superolateral approach  Arthrogram: No  Medications: 3 mL lidocaine 1 %; 88 mg Hyaluronan 88 MG/4ML Outcome: tolerated well, no immediate complications Procedure, treatment alternatives, risks and benefits explained, specific risks discussed. Consent was given by the patient. Immediately prior to procedure a time out was called to verify the correct patient, procedure, equipment, support staff and site/side marked as required. Patient was prepped and draped in the usual sterile fashion.    The patient is here today for a hyaluronic acid injection in her left knee with Monovisc.  We were able to submit to the company information for her hopefully having this injection which will be donated by the company at her for her lack of financial resources.  She understands we are placing this Monovisc injection in her left knee due to the chronic pain that she is having and given the fact that an MRI does show some cartilage wear in the medial compartment of her right knee.  She did tolerate the injection well without difficulties.  She still dealing with severe chronic right shoulder pain and I did place a steroid injection at her last visit in the right shoulder subacromial space.  Is still hurting her severely to the point that we will obtain an MRI of her right shoulder to rule out a rotator cuff tear or other pathologies causing her severe pain in her right shoulder.  We will see her back in about 4 weeks to go over the MRI of her right shoulder as well as see how she is done from the hyaluronic  acid injection in her left knee.

## 2019-03-18 NOTE — Telephone Encounter (Signed)
Approved for Monovisc, left knee. Purchased through [employer]

## 2019-03-19 ENCOUNTER — Other Ambulatory Visit: Payer: Self-pay

## 2019-03-19 DIAGNOSIS — G8929 Other chronic pain: Secondary | ICD-10-CM

## 2019-03-24 ENCOUNTER — Other Ambulatory Visit: Payer: Self-pay | Admitting: Family Medicine

## 2019-03-24 DIAGNOSIS — Z1231 Encounter for screening mammogram for malignant neoplasm of breast: Secondary | ICD-10-CM

## 2019-03-26 ENCOUNTER — Encounter: Payer: Self-pay | Admitting: Family Medicine

## 2019-04-07 DIAGNOSIS — R350 Frequency of micturition: Secondary | ICD-10-CM | POA: Diagnosis not present

## 2019-04-07 DIAGNOSIS — N3942 Incontinence without sensory awareness: Secondary | ICD-10-CM | POA: Diagnosis not present

## 2019-04-07 DIAGNOSIS — N3946 Mixed incontinence: Secondary | ICD-10-CM | POA: Diagnosis not present

## 2019-04-07 DIAGNOSIS — R351 Nocturia: Secondary | ICD-10-CM | POA: Diagnosis not present

## 2019-04-08 DIAGNOSIS — M545 Low back pain: Secondary | ICD-10-CM | POA: Diagnosis not present

## 2019-04-08 DIAGNOSIS — M48061 Spinal stenosis, lumbar region without neurogenic claudication: Secondary | ICD-10-CM | POA: Diagnosis not present

## 2019-04-10 ENCOUNTER — Ambulatory Visit
Admission: RE | Admit: 2019-04-10 | Discharge: 2019-04-10 | Disposition: A | Discharge status: Home / Self Care | Payer: Medicare HMO | Source: Ambulatory Visit | Attending: Orthopaedic Surgery | Admitting: Orthopaedic Surgery

## 2019-04-10 ENCOUNTER — Other Ambulatory Visit: Payer: Self-pay

## 2019-04-10 ENCOUNTER — Other Ambulatory Visit: Payer: Medicare HMO

## 2019-04-10 DIAGNOSIS — M25511 Pain in right shoulder: Secondary | ICD-10-CM

## 2019-04-10 DIAGNOSIS — G8929 Other chronic pain: Secondary | ICD-10-CM

## 2019-04-14 ENCOUNTER — Other Ambulatory Visit: Payer: Self-pay

## 2019-04-14 ENCOUNTER — Ambulatory Visit: Payer: Medicare HMO | Admitting: *Deleted

## 2019-04-14 DIAGNOSIS — I83812 Varicose veins of left lower extremities with pain: Secondary | ICD-10-CM

## 2019-04-14 NOTE — Progress Notes (Signed)
[name] for thigh high compression hose. Ankle 24 cm calf 41 cm thigh 56 cm.  Size 11 shoe Height 5'8"  Size Large [place] Walker open toed thigh high 20-30 mm Hg Color Black  Patient tried on compression hose for accurate fitting.  Care of compression hose discussed with patient.  Reminded Melody Jenkins of 3 month VV fu with Dr. Scot Dock 05-28-2019 at 3:00 PM.

## 2019-04-15 ENCOUNTER — Ambulatory Visit: Payer: Medicare HMO | Admitting: Orthopaedic Surgery

## 2019-04-16 ENCOUNTER — Other Ambulatory Visit: Payer: Self-pay

## 2019-04-16 ENCOUNTER — Encounter: Payer: Self-pay | Admitting: Physician Assistant

## 2019-04-16 ENCOUNTER — Ambulatory Visit (INDEPENDENT_AMBULATORY_CARE_PROVIDER_SITE_OTHER): Payer: Medicare HMO | Admitting: Physician Assistant

## 2019-04-16 DIAGNOSIS — M75121 Complete rotator cuff tear or rupture of right shoulder, not specified as traumatic: Secondary | ICD-10-CM

## 2019-04-16 NOTE — Progress Notes (Signed)
HPI: Melody Jenkins returns today to go over the MRI of her right shoulder.  She continues to have significant pain in the right shoulder.  Difficulty with full extension of the arm and reaching across her chest or reaching behind her.  She also notes that her left knee injection with Monovisc on 03/18/2019 did not give her any relief as of yet.  MRI images are reviewed with the patient today and a shoulder model was used to further demonstrate shoulder anatomy.  MRI of the right shoulder dated 04/10/2019 showed a large full-thickness retracted tear of the infraspinatus tendon as well as the posterior fibers of the supraspinatus tendon.  Defect was measured to be at least 3-1/2 cm in diameter.  Severe atrophy of the infraspinatus muscle was noted and moderate atrophy of the supraspinatus tendon was noted.  Degeneration of the biceps tendon was noted loose bodies within the bicipital sheath were also noted.  Type II acromion  Physical exam: Internal rotation against resistance bilateral shoulders 5 out of 5 strength.  Left shoulder 5 out of 5 strength with external rotation against resistance.  Right shoulder 4 out of 5 strength with external rotation against resistance.  Impression: Right shoulder rotator cuff tear  Plan: Recommend shoulder arthroscopy with arthroscopic debridement NSAID and possible rotator cuff repair.  However given patient's diabetic status and her last hemoglobin being 12.1 along with the moderate atrophy seen and the size of the rotator cuff tear repair may not be possible.  Did discuss this with the patient.  She understands this.  She understands also how controlled her diabetes can affect her healing postoperatively.  Therefore we will see her back in about 6 weeks after she seen her primary care physician states she can see what type of control she has on her diabetes.  Questions encouraged and answered at length.

## 2019-04-20 DIAGNOSIS — M12811 Other specific arthropathies, not elsewhere classified, right shoulder: Secondary | ICD-10-CM | POA: Insufficient documentation

## 2019-04-20 DIAGNOSIS — M461 Sacroiliitis, not elsewhere classified: Secondary | ICD-10-CM | POA: Diagnosis not present

## 2019-04-20 DIAGNOSIS — M48061 Spinal stenosis, lumbar region without neurogenic claudication: Secondary | ICD-10-CM | POA: Diagnosis not present

## 2019-04-20 DIAGNOSIS — Z79899 Other long term (current) drug therapy: Secondary | ICD-10-CM | POA: Diagnosis not present

## 2019-04-20 DIAGNOSIS — M47816 Spondylosis without myelopathy or radiculopathy, lumbar region: Secondary | ICD-10-CM | POA: Diagnosis not present

## 2019-04-23 ENCOUNTER — Encounter (HOSPITAL_COMMUNITY): Payer: Self-pay | Admitting: Psychiatry

## 2019-04-23 ENCOUNTER — Ambulatory Visit (INDEPENDENT_AMBULATORY_CARE_PROVIDER_SITE_OTHER): Payer: Medicare HMO | Admitting: Psychiatry

## 2019-04-23 ENCOUNTER — Other Ambulatory Visit: Payer: Self-pay

## 2019-04-23 DIAGNOSIS — F333 Major depressive disorder, recurrent, severe with psychotic symptoms: Secondary | ICD-10-CM

## 2019-04-23 DIAGNOSIS — F411 Generalized anxiety disorder: Secondary | ICD-10-CM

## 2019-04-23 DIAGNOSIS — G4701 Insomnia due to medical condition: Secondary | ICD-10-CM | POA: Diagnosis not present

## 2019-04-23 MED ORDER — MIRTAZAPINE 45 MG PO TABS: 45.0000 mg | ORAL_TABLET | Freq: Every day | ORAL | 3 refills | Status: DC

## 2019-04-23 MED ORDER — LITHIUM CARBONATE ER 300 MG PO TBCR: EXTENDED_RELEASE_TABLET | ORAL | 3 refills | Status: DC

## 2019-04-23 MED ORDER — HYDROXYZINE HCL 50 MG PO TABS: 50.0000 mg | ORAL_TABLET | Freq: Three times a day (TID) | ORAL | 3 refills | Status: DC | PRN
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
## 2019-04-27 ENCOUNTER — Encounter: Payer: Self-pay | Admitting: Family Medicine

## 2019-04-29 ENCOUNTER — Encounter: Payer: Self-pay | Admitting: Orthopaedic Surgery

## 2019-04-29 ENCOUNTER — Other Ambulatory Visit: Payer: Self-pay

## 2019-04-29 ENCOUNTER — Ambulatory Visit (INDEPENDENT_AMBULATORY_CARE_PROVIDER_SITE_OTHER): Payer: Medicare HMO | Admitting: Orthopaedic Surgery

## 2019-04-29 DIAGNOSIS — G8929 Other chronic pain: Secondary | ICD-10-CM | POA: Diagnosis not present

## 2019-04-29 DIAGNOSIS — M75121 Complete rotator cuff tear or rupture of right shoulder, not specified as traumatic: Secondary | ICD-10-CM | POA: Diagnosis not present

## 2019-04-29 DIAGNOSIS — M25511 Pain in right shoulder: Secondary | ICD-10-CM | POA: Diagnosis not present

## 2019-04-29 DIAGNOSIS — M25512 Pain in left shoulder: Secondary | ICD-10-CM | POA: Diagnosis not present

## 2019-04-29 NOTE — Addendum Note (Signed)
Addended by: Precious Bard on: 04/29/2019 10:45 AM   Modules accepted: Orders

## 2019-04-29 NOTE — Progress Notes (Signed)
The patient comes today to continue go over issues as relates to her right shoulder.  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  She has severe chronic pain with her shoulder.  Her left shoulder is now hurting her as bad as her right shoulder.  Her last hemoglobin A1c in September was just over 12.  She is still working on better blood glucose control.  She understands we cannot proceed with surgery until her blood sugars are under better control due to the infection risk.  She says her next hemoglobin A1c and appointment will be in early December.  Both shoulders have severe pain on exam.  There is more weakness on the right side than the left side.  I would like to obtain an MRI of her left shoulder rule out rotator cuff tear.  We can order that and then go over that with her next appointment with me in a month from now.  She will need to also bring me the hemoglobin A1c result from her early December appointment.

## 2019-04-30 ENCOUNTER — Other Ambulatory Visit: Payer: Medicare HMO

## 2019-05-04 ENCOUNTER — Ambulatory Visit: Payer: Medicare HMO | Admitting: Orthopaedic Surgery

## 2019-05-12 ENCOUNTER — Other Ambulatory Visit: Payer: Self-pay

## 2019-05-12 ENCOUNTER — Ambulatory Visit
Admission: RE | Admit: 2019-05-12 | Discharge: 2019-05-12 | Disposition: A | Discharge status: Home / Self Care | Payer: Medicare HMO | Source: Ambulatory Visit | Attending: Family Medicine | Admitting: Family Medicine

## 2019-05-12 DIAGNOSIS — Z1231 Encounter for screening mammogram for malignant neoplasm of breast: Secondary | ICD-10-CM

## 2019-05-13 ENCOUNTER — Other Ambulatory Visit: Payer: Self-pay | Admitting: Family Medicine

## 2019-05-13 DIAGNOSIS — R928 Other abnormal and inconclusive findings on diagnostic imaging of breast: Secondary | ICD-10-CM

## 2019-05-20 ENCOUNTER — Other Ambulatory Visit: Payer: Self-pay

## 2019-05-20 ENCOUNTER — Ambulatory Visit
Admission: RE | Admit: 2019-05-20 | Discharge: 2019-05-20 | Disposition: A | Discharge status: Home / Self Care | Payer: Medicare HMO | Source: Ambulatory Visit | Attending: Orthopaedic Surgery | Admitting: Orthopaedic Surgery

## 2019-05-20 DIAGNOSIS — M25512 Pain in left shoulder: Secondary | ICD-10-CM

## 2019-05-20 DIAGNOSIS — G8929 Other chronic pain: Secondary | ICD-10-CM

## 2019-05-20 DIAGNOSIS — M75122 Complete rotator cuff tear or rupture of left shoulder, not specified as traumatic: Secondary | ICD-10-CM | POA: Diagnosis not present

## 2019-05-21 ENCOUNTER — Ambulatory Visit
Admission: RE | Admit: 2019-05-21 | Discharge: 2019-05-21 | Disposition: A | Discharge status: Home / Self Care | Payer: Medicare HMO | Source: Ambulatory Visit | Attending: Family Medicine | Admitting: Family Medicine

## 2019-05-21 DIAGNOSIS — N6489 Other specified disorders of breast: Secondary | ICD-10-CM | POA: Diagnosis not present

## 2019-05-21 DIAGNOSIS — R928 Other abnormal and inconclusive findings on diagnostic imaging of breast: Secondary | ICD-10-CM

## 2019-05-27 ENCOUNTER — Other Ambulatory Visit: Payer: Self-pay

## 2019-05-27 ENCOUNTER — Encounter: Payer: Self-pay | Admitting: Family Medicine

## 2019-05-27 ENCOUNTER — Ambulatory Visit (INDEPENDENT_AMBULATORY_CARE_PROVIDER_SITE_OTHER): Payer: Medicare HMO | Admitting: Family Medicine

## 2019-05-27 VITALS — BP 138/76 | HR 80 | Temp 98.7°F | Resp 16 | Ht 67.0 in | Wt 232.0 lb

## 2019-05-27 DIAGNOSIS — E782 Mixed hyperlipidemia: Secondary | ICD-10-CM

## 2019-05-27 DIAGNOSIS — M75121 Complete rotator cuff tear or rupture of right shoulder, not specified as traumatic: Secondary | ICD-10-CM | POA: Diagnosis not present

## 2019-05-27 DIAGNOSIS — I10 Essential (primary) hypertension: Secondary | ICD-10-CM | POA: Diagnosis not present

## 2019-05-27 DIAGNOSIS — Z6836 Body mass index (BMI) 36.0-36.9, adult: Secondary | ICD-10-CM

## 2019-05-27 DIAGNOSIS — E1169 Type 2 diabetes mellitus with other specified complication: Secondary | ICD-10-CM | POA: Diagnosis not present

## 2019-05-27 DIAGNOSIS — E785 Hyperlipidemia, unspecified: Secondary | ICD-10-CM

## 2019-05-27 MED ORDER — METFORMIN HCL 1000 MG PO TABS: 1000.0000 mg | ORAL_TABLET | Freq: Two times a day (BID) | ORAL | 1 refills | Status: DC

## 2019-05-27 NOTE — Patient Instructions (Addendum)
F/U 3 months  Increase metformin to 1000mg  twice a day  We will call with lab results

## 2019-05-27 NOTE — Assessment & Plan Note (Signed)
Diabetes is uncontrolled we will increase her Metformin to 1000 mg twice a day.  We will recheck her A1c today as well.  She is on statin drug but is not fasting so I will get a direct LDL to look at her cholesterol today.  Discussed dietary changes that will improve her cholesterol and her blood sugars.  She seems very overwhelmed depressed regarding her now shoulder pain and the probability of surgical intervention.

## 2019-05-27 NOTE — Assessment & Plan Note (Signed)
Continue Crestor.  Will check liver function test.

## 2019-05-27 NOTE — Assessment & Plan Note (Addendum)
Bilateral shoulder pain with tears noted on the MRI.  She has follow-up with orthopedics in a couple weeks.  She needs to get her A1c down before she can have surgical intervention due to the increased risk of infection which I discussed with her.  In the meantime she is on multiple medications for pain including meloxicam, gabapentin, hydrocodone.  I would not recommend increasing any of these at this time.  Advised that she can use topicals and her shoulder use ice or heat whichever 1onehelps best.

## 2019-05-27 NOTE — Assessment & Plan Note (Signed)
Blood pressure is controlled no change in medication. 

## 2019-05-27 NOTE — Progress Notes (Signed)
Subjective:    Patient ID: Melody Jenkins, female    DOB: 1966/04/22, 53 y.o.   MRN: [id number]  Patient presents for Follow-up (is not fasting) Is here to follow-up chronic medical problems.  Medications reviewed.  Diabetes mellitus last A1c 12.1% back in September.  She was restarted on Metformin 500 mg twice a day.  She is stopped due to concern for the recall.  Her CBGs at home range he states have been higher than what she wants.  Up to the 300s at times.  No hypoglycemia symptoms.  She did not bring her meter with her today  Hypertension  she is taking her blood pressure medicine as prescribed no side effects of the medication  Hyperlipidemia-she was started on Crestor 10 mg at bedtime at her visit back in September.  Her LDL was elevated at 178 total cholesterol 260 triglycerides 246  Continues to follow-up with her multiple  joint specialist for her chronic pain ( Dr. Brien Few) Major depression she still followed by psychiatry Dr. Arnoldo Lenis. States every this is going wrong, She has had severe bilat  shoulder back , initially Right then left, seen by orthopedics with Dr. Ninfa Linden for 2nd option- she has   tear of rotator cuff on both sides with scar tissue she is already been told that she cannot have surgical intervention because of her uncontrolled diabetes.  Her pain in her shoulders and this new diagnosis has had her very stressed out recently.    Meds reviewed   Review Of Systems:  GEN- denies fatigue, fever, weight loss,weakness, recent illness HEENT- denies eye drainage, change in vision, nasal discharge, CVS- denies chest pain, palpitations RESP- denies SOB, cough, wheeze ABD- denies N/V, change in stools, abd pain GU- denies dysuria, hematuria, dribbling, incontinence MSK- +joint pain, muscle aches, injury Neuro- denies headache, dizziness, syncope, seizure activity       Objective:    BP 138/76   Pulse 80   Temp 98.7 F (37.1 C) (Temporal)   Resp 16   Ht  5\' 7"  (1.702 m)   Wt 232 lb (105.2 kg)   LMP 12/17/2017   SpO2 98%   BMI 36.34 kg/m  GEN- NAD, alert and oriented x3 HEENT- PERRL, EOMI, non injected sclera, pink conjunctiva,  Neck- Supple, no thyromegaly CVS- RRR, no murmur RESP-CTAB EXT- No edema Pulses- Radial2+        Assessment & Plan:      Problem List Items Addressed This Visit      Unprioritized   Essential hypertension, benign - Primary    Blood pressure is controlled no change in medication.      Relevant Orders   Comprehensive metabolic panel   LDL Cholesterol, Direct   Hyperlipidemia    Continue Crestor.  Will check liver function test.      Relevant Orders   LDL Cholesterol, Direct   Nontraumatic complete tear of right rotator cuff    Bilateral shoulder pain with tears noted on the MRI.  She has follow-up with orthopedics in a couple weeks.  She needs to get her A1c down before she can have surgical intervention due to the increased risk of infection which I discussed with her.  In the meantime she is on multiple medications for pain including meloxicam, gabapentin, hydrocodone.  I would not recommend increasing any of these at this time.  Advised that she can use topicals and her shoulder use ice or heat whichever 1onehelps best.      Obesity  Relevant Medications   metFORMIN (GLUCOPHAGE) 1000 MG tablet   Type 2 diabetes mellitus with hyperlipidemia ([place])    Diabetes is uncontrolled we will increase her Metformin to 1000 mg twice a day.  We will recheck her A1c today as well.  She is on statin drug but is not fasting so I will get a direct LDL to look at her cholesterol today.  Discussed dietary changes that will improve her cholesterol and her blood sugars.  She seems very overwhelmed depressed regarding her now shoulder pain and the probability of surgical intervention.      Relevant Medications   metFORMIN (GLUCOPHAGE) 1000 MG tablet   Other Relevant Orders   Hemoglobin A1c      Note: This  dictation was prepared with Dragon dictation along with smaller phrase technology. Any transcriptional errors that result from this process are unintentional.

## 2019-05-28 ENCOUNTER — Ambulatory Visit: Payer: Medicare Other | Admitting: Vascular Surgery

## 2019-05-28 DIAGNOSIS — Z7984 Long term (current) use of oral hypoglycemic drugs: Secondary | ICD-10-CM | POA: Diagnosis not present

## 2019-05-28 DIAGNOSIS — H5213 Myopia, bilateral: Secondary | ICD-10-CM | POA: Diagnosis not present

## 2019-05-28 DIAGNOSIS — E119 Type 2 diabetes mellitus without complications: Secondary | ICD-10-CM | POA: Diagnosis not present

## 2019-05-28 DIAGNOSIS — I10 Essential (primary) hypertension: Secondary | ICD-10-CM | POA: Diagnosis not present

## 2019-05-28 DIAGNOSIS — H524 Presbyopia: Secondary | ICD-10-CM | POA: Diagnosis not present

## 2019-05-28 DIAGNOSIS — H52221 Regular astigmatism, right eye: Secondary | ICD-10-CM | POA: Diagnosis not present

## 2019-05-28 LAB — HEMOGLOBIN A1C
Hgb A1c MFr Bld: 11.7 % of total Hgb — ABNORMAL HIGH (ref <5.7)
Mean Plasma Glucose: 289 (calc)
eAG (mmol/L): 16 (calc)

## 2019-05-28 LAB — LDL CHOLESTEROL, DIRECT: Direct LDL: 87 mg/dL (ref <100)

## 2019-05-28 LAB — COMPREHENSIVE METABOLIC PANEL
AG Ratio: 1.4 (calc) (ref 1.0–2.5)
ALT: 16 U/L (ref 6–29)
AST: 14 U/L (ref 10–35)
Albumin: 4.1 g/dL (ref 3.6–5.1)
Alkaline phosphatase (APISO): 91 U/L (ref 37–153)
BUN: 12 mg/dL (ref 7–25)
CO2: 26 mmol/L (ref 20–32)
Calcium: 10.2 mg/dL (ref 8.6–10.4)
Chloride: 102 mmol/L (ref 98–110)
Creat: 0.76 mg/dL (ref 0.50–1.05)
Globulin: 2.9 g/dL (calc) (ref 1.9–3.7)
Glucose, Bld: 125 mg/dL — ABNORMAL HIGH (ref 65–99)
Potassium: 4.5 mmol/L (ref 3.5–5.3)
Sodium: 141 mmol/L (ref 135–146)
Total Bilirubin: 0.4 mg/dL (ref 0.2–1.2)
Total Protein: 7 g/dL (ref 6.1–8.1)

## 2019-05-28 LAB — HM DIABETES EYE EXAM

## 2019-06-01 ENCOUNTER — Telehealth: Payer: Self-pay | Admitting: Orthopaedic Surgery

## 2019-06-01 ENCOUNTER — Other Ambulatory Visit: Payer: Self-pay | Admitting: Orthopaedic Surgery

## 2019-06-01 ENCOUNTER — Other Ambulatory Visit: Payer: Self-pay

## 2019-06-01 ENCOUNTER — Ambulatory Visit: Payer: Medicare HMO

## 2019-06-01 ENCOUNTER — Other Ambulatory Visit: Payer: Self-pay | Admitting: *Deleted

## 2019-06-01 DIAGNOSIS — Z7189 Other specified counseling: Secondary | ICD-10-CM

## 2019-06-01 MED ORDER — ACETAMINOPHEN-CODEINE #3 300-30 MG PO TABS: 1.0000 | ORAL_TABLET | Freq: Three times a day (TID) | ORAL | 0 refills | Status: DC | PRN

## 2019-06-01 MED ORDER — TRULICITY 0.75 MG/0.5ML ~~LOC~~ SOAJ: 0.7500 mg | SUBCUTANEOUS | 11 refills | Status: DC

## 2019-06-01 NOTE — Telephone Encounter (Signed)
Patient called requesting for Dr. Ninfa Linden or Artis Delay to send in an RX for her shoulder pain.  Patient uses [facility] on [address].  CB#[phone].  Thank you.

## 2019-06-01 NOTE — Telephone Encounter (Signed)
I can only send in some Tylenol 3 with codeine for chronic pain which she should limit taking.

## 2019-06-01 NOTE — Telephone Encounter (Signed)
Please advise 

## 2019-06-01 NOTE — Progress Notes (Signed)
Patient came in today for a demonstration of the Trulicity injection and to receive her first injection. Patient verbalized understanding/ Samples given to patient.

## 2019-06-02 DIAGNOSIS — M75101 Unspecified rotator cuff tear or rupture of right shoulder, not specified as traumatic: Secondary | ICD-10-CM | POA: Diagnosis not present

## 2019-06-02 DIAGNOSIS — M12811 Other specific arthropathies, not elsewhere classified, right shoulder: Secondary | ICD-10-CM | POA: Diagnosis not present

## 2019-06-02 DIAGNOSIS — M5440 Lumbago with sciatica, unspecified side: Secondary | ICD-10-CM | POA: Diagnosis not present

## 2019-06-02 DIAGNOSIS — M5416 Radiculopathy, lumbar region: Secondary | ICD-10-CM | POA: Diagnosis not present

## 2019-06-02 NOTE — Telephone Encounter (Signed)
LMOM for patient letting her know Rx was sent to her pharmacy

## 2019-06-03 ENCOUNTER — Encounter: Payer: Self-pay | Admitting: Family Medicine

## 2019-06-10 ENCOUNTER — Other Ambulatory Visit: Payer: Self-pay

## 2019-06-10 ENCOUNTER — Other Ambulatory Visit: Payer: Self-pay | Admitting: Family Medicine

## 2019-06-10 ENCOUNTER — Ambulatory Visit: Payer: Medicare HMO | Admitting: Physician Assistant

## 2019-06-11 ENCOUNTER — Ambulatory Visit: Payer: Medicare HMO | Admitting: Vascular Surgery

## 2019-06-17 ENCOUNTER — Other Ambulatory Visit (HOSPITAL_COMMUNITY): Payer: Self-pay | Admitting: Psychiatry

## 2019-06-17 DIAGNOSIS — F411 Generalized anxiety disorder: Secondary | ICD-10-CM

## 2019-06-22 ENCOUNTER — Ambulatory Visit (INDEPENDENT_AMBULATORY_CARE_PROVIDER_SITE_OTHER): Payer: Medicare HMO | Admitting: Orthopaedic Surgery

## 2019-06-22 ENCOUNTER — Ambulatory Visit (INDEPENDENT_AMBULATORY_CARE_PROVIDER_SITE_OTHER): Payer: Medicare HMO | Admitting: Family Medicine

## 2019-06-22 ENCOUNTER — Encounter: Payer: Self-pay | Admitting: Orthopaedic Surgery

## 2019-06-22 ENCOUNTER — Encounter: Payer: Self-pay | Admitting: Family Medicine

## 2019-06-22 ENCOUNTER — Other Ambulatory Visit: Payer: Self-pay

## 2019-06-22 VITALS — BP 136/82 | HR 90 | Temp 98.5°F | Resp 14 | Ht 67.0 in | Wt 229.0 lb

## 2019-06-22 DIAGNOSIS — E782 Mixed hyperlipidemia: Secondary | ICD-10-CM | POA: Diagnosis not present

## 2019-06-22 DIAGNOSIS — G8929 Other chronic pain: Secondary | ICD-10-CM | POA: Diagnosis not present

## 2019-06-22 DIAGNOSIS — E1169 Type 2 diabetes mellitus with other specified complication: Secondary | ICD-10-CM

## 2019-06-22 DIAGNOSIS — E785 Hyperlipidemia, unspecified: Secondary | ICD-10-CM

## 2019-06-22 DIAGNOSIS — S46012D Strain of muscle(s) and tendon(s) of the rotator cuff of left shoulder, subsequent encounter: Secondary | ICD-10-CM | POA: Diagnosis not present

## 2019-06-22 DIAGNOSIS — R110 Nausea: Secondary | ICD-10-CM | POA: Diagnosis not present

## 2019-06-22 DIAGNOSIS — M25512 Pain in left shoulder: Secondary | ICD-10-CM | POA: Diagnosis not present

## 2019-06-22 NOTE — Progress Notes (Signed)
Subjective:    Patient ID: Melody Jenkins, female    DOB: January 19, 1966, 54 y.o.   MRN: [id number]  Patient presents for Follow-up (is fasting) Patient here for interim follow-up on her diabetes mellitus. Her A1c returned at 11.7% on December 16.  Is recommended that she take Metformin at 1000 mg twice a day and add Trulicity [id number] mg once a week to also help with weight. She is here today to follow-up her blood sugars. Fasting blood sugars have ranged At the last visit she called in stating that she cannot tolerate the 2000 mg of Metformin that she has been taking  ( She is now on 1000mg  in the morning and 500mg ) but this is also still making her nauseous.  States that when she started the Trulicity along with the Metformin her nausea worsened as she had a few episodes of vomiting.  There is been occasional loose stool especially when she takes Metformin on an empty stomach.  She did not bring her meter with her today.  States that her blood sugar yesterday morning was 112 last night 126 that she cannot keep up with checking her sugars.  Reviewed her other labs at the bedside.  Her renal function was normal at 0.76 direct LDL 87 she does need triglycerides HDL and total cholesterol done  Weight down 3lbs in the past 3-4 weeks  Does however states that overall she can tell a difference in her body she does feel better that her blood sugars have come down with the exception of the nausea  Review Of Systems:  GEN- denies fatigue, fever, weight loss,weakness, recent illness HEENT- denies eye drainage, change in vision, nasal discharge, CVS- denies chest pain, palpitations RESP- denies SOB, cough, wheeze ABD- + N/V, change in stools, abd pain GU- denies dysuria, hematuria, dribbling, incontinence MSK- denies joint pain, muscle aches, injury Neuro- denies headache, dizziness, syncope, seizure activity       Objective:    BP 136/82   Pulse 90   Temp 98.5 F (36.9 C) (Temporal)   Resp  14   Ht 5\' 7"  (1.702 m)   Wt 229 lb (103.9 kg)   LMP 12/17/2017   SpO2 99%   BMI 35.87 kg/m  GEN- NAD, alert and oriented x3 HEENT- PERRL, EOMI, non injected sclera, pink conjunctiva CVS- RRR, no murmur RESP-CTAB ABD-NABS,soft,NT,ND EXT- No edema Pulses- Radial2+        Assessment & Plan:      Problem List Items Addressed This Visit      Unprioritized   Hyperlipidemia    She is fasting today we will get a complete lipid panel.  She is on the rosuvastatin.  Nausea as per above I think this is result of her medications which I have adjusted.  I will check a metabolic panel because of all of her other medications to make sure no renal dysfunction or problems with her potassium      Relevant Orders   Lipid Panel   Type 2 diabetes mellitus with hyperlipidemia ([place]) - Primary    Controlled diabetes mellitus with an A1c of 11%.  And she is having the nausea with both medications Minna decrease her Metformin to 500 mg twice a day for the next 2 weeks then increase her back to 1000 mg in the morning and 500 mg with dinner.  Voiced the importance of her taking the medication with food in order to decrease her symptoms.  The Trulicity will keep this the same at  once a week also advised that she eat with the medication.  Her blood sugars overall are improving significantly.  She wants to have shoulder surgery done but this cannot be done in the setting of her uncontrolled diabetes mellitus.  Goal is an A1c less than 8% before she can be cleared for surgery.      Relevant Orders   Basic metabolic panel    Other Visit Diagnoses    Nausea          Note: This dictation was prepared with Dragon dictation along with smaller phrase technology. Any transcriptional errors that result from this process are unintentional.

## 2019-06-22 NOTE — Patient Instructions (Addendum)
Check your blood sugar every morning  Decrease metformin to 500mg  in the morning and 500mg  at dinner and 2 weeks   Then  Increase metformin back to 1000mg  in the morning and 500mg  in the evening  Continue the trulicity once a week F/U 2 months

## 2019-06-22 NOTE — Assessment & Plan Note (Signed)
She is fasting today we will get a complete lipid panel.  She is on the rosuvastatin.  Nausea as per above I think this is result of her medications which I have adjusted.  I will check a metabolic panel because of all of her other medications to make sure no renal dysfunction or problems with her potassium

## 2019-06-22 NOTE — Progress Notes (Signed)
The patient is coming in today to go over an MRI of her left shoulder.  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  She is also had a fall 2 years ago affecting her left shoulder.  She has been having considerable pain in that shoulder.  In the interim since have seen her she has been still struggling with blood glucose control.  She is now on a different medication.  Her hemoglobin A1c in just December of this past year which was a month ago was 11.7.  She knows that that is one of the critical factors and determining whether or not she would be safe to proceed with any type of surgical intervention for the shoulders.  She does feel that she has had in the right direction.  She has lost 30 pounds since I started seeing her.  On examination both shoulders are stiff and painful.  The right seems to be worse than left in terms of pain and weakness.  The MRI is reviewed with her of the left shoulder and it does show an insertional tear that is near complaint of the rotator cuff tendons on the left side.  It is not as severe as the right side but certainly it is a source of her likely pain.  At this point we will see her back in about 2 months.  By then she will have a better idea what her blood glucose looks like.  I am encouraged by her weight loss.  Hopefully we will be able to proceed with a right shoulder arthroscopy safely in April or May of this year.  All question concerns were answered and addressed.

## 2019-06-22 NOTE — Assessment & Plan Note (Signed)
Controlled diabetes mellitus with an A1c of 11%.  And she is having the nausea with both medications Minna decrease her Metformin to 500 mg twice a day for the next 2 weeks then increase her back to 1000 mg in the morning and 500 mg with dinner.  Voiced the importance of her taking the medication with food in order to decrease her symptoms.  The Trulicity will keep this the same at once a week also advised that she eat with the medication.  Her blood sugars overall are improving significantly.  She wants to have shoulder surgery done but this cannot be done in the setting of her uncontrolled diabetes mellitus.  Goal is an A1c less than 8% before she can be cleared for surgery.

## 2019-06-23 LAB — LIPID PANEL
Cholesterol: 117 mg/dL (ref <200)
HDL: 32 mg/dL — ABNORMAL LOW (ref >=50)
LDL Cholesterol (Calc): 64 mg/dL (calc)
Non-HDL Cholesterol (Calc): 85 mg/dL (calc) (ref <130)
Total CHOL/HDL Ratio: 3.7 (calc) (ref <5.0)
Triglycerides: 125 mg/dL (ref <150)

## 2019-06-23 LAB — BASIC METABOLIC PANEL
BUN: 18 mg/dL (ref 7–25)
CO2: 27 mmol/L (ref 20–32)
Calcium: 9.5 mg/dL (ref 8.6–10.4)
Chloride: 105 mmol/L (ref 98–110)
Creat: 0.83 mg/dL (ref 0.50–1.05)
Glucose, Bld: 123 mg/dL — ABNORMAL HIGH (ref 65–99)
Potassium: 4.5 mmol/L (ref 3.5–5.3)
Sodium: 141 mmol/L (ref 135–146)

## 2019-06-30 ENCOUNTER — Other Ambulatory Visit: Payer: Self-pay | Admitting: Family Medicine

## 2019-07-02 ENCOUNTER — Encounter (HOSPITAL_COMMUNITY): Payer: Self-pay | Admitting: Psychiatry

## 2019-07-02 ENCOUNTER — Ambulatory Visit (HOSPITAL_COMMUNITY): Payer: Medicare HMO | Admitting: Psychiatry

## 2019-07-02 ENCOUNTER — Other Ambulatory Visit: Payer: Self-pay

## 2019-07-02 ENCOUNTER — Other Ambulatory Visit (HOSPITAL_COMMUNITY): Payer: Self-pay | Admitting: Psychiatry

## 2019-07-02 DIAGNOSIS — F333 Major depressive disorder, recurrent, severe with psychotic symptoms: Secondary | ICD-10-CM

## 2019-07-02 DIAGNOSIS — F411 Generalized anxiety disorder: Secondary | ICD-10-CM

## 2019-07-02 DIAGNOSIS — G4701 Insomnia due to medical condition: Secondary | ICD-10-CM

## 2019-07-02 MED ORDER — LITHIUM CARBONATE ER 300 MG PO TBCR: EXTENDED_RELEASE_TABLET | ORAL | 2 refills | Status: DC

## 2019-07-02 MED ORDER — CLONIDINE HCL 0.2 MG PO TABS: 0.2000 mg | ORAL_TABLET | Freq: Two times a day (BID) | ORAL | 2 refills | Status: DC

## 2019-07-02 MED ORDER — MIRTAZAPINE 45 MG PO TABS: 45.0000 mg | ORAL_TABLET | Freq: Every day | ORAL | 2 refills | Status: DC

## 2019-07-02 MED ORDER — HYDROXYZINE HCL 50 MG PO TABS: 50.0000 mg | ORAL_TABLET | Freq: Three times a day (TID) | ORAL | 2 refills | Status: DC | PRN

## 2019-07-02 NOTE — Progress Notes (Unsigned)
  Virtual Visit via Telephone Note  I connected with Melody Jenkins  on 07/02/19 at  8:30 AM EST by telephone and verified that I am speaking with the correct person using two identifiers.  Location: Patient: home Provider: office   I discussed the limitations, risks, security and privacy concerns of performing an evaluation and management service by telephone and the availability of in person appointments. I also discussed with the patient that there may be a patient responsible charge related to this service. The patient expressed understanding and agreed to proceed.   History of Present Illness: ***    Observations/Objective: I spoke with Melody Jenkins on the phone.  Pt was calm, pleasant and cooperative.  Pt was engaged in the conversation and answered questions appropriately.  Speech was clear and coherent with normal rate, tone and volume.  Mood is ***depressed and anxious, affect is congruent. Thought processes are coherent, goal oriented and intact.  Thought content is with ruminations***.  Pt denies SI/HI.   Pt denies auditory and visual hallucinations and did not appear to be responding to internal stimuli.  Memory and concentration are good.  Fund of knowledge and use of language are average.  Insight and judgment are fair.  I am unable to comment on psychomotor activity, general appearance, hygiene, or eye contact as I was unable to physically see the patient on the phone.  Vital signs not available since interview conducted virtually.     Assessment and Plan: ***  Status of current symptoms: ***    Follow Up Instructions: In 8 weeks or sooner if needed   I discussed the assessment and treatment plan with the patient. The patient was provided an opportunity to ask questions and all were answered. The patient agreed with the plan and demonstrated an understanding of the instructions.   The patient was advised to call back or seek an in-person evaluation if the  symptoms worsen or if the condition fails to improve as anticipated.  I provided 40 minutes of non-face-to-face time during this encounter.   Charlcie Cradle, MD '

## 2019-07-12 ENCOUNTER — Encounter: Payer: Self-pay | Admitting: Family Medicine

## 2019-07-16 ENCOUNTER — Telehealth (HOSPITAL_COMMUNITY): Payer: Self-pay | Admitting: *Deleted

## 2019-07-16 NOTE — Telephone Encounter (Addendum)
[facility] called requesting refill on the following medications: Lithium, Remeron,  and Vistaril. Please review. Pt has an appointment on 08/20/19.

## 2019-07-16 NOTE — Telephone Encounter (Signed)
I sent refills for all these meds on 1/21?

## 2019-07-17 ENCOUNTER — Other Ambulatory Visit (HOSPITAL_COMMUNITY): Payer: Self-pay | Admitting: *Deleted

## 2019-07-17 DIAGNOSIS — F333 Major depressive disorder, recurrent, severe with psychotic symptoms: Secondary | ICD-10-CM

## 2019-07-17 NOTE — Telephone Encounter (Signed)
Writer spoke to pt as [facility] called a second time requesting refills on Vistaril, Remeron, and Lithium. Pt stated that she is switching to [facility] but does have enough medication for February. [facility] is already on pt list of preferred pharmacies.

## 2019-07-22 ENCOUNTER — Other Ambulatory Visit: Payer: Self-pay | Admitting: *Deleted

## 2019-07-22 MED ORDER — METOPROLOL SUCCINATE ER 100 MG PO TB24: 100.0000 mg | ORAL_TABLET | Freq: Every day | ORAL | 1 refills | Status: DC

## 2019-07-22 MED ORDER — ROSUVASTATIN CALCIUM 10 MG PO TABS: 10.0000 mg | ORAL_TABLET | Freq: Every day | ORAL | 1 refills | Status: DC

## 2019-07-22 MED ORDER — METFORMIN HCL 1000 MG PO TABS: 1000.0000 mg | ORAL_TABLET | Freq: Two times a day (BID) | ORAL | 1 refills | Status: DC

## 2019-07-23 ENCOUNTER — Telehealth: Payer: Self-pay | Admitting: Gastroenterology

## 2019-07-23 NOTE — Telephone Encounter (Signed)
Okay. We can give her some Trulance samples to see if that helps and if so, may be much cheaper for her, or could also consider a trial of Amitiza. We have samples of both if she wants to try them both and let us know which she would prefer. Thanks

## 2019-07-23 NOTE — Telephone Encounter (Signed)
Please advise 

## 2019-07-24 NOTE — Telephone Encounter (Signed)
Spoke with patient and told her I would leave some Amitiza samples up front for her to try.  If they work she can call the office and we will send in a prescription.  If they don't or are also cost prohibitive we will move on to Trulance.  Patient agreed.

## 2019-08-10 ENCOUNTER — Other Ambulatory Visit: Payer: Self-pay | Admitting: *Deleted

## 2019-08-20 ENCOUNTER — Other Ambulatory Visit: Payer: Self-pay

## 2019-08-20 ENCOUNTER — Ambulatory Visit (HOSPITAL_COMMUNITY): Payer: Medicare HMO | Admitting: Psychiatry

## 2019-08-21 ENCOUNTER — Ambulatory Visit: Payer: Medicare HMO | Admitting: Family Medicine

## 2019-08-24 ENCOUNTER — Ambulatory Visit: Payer: Medicare HMO | Admitting: Orthopaedic Surgery

## 2019-08-25 ENCOUNTER — Encounter: Payer: Self-pay | Admitting: Family Medicine

## 2019-08-25 ENCOUNTER — Other Ambulatory Visit: Payer: Self-pay

## 2019-08-25 ENCOUNTER — Ambulatory Visit (INDEPENDENT_AMBULATORY_CARE_PROVIDER_SITE_OTHER): Payer: Medicare HMO | Admitting: Family Medicine

## 2019-08-25 VITALS — BP 128/74 | HR 82 | Temp 98.2°F | Resp 14 | Ht 67.0 in | Wt 227.0 lb

## 2019-08-25 DIAGNOSIS — E785 Hyperlipidemia, unspecified: Secondary | ICD-10-CM

## 2019-08-25 DIAGNOSIS — I10 Essential (primary) hypertension: Secondary | ICD-10-CM

## 2019-08-25 DIAGNOSIS — Z6835 Body mass index (BMI) 35.0-35.9, adult: Secondary | ICD-10-CM | POA: Diagnosis not present

## 2019-08-25 DIAGNOSIS — E782 Mixed hyperlipidemia: Secondary | ICD-10-CM | POA: Diagnosis not present

## 2019-08-25 DIAGNOSIS — E1169 Type 2 diabetes mellitus with other specified complication: Secondary | ICD-10-CM

## 2019-08-25 NOTE — Patient Instructions (Addendum)
F/U 4 months for Physical  

## 2019-08-25 NOTE — Assessment & Plan Note (Signed)
Lipids at goal on statin drug no changes to Crestor.

## 2019-08-25 NOTE — Assessment & Plan Note (Signed)
She is losing weight with improvement in her diabetes and on GLP-1 therapy.

## 2019-08-25 NOTE — Progress Notes (Signed)
   Subjective:    Patient ID: Melody Jenkins, female    DOB: 09-Sep-1965, 54 y.o.   MRN: [id number]  Patient presents for Follow-up (is fasting)  Here to follow-up diabetes mellitus.  Currently taking Metformin 1000mg  once a day and Trulicity [id number] was [id number] in December she is due for repeat labs today. States she has had a low sugar in th e 80's This AM fasting was  99 , CBG the past month  90-120 She has cut down on Soda , bread and the carbs to help control your her sugar Goal is to ger her Right shoulder surgery   Hyperlipidemia LDL was at goal in January at 64 triglycerides 125 she is currently on Crestor 10 mg   Hypertension she is currently taking her blood pressure medicines as prescribed including metoprolol 100 mg daily, clonidine and Lasix for peripheral edema  review Of Systems:  GEN- denies fatigue, fever, weight loss,weakness, recent illness HEENT- denies eye drainage, change in vision, nasal discharge, CVS- denies chest pain, palpitations RESP- denies SOB, cough, wheeze ABD- denies N/V, change in stools, abd pain GU- denies dysuria, hematuria, dribbling, incontinence MSK- denies joint pain, muscle aches, injury Neuro- denies headache, dizziness, syncope, seizure activity       Objective:    BP 128/74   Pulse 82   Temp 98.2 F (36.8 C) (Temporal)   Resp 14   Ht 5\' 7"  (1.702 m)   Wt 227 lb (103 kg)   LMP 12/17/2017   SpO2 98%   BMI 35.55 kg/m  GEN- NAD, alert and oriented x3 HEENT- PERRL, EOMI, non injected sclera, pink conjunctiva, MMM, oropharynx clear Neck- Supple, no thyromegaly CVS- RRR, no murmur RESP-CTAB ABD-NABS,soft,NT,ND EXT- No edema Pulses- Radial, DP- 2+        Assessment & Plan:      Problem List Items Addressed This Visit      Unprioritized   Essential hypertension, benign - Primary    Blood pressure is controlled no change in medication.      Relevant Orders   Comprehensive metabolic panel   CBC with  Differential/Platelet   Hyperlipidemia    Lipids at goal on statin drug no changes to Crestor.      Obesity    She is losing weight with improvement in her diabetes and on GLP-1 therapy.      Type 2 diabetes mellitus with hyperlipidemia ([facility])    Her fasting blood sugars have improved significantly.  She is actually had a couple of low sugars.  She will continue Metformin at 1000 mg once a day and the Trulicity.  We will recheck her A1c today of less than 8% she is medically cleared for surgical intervention for her shoulder.      Relevant Orders   Hemoglobin A1c      Note: This dictation was prepared with Dragon dictation along with smaller phrase technology. Any transcriptional errors that result from this process are unintentional.

## 2019-08-25 NOTE — Assessment & Plan Note (Signed)
Blood pressure is controlled no change in medication. 

## 2019-08-25 NOTE — Assessment & Plan Note (Signed)
Her fasting blood sugars have improved significantly.  She is actually had a couple of low sugars.  She will continue Metformin at 1000 mg once a day and the Trulicity.  We will recheck her A1c today of less than 8% she is medically cleared for surgical intervention for her shoulder.

## 2019-08-26 LAB — CBC WITH DIFFERENTIAL/PLATELET
Absolute Monocytes: 757 cells/uL (ref 200–950)
Basophils Absolute: 61 cells/uL (ref 0–200)
Basophils Relative: 0.7 %
Eosinophils Absolute: 191 cells/uL (ref 15–500)
Eosinophils Relative: 2.2 %
HCT: 44.9 % (ref 35.0–45.0)
Hemoglobin: 14.1 g/dL (ref 11.7–15.5)
Lymphs Abs: 3863 cells/uL (ref 850–3900)
MCH: 26.7 pg — ABNORMAL LOW (ref 27.0–33.0)
MCHC: 31.4 g/dL — ABNORMAL LOW (ref 32.0–36.0)
MCV: 84.9 fL (ref 80.0–100.0)
MPV: 10.3 fL (ref 7.5–12.5)
Monocytes Relative: 8.7 %
Neutro Abs: 3828 cells/uL (ref 1500–7800)
Neutrophils Relative %: 44 %
Platelets: 254 10*3/uL (ref 140–400)
RBC: 5.29 10*6/uL — ABNORMAL HIGH (ref 3.80–5.10)
RDW: 14.3 % (ref 11.0–15.0)
Total Lymphocyte: 44.4 %
WBC: 8.7 10*3/uL (ref 3.8–10.8)

## 2019-08-26 LAB — COMPREHENSIVE METABOLIC PANEL
AG Ratio: 1.6 (calc) (ref 1.0–2.5)
ALT: 17 U/L (ref 6–29)
AST: 14 U/L (ref 10–35)
Albumin: 4 g/dL (ref 3.6–5.1)
Alkaline phosphatase (APISO): 74 U/L (ref 37–153)
BUN: 20 mg/dL (ref 7–25)
CO2: 28 mmol/L (ref 20–32)
Calcium: 9.4 mg/dL (ref 8.6–10.4)
Chloride: 106 mmol/L (ref 98–110)
Creat: 0.76 mg/dL (ref 0.50–1.05)
Globulin: 2.5 g/dL (calc) (ref 1.9–3.7)
Glucose, Bld: 101 mg/dL — ABNORMAL HIGH (ref 65–99)
Potassium: 4.4 mmol/L (ref 3.5–5.3)
Sodium: 143 mmol/L (ref 135–146)
Total Bilirubin: 0.3 mg/dL (ref 0.2–1.2)
Total Protein: 6.5 g/dL (ref 6.1–8.1)

## 2019-08-26 LAB — HEMOGLOBIN A1C
Hgb A1c MFr Bld: 6.2 % of total Hgb — ABNORMAL HIGH (ref <5.7)
Mean Plasma Glucose: 131 (calc)
eAG (mmol/L): 7.3 (calc)

## 2019-08-27 ENCOUNTER — Other Ambulatory Visit: Payer: Self-pay

## 2019-08-27 ENCOUNTER — Ambulatory Visit (INDEPENDENT_AMBULATORY_CARE_PROVIDER_SITE_OTHER): Payer: Medicare HMO | Admitting: Psychiatry

## 2019-08-27 DIAGNOSIS — F333 Major depressive disorder, recurrent, severe with psychotic symptoms: Secondary | ICD-10-CM

## 2019-08-27 DIAGNOSIS — G4701 Insomnia due to medical condition: Secondary | ICD-10-CM | POA: Diagnosis not present

## 2019-08-27 DIAGNOSIS — F411 Generalized anxiety disorder: Secondary | ICD-10-CM | POA: Diagnosis not present

## 2019-08-27 MED ORDER — MIRTAZAPINE 45 MG PO TABS: 45.0000 mg | ORAL_TABLET | Freq: Every day | ORAL | 2 refills | Status: DC

## 2019-08-27 MED ORDER — LITHIUM CARBONATE ER 300 MG PO TBCR: 600.0000 mg | EXTENDED_RELEASE_TABLET | Freq: Two times a day (BID) | ORAL | 2 refills | Status: DC

## 2019-08-27 MED ORDER — HYDROXYZINE HCL 50 MG PO TABS: 50.0000 mg | ORAL_TABLET | Freq: Three times a day (TID) | ORAL | 2 refills | Status: DC | PRN

## 2019-08-27 NOTE — Progress Notes (Signed)
Virtual Visit via Telephone Note  I connected with Melody Jenkins on 08/27/19 at  8:30 AM EDT by telephone and verified that I am speaking with the correct person using two identifiers.  Location: Patient: home Provider: office   I discussed the limitations, risks, security and privacy concerns of performing an evaluation and management service by telephone and the availability of in person appointments. I also discussed with the patient that there may be a patient responsible charge related to this service. The patient expressed understanding and agreed to proceed.   History of Present Illness: She had to miss several therapy appointments because she was paying out of pocket. 1 month ago her mother was hospitalized due to an allergic reaction to contrast dye. Her husband died due to the same. Her mom has recovered but the whole experience has been traumatizing. Melody Jenkins is now having severe anxiety and panic attacks. She is unable to stop thinking about it. Melody Jenkins is scared and agitated.  Melody Jenkins states the Vistaril works quickly to calm her. The effect lasts 1-2 hours. She is having a lot of pain and can no longer get pain injections due to elevated sugars. She is working with her pain doctor. Her sleep is poor. She is stressed and overwhelmed. Melody Jenkins doesn't know how to cope with all these life stressors. Melody Jenkins is depressed. Her friend calls her daily and Melody Jenkins has started sharing her trouble with her friend. She forces herself to get up and get dressed. 1-2x a week she will walk to the store. It makes her feel better. Melody Jenkins is terrified of catching COVID. She doesn't have much support and is lonely. She denies SI/HI.      Observations/Objective:  General Appearance: unable to assess  Eye Contact:  unable to assess  Speech:  Clear and Coherent and Normal Rate  Volume:  Normal  Mood:  Anxious and Depressed  Affect:  Congruent and Tearful  Thought Process:  Coherent and  Descriptions of Associations: Circumstantial  Orientation:  Full (Time, Place, and Person)  Thought Content:  Rumination  Suicidal Thoughts:  No  Homicidal Thoughts:  No  Memory:  Immediate;   Good  Judgement:  Good  Insight:  Good  Psychomotor Activity: unable to assess  Concentration:  Concentration: Good  Recall:  Good  Fund of Knowledge:  Good  Language:  Good  Akathisia:  unable to assess  Handed:  Right  AIMS (if indicated):     Assets:  Communication Skills Desire for Improvement Financial Resources/Insurance Housing Talents/Skills  ADL's:  unable to assess  Cognition:  WNL  Sleep:        I reviewed the information below on 08/27/2019 and have updated it Assessment and Plan:MDD-recurrent, severe without psychotic features; GAD PTSD; Insomnia   Status of current symptoms: ongoing depression   Meds: increase  Lithium ER 600mg  po BID   Vistaril 50mg  po TID prn anxiety   Remeron 45mg  po qHS for depression   Clonidine 0.2mg  po BID prn- prescribed by PCP   Pt is on a number of sedating meds and BP meds. I will monitor.    I encouraged pt to restart therapy. I believe that will be more beneficial then medication adjustments.she has agreed to schedule an appointment for next week    Follow Up Instructions: In 6-8 weeks or sooner if needed   I discussed the assessment and treatment plan with the patient. The patient was provided an opportunity to ask questions and all were answered. The  patient agreed with the plan and demonstrated an understanding of the instructions.   The patient was advised to call back or seek an in-person evaluation if the symptoms worsen or if the condition fails to improve as anticipated.  I provided 30 minutes of non-face-to-face time during this encounter.   Charlcie Cradle, MD   Encouraged to use coping skills by going out to improve socialization.  Increase lithium 600mg  BID - [facility]

## 2019-08-31 ENCOUNTER — Other Ambulatory Visit: Payer: Self-pay

## 2019-08-31 ENCOUNTER — Encounter: Payer: Self-pay | Admitting: Orthopaedic Surgery

## 2019-08-31 ENCOUNTER — Ambulatory Visit: Payer: Medicare HMO | Admitting: Orthopaedic Surgery

## 2019-08-31 VITALS — Ht 69.0 in | Wt 225.0 lb

## 2019-08-31 DIAGNOSIS — G8929 Other chronic pain: Secondary | ICD-10-CM

## 2019-08-31 DIAGNOSIS — M25511 Pain in right shoulder: Secondary | ICD-10-CM

## 2019-08-31 DIAGNOSIS — S46011D Strain of muscle(s) and tendon(s) of the rotator cuff of right shoulder, subsequent encounter: Secondary | ICD-10-CM | POA: Diagnosis not present

## 2019-08-31 DIAGNOSIS — S46012D Strain of muscle(s) and tendon(s) of the rotator cuff of left shoulder, subsequent encounter: Secondary | ICD-10-CM

## 2019-08-31 MED ORDER — ALBUTEROL SULFATE HFA 108 (90 BASE) MCG/ACT IN AERS: 2.0000 | INHALATION_SPRAY | RESPIRATORY_TRACT | 1 refills | Status: DC | PRN

## 2019-08-31 NOTE — Progress Notes (Signed)
The patient comes in today to continue to discuss her chronic right shoulder pain and known full-thickness tear of the rotator cuff.  This was confirmed with clinical exam and MRI.  We had to hold off on surgery because her hemoglobin A1c was too high 3 and 6 months ago.  Both of these levels were at 12.  She now reports better blood glucose control.  6 days ago her hemoglobin A1c was down to 6.2.  She has cut back on smoking significantly as well.  Her blood pressure is also under better control.  On examination of her right shoulder there is significant weakness and pain throughout the motion of her shoulder.  Again the MRI previously confirmed a complete tear with retraction of the infraspinatus tendon of the rotator cuff.  There is also loose bodies in the shoulder and significant fluid.  At this point we are recommending an arthroscopic intervention for her right shoulder given the MRI and clinical exam findings.  Also with her health status improving we are more comfortable with setting her up for this type of surgery.  I showed her a shoulder model and explained in detail what the surgery involves.  We had a long and thorough discussion about the risk and benefits of surgery as well as what to expect with her interoperative and postoperative course.  I encouraged her to continue to stop smoking and to keep her blood sugars under this good control that she has.  We will work on setting her up for surgery and then we will see her back at 1 week postoperative.  All question concerns were answered and addressed.

## 2019-09-17 ENCOUNTER — Encounter: Payer: Self-pay | Admitting: Family Medicine

## 2019-09-29 ENCOUNTER — Other Ambulatory Visit: Payer: Self-pay | Admitting: Family Medicine

## 2019-10-05 ENCOUNTER — Telehealth (HOSPITAL_COMMUNITY): Payer: Self-pay

## 2019-10-05 ENCOUNTER — Other Ambulatory Visit (HOSPITAL_COMMUNITY): Payer: Self-pay

## 2019-10-05 DIAGNOSIS — F411 Generalized anxiety disorder: Secondary | ICD-10-CM

## 2019-10-05 NOTE — Telephone Encounter (Signed)
Received rx refill request for clonidine. Last ordered by you 1/21 for 30 day supply x2 refill. Per last visit note, rx prescribed by PCP. Please advise.

## 2019-10-06 ENCOUNTER — Other Ambulatory Visit (HOSPITAL_COMMUNITY): Payer: Self-pay

## 2019-10-06 DIAGNOSIS — F411 Generalized anxiety disorder: Secondary | ICD-10-CM

## 2019-10-06 MED ORDER — CLONIDINE HCL 0.2 MG PO TABS: 0.2000 mg | ORAL_TABLET | Freq: Two times a day (BID) | ORAL | 0 refills | Status: DC

## 2019-10-08 NOTE — Telephone Encounter (Signed)
It needs to come from her PCP

## 2019-10-09 ENCOUNTER — Encounter: Payer: Self-pay | Admitting: Family Medicine

## 2019-10-10 HISTORY — PX: ROTATOR CUFF REPAIR: SHX139

## 2019-10-12 NOTE — Telephone Encounter (Signed)
  She has [employer] services, okay to request change in status forms to see if they can provide additional hours

## 2019-10-13 ENCOUNTER — Other Ambulatory Visit (HOSPITAL_COMMUNITY): Payer: Self-pay | Admitting: Psychiatry

## 2019-10-13 DIAGNOSIS — F411 Generalized anxiety disorder: Secondary | ICD-10-CM

## 2019-10-15 ENCOUNTER — Other Ambulatory Visit: Payer: Self-pay | Admitting: Orthopaedic Surgery

## 2019-10-15 ENCOUNTER — Encounter: Payer: Self-pay | Admitting: *Deleted

## 2019-10-15 DIAGNOSIS — M19011 Primary osteoarthritis, right shoulder: Secondary | ICD-10-CM | POA: Diagnosis not present

## 2019-10-15 DIAGNOSIS — M25411 Effusion, right shoulder: Secondary | ICD-10-CM | POA: Diagnosis not present

## 2019-10-15 DIAGNOSIS — G8918 Other acute postprocedural pain: Secondary | ICD-10-CM | POA: Diagnosis not present

## 2019-10-15 DIAGNOSIS — M75121 Complete rotator cuff tear or rupture of right shoulder, not specified as traumatic: Secondary | ICD-10-CM | POA: Diagnosis not present

## 2019-10-15 DIAGNOSIS — M65811 Other synovitis and tenosynovitis, right shoulder: Secondary | ICD-10-CM | POA: Diagnosis not present

## 2019-10-15 DIAGNOSIS — M7551 Bursitis of right shoulder: Secondary | ICD-10-CM | POA: Diagnosis not present

## 2019-10-15 MED ORDER — HYDROCODONE-ACETAMINOPHEN 7.5-325 MG PO TABS: 1.0000 | ORAL_TABLET | Freq: Four times a day (QID) | ORAL | 0 refills | Status: DC | PRN

## 2019-10-22 ENCOUNTER — Encounter: Payer: Self-pay | Admitting: Orthopaedic Surgery

## 2019-10-22 ENCOUNTER — Ambulatory Visit (INDEPENDENT_AMBULATORY_CARE_PROVIDER_SITE_OTHER): Payer: Medicare HMO | Admitting: Orthopaedic Surgery

## 2019-10-22 ENCOUNTER — Telehealth (INDEPENDENT_AMBULATORY_CARE_PROVIDER_SITE_OTHER): Payer: Medicare HMO | Admitting: Psychiatry

## 2019-10-22 ENCOUNTER — Other Ambulatory Visit: Payer: Self-pay

## 2019-10-22 ENCOUNTER — Encounter (HOSPITAL_COMMUNITY): Payer: Self-pay | Admitting: Psychiatry

## 2019-10-22 DIAGNOSIS — F333 Major depressive disorder, recurrent, severe with psychotic symptoms: Secondary | ICD-10-CM

## 2019-10-22 DIAGNOSIS — Z9889 Other specified postprocedural states: Secondary | ICD-10-CM | POA: Insufficient documentation

## 2019-10-22 DIAGNOSIS — G4701 Insomnia due to medical condition: Secondary | ICD-10-CM | POA: Diagnosis not present

## 2019-10-22 DIAGNOSIS — F411 Generalized anxiety disorder: Secondary | ICD-10-CM

## 2019-10-22 MED ORDER — HYDROXYZINE HCL 50 MG PO TABS: 50.0000 mg | ORAL_TABLET | Freq: Three times a day (TID) | ORAL | 2 refills | Status: DC | PRN

## 2019-10-22 MED ORDER — LITHIUM CARBONATE ER 300 MG PO TBCR: 600.0000 mg | EXTENDED_RELEASE_TABLET | Freq: Two times a day (BID) | ORAL | 2 refills | Status: DC

## 2019-10-22 MED ORDER — HYDROCODONE-ACETAMINOPHEN 7.5-325 MG PO TABS: 1.0000 | ORAL_TABLET | Freq: Four times a day (QID) | ORAL | 0 refills | Status: DC | PRN

## 2019-10-22 MED ORDER — MIRTAZAPINE 45 MG PO TABS: 45.0000 mg | ORAL_TABLET | Freq: Every day | ORAL | 2 refills | Status: DC

## 2019-10-22 NOTE — BH Specialist Note (Signed)
Virtual Visit via Telephone Note  I connected with [name] on 10/22/19 at  9:00 AM EDT by telephone and verified that I am speaking with the correct person using two identifiers.  Location: Patient: home Provider: office   I discussed the limitations, risks, security and privacy concerns of performing an evaluation and management service by telephone and the availability of in person appointments. I also discussed with the patient that there may be a patient responsible charge related to this service. The patient expressed understanding and agreed to proceed.   History of Present Illness: Ofa shares that she had surgery recently and her follow up is today. Tniya didn't get much support from her daughter or other family members and had to manage herself since surgery. The pain is overwhelming. Her mom is having surgery today due to heart problems. Emora is having a hard time dealing with her mom's health issues and results. She is feeling sad and over the weekend she broke down due to be overwhelmed by everything. Deshelia is feeling a little better over the last few days. She states that therapy is helping a lot. Her court date is next month and she is really hoping to get closure. Her anxiety remains high due to her health and other stressors. Her sleep is poor most nights due to pain and anxiety.  She denies SI/HI.    Observations/Objective:  General Appearance: unable to assess  Eye Contact:  unable to assess  Speech:  Clear and Coherent and Normal Rate  Volume:  Normal  Mood:  Anxious and Depressed  Affect:  Congruent  Thought Process:  Coherent and Descriptions of Associations: Circumstantial  Orientation:  Full (Time, Place, and Person)  Thought Content:  Rumination  Suicidal Thoughts:  No  Homicidal Thoughts:  No  Memory:  Immediate;   Good  Judgement:  Fair  Insight:  Good  Psychomotor Activity: unable to assess  Concentration:  Concentration: Fair  Recall:   [employer] of Knowledge:  Good  Language:  Good  Akathisia:  unable to assess  Handed:  Right  AIMS (if indicated):     Assets:  Communication Skills Desire for Improvement Financial Resources/Insurance Talents/Skills Transportation Vocational/Educational  ADL's:  unable to assess  Cognition:  WNL  Sleep:        I reviewed the information below on 10/22/2019 and have updated it.  Assessment and Plan:MDD-recurrent, severe without psychotic features; GAD PTSD; Insomnia   Status of current symptoms: depressed and anxious   Meds: Lithium ER 600mg  po BID   Vistaril 50mg  po TID prn anxiety   Remeron 45mg  po qHS for depression   Clonidine 0.2mg  po BID prn- prescribed by PCP   Pt is on a number of sedating meds and BP meds. I will monitor.    I encouraged pt to continue therapy.   Follow Up Instructions: In 6-8 weeks or sooner if needed   I discussed the assessment and treatment plan with the patient. The patient was provided an opportunity to ask questions and all were answered. The patient agreed with the plan and demonstrated an understanding of the instructions.   The patient was advised to call back or seek an in-person evaluation if the symptoms worsen or if the condition fails to improve as anticipated.  I provided 30 minutes of non-face-to-face time during this encounter.   Charlcie Cradle, MD

## 2019-10-22 NOTE — Progress Notes (Signed)
The patient comes in today at 1 week after a right shoulder arthroscopy with a double row rotator cuff repair.  This was quite an extensive tear we were able to repair most of the infraspinatus tendon and just partial of the supraspinatus tendon.  We were still able to perform this the double row technique arthroscopically.  She is in a pillow abduction sling.  On exam I did remove all the sutures in place Steri-Strips.  Her axillary nerve is functioning.  She has significant limitations in motion due to pain in her shoulder.  She is in chronic pain management as well.  I did go over the arthroscopy pictures and described the extent of the repair for her right shoulder.  It is essential we get her into physical therapy to work on range of motion of her shoulder and other modalities part of the therapist for rehabbing a rotator cuff tear this is extensive is hers.  There will be a slow for sure.  She understands this can take 5 to 6 months to get good recovery.  I did refill her hydrocodone.  All questions and concerns were answered and addressed.  We will work on setting up for outpatient physical therapy here at [facility] and then I will see her back myself in 4 weeks to see what progress she is making.

## 2019-10-23 ENCOUNTER — Other Ambulatory Visit: Payer: Self-pay

## 2019-10-23 DIAGNOSIS — Z9889 Other specified postprocedural states: Secondary | ICD-10-CM

## 2019-10-26 DIAGNOSIS — F329 Major depressive disorder, single episode, unspecified: Secondary | ICD-10-CM | POA: Diagnosis not present

## 2019-10-26 DIAGNOSIS — M5416 Radiculopathy, lumbar region: Secondary | ICD-10-CM | POA: Diagnosis not present

## 2019-10-26 DIAGNOSIS — F419 Anxiety disorder, unspecified: Secondary | ICD-10-CM | POA: Diagnosis not present

## 2019-10-26 DIAGNOSIS — M5137 Other intervertebral disc degeneration, lumbosacral region: Secondary | ICD-10-CM | POA: Diagnosis not present

## 2019-10-28 ENCOUNTER — Ambulatory Visit: Payer: Medicare HMO | Attending: Internal Medicine

## 2019-10-28 DIAGNOSIS — Z20822 Contact with and (suspected) exposure to covid-19: Secondary | ICD-10-CM | POA: Diagnosis not present

## 2019-10-29 LAB — NOVEL CORONAVIRUS, NAA: SARS-CoV-2, NAA: NOT DETECTED

## 2019-10-29 LAB — SARS-COV-2, NAA 2 DAY TAT

## 2019-10-30 ENCOUNTER — Ambulatory Visit: Payer: Medicare HMO | Attending: Orthopaedic Surgery | Admitting: Physical Therapy

## 2019-10-30 ENCOUNTER — Encounter: Payer: Self-pay | Admitting: Physical Therapy

## 2019-10-30 ENCOUNTER — Other Ambulatory Visit: Payer: Self-pay

## 2019-10-30 ENCOUNTER — Telehealth: Payer: Self-pay | Admitting: Orthopaedic Surgery

## 2019-10-30 DIAGNOSIS — M6281 Muscle weakness (generalized): Secondary | ICD-10-CM

## 2019-10-30 DIAGNOSIS — M25611 Stiffness of right shoulder, not elsewhere classified: Secondary | ICD-10-CM

## 2019-10-30 DIAGNOSIS — M25511 Pain in right shoulder: Secondary | ICD-10-CM | POA: Diagnosis not present

## 2019-10-30 NOTE — Telephone Encounter (Signed)
Insurance does not cover ice machines, so no prescription is needed.

## 2019-10-30 NOTE — Telephone Encounter (Signed)
Patient called.  She is requesting an rx for an ice machine.   Call back: [phone]

## 2019-10-30 NOTE — Telephone Encounter (Signed)
LMOM for patient of the below message from Dr. Blackman  

## 2019-10-30 NOTE — Patient Instructions (Signed)
Access Code: [email or web address] URL: [email or web address] Date: 10/30/2019 Prepared by: Jeral Pinch  Exercises Seated Finger Composite Flexion Extension - 4-5 x daily - 7 x weekly - 1 sets - 15 reps Wrist AROM Flexion Extension - 4-5 x daily - 7 x weekly - 1 sets - 15 reps Seated Elbow Flexion Extension AROM - 4-5 x daily - 7 x weekly - 1 sets - 15 reps Circular Shoulder Pendulum with Table Support - 4-5 x daily - 7 x weekly - 1 sets - 15 reps Flexion-Extension Shoulder Pendulum with Table Support - 4-5 x daily - 7 x weekly - 1 sets - 15 reps Horizontal Shoulder Pendulum with Table Support - 4-5 x daily - 7 x weekly - 1 sets - 15 reps Seated Upper Trapezius Stretch - 4-5 x daily - 7 x weekly - 2 sets - 1 reps - 10-20 hold Seated Scapular Retraction - 4-5 x daily - 7 x weekly - 1 sets - 15 reps

## 2019-10-30 NOTE — Therapy (Signed)
[place], [place], [address] Phone: [phone]   Fax:  [phone]  Physical Therapy Evaluation  Patient Details  Name: Melody Jenkins MRN: [id number] Date of Birth: 08/28/1965 Referring Provider (PT): Dr Jean Rosenthal   Encounter Date: 10/30/2019  PT End of Session - 10/30/19 0809    Visit Number  1    Number of Visits  12    Date for PT Re-Evaluation  12/11/19    Authorization Type  Human MCR    Authorization Time Period  auth sent in    PT Start Time  0809    PT Stop Time  [id number]    PT Time Calculation (min)  48 min    Activity Tolerance  Patient limited by pain    Behavior During Therapy  [facility] for tasks assessed/performed       Past Medical History:  Diagnosis Date  . Anemia   . Anxiety   . Asthma   . Carpal tunnel syndrome   . Chronic back pain   . Depression   . History of multiple miscarriages   . Hyperlipidemia   . Torn rotator cuff     Past Surgical History:  Procedure Laterality Date  . BREAST SURGERY  2000   reduction  . CARPAL TUNNEL RELEASE     left  . CESAREAN SECTION    . COSMETIC SURGERY  2000   breast reduction  . REDUCTION MAMMAPLASTY Bilateral   . ROTATOR CUFF REPAIR  10/2019    There were no vitals filed for this visit.   Subjective Assessment - 10/30/19 0809    Subjective  Pt reports she slipped in 2017 and had multiple injuries - back, knee and shoulder.  She uses a cane because of her back issues.  She had elective Rt RTC repair on 10/15/2019    Diagnostic tests  MRI and xrays    Patient Stated Goals  use her arm again    Currently in Pain?  Yes    Pain Score  10-Worst pain ever    Pain Location  Shoulder    Pain Orientation  Right    Pain Descriptors / Indicators  Aching;Sharp    Pain Type  Surgical pain    Pain Onset  More than a month ago    Pain Frequency  Constant    Aggravating Factors   movement    Pain Relieving Factors  ice and good  support         [facility] PT Assessment - 10/30/19 0001      Assessment   Medical Diagnosis  Rt RTC repair    Referring Provider (PT)  Dr Jean Rosenthal    Onset Date/Surgical Date  10/15/19    Hand Dominance  Left    Next MD Visit  6/10/201    Prior Therapy  not for this       Precautions   Precautions  Fall    Precaution Comments  no heavy lifting because of back issues      Balance Screen   Has the patient fallen in the past 6 months  No    Has the patient had a decrease in activity level because of a fear of falling?   Yes   d/t back - uses cane and/or cane   Is the patient reluctant to leave their home because of a fear of falling?   Yes      Prior Function   Level of Independence  Needs assistance with ADLs;Needs assistance with homemaking   has an aide 3 hrsday   Vocation  On disability    Leisure  reading       Observation/Other Assessments   Focus on Therapeutic Outcomes (FOTO)   74% limited      Posture/Postural Control   Posture/Postural Control  Postural limitations    Postural Limitations  Rounded Shoulders;Forward head   obesity     ROM / Strength   AROM / PROM / Strength  AROM;PROM      AROM   AROM Assessment Site  Elbow;Wrist;Forearm;Shoulder;Cervical    Right/Left Shoulder  --   Lt limited with elevation - has partial tear here.    Right/Left Elbow  --   WNL   Right/Left Forearm  --   WNL   Right/Left Wrist  --   WNL except Rt frist flex ~ 50 degrees   Cervical Flexion  WNL    Cervical Extension  WNL    Cervical - Right Rotation  WNL    Cervical - Left Rotation  WNL with some tightness      PROM   PROM Assessment Site  Shoulder    Right/Left Shoulder  Right   measured supine   Right Shoulder Flexion  80 Degrees    Right Shoulder ABduction  38 Degrees    Right Shoulder Internal Rotation  80 Degrees    Right Shoulder External Rotation  12 Degrees                  Objective measurements completed on examination: See above  findings.      [facility] Adult PT Treatment/Exercise - 10/30/19 0001      Exercises   Exercises  Other Exercises    Other Exercises   performed HEP per handout      Modalities   Modalities  Vasopneumatic      Vasopneumatic   Number Minutes Vasopneumatic   15 minutes    Vasopnuematic Location   Shoulder   rt   Vasopneumatic Pressure  Low    Vasopneumatic Temperature   34*             PT Education - 10/30/19 0845    Education Details  HEP, POC    Person(s) Educated  Patient    Methods  Explanation;Demonstration;Handout    Comprehension  Returned demonstration;Verbalized understanding       PT Short Term Goals - 10/30/19 0846      PT SHORT TERM GOAL #1   Title  I with initial HEP    Time  3    Period  Weeks    Status  New    Target Date  11/20/19      PT SHORT TERM GOAL #2   Title  review FOTO results and expectaions    Time  3    Period  Weeks    Status  New    Target Date  11/20/19      PT SHORT TERM GOAL #3   Title  tolerate AAROM Rt shoulder    Time  3    Period  Weeks    Status  New    Target Date  11/20/19      PT SHORT TERM GOAL #4   Title  PROM Rt shoulder within protocol    Time  3    Period  Weeks    Status  New    Target Date  11/20/19  PT Long Term Goals - 10/30/19 0853      PT LONG TERM GOAL #1   Title  I with advanced HEP for shoulder    Time  6    Period  Weeks    Status  New    Target Date  12/11/19      PT LONG TERM GOAL #2   Title  improve FOTO =/< 48% limited    Time  6    Period  Weeks    Status  New    Target Date  12/11/19      PT LONG TERM GOAL #3   Title  demo Rt shoulder AROM WFL to allow her to use the arm for ADLs    Time  6    Period  Weeks    Status  New    Target Date  12/11/19      PT LONG TERM GOAL #4   Title  demo Rt UE strength =/> 4+/5 to use for light house work    Time  6    Period  Weeks    Status  New    Target Date  12/11/19      PT LONG TERM GOAL #5   Title  report =/> 75%  reduction in Rt shoulder pain    Time  6    Period  Weeks    Status  New    Target Date  12/11/19             Plan - 10/30/19 0908    Clinical Impression Statement  54 yo with multiple medical dx presents s/p Rt RTC repair on 10/15/2019.  She is wearing her sling per protocol.  She has limited shoulder ROM, strength and pain.  She has full cervical and lower arm motion however it is slow and causes some shoulder discomfort.  She will benefit from PT to maximize Rt shoulder function per protocol for RTC repair.    Personal Factors and Comorbidities  Comorbidity 3+    Comorbidities  back pain, knee meniscus surgery , partial tear Lt RTC, anxiety/depression, asthma, carpal tunnel    Examination-Activity Limitations  Bathing;Dressing;Hygiene/Grooming;Toileting;Lift    Examination-Participation Restrictions  Cleaning;Meal Prep;Other    Stability/Clinical Decision Making  Stable/Uncomplicated    Clinical Decision Making  Low    Rehab Potential  Good    PT Frequency  2x / week    PT Duration  6 weeks    PT Treatment/Interventions  Iontophoresis 4mg /ml Dexamethasone;Taping;Vasopneumatic Device;Patient/family education;Moist Heat;Ultrasound;Therapeutic activities;Passive range of motion;Therapeutic exercise;Cryotherapy;Electrical Stimulation;Manual techniques;Dry needling    PT Next Visit Plan  PROM Rt shoulder, scap stabilitiy - protocol for RTC repair, modalities PRN for pain    Consulted and Agree with Plan of Care  Patient       Patient will benefit from skilled therapeutic intervention in order to improve the following deficits and impairments:  Decreased range of motion, Obesity, Impaired UE functional use, Increased muscle spasms, Pain, Decreased strength, Increased edema  Visit Diagnosis: Stiffness of right shoulder, not elsewhere classified - Plan: PT plan of care cert/re-cert  Acute pain of right shoulder - Plan: PT plan of care cert/re-cert  Muscle weakness (generalized) - Plan:  PT plan of care cert/re-cert     Problem List Patient Active Problem List   Diagnosis Date Noted  . Status post right rotator cuff repair 10/22/2019  . Nontraumatic complete tear of right rotator cuff 04/29/2019  . 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 Trigger index finger of  right hand 11/20/2018  . Arthritis of carpometacarpal [facility]) joint of left thumb 11/20/2018  . Vitamin D deficiency 11/19/2018  . Chronic back pain   . Special screening for malignant neoplasms, colon 08/16/2017  . Type 2 diabetes mellitus with hyperlipidemia ([place]) 02/13/2017  . Leg swelling 02/13/2017  . Chronic constipation 11/20/2016  . Tear of MCL (medial collateral ligament) of knee, left, subsequent encounter 04/06/2016  . Anterolisthesis 04/06/2016  . Situational mixed anxiety and depressive disorder 02/26/2016  . Insomnia 02/24/2016  . Back pain with radiation 02/14/2016  . Essential hypertension, benign 12/26/2015  . DDD (degenerative disc disease), lumbar 12/26/2015  . Seasonal allergies 12/26/2015  . Obesity 04/06/2015  . Anemia   . Asthma   . Depression   . Hyperlipidemia     Jeral Pinch PT  10/30/2019, 9:15 AM  [facility] [address] [place], [place], [address] Phone: [phone]   Fax:  [phone]  Name: Melody Jenkins MRN: [id number] Date of Birth: [birth year]

## 2019-11-03 ENCOUNTER — Encounter: Payer: Self-pay | Admitting: Physical Therapy

## 2019-11-03 ENCOUNTER — Encounter: Payer: Self-pay | Admitting: Orthopaedic Surgery

## 2019-11-03 ENCOUNTER — Other Ambulatory Visit: Payer: Self-pay

## 2019-11-03 ENCOUNTER — Ambulatory Visit: Payer: Medicare HMO | Admitting: Physical Therapy

## 2019-11-03 DIAGNOSIS — M6281 Muscle weakness (generalized): Secondary | ICD-10-CM

## 2019-11-03 DIAGNOSIS — M25611 Stiffness of right shoulder, not elsewhere classified: Secondary | ICD-10-CM | POA: Diagnosis not present

## 2019-11-03 DIAGNOSIS — M25511 Pain in right shoulder: Secondary | ICD-10-CM

## 2019-11-03 NOTE — Therapy (Signed)
[place], [place], [address] Phone: [phone]   Fax:  [phone]  Physical Therapy Treatment  Patient Details  Name: Melody Jenkins MRN: [id number] Date of Birth: 05/16/1966 Referring Provider (PT): Dr Jean Rosenthal   Encounter Date: 11/03/2019  PT End of Session - 11/03/19 1153    Visit Number  2    Number of Visits  12    Date for PT Re-Evaluation  12/11/19    Authorization Type  Human MCR    PT Start Time  1147    PT Stop Time  1228    PT Time Calculation (min)  41 min       Past Medical History:  Diagnosis Date  . Anemia   . Anxiety   . Asthma   . Carpal tunnel syndrome   . Chronic back pain   . Depression   . History of multiple miscarriages   . Hyperlipidemia   . Torn rotator cuff     Past Surgical History:  Procedure Laterality Date  . BREAST SURGERY  2000   reduction  . CARPAL TUNNEL RELEASE     left  . CESAREAN SECTION    . COSMETIC SURGERY  2000   breast reduction  . REDUCTION MAMMAPLASTY Bilateral   . ROTATOR CUFF REPAIR  10/2019    There were no vitals filed for this visit.  Subjective Assessment - 11/03/19 1152    Currently in Pain?  Yes    Pain Score  8     Pain Location  Shoulder    Pain Orientation  Right    Pain Descriptors / Indicators  Aching;Sharp    Pain Type  Surgical pain    Aggravating Factors   movement    Pain Relieving Factors  meds, ice                        [facility] Adult PT Treatment/Exercise - 11/03/19 0001      Exercises   Other Exercises   Shoulder rolls, elbow flexion, upper trap stretch , levator stretch       Modalities   Modalities  Cryotherapy;Teacher, English as a foreign language Location  right shoulder    Electrical Stimulation Action  IFCx 15 min    Electrical Stimulation Parameters  10 mA    Electrical Stimulation Goals  Pain      Manual Therapy   Manual  therapy comments  PROM flexion to 90, abduction to 80, ER to 30   max verbal cues for relaxation throughout               PT Short Term Goals - 10/30/19 0846      PT SHORT TERM GOAL #1   Title  I with initial HEP    Time  3    Period  Weeks    Status  New    Target Date  11/20/19      PT SHORT TERM GOAL #2   Title  review FOTO results and expectaions    Time  3    Period  Weeks    Status  New    Target Date  11/20/19      PT SHORT TERM GOAL #3   Title  tolerate AAROM Rt shoulder    Time  3    Period  Weeks    Status  New    Target Date  11/20/19      PT SHORT TERM GOAL #4   Title  PROM Rt shoulder within protocol    Time  3    Period  Weeks    Status  New    Target Date  11/20/19        PT Long Term Goals - 10/30/19 0853      PT LONG TERM GOAL #1   Title  I with advanced HEP for shoulder    Time  6    Period  Weeks    Status  New    Target Date  12/11/19      PT LONG TERM GOAL #2   Title  improve FOTO =/< 48% limited    Time  6    Period  Weeks    Status  New    Target Date  12/11/19      PT LONG TERM GOAL #3   Title  demo Rt shoulder AROM WFL to allow her to use the arm for ADLs    Time  6    Period  Weeks    Status  New    Target Date  12/11/19      PT LONG TERM GOAL #4   Title  demo Rt UE strength =/> 4+/5 to use for light house work    Time  6    Period  Weeks    Status  New    Target Date  12/11/19      PT LONG TERM GOAL #5   Title  report =/> 75% reduction in Rt shoulder pain    Time  6    Period  Weeks    Status  New    Target Date  12/11/19            Plan - 11/03/19 1242    Clinical Impression Statement  Pt arrives with 8/10 pain. Drove herself so did not take pain meds, Reviewed HEP. She is very guarded and needs cues to relax shoulders.  Increased pain in shoulder and neck with right AROM elboe flexion in standing. No pain with this in supine. Verbal cues for relaxation were effective pt was able to tolerate  shoulder flexion to 90 and ER to 30 degrees. IFC used at end of session to reduce pain. She has a tens unit for her back and was instructed in how to place electrodes for her shoulder.    PT Next Visit Plan  PROM Rt shoulder, scap stabilitiy - protocol for RTC repair, modalities PRN for pain    PT Home Exercise Plan  [id number]: scap retract, pendulums, elbow flexion AROM, upper trap stretch, wrist flexion/extension       Patient will benefit from skilled therapeutic intervention in order to improve the following deficits and impairments:  Decreased range of motion, Obesity, Impaired UE functional use, Increased muscle spasms, Pain, Decreased strength, Increased edema  Visit Diagnosis: Stiffness of right shoulder, not elsewhere classified  Acute pain of right shoulder  Muscle weakness (generalized)     Problem List Patient Active Problem List   Diagnosis Date Noted  . Status post right rotator cuff repair 10/22/2019  . Nontraumatic complete tear of right rotator cuff 04/29/2019  . 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 Trigger index finger of right hand 11/20/2018  . Arthritis of carpometacarpal [facility]) joint of left thumb 11/20/2018  . Vitamin D deficiency 11/19/2018  . Chronic back pain   . Special screening for malignant neoplasms, colon 08/16/2017  . Type  2 diabetes mellitus with hyperlipidemia ([place]) 02/13/2017  . Leg swelling 02/13/2017  . Chronic constipation 11/20/2016  . Tear of MCL (medial collateral ligament) of knee, left, subsequent encounter 04/06/2016  . Anterolisthesis 04/06/2016  . Situational mixed anxiety and depressive disorder 02/26/2016  . Insomnia 02/24/2016  . Back pain with radiation 02/14/2016  . Essential hypertension, benign 12/26/2015  . DDD (degenerative disc disease), lumbar 12/26/2015  . Seasonal allergies 12/26/2015  . Obesity 04/06/2015  . Anemia   . Asthma   . Depression   . Hyperlipidemia     Dorene Ar, [place] 11/03/2019, 12:50 PM  [facility] [address] [place], [place], [address] Phone: [phone]   Fax:  [phone]  Name: Melody Jenkins MRN: [id number] Date of Birth: 08-02-1965

## 2019-11-04 NOTE — Telephone Encounter (Signed)
Not mine pls calla thx

## 2019-11-08 ENCOUNTER — Other Ambulatory Visit: Payer: Self-pay | Admitting: Family Medicine

## 2019-11-10 ENCOUNTER — Encounter: Payer: Self-pay | Admitting: Physical Therapy

## 2019-11-10 ENCOUNTER — Other Ambulatory Visit: Payer: Self-pay

## 2019-11-10 ENCOUNTER — Ambulatory Visit: Payer: Medicare HMO | Attending: Orthopaedic Surgery | Admitting: Physical Therapy

## 2019-11-10 DIAGNOSIS — M25611 Stiffness of right shoulder, not elsewhere classified: Secondary | ICD-10-CM | POA: Diagnosis not present

## 2019-11-10 DIAGNOSIS — M6281 Muscle weakness (generalized): Secondary | ICD-10-CM | POA: Insufficient documentation

## 2019-11-10 DIAGNOSIS — M25511 Pain in right shoulder: Secondary | ICD-10-CM | POA: Diagnosis not present

## 2019-11-10 DIAGNOSIS — M79642 Pain in left hand: Secondary | ICD-10-CM | POA: Diagnosis not present

## 2019-11-10 NOTE — Therapy (Signed)
[place], [place], [address] Phone: [phone]   Fax:  [phone]  Physical Therapy Treatment  Patient Details  Name: Melody Jenkins MRN: [id number] Date of Birth: 03-Aug-1965 Referring Provider (PT): Dr Jean Rosenthal   Encounter Date: 11/10/2019  PT End of Session - 11/10/19 1337    Visit Number  3    Number of Visits  12    Date for PT Re-Evaluation  12/11/19    Authorization Type  Human MCR    PT Start Time  [id number]   pt arrived 7 min late   PT Stop Time  1421    PT Time Calculation (min)  44 min    Activity Tolerance  Patient limited by pain    Behavior During Therapy  [facility] for tasks assessed/performed       Past Medical History:  Diagnosis Date  . Anemia   . Anxiety   . Asthma   . Carpal tunnel syndrome   . Chronic back pain   . Depression   . History of multiple miscarriages   . Hyperlipidemia   . Torn rotator cuff     Past Surgical History:  Procedure Laterality Date  . BREAST SURGERY  2000   reduction  . CARPAL TUNNEL RELEASE     left  . CESAREAN SECTION    . COSMETIC SURGERY  2000   breast reduction  . REDUCTION MAMMAPLASTY Bilateral   . ROTATOR CUFF REPAIR  10/2019    There were no vitals filed for this visit.  Subjective Assessment - 11/10/19 1337    Subjective  "The shoulder is still givingme issues. I am trying to do the exercises but it just catches"    Currently in Pain?  Yes    Pain Score  9    pt declined going to the ED. last took pain meds at 12:30   Pain Orientation  Right    Pain Onset  More than a month ago    Pain Frequency  Intermittent    Aggravating Factors   movement or any activity.         [facility] PT Assessment - 11/10/19 0001      Assessment   Medical Diagnosis  Rt RTC repair    Referring Provider (PT)  Dr Jean Rosenthal                    [facility] Adult PT Treatment/Exercise - 11/10/19 0001      Exercises    Exercises  Shoulder      Shoulder Exercises: Pulleys   Flexion  3 minutes      Shoulder Exercises: Isometric Strengthening   Flexion  5X5";Supine    Extension  5X5";Supine    External Rotation  5X5";Supine    Internal Rotation  5X5";Supine      Vasopneumatic   Number Minutes Vasopneumatic   10 minutes    Vasopnuematic Location   Shoulder    Vasopneumatic Pressure  Medium    Vasopneumatic Temperature   34      Manual Therapy   Manual Therapy  Joint mobilization    Manual therapy comments  flexion/ abduction with oscillation to reduce pain    Joint Mobilization  PA/ AP grade I for pain with grade 1 distraction             PT Education - 11/10/19 1416    Education Details  discussed with pt tha tshe could use a ace wrap  with an ice pack for combined with an ace wrap. pain eduction that pain doesn't equal damage.    Person(s) Educated  Patient    Methods  Explanation;Verbal cues;Handout    Comprehension  Verbalized understanding;Verbal cues required       PT Short Term Goals - 10/30/19 0846      PT SHORT TERM GOAL #1   Title  I with initial HEP    Time  3    Period  Weeks    Status  New    Target Date  11/20/19      PT SHORT TERM GOAL #2   Title  review FOTO results and expectaions    Time  3    Period  Weeks    Status  New    Target Date  11/20/19      PT SHORT TERM GOAL #3   Title  tolerate AAROM Rt shoulder    Time  3    Period  Weeks    Status  New    Target Date  11/20/19      PT SHORT TERM GOAL #4   Title  PROM Rt shoulder within protocol    Time  3    Period  Weeks    Status  New    Target Date  11/20/19        PT Long Term Goals - 10/30/19 0853      PT LONG TERM GOAL #1   Title  I with advanced HEP for shoulder    Time  6    Period  Weeks    Status  New    Target Date  12/11/19      PT LONG TERM GOAL #2   Title  improve FOTO =/< 48% limited    Time  6    Period  Weeks    Status  New    Target Date  12/11/19      PT LONG TERM  GOAL #3   Title  demo Rt shoulder AROM WFL to allow her to use the arm for ADLs    Time  6    Period  Weeks    Status  New    Target Date  12/11/19      PT LONG TERM GOAL #4   Title  demo Rt UE strength =/> 4+/5 to use for light house work    Time  6    Period  Weeks    Status  New    Target Date  12/11/19      PT LONG TERM GOAL #5   Title  report =/> 75% reduction in Rt shoulder pain    Time  6    Period  Weeks    Status  New    Target Date  12/11/19            Plan - 11/10/19 1415    Clinical Impression Statement  pt is 3 1/2 weeks post op today and reports conitnued pain at 9-10/10 today and declined trip to ED due to severity if pain. continued working on shoulder PROM working into end range of available ROM. began working gentle isometrics in supine which she did well with. utilzing redirection of focus and pain education intermittently throughout session. utilized vaso end of session to promote pain relief.       Patient will benefit from skilled therapeutic intervention in order to improve the following deficits and impairments:     Visit Diagnosis: Stiffness  of right shoulder, not elsewhere classified  Acute pain of right shoulder  Muscle weakness (generalized)     Problem List Patient Active Problem List   Diagnosis Date Noted  . Status post right rotator cuff repair 10/22/2019  . Nontraumatic complete tear of right rotator cuff 04/29/2019  . 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 Trigger index finger of right hand 11/20/2018  . Arthritis of carpometacarpal [facility]) joint of left thumb 11/20/2018  . Vitamin D deficiency 11/19/2018  . Chronic back pain   . Special screening for malignant neoplasms, colon 08/16/2017  . Type 2 diabetes mellitus with hyperlipidemia ([place]) 02/13/2017  . Leg swelling 02/13/2017  . Chronic constipation 11/20/2016  . Tear of MCL (medial collateral ligament) of knee, left, subsequent encounter 04/06/2016  . Anterolisthesis 04/06/2016   . Situational mixed anxiety and depressive disorder 02/26/2016  . Insomnia 02/24/2016  . Back pain with radiation 02/14/2016  . Essential hypertension, benign 12/26/2015  . DDD (degenerative disc disease), lumbar 12/26/2015  . Seasonal allergies 12/26/2015  . Obesity 04/06/2015  . Anemia   . Asthma   . Depression   . Hyperlipidemia    Starr Lake PT, DPT, LAT, ATC  11/10/19  2:26 PM      [facility] [address] [place], [place], [address] Phone: [phone]   Fax:  [phone]  Name: Jaima Adornetto MRN: [id number] Date of Birth: 11-Mar-1966

## 2019-11-12 ENCOUNTER — Telehealth: Payer: Self-pay | Admitting: *Deleted

## 2019-11-12 NOTE — Telephone Encounter (Signed)
Received forms from patient for [facility].  States that [facility] currently pays for her supplemental life insurance D/T disability.   Reports that annual form is to be completed by PCP to continue coverage.   Form routed to provider.

## 2019-11-13 ENCOUNTER — Telehealth: Payer: Self-pay | Admitting: Physical Therapy

## 2019-11-13 ENCOUNTER — Ambulatory Visit: Payer: Medicare HMO | Admitting: Physical Therapy

## 2019-11-13 NOTE — Telephone Encounter (Signed)
Left message for pt regarding missing todays PT treatment,  Next appointment day and time left on message as well with request to call office if she is unable to attend per our attendance policy Jeral Pinch, PT 11/13/19 9:25 AM

## 2019-11-17 ENCOUNTER — Other Ambulatory Visit: Payer: Self-pay

## 2019-11-17 ENCOUNTER — Ambulatory Visit: Payer: Medicare HMO | Admitting: Physical Therapy

## 2019-11-17 ENCOUNTER — Encounter: Payer: Self-pay | Admitting: Physical Therapy

## 2019-11-17 DIAGNOSIS — M79642 Pain in left hand: Secondary | ICD-10-CM

## 2019-11-17 DIAGNOSIS — M25611 Stiffness of right shoulder, not elsewhere classified: Secondary | ICD-10-CM

## 2019-11-17 DIAGNOSIS — M6281 Muscle weakness (generalized): Secondary | ICD-10-CM | POA: Diagnosis not present

## 2019-11-17 DIAGNOSIS — M25511 Pain in right shoulder: Secondary | ICD-10-CM

## 2019-11-17 NOTE — Therapy (Signed)
[place], [place], [address] Phone: [phone]   Fax:  [phone]  Physical Therapy Treatment  Patient Details  Name: Melody Jenkins MRN: [id number] Date of Birth: Nov 02, 1965 Referring Provider (PT): Dr Jean Rosenthal   Encounter Date: 11/17/2019  PT End of Session - 11/17/19 1149    Visit Number  4    Number of Visits  12    Date for PT Re-Evaluation  12/11/19    Authorization Type  Human MCR    PT Start Time  0932    PT Stop Time  1030    PT Time Calculation (min)  58 min    Activity Tolerance  Patient limited by pain    Behavior During Therapy  [facility] for tasks assessed/performed       Past Medical History:  Diagnosis Date   Anemia    Anxiety    Asthma    Carpal tunnel syndrome    Chronic back pain    Depression    History of multiple miscarriages    Hyperlipidemia    Torn rotator cuff     Past Surgical History:  Procedure Laterality Date   BREAST SURGERY  2000   reduction   CARPAL TUNNEL RELEASE     left   CESAREAN SECTION     COSMETIC SURGERY  2000   breast reduction   REDUCTION MAMMAPLASTY Bilateral    ROTATOR CUFF REPAIR  10/2019    There were no vitals filed for this visit.  Subjective Assessment - 11/17/19 0942    Subjective  I am having trouble sleeping at night  My pain is 9/10 today. I cant get comfortable. I dont know how to use my slinge    Diagnostic tests  MRI and xrays    Patient Stated Goals  use her arm again    Pain Score  9     Pain Location  Shoulder    Pain Orientation  Right    Pain Descriptors / Indicators  Aching;Sore;Sharp    Pain Onset  More than a month ago    Pain Frequency  Intermittent                        [facility] Adult PT Treatment/Exercise - 11/17/19 0001      Self-Care   Self-Care  Other Self-Care Comments    Other Self-Care Comments   positioning for sleep and use of pillows and reinforcement of  posture      Exercises   Exercises  Shoulder      Shoulder Exercises: Standing   Other Standing Exercises  pendulum side to side , circles  CW and CCW /forward and backward to start and loosen up      Shoulder Exercises: Pulleys   Flexion  3 minutes      Shoulder Exercises: Isometric Strengthening   Flexion  5X5";Supine    Extension  5X5";Supine    External Rotation  5X5";Supine    Internal Rotation  5X5";Supine      Modalities   Modalities  Cryotherapy      Vasopneumatic   Number Minutes Vasopneumatic   10 minutes    Vasopnuematic Location   Shoulder    Vasopneumatic Pressure  Medium    Vasopneumatic Temperature   34      Manual Therapy   Manual Therapy  Joint mobilization;Soft tissue mobilization;Passive ROM    Manual therapy comments  flexion/ abduction with oscillation to reduce  pain    Joint Mobilization  PA/ AP grade I for pain with grade 1 distraction    Soft tissue mobilization  STW to Rt upper trap/levator and periscapular mx    Passive ROM  PROM of flex and abd to 90  elbow flex/ext end range stretch             PT Education - 11/17/19 1151    Education Details  discussed positioning of arm/shoulder for better sleep  pain control    Person(s) Educated  Patient    Methods  Explanation;Demonstration    Comprehension  Verbalized understanding;Returned demonstration       PT Short Term Goals - 10/30/19 0846      PT SHORT TERM GOAL #1   Title  I with initial HEP    Time  3    Period  Weeks    Status  New    Target Date  11/20/19      PT SHORT TERM GOAL #2   Title  review FOTO results and expectaions    Time  3    Period  Weeks    Status  New    Target Date  11/20/19      PT SHORT TERM GOAL #3   Title  tolerate AAROM Rt shoulder    Time  3    Period  Weeks    Status  New    Target Date  11/20/19      PT SHORT TERM GOAL #4   Title  PROM Rt shoulder within protocol    Time  3    Period  Weeks    Status  New    Target Date  11/20/19         PT Long Term Goals - 10/30/19 0853      PT LONG TERM GOAL #1   Title  I with advanced HEP for shoulder    Time  6    Period  Weeks    Status  New    Target Date  12/11/19      PT LONG TERM GOAL #2   Title  improve FOTO =/< 48% limited    Time  6    Period  Weeks    Status  New    Target Date  12/11/19      PT LONG TERM GOAL #3   Title  demo Rt shoulder AROM WFL to allow her to use the arm for ADLs    Time  6    Period  Weeks    Status  New    Target Date  12/11/19      PT LONG TERM GOAL #4   Title  demo Rt UE strength =/> 4+/5 to use for light house work    Time  6    Period  Weeks    Status  New    Target Date  12/11/19      PT LONG TERM GOAL #5   Title  report =/> 75% reduction in Rt shoulder pain    Time  6    Period  Weeks    Status  New    Target Date  12/11/19            Plan - 11/17/19 1143    Clinical Impression Statement  Pt enters clinic 4  and 1/2 weeks post op today and reports 9/10 pain.  Pt complained about not being able to sleep well and was educated on postioning and  use of pillows and toewels for comfort. Pt stated she just woke up and was very tired.  Pt was closely monitored and had CGA to assits on mat table and to walk. Continued working on shoulder PROM to end range available and isometrics in supine. Pt also performed pendulum and use of pulley initially to warm up.  Pt with spamsing upper trap on RT and responded well to STW. Vaso at end of session.  Pt especially responded to gentle mobs of shoulder in which pt stated she fellt her muscles release and felt less spasm after RX    Personal Factors and Comorbidities  Comorbidity 3+    Comorbidities  back pain, knee meniscus surgery , partial tear Lt RTC, anxiety/depression, asthma, carpal tunnel    Examination-Activity Limitations  Bathing;Dressing;Hygiene/Grooming;Toileting;Lift    Examination-Participation Restrictions  Cleaning;Meal Prep;Other    Rehab Potential  Good    PT  Frequency  2x / week    PT Duration  6 weeks    PT Treatment/Interventions  Iontophoresis 4mg /ml Dexamethasone;Taping;Vasopneumatic Device;Patient/family education;Moist Heat;Ultrasound;Therapeutic activities;Passive range of motion;Therapeutic exercise;Cryotherapy;Electrical Stimulation;Manual techniques;Dry needling    PT Next Visit Plan  PROM Rt shoulder, scap stabilitiy - protocol for RTC repair, modalities PRN for pain    PT Home Exercise Plan  [id number]: scap retract, pendulums, elbow flexion AROM, upper trap stretch, wrist flexion/extension    Consulted and Agree with Plan of Care  Patient       Patient will benefit from skilled therapeutic intervention in order to improve the following deficits and impairments:  Decreased range of motion, Obesity, Impaired UE functional use, Increased muscle spasms, Pain, Decreased strength, Increased edema  Visit Diagnosis: Stiffness of right shoulder, not elsewhere classified  Acute pain of right shoulder  Muscle weakness (generalized)  Pain in left hand     Problem List Patient Active Problem List   Diagnosis Date Noted   Status post right rotator cuff repair 10/22/2019   Nontraumatic complete tear of right rotator cuff 04/29/2019   Wrist arthritis 11/20/2018   Trigger index finger of right hand 11/20/2018   Arthritis of carpometacarpal (CMC) joint of left thumb 11/20/2018   Vitamin D deficiency 11/19/2018   Chronic back pain    Special screening for malignant neoplasms, colon 08/16/2017   Type 2 diabetes mellitus with hyperlipidemia ([place]) 02/13/2017   Leg swelling 02/13/2017   Chronic constipation 11/20/2016   Tear of MCL (medial collateral ligament) of knee, left, subsequent encounter 04/06/2016   Anterolisthesis 04/06/2016   Situational mixed anxiety and depressive disorder 02/26/2016   Insomnia 02/24/2016   Back pain with radiation 02/14/2016   Essential hypertension, benign 12/26/2015   DDD (degenerative  disc disease), lumbar 12/26/2015   Seasonal allergies 12/26/2015   Obesity 04/06/2015   Anemia    Asthma    Depression    Hyperlipidemia    Voncille Lo, PT Certified Exercise Expert for the Aging Adult  11/17/19 11:52 AM Phone: [phone] Fax: [facility] [address] [place], [place], [address] Phone: [phone]   Fax:  [phone]  Name: Timara Loma MRN: [id number] Date of Birth: [birth year]

## 2019-11-19 ENCOUNTER — Ambulatory Visit (INDEPENDENT_AMBULATORY_CARE_PROVIDER_SITE_OTHER): Payer: Medicare HMO | Admitting: Orthopaedic Surgery

## 2019-11-19 ENCOUNTER — Encounter (HOSPITAL_COMMUNITY): Payer: Self-pay | Admitting: Psychiatry

## 2019-11-19 ENCOUNTER — Other Ambulatory Visit: Payer: Self-pay

## 2019-11-19 ENCOUNTER — Telehealth (INDEPENDENT_AMBULATORY_CARE_PROVIDER_SITE_OTHER): Payer: Medicare HMO | Admitting: Psychiatry

## 2019-11-19 ENCOUNTER — Encounter: Payer: Self-pay | Admitting: Orthopaedic Surgery

## 2019-11-19 DIAGNOSIS — G4701 Insomnia due to medical condition: Secondary | ICD-10-CM

## 2019-11-19 DIAGNOSIS — F333 Major depressive disorder, recurrent, severe with psychotic symptoms: Secondary | ICD-10-CM | POA: Diagnosis not present

## 2019-11-19 DIAGNOSIS — F411 Generalized anxiety disorder: Secondary | ICD-10-CM

## 2019-11-19 DIAGNOSIS — Z9889 Other specified postprocedural states: Secondary | ICD-10-CM

## 2019-11-19 MED ORDER — LITHIUM CARBONATE ER 300 MG PO TBCR: 600.0000 mg | EXTENDED_RELEASE_TABLET | Freq: Two times a day (BID) | ORAL | 2 refills | Status: DC

## 2019-11-19 MED ORDER — HYDROXYZINE HCL 50 MG PO TABS: 50.0000 mg | ORAL_TABLET | Freq: Three times a day (TID) | ORAL | 2 refills | Status: DC | PRN

## 2019-11-19 MED ORDER — MIRTAZAPINE 45 MG PO TABS: 45.0000 mg | ORAL_TABLET | Freq: Every day | ORAL | 2 refills | Status: DC

## 2019-11-19 NOTE — Progress Notes (Signed)
Virtual Visit via Telephone Note  I connected with Melody Jenkins on 11/19/19 at  8:45 AM EDT by telephone and verified that I am speaking with the correct person using two identifiers.  Location: Patient: home Provider: office   I discussed the limitations, risks, security and privacy concerns of performing an evaluation and management service by telephone and the availability of in person appointments. I also discussed with the patient that there may be a patient responsible charge related to this service. The patient expressed understanding and agreed to proceed.   History of Present Illness: "I am doing a lot better". She had her rotator cuff surgery 3 weeks ago. She has started PT. The pain prior to the surgery was making it difficult to do anything. She will have her other should done in the future. She is taking it one day at time. Her mom is doing better and that is a huge stress reliever. Melody Jenkins has a number of other stressors that contribute to her anxiety. The anxiety comes and goes and is mostly related to her stressors. The Vistaril helps a lot when she is anxious.  Her stress tolerance is low. Melody Jenkins continues to have some depression. She feels lonely and her supports have decreased. Overall she has good and bad days. [name] denies SI/HI. Melody Jenkins is not sleeping well due to pain and recovering from surgery. Melody Jenkins has restarted therapy and it is helping.    Observations/Objective:  General Appearance: unable to assess  Eye Contact:  unable to assess  Speech:  Clear and Coherent and Normal Rate  Volume:  Normal  Mood:  Anxious and Depressed  Affect:  Congruent  Thought Process:  Coherent and Descriptions of Associations: Circumstantial  Orientation:  Full (Time, Place, and Person)  Thought Content:  Rumination  Suicidal Thoughts:  No  Homicidal Thoughts:  No  Memory:  Immediate;   Good  Judgement:  Fair  Insight:  Good  Psychomotor Activity: unable to assess   Concentration:  Concentration: Fair  Recall:  [employer] of Knowledge:  Good  Language:  Good  Akathisia:  unable to assess  Handed:  Right  AIMS (if indicated):     Assets:  Communication Skills Desire for Improvement Financial Resources/Insurance Housing Resilience Talents/Skills Transportation Vocational/Educational  ADL's:  unable to assess  Cognition:  WNL  Sleep:         Assessment and Plan: MDD-recurrent, severe without psychotic features; GAD; PTSD; Insomnia  Lithium 600mg  po BID  Vistaril 50mg  po TID prn anxiety  Remeron 45mg  po qHS for depression and anxiety  Clonidine 0.2mg  po BID prn- prescribed by PCP  Pt is on a number of sedating and BP meds.   I encouraged Dlynn to continue therapy- pt is finding significant benefit.   Follow Up Instructions: In 8-10 weeks or sooner if needed   I discussed the assessment and treatment plan with the patient. The patient was provided an opportunity to ask questions and all were answered. The patient agreed with the plan and demonstrated an understanding of the instructions.   The patient was advised to call back or seek an in-person evaluation if the symptoms worsen or if the condition fails to improve as anticipated.  I provided 25 minutes of non-face-to-face time during this encounter.   Charlcie Cradle, MD

## 2019-11-19 NOTE — Progress Notes (Signed)
The patient is now 5 weeks status post a right shoulder arthroscopy with rotator cuff repair.  She is making progress with therapy.  She still been wearing her sling.  She does report that she is getting there from her standpoint.  It is waking her up at night.  On exam her incisions of healed nicely on the right shoulder.  Her axillary nerve is functioning.  She is able to abduct her shoulder on her own.  I gave her encouragement that she is making good progress but she understands this is something they can take 5 to 6 months to improve from and recover.  From my standpoint, she can stop wearing her sling.  We will see her back in 4 weeks to see how she is doing overall clinically.

## 2019-11-20 ENCOUNTER — Ambulatory Visit: Payer: Medicare HMO | Admitting: Physical Therapy

## 2019-11-20 ENCOUNTER — Encounter: Payer: Self-pay | Admitting: Physical Therapy

## 2019-11-20 DIAGNOSIS — M25511 Pain in right shoulder: Secondary | ICD-10-CM

## 2019-11-20 DIAGNOSIS — M6281 Muscle weakness (generalized): Secondary | ICD-10-CM

## 2019-11-20 DIAGNOSIS — M25611 Stiffness of right shoulder, not elsewhere classified: Secondary | ICD-10-CM | POA: Diagnosis not present

## 2019-11-20 DIAGNOSIS — M79642 Pain in left hand: Secondary | ICD-10-CM | POA: Diagnosis not present

## 2019-11-20 NOTE — Therapy (Signed)
[place], [place], [address] Phone: [phone]   Fax:  [phone]  Physical Therapy Treatment  Patient Details  Name: Melody Jenkins MRN: [id number] Date of Birth: [birth year] Referring Provider (PT): Dr Jean Rosenthal   Encounter Date: 11/20/2019   PT End of Session - 11/20/19 0930    Visit Number 5    Number of Visits 12    Date for PT Re-Evaluation 12/11/19    Authorization Type Human MCR    Authorization Time Period auth sent in    PT Start Time [id number]    PT Stop Time 1030    PT Time Calculation (min) 44 min    Activity Tolerance Patient tolerated treatment well    Behavior During Therapy [facility] for tasks assessed/performed           Past Medical History:  Diagnosis Date  . Anemia   . Anxiety   . Asthma   . Carpal tunnel syndrome   . Chronic back pain   . Depression   . History of multiple miscarriages   . Hyperlipidemia   . Torn rotator cuff     Past Surgical History:  Procedure Laterality Date  . BREAST SURGERY  2000   reduction  . CARPAL TUNNEL RELEASE     left  . CESAREAN SECTION    . COSMETIC SURGERY  2000   breast reduction  . REDUCTION MAMMAPLASTY Bilateral   . ROTATOR CUFF REPAIR  10/2019    There were no vitals filed for this visit.                      [facility] Adult PT Treatment/Exercise - 11/20/19 0001      Vasopneumatic   Number Minutes Vasopneumatic  15 minutes    Vasopnuematic Location  Shoulder    Vasopneumatic Pressure Medium    Vasopneumatic Temperature  34      Manual Therapy   Soft tissue mobilization bil upper traps, suboccipitals & cervical traction    Passive ROM PROM as allowed per protocol.                     PT Short Term Goals - 10/30/19 0846      PT SHORT TERM GOAL #1   Title I with initial HEP    Time 3    Period Weeks    Status New    Target Date 11/20/19      PT SHORT TERM GOAL #2   Title review  FOTO results and expectaions    Time 3    Period Weeks    Status New    Target Date 11/20/19      PT SHORT TERM GOAL #3   Title tolerate AAROM Rt shoulder    Time 3    Period Weeks    Status New    Target Date 11/20/19      PT SHORT TERM GOAL #4   Title PROM Rt shoulder within protocol    Time 3    Period Weeks    Status New    Target Date 11/20/19             PT Long Term Goals - 10/30/19 0853      PT LONG TERM GOAL #1   Title I with advanced HEP for shoulder    Time 6    Period Weeks    Status New    Target Date 12/11/19  PT LONG TERM GOAL #2   Title improve FOTO =/< 48% limited    Time 6    Period Weeks    Status New    Target Date 12/11/19      PT LONG TERM GOAL #3   Title demo Rt shoulder AROM WFL to allow her to use the arm for ADLs    Time 6    Period Weeks    Status New    Target Date 12/11/19      PT LONG TERM GOAL #4   Title demo Rt UE strength =/> 4+/5 to use for light house work    Time 6    Period Weeks    Status New    Target Date 12/11/19      PT LONG TERM GOAL #5   Title report =/> 75% reduction in Rt shoulder pain    Time 6    Period Weeks    Status New    Target Date 12/11/19                 Plan - 11/20/19 1132    Clinical Impression Statement Pt is doing very well progressing toward goals at this point post op. Is exprienced increased pain with removal of sling so we discussed rest break positions as well as use of sling at night to avoid tossing arm around. Pt reported feeling better following treatment and vaso was utiilized following manual.    PT Treatment/Interventions Iontophoresis 4mg /ml Dexamethasone;Taping;Vasopneumatic Device;Patient/family education;Moist Heat;Ultrasound;Therapeutic activities;Passive range of motion;Therapeutic exercise;Cryotherapy;Electrical Stimulation;Manual techniques;Dry needling    PT Next Visit Plan PROM Rt shoulder, scap stabilitiy - protocol for RTC repair, modalities PRN for pain,  review STGs    PT Home Exercise Plan [id number]: scap retract, pendulums, elbow flexion AROM, upper trap stretch, wrist flexion/extension    Consulted and Agree with Plan of Care Patient           Patient will benefit from skilled therapeutic intervention in order to improve the following deficits and impairments:  Decreased range of motion, Obesity, Impaired UE functional use, Increased muscle spasms, Pain, Decreased strength, Increased edema  Visit Diagnosis: Stiffness of right shoulder, not elsewhere classified  Acute pain of right shoulder  Muscle weakness (generalized)     Problem List Patient Active Problem List   Diagnosis Date Noted  . Status post right rotator cuff repair 10/22/2019  . Nontraumatic complete tear of right rotator cuff 04/29/2019  . 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 Trigger index finger of right hand 11/20/2018  . Arthritis of carpometacarpal [facility]) joint of left thumb 11/20/2018  . Vitamin D deficiency 11/19/2018  . Chronic back pain   . Special screening for malignant neoplasms, colon 08/16/2017  . Type 2 diabetes mellitus with hyperlipidemia ([place]) 02/13/2017  . Leg swelling 02/13/2017  . Chronic constipation 11/20/2016  . Tear of MCL (medial collateral ligament) of knee, left, subsequent encounter 04/06/2016  . Anterolisthesis 04/06/2016  . Situational mixed anxiety and depressive disorder 02/26/2016  . Insomnia 02/24/2016  . Back pain with radiation 02/14/2016  . Essential hypertension, benign 12/26/2015  . DDD (degenerative disc disease), lumbar 12/26/2015  . Seasonal allergies 12/26/2015  . Obesity 04/06/2015  . Anemia   . Asthma   . Depression   . Hyperlipidemia    Melody Stavola C. Melody Jenkins PT, DPT 11/20/19 11:35 AM   [place] [facility] [address] [place], [place], [address] Phone: [phone]   Fax:  [phone]  Name: Melody Jenkins MRN:  [id number] Date of Birth: 24-Mar-1966

## 2019-11-23 ENCOUNTER — Other Ambulatory Visit: Payer: Self-pay

## 2019-11-23 ENCOUNTER — Ambulatory Visit: Payer: Medicare HMO | Admitting: Physical Therapy

## 2019-11-23 ENCOUNTER — Encounter: Payer: Self-pay | Admitting: Physical Therapy

## 2019-11-23 DIAGNOSIS — M25611 Stiffness of right shoulder, not elsewhere classified: Secondary | ICD-10-CM | POA: Diagnosis not present

## 2019-11-23 DIAGNOSIS — M79642 Pain in left hand: Secondary | ICD-10-CM | POA: Diagnosis not present

## 2019-11-23 DIAGNOSIS — M25511 Pain in right shoulder: Secondary | ICD-10-CM

## 2019-11-23 DIAGNOSIS — M6281 Muscle weakness (generalized): Secondary | ICD-10-CM | POA: Diagnosis not present

## 2019-11-23 NOTE — Therapy (Signed)
[place], [place], [address] Phone: [phone]   Fax:  [phone]  Physical Therapy Treatment  Patient Details  Name: Melody Jenkins MRN: [id number] Date of Birth: 07/27/1965 Referring Provider (PT): Dr Jean Rosenthal   Encounter Date: 11/23/2019   PT End of Session - 11/23/19 0848    Visit Number 6    Number of Visits 12    Date for PT Re-Evaluation 12/11/19    Authorization Type Human MCR    Authorization Time Period auth sent in    PT Start Time 0847    PT Stop Time 0936    PT Time Calculation (min) 49 min    Activity Tolerance Patient tolerated treatment well    Behavior During Therapy [facility] for tasks assessed/performed           Past Medical History:  Diagnosis Date  . Anemia   . Anxiety   . Asthma   . Carpal tunnel syndrome   . Chronic back pain   . Depression   . History of multiple miscarriages   . Hyperlipidemia   . Torn rotator cuff     Past Surgical History:  Procedure Laterality Date  . BREAST SURGERY  2000   reduction  . CARPAL TUNNEL RELEASE     left  . CESAREAN SECTION    . COSMETIC SURGERY  2000   breast reduction  . REDUCTION MAMMAPLASTY Bilateral   . ROTATOR CUFF REPAIR  10/2019    There were no vitals filed for this visit.   Subjective Assessment - 11/23/19 0848    Subjective "I keep getting the knot in the R shoulder, pain is 8/10 today. I have been released from using the sling but I still like to use it with driving"    Patient Stated Goals use her arm again    Currently in Pain? Yes    Pain Score 8     Pain Orientation Right    Pain Descriptors / Indicators Aching;Sore    Pain Type Surgical pain    Aggravating Factors  movement in the shoulder    Pain Relieving Factors meds, ice              [facility] PT Assessment - 11/23/19 0001      Assessment   Medical Diagnosis Rt RTC repair    Referring Provider (PT) Dr Jean Rosenthal       Observation/Other Assessments   Focus on Therapeutic Outcomes (FOTO)  56% limited                         [facility] Adult PT Treatment/Exercise - 11/23/19 0001      Self-Care   Other Self-Care Comments  avoiding frequent shoulder hiking to reduce tension inthe R upper trap      Shoulder Exercises: Standing   External Rotation Strengthening;Right;12 reps;Theraband    Theraband Level (Shoulder External Rotation) Level 1 (Yellow)    Internal Rotation Strengthening;Right;12 reps;Theraband    Theraband Level (Shoulder Internal Rotation) Level 1 (Yellow)    Row Strengthening;Both;15 reps;Theraband    Theraband Level (Shoulder Row) Level 1 (Yellow)      Shoulder Exercises: Pulleys   Flexion 3 minutes    Scaption 3 minutes      Shoulder Exercises: Stretch   Other Shoulder Stretches upper trap stretch 2 x 30 sec      Vasopneumatic   Number Minutes Vasopneumatic  10 minutes    Vasopnuematic  Location  Shoulder    Vasopneumatic Pressure Medium    Vasopneumatic Temperature  34      Manual Therapy   Manual therapy comments MTPR along the R upper trap/ levator scapuale    Soft tissue mobilization IASTM along R upper trap / levator scapuale    Passive ROM PROM as allowed per protocol.                     PT Short Term Goals - 11/23/19 0930      PT SHORT TERM GOAL #1   Title I with initial HEP    Period Weeks    Status Achieved      PT SHORT TERM GOAL #2   Title review FOTO results and expectaions    Period Weeks    Status Achieved      PT SHORT TERM GOAL #3   Title tolerate AAROM Rt shoulder    Period Weeks    Status Partially Met      PT SHORT TERM GOAL #4   Title PROM Rt shoulder within protocol    Period Weeks    Status On-going             PT Long Term Goals - 10/30/19 9201      PT LONG TERM GOAL #1   Title I with advanced HEP for shoulder    Time 6    Period Weeks    Status New    Target Date 12/11/19      PT LONG TERM GOAL #2    Title improve FOTO =/< 48% limited    Time 6    Period Weeks    Status New    Target Date 12/11/19      PT LONG TERM GOAL #3   Title demo Rt shoulder AROM WFL to allow her to use the arm for ADLs    Time 6    Period Weeks    Status New    Target Date 12/11/19      PT LONG TERM GOAL #4   Title demo Rt UE strength =/> 4+/5 to use for light house work    Time 6    Period Weeks    Status New    Target Date 12/11/19      PT LONG TERM GOAL #5   Title report =/> 75% reduction in Rt shoulder pain    Time 6    Period Weeks    Status New    Target Date 12/11/19                 Plan - 11/23/19 0929    Clinical Impression Statement pt continues to progress with shoulder ROM, continued working on [employer] using pulleys which she did well with but continues to demonstrate apprehension with activity require intermittent cues for pain education. continued working shoulder strengthening progressing to isotonics with yellow band. continued Vaso end session to calm down pain.    PT Treatment/Interventions Iontophoresis 59m/ml Dexamethasone;Taping;Vasopneumatic Device;Patient/family education;Moist Heat;Ultrasound;Therapeutic activities;Passive range of motion;Therapeutic exercise;Cryotherapy;Electrical Stimulation;Manual techniques;Dry needling    PT Next Visit Plan PROM Rt shoulder, scap stabilitiy - protocol for RTC repair, modalities PRN for pain, review STGs    PT Home Exercise Plan [id number]: scap retract, pendulums, elbow flexion AROM, upper trap stretch, wrist flexion/extension    Consulted and Agree with Plan of Care Patient           Patient will benefit from skilled therapeutic intervention in order to  improve the following deficits and impairments:  Decreased range of motion, Obesity, Impaired UE functional use, Increased muscle spasms, Pain, Decreased strength, Increased edema  Visit Diagnosis: Stiffness of right shoulder, not elsewhere classified  Acute pain of right  shoulder  Muscle weakness (generalized)     Problem List Patient Active Problem List   Diagnosis Date Noted  . Status post right rotator cuff repair 10/22/2019  . Nontraumatic complete tear of right rotator cuff 04/29/2019  . 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 Trigger index finger of right hand 11/20/2018  . Arthritis of carpometacarpal [facility]) joint of left thumb 11/20/2018  . Vitamin D deficiency 11/19/2018  . Chronic back pain   . Special screening for malignant neoplasms, colon 08/16/2017  . Type 2 diabetes mellitus with hyperlipidemia ([place]) 02/13/2017  . Leg swelling 02/13/2017  . Chronic constipation 11/20/2016  . Tear of MCL (medial collateral ligament) of knee, left, subsequent encounter 04/06/2016  . Anterolisthesis 04/06/2016  . Situational mixed anxiety and depressive disorder 02/26/2016  . Insomnia 02/24/2016  . Back pain with radiation 02/14/2016  . Essential hypertension, benign 12/26/2015  . DDD (degenerative disc disease), lumbar 12/26/2015  . Seasonal allergies 12/26/2015  . Obesity 04/06/2015  . Anemia   . Asthma   . Depression   . Hyperlipidemia     Starr Lake PT, DPT, LAT, ATC  11/23/19  9:32 AM      [facility] [address] [place], [place], [address] Phone: [phone]   Fax:  [phone]  Name: Kynzie Polgar MRN: [id number] Date of Birth: [birth year]

## 2019-11-25 ENCOUNTER — Ambulatory Visit: Payer: Medicare HMO | Admitting: Physical Therapy

## 2019-11-25 ENCOUNTER — Other Ambulatory Visit: Payer: Self-pay

## 2019-11-25 ENCOUNTER — Encounter: Payer: Self-pay | Admitting: Physical Therapy

## 2019-11-25 DIAGNOSIS — M79642 Pain in left hand: Secondary | ICD-10-CM | POA: Diagnosis not present

## 2019-11-25 DIAGNOSIS — M25511 Pain in right shoulder: Secondary | ICD-10-CM | POA: Diagnosis not present

## 2019-11-25 DIAGNOSIS — M6281 Muscle weakness (generalized): Secondary | ICD-10-CM | POA: Diagnosis not present

## 2019-11-25 DIAGNOSIS — M25611 Stiffness of right shoulder, not elsewhere classified: Secondary | ICD-10-CM | POA: Diagnosis not present

## 2019-11-25 NOTE — Therapy (Signed)
[place], [place], [address] Phone: [phone]   Fax:  [phone]  Physical Therapy Treatment  Patient Details  Name: Melody Jenkins MRN: [id number] Date of Birth: February 24, 1966 Referring Provider (PT): Dr Jean Rosenthal   Encounter Date: 11/25/2019   PT End of Session - 11/25/19 0848    Visit Number 7    Number of Visits 12    Date for PT Re-Evaluation 12/11/19    Authorization Type Human MCR    Progress Note Due on Visit 10    PT Start Time 0847    PT Stop Time 0937    PT Time Calculation (min) 50 min    Activity Tolerance Patient tolerated treatment well    Behavior During Therapy [facility] for tasks assessed/performed           Past Medical History:  Diagnosis Date  . Anemia   . Anxiety   . Asthma   . Carpal tunnel syndrome   . Chronic back pain   . Depression   . History of multiple miscarriages   . Hyperlipidemia   . Torn rotator cuff     Past Surgical History:  Procedure Laterality Date  . BREAST SURGERY  2000   reduction  . CARPAL TUNNEL RELEASE     left  . CESAREAN SECTION    . COSMETIC SURGERY  2000   breast reduction  . REDUCTION MAMMAPLASTY Bilateral   . ROTATOR CUFF REPAIR  10/2019    There were no vitals filed for this visit.   Subjective Assessment - 11/25/19 0848    Subjective "I feel like I am doing alittle bit better today compared to the last session"    Patient Stated Goals use her arm again    Currently in Pain? Yes    Pain Score 7     Pain Orientation Right    Pain Type Surgical pain    Pain Onset More than a month ago    Pain Frequency Intermittent              [facility] PT Assessment - 11/25/19 0001      Assessment   Medical Diagnosis Rt RTC repair    Referring Provider (PT) Dr Jean Rosenthal      AROM   Right/Left Shoulder Right    Right Shoulder Flexion 117 Degrees   AAROM using pulleys                         [facility] Adult PT Treatment/Exercise - 11/25/19 0851      Shoulder Exercises: Supine   Protraction Strengthening;Both;10 reps;AAROM   with dowel rod     Shoulder Exercises: Standing   External Rotation Strengthening;Right;12 reps;Theraband    Theraband Level (Shoulder External Rotation) Level 1 (Yellow)    Internal Rotation Strengthening;Right;12 reps;Theraband    Theraband Level (Shoulder Internal Rotation) Level 1 (Yellow)    Row Strengthening;Both;15 reps;Theraband    Theraband Level (Shoulder Row) Level 1 (Yellow)      Shoulder Exercises: Pulleys   Flexion 2 minutes    Scaption 2 minutes      Shoulder Exercises: ROM/Strengthening   Nustep L3 x 5 min UE/LE   intermittent cues to direct focus on other things vs shoulde   Other ROM/Strengthening Exercises wand flexion and ER in supine 1 x 10 AAROM       Vasopneumatic   Number Minutes Vasopneumatic  10 minutes    Vasopnuematic Location  Shoulder    Vasopneumatic Pressure Medium    Vasopneumatic Temperature  34                  PT Education - 11/25/19 0934    Education Details updated HEP today for shoulder IR/ ER and rows    Person(s) Educated Patient    Methods Explanation;Verbal cues;Handout    Comprehension Verbalized understanding;Verbal cues required            PT Short Term Goals - 11/23/19 0930      PT SHORT TERM GOAL #1   Title I with initial HEP    Period Weeks    Status Achieved      PT SHORT TERM GOAL #2   Title review FOTO results and expectaions    Period Weeks    Status Achieved      PT SHORT TERM GOAL #3   Title tolerate AAROM Rt shoulder    Period Weeks    Status Partially Met      PT SHORT TERM GOAL #4   Title PROM Rt shoulder within protocol    Period Weeks    Status On-going             PT Long Term Goals - 10/30/19 0932      PT LONG TERM GOAL #1   Title I with advanced HEP for shoulder    Time 6    Period Weeks    Status New    Target Date 12/11/19      PT LONG TERM  GOAL #2   Title improve FOTO =/< 48% limited    Time 6    Period Weeks    Status New    Target Date 12/11/19      PT LONG TERM GOAL #3   Title demo Rt shoulder AROM WFL to allow her to use the arm for ADLs    Time 6    Period Weeks    Status New    Target Date 12/11/19      PT LONG TERM GOAL #4   Title demo Rt UE strength =/> 4+/5 to use for light house work    Time 6    Period Weeks    Status New    Target Date 12/11/19      PT LONG TERM GOAL #5   Title report =/> 75% reduction in Rt shoulder pain    Time 6    Period Weeks    Status New    Target Date 12/11/19                 Plan - 11/25/19 0913    Clinical Impression Statement pt increased AAROM flexion using pulleys to 117 degrees today. continued working on [employer] in supine using dowel rod. continued progres strengthening to isotonics today using yellow band. she continues to require intermittent cues that pain isn't damage and for directing focus away from the shoulder. continued Vaso end of session to calm down pain and inflammation.    PT Treatment/Interventions Iontophoresis 59m/ml Dexamethasone;Taping;Vasopneumatic Device;Patient/family education;Moist Heat;Ultrasound;Therapeutic activities;Passive range of motion;Therapeutic exercise;Cryotherapy;Electrical Stimulation;Manual techniques;Dry needling    PT Next Visit Plan PROM Rt shoulder, scap stabilitiy - protocol for RTC repair, modalities PRN for pain,    PT Home Exercise Plan [id number]: scap retract, pendulums, elbow flexion AROM, upper trap stretch, wrist flexion/extension, shoulder IR/ER and rows with yellow band    Consulted and Agree with Plan of Care Patient  Patient will benefit from skilled therapeutic intervention in order to improve the following deficits and impairments:  Decreased range of motion, Obesity, Impaired UE functional use, Increased muscle spasms, Pain, Decreased strength, Increased edema  Visit Diagnosis: Stiffness of  right shoulder, not elsewhere classified  Acute pain of right shoulder  Muscle weakness (generalized)     Problem List Patient Active Problem List   Diagnosis Date Noted  . Status post right rotator cuff repair 10/22/2019  . Nontraumatic complete tear of right rotator cuff 04/29/2019  . 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 Trigger index finger of right hand 11/20/2018  . Arthritis of carpometacarpal [facility]) joint of left thumb 11/20/2018  . Vitamin D deficiency 11/19/2018  . Chronic back pain   . Special screening for malignant neoplasms, colon 08/16/2017  . Type 2 diabetes mellitus with hyperlipidemia ([place]) 02/13/2017  . Leg swelling 02/13/2017  . Chronic constipation 11/20/2016  . Tear of MCL (medial collateral ligament) of knee, left, subsequent encounter 04/06/2016  . Anterolisthesis 04/06/2016  . Situational mixed anxiety and depressive disorder 02/26/2016  . Insomnia 02/24/2016  . Back pain with radiation 02/14/2016  . Essential hypertension, benign 12/26/2015  . DDD (degenerative disc disease), lumbar 12/26/2015  . Seasonal allergies 12/26/2015  . Obesity 04/06/2015  . Anemia   . Asthma   . Depression   . Hyperlipidemia    Starr Lake PT, DPT, LAT, ATC  11/25/19  9:35 AM      [facility] [address] [place], [place], [address] Phone: [phone]   Fax:  [phone]  Name: Melody Jenkins MRN: [id number] Date of Birth: 1966-03-05

## 2019-11-30 ENCOUNTER — Encounter: Payer: Self-pay | Admitting: Family Medicine

## 2019-11-30 ENCOUNTER — Encounter: Payer: Self-pay | Admitting: Orthopaedic Surgery

## 2019-11-30 ENCOUNTER — Other Ambulatory Visit: Payer: Self-pay | Admitting: Orthopaedic Surgery

## 2019-11-30 ENCOUNTER — Other Ambulatory Visit: Payer: Self-pay

## 2019-11-30 ENCOUNTER — Ambulatory Visit: Payer: Medicare HMO | Admitting: Physical Therapy

## 2019-11-30 DIAGNOSIS — M25611 Stiffness of right shoulder, not elsewhere classified: Secondary | ICD-10-CM

## 2019-11-30 DIAGNOSIS — M79642 Pain in left hand: Secondary | ICD-10-CM | POA: Diagnosis not present

## 2019-11-30 DIAGNOSIS — M25511 Pain in right shoulder: Secondary | ICD-10-CM

## 2019-11-30 DIAGNOSIS — M6281 Muscle weakness (generalized): Secondary | ICD-10-CM

## 2019-11-30 DIAGNOSIS — F411 Generalized anxiety disorder: Secondary | ICD-10-CM

## 2019-11-30 MED ORDER — CLONIDINE HCL 0.2 MG PO TABS: 0.2000 mg | ORAL_TABLET | Freq: Two times a day (BID) | ORAL | 3 refills | Status: DC

## 2019-11-30 MED ORDER — TIZANIDINE HCL 4 MG PO TABS
4.0000 mg | ORAL_TABLET | Freq: Three times a day (TID) | ORAL | 0 refills | Status: DC | PRN
Start: 2019-11-30 — End: 2019-12-08

## 2019-11-30 MED ORDER — HYDROCODONE-ACETAMINOPHEN 5-325 MG PO TABS: 1.0000 | ORAL_TABLET | Freq: Four times a day (QID) | ORAL | 0 refills | Status: DC | PRN

## 2019-11-30 NOTE — Therapy (Signed)
[place], [place], [address] Phone: [phone]   Fax:  [phone]  Physical Therapy Treatment  Patient Details  Name: Melody Jenkins MRN: [id number] Date of Birth: 06-26-1965 Referring Provider (PT): Dr Jean Rosenthal   Encounter Date: 11/30/2019   PT End of Session - 11/30/19 0855    Visit Number 8    Number of Visits 12    Date for PT Re-Evaluation 12/11/19    Authorization Type Human MCR    Authorization Time Period auth sent in    Progress Note Due on Visit 10    PT Start Time 0851    PT Stop Time 0934    PT Time Calculation (min) 43 min    Activity Tolerance Patient tolerated treatment well    Behavior During Therapy [facility] for tasks assessed/performed           Past Medical History:  Diagnosis Date  . Anemia   . Anxiety   . Asthma   . Carpal tunnel syndrome   . Chronic back pain   . Depression   . History of multiple miscarriages   . Hyperlipidemia   . Torn rotator cuff     Past Surgical History:  Procedure Laterality Date  . BREAST SURGERY  2000   reduction  . CARPAL TUNNEL RELEASE     left  . CESAREAN SECTION    . COSMETIC SURGERY  2000   breast reduction  . REDUCTION MAMMAPLASTY Bilateral   . ROTATOR CUFF REPAIR  10/2019    There were no vitals filed for this visit.   Subjective Assessment - 11/30/19 0855    Subjective " I don't know what I did over the weekend but my R shoulder really started bothering. I was trying to reach with plate and cup and I felt pain in my R shoulder"    Currently in Pain? Yes    Pain Score 8    last took medication about 40 min prior to today's session   Pain Location Shoulder    Pain Orientation Right    Pain Descriptors / Indicators Aching    Pain Type Surgical pain    Pain Onset More than a month ago    Pain Frequency Intermittent    Aggravating Factors  any shoulder movement    Pain Relieving Factors meds, ice               [facility] PT Assessment - 11/30/19 0001      Assessment   Medical Diagnosis Rt RTC repair    Referring Provider (PT) Dr Jean Rosenthal                         [facility] Adult PT Treatment/Exercise - 11/30/19 0001      Shoulder Exercises: Pulleys   Flexion 3 minutes    Scaption 2 minutes      Shoulder Exercises: ROM/Strengthening   Nustep L3 x 5 min UE/LE      Modalities   Modalities Moist Heat      Moist Heat Therapy   Number Minutes Moist Heat 10 Minutes    Moist Heat Location Shoulder   R in sitting     Manual Therapy   Manual therapy comments MTPR along the R upper trap/ levator scapuale, middle deltoid    Soft tissue mobilization IASTM along R upper trap / levator scapuale, middle deltoid  PT Education - 11/30/19 0858    Education Details reviewed spasm and what could have happened, but noted we will continue to monitor the situation. Reviewed precautions regarding lifting with the RUE.    Person(s) Educated Patient    Methods Explanation;Verbal cues    Comprehension Verbalized understanding;Verbal cues required            PT Short Term Goals - 11/23/19 0930      PT SHORT TERM GOAL #1   Title I with initial HEP    Period Weeks    Status Achieved      PT SHORT TERM GOAL #2   Title review FOTO results and expectaions    Period Weeks    Status Achieved      PT SHORT TERM GOAL #3   Title tolerate AAROM Rt shoulder    Period Weeks    Status Partially Met      PT SHORT TERM GOAL #4   Title PROM Rt shoulder within protocol    Period Weeks    Status On-going             PT Long Term Goals - 10/30/19 7858      PT LONG TERM GOAL #1   Title I with advanced HEP for shoulder    Time 6    Period Weeks    Status New    Target Date 12/11/19      PT LONG TERM GOAL #2   Title improve FOTO =/< 48% limited    Time 6    Period Weeks    Status New    Target Date 12/11/19      PT LONG TERM GOAL #3   Title  demo Rt shoulder AROM WFL to allow her to use the arm for ADLs    Time 6    Period Weeks    Status New    Target Date 12/11/19      PT LONG TERM GOAL #4   Title demo Rt UE strength =/> 4+/5 to use for light house work    Time 6    Period Weeks    Status New    Target Date 12/11/19      PT LONG TERM GOAL #5   Title report =/> 75% reduction in Rt shoulder pain    Time 6    Period Weeks    Status New    Target Date 12/11/19                 Plan - 11/30/19 8502    Clinical Impression Statement pt arrived to today's session with increased soreness/ apprehension that she caused damage. Reviewed tha tshe is 6 weeks post op and that she did more then she was used to which aggrivated her shoulder. Focused on pain relief STW and AAROM for the L shoulder which she responded well to noting significant relief of pain and apprehnsion. trialed MHP end of session to calm down soreness.    PT Treatment/Interventions Iontophoresis 57m/ml Dexamethasone;Taping;Vasopneumatic Device;Patient/family education;Moist Heat;Ultrasound;Therapeutic activities;Passive range of motion;Therapeutic exercise;Cryotherapy;Electrical Stimulation;Manual techniques;Dry needling    PT Next Visit Plan PROM Rt shoulder, pt is 6 weeks post op scap stabilitiy - protocol for RTC repair, modalities PRN for pain,    PT Home Exercise Plan [id number]: scap retract, pendulums, elbow flexion AROM, upper trap stretch, wrist flexion/extension, shoulder IR/ER and rows with yellow band           Patient will benefit from skilled therapeutic intervention in  order to improve the following deficits and impairments:  Decreased range of motion, Obesity, Impaired UE functional use, Increased muscle spasms, Pain, Decreased strength, Increased edema  Visit Diagnosis: Stiffness of right shoulder, not elsewhere classified  Acute pain of right shoulder  Muscle weakness (generalized)     Problem List Patient Active Problem List    Diagnosis Date Noted  . Status post right rotator cuff repair 10/22/2019  . Nontraumatic complete tear of right rotator cuff 04/29/2019  . 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 Trigger index finger of right hand 11/20/2018  . Arthritis of carpometacarpal [facility]) joint of left thumb 11/20/2018  . Vitamin D deficiency 11/19/2018  . Chronic back pain   . Special screening for malignant neoplasms, colon 08/16/2017  . Type 2 diabetes mellitus with hyperlipidemia ([place]) 02/13/2017  . Leg swelling 02/13/2017  . Chronic constipation 11/20/2016  . Tear of MCL (medial collateral ligament) of knee, left, subsequent encounter 04/06/2016  . Anterolisthesis 04/06/2016  . Situational mixed anxiety and depressive disorder 02/26/2016  . Insomnia 02/24/2016  . Back pain with radiation 02/14/2016  . Essential hypertension, benign 12/26/2015  . DDD (degenerative disc disease), lumbar 12/26/2015  . Seasonal allergies 12/26/2015  . Obesity 04/06/2015  . Anemia   . Asthma   . Depression   . Hyperlipidemia     Starr Lake PT, DPT, LAT, ATC  11/30/19  9:27 AM      [facility] [address] [place], [place], [address] Phone: [phone]   Fax:  [phone]  Name: Melody Jenkins MRN: [id number] Date of Birth: [birth year]

## 2019-12-02 ENCOUNTER — Other Ambulatory Visit: Payer: Self-pay

## 2019-12-02 ENCOUNTER — Encounter: Payer: Self-pay | Admitting: Physical Therapy

## 2019-12-02 ENCOUNTER — Ambulatory Visit: Payer: Medicare HMO | Admitting: Physical Therapy

## 2019-12-02 DIAGNOSIS — M25511 Pain in right shoulder: Secondary | ICD-10-CM | POA: Diagnosis not present

## 2019-12-02 DIAGNOSIS — M6281 Muscle weakness (generalized): Secondary | ICD-10-CM

## 2019-12-02 DIAGNOSIS — M25611 Stiffness of right shoulder, not elsewhere classified: Secondary | ICD-10-CM

## 2019-12-02 DIAGNOSIS — M79642 Pain in left hand: Secondary | ICD-10-CM | POA: Diagnosis not present

## 2019-12-02 NOTE — Therapy (Signed)
[place], [place], [address] Phone: [phone]   Fax:  [phone]  Physical Therapy Treatment  Patient Details  Name: Melody Jenkins MRN: [id number] Date of Birth: 10-Mar-1966 Referring Provider (PT): Dr Jean Rosenthal   Encounter Date: 12/02/2019   PT End of Session - 12/02/19 0858    Visit Number 9    Number of Visits 12    Date for PT Re-Evaluation 12/11/19    Authorization Type Human MCR    Authorization Time Period auth sent in    Progress Note Due on Visit 10    PT Start Time 0850    PT Stop Time 0938    PT Time Calculation (min) 48 min    Activity Tolerance Patient tolerated treatment well    Behavior During Therapy [facility] for tasks assessed/performed           Past Medical History:  Diagnosis Date   Anemia    Anxiety    Asthma    Carpal tunnel syndrome    Chronic back pain    Depression    History of multiple miscarriages    Hyperlipidemia    Torn rotator cuff     Past Surgical History:  Procedure Laterality Date   BREAST SURGERY  2000   reduction   CARPAL TUNNEL RELEASE     left   CESAREAN SECTION     COSMETIC SURGERY  2000   breast reduction   REDUCTION MAMMAPLASTY Bilateral    ROTATOR CUFF REPAIR  10/2019    There were no vitals filed for this visit.   Subjective Assessment - 12/02/19 0854    Subjective " I reached out to the MD and he reaffirmed that the healing is going to take time. He wrote a presprictpin for pain meds and muscle relaxer. I don't know what happens but I get so stiff in the morning."    Patient Stated Goals use her arm again    Currently in Pain? Yes    Pain Score 8     Pain Location Shoulder    Pain Orientation Right    Pain Descriptors / Indicators Aching;Sore    Pain Type Surgical pain    Pain Onset More than a month ago    Pain Frequency Intermittent              [facility] PT Assessment - 12/02/19 0001       Assessment   Medical Diagnosis Rt RTC repair    Referring Provider (PT) Dr Jean Rosenthal                         [facility] Adult PT Treatment/Exercise - 12/02/19 0001      Shoulder Exercises: Supine   Protraction Strengthening;AAROM;Both;10 reps   with pt's cane     Shoulder Exercises: Seated   Other Seated Exercises scapular retraction with bil ER 1 x 10 with yellow theraband      Shoulder Exercises: Standing   External Rotation Strengthening;Right;Theraband;15 reps    Theraband Level (Shoulder External Rotation) Level 1 (Yellow)    Internal Rotation Strengthening;Right;Theraband;15 reps    Theraband Level (Shoulder Internal Rotation) Level 1 (Yellow)    Row Strengthening;Both;15 reps;Theraband    Theraband Level (Shoulder Row) Level 1 (Yellow)      Shoulder Exercises: ROM/Strengthening   Other ROM/Strengthening Exercises wand flexion 1 x 10 overhead in supine      Moist Heat Therapy   Number  Minutes Moist Heat 10 Minutes    Moist Heat Location Shoulder   in supine     Manual Therapy   Manual therapy comments MTPR along the R upper trap/ levator scapuale, middle deltoid    Joint Mobilization PA/ AP grade I for pain with grade 1 distraction    Soft tissue mobilization IASTM along R upper trap / levator scapuale, middle deltoid    Passive ROM R shoulder flexion / abdudction working into end ranges with gentle pertubations for pain                    PT Short Term Goals - 11/23/19 0930      PT SHORT TERM GOAL #1   Title I with initial HEP    Period Weeks    Status Achieved      PT SHORT TERM GOAL #2   Title review FOTO results and expectaions    Period Weeks    Status Achieved      PT SHORT TERM GOAL #3   Title tolerate AAROM Rt shoulder    Period Weeks    Status Partially Met      PT SHORT TERM GOAL #4   Title PROM Rt shoulder within protocol    Period Weeks    Status On-going             PT Long Term Goals - 10/30/19 4332        PT LONG TERM GOAL #1   Title I with advanced HEP for shoulder    Time 6    Period Weeks    Status New    Target Date 12/11/19      PT LONG TERM GOAL #2   Title improve FOTO =/< 48% limited    Time 6    Period Weeks    Status New    Target Date 12/11/19      PT LONG TERM GOAL #3   Title demo Rt shoulder AROM WFL to allow her to use the arm for ADLs    Time 6    Period Weeks    Status New    Target Date 12/11/19      PT LONG TERM GOAL #4   Title demo Rt UE strength =/> 4+/5 to use for light house work    Time 6    Period Weeks    Status New    Target Date 12/11/19      PT LONG TERM GOAL #5   Title report =/> 75% reduction in Rt shoulder pain    Time 6    Period Weeks    Status New    Target Date 12/11/19                 Plan - 12/02/19 0913    Clinical Impression Statement pt reports get updated prescriptions from her MD for pain and muscle spasm. Continued working ROM with shoulder mobs/ PROM and progressing to supine AAROM. She contineus to require frequent cues that pain isn't damage and PNE techniques. She was able to perform exercises well in combination with mod cues for form.    PT Treatment/Interventions Iontophoresis 27m/ml Dexamethasone;Taping;Vasopneumatic Device;Patient/family education;Moist Heat;Ultrasound;Therapeutic activities;Passive range of motion;Therapeutic exercise;Cryotherapy;Electrical Stimulation;Manual techniques;Dry needling    PT Next Visit Plan PROM Rt shoulder, pt is 6 weeks post op scap stabilitiy - protocol for RTC repair, modalities PRN for pain,    PT Home Exercise Plan [id number]: scap retract, pendulums, elbow flexion AROM, upper trap  stretch, wrist flexion/extension, shoulder IR/ER and rows with yellow band           Patient will benefit from skilled therapeutic intervention in order to improve the following deficits and impairments:  Decreased range of motion, Obesity, Impaired UE functional use, Increased muscle spasms, Pain,  Decreased strength, Increased edema  Visit Diagnosis: Stiffness of right shoulder, not elsewhere classified  Acute pain of right shoulder  Muscle weakness (generalized)     Problem List Patient Active Problem List   Diagnosis Date Noted   Status post right rotator cuff repair 10/22/2019   Nontraumatic complete tear of right rotator cuff 04/29/2019   Wrist arthritis 11/20/2018   Trigger index finger of right hand 11/20/2018   Arthritis of carpometacarpal (CMC) joint of left thumb 11/20/2018   Vitamin D deficiency 11/19/2018   Chronic back pain    Special screening for malignant neoplasms, colon 08/16/2017   Type 2 diabetes mellitus with hyperlipidemia ([place]) 02/13/2017   Leg swelling 02/13/2017   Chronic constipation 11/20/2016   Tear of MCL (medial collateral ligament) of knee, left, subsequent encounter 04/06/2016   Anterolisthesis 04/06/2016   Situational mixed anxiety and depressive disorder 02/26/2016   Insomnia 02/24/2016   Back pain with radiation 02/14/2016   Essential hypertension, benign 12/26/2015   DDD (degenerative disc disease), lumbar 12/26/2015   Seasonal allergies 12/26/2015   Obesity 04/06/2015   Anemia    Asthma    Depression    Hyperlipidemia    Jerri Hargadon PT, DPT, LAT, ATC  12/02/19  9:32 AM      [facility] [address] [place], [place], [address] Phone: [phone]   Fax:  [phone]  Name: Tonnya Garbett MRN: [id number] Date of Birth: 1965-10-13

## 2019-12-07 ENCOUNTER — Ambulatory Visit: Payer: Medicare HMO | Admitting: Physical Therapy

## 2019-12-08 ENCOUNTER — Other Ambulatory Visit: Payer: Self-pay | Admitting: Orthopaedic Surgery

## 2019-12-09 ENCOUNTER — Encounter: Payer: Self-pay | Admitting: Physical Therapy

## 2019-12-09 ENCOUNTER — Ambulatory Visit: Payer: Medicare HMO | Admitting: Physical Therapy

## 2019-12-09 ENCOUNTER — Other Ambulatory Visit: Payer: Self-pay

## 2019-12-09 DIAGNOSIS — M25611 Stiffness of right shoulder, not elsewhere classified: Secondary | ICD-10-CM | POA: Diagnosis not present

## 2019-12-09 DIAGNOSIS — M25511 Pain in right shoulder: Secondary | ICD-10-CM | POA: Diagnosis not present

## 2019-12-09 DIAGNOSIS — M79642 Pain in left hand: Secondary | ICD-10-CM | POA: Diagnosis not present

## 2019-12-09 DIAGNOSIS — M6281 Muscle weakness (generalized): Secondary | ICD-10-CM

## 2019-12-09 NOTE — Therapy (Signed)
[place], [place], [address] Phone: [phone]   Fax:  [phone]  Physical Therapy Treatment / Re-certificaiton Progress Note Reporting Period 10/30/2019 to 12/09/2019   See note below for Objective Data and Assessment of Progress/Goals.       Patient Details  Name: Melody Jenkins MRN: [id number] Date of Birth: 05-Feb-1966 Referring Provider (PT): Dr Jean Rosenthal   Encounter Date: 12/09/2019   PT End of Session - 12/09/19 0848    Visit Number 10    Number of Visits 18    Date for PT Re-Evaluation 01/06/20    Progress Note Due on Visit 20    PT Start Time 0848    PT Stop Time 0937    PT Time Calculation (min) 49 min    Activity Tolerance Patient tolerated treatment well    Behavior During Therapy [facility] for tasks assessed/performed           Past Medical History:  Diagnosis Date  . Anemia   . Anxiety   . Asthma   . Carpal tunnel syndrome   . Chronic back pain   . Depression   . History of multiple miscarriages   . Hyperlipidemia   . Torn rotator cuff     Past Surgical History:  Procedure Laterality Date  . BREAST SURGERY  2000   reduction  . CARPAL TUNNEL RELEASE     left  . CESAREAN SECTION    . COSMETIC SURGERY  2000   breast reduction  . REDUCTION MAMMAPLASTY Bilateral   . ROTATOR CUFF REPAIR  10/2019    There were no vitals filed for this visit.   Subjective Assessment - 12/09/19 0850    Subjective "I got my first COVID shot the other day. I still get a sharp stabbing pain in the same place in my shoulder I am stiil a bit nervous about the pain."    Patient Stated Goals use her arm again    Currently in Pain? Yes    Pain Score 7     Pain Orientation Right    Pain Descriptors / Indicators Aching;Sore    Pain Type Chronic pain    Pain Onset More than a month ago    Pain Frequency Intermittent    Aggravating Factors  any shoulder lifting    Pain Relieving  Factors meds, ice,              [facility] PT Assessment - 12/09/19 0001      Assessment   Medical Diagnosis Rt RTC repair    Referring Provider (PT) Dr Jean Rosenthal    Onset Date/Surgical Date 12/17/19    Hand Dominance Left      ROM / Strength   AROM / PROM / Strength Strength      AROM   Right Shoulder Extension 38 Degrees    Right Shoulder Flexion 68 Degrees    Right Shoulder ABduction 66 Degrees    Right Shoulder Internal Rotation --   L4   Right Shoulder External Rotation --   occipital bone     PROM   Right Shoulder Flexion 130 Degrees    Right Shoulder ABduction 100 Degrees      Strength   Overall Strength Unable to assess;Due to pain    Overall Strength Comments unable to test R shoulder strength due to irritability / severity    Strength Assessment Site Shoulder    Right/Left Shoulder Right;Left    Left Shoulder Flexion  4-/5    Left Shoulder Extension 4+/5    Left Shoulder ABduction 4-/5    Left Shoulder Internal Rotation 4-/5    Left Shoulder External Rotation 4-/5                         [facility] Adult PT Treatment/Exercise - 12/09/19 0001      Shoulder Exercises: ROM/Strengthening   UBE (Upper Arm Bike) L1 x 4 min    fwd/bwd x 2 min ea.   Other ROM/Strengthening Exercises wand flexion 1 x 10 overhead in supine    Other ROM/Strengthening Exercises towel IR pulling across the low back 1 x 10   wall walks 1 x 10 flexion with controlled eccentric lowering     Vasopneumatic   Number Minutes Vasopneumatic  10 minutes    Vasopnuematic Location  Shoulder    Vasopneumatic Pressure Medium    Vasopneumatic Temperature  34      Manual Therapy   Manual therapy comments MTPR along the R upper trap/ levator scapuale, middle deltoid    Passive ROM R shoulder flexion / abdudction working into end ranges with gentle pertubations for pain                  PT Education - 12/09/19 0908    Education Details Reviewed HEp and updated today for  towel IR, and wall walks in flexion.    Person(s) Educated Patient    Methods Explanation;Verbal cues;Handout    Comprehension Verbalized understanding;Verbal cues required            PT Short Term Goals - 12/09/19 0910      PT SHORT TERM GOAL #1   Title I with initial HEP    Status Achieved      PT SHORT TERM GOAL #2   Title review FOTO results and expectaions    Status Achieved      PT SHORT TERM GOAL #3   Title tolerate AAROM Rt shoulder    Status Achieved      PT SHORT TERM GOAL #4   Title PROM Rt shoulder within protocol    Period Weeks    Status Achieved             PT Long Term Goals - 12/09/19 0910      PT LONG TERM GOAL #1   Title I with advanced HEP for shoulder    Period Weeks    Status On-going      PT LONG TERM GOAL #2   Title improve FOTO =/< 48% limited    Period Weeks    Status On-going      PT LONG TERM GOAL #3   Title demo Rt shoulder AROM WFL to allow her to use the arm for ADLs    Period Weeks    Status On-going      PT LONG TERM GOAL #4   Title demo Rt UE strength =/> 4+/5 to use for light house work    Period Weeks    Status Unable to assess      PT LONG TERM GOAL #5   Title report =/> 75% reduction in Rt shoulder pain    Period Weeks    Status On-going                 Plan - 12/09/19 0913    Clinical Impression Statement Mrs Morath continues to make progress with physical therapy increasing her AAROM and reducing the severity  of her pain. She conitnues to note 6-8/10 pain in the shoulder and continues to require intermittent cues that pain doesn't equal damage. she is making progress toward her LTGs and would benefit from continued physcial therapy to increase R shoulder ROM/ strength and maximize overall function by addressing the deficits listed.    PT Frequency 2x / week    PT Duration 4 weeks    PT Treatment/Interventions Iontophoresis 4mg /ml Dexamethasone;Taping;Vasopneumatic Device;Patient/family education;Moist  Heat;Ultrasound;Therapeutic activities;Passive range of motion;Therapeutic exercise;Cryotherapy;Electrical Stimulation;Manual techniques;Dry needling    PT Next Visit Plan pt is 8 weeks post of 12/10/2019, continued progression of ROM AAROM to AROM, shoulder strengthening as tolerated, vaso for pain    PT Home Exercise Plan [id number]: scap retract, pendulums, elbow flexion AROM, upper trap stretch, wrist flexion/extension, shoulder IR/ER and rows with yellow band, towel IR, and wall walks    Consulted and Agree with Plan of Care Patient           Patient will benefit from skilled therapeutic intervention in order to improve the following deficits and impairments:  Decreased range of motion, Obesity, Impaired UE functional use, Increased muscle spasms, Pain, Decreased strength, Increased edema  Visit Diagnosis: Stiffness of right shoulder, not elsewhere classified  Acute pain of right shoulder  Muscle weakness (generalized)     Problem List Patient Active Problem List   Diagnosis Date Noted  . Status post right rotator cuff repair 10/22/2019  . Nontraumatic complete tear of right rotator cuff 04/29/2019  . 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 Trigger index finger of right hand 11/20/2018  . Arthritis of carpometacarpal [facility]) joint of left thumb 11/20/2018  . Vitamin D deficiency 11/19/2018  . Chronic back pain   . Special screening for malignant neoplasms, colon 08/16/2017  . Type 2 diabetes mellitus with hyperlipidemia ([place]) 02/13/2017  . Leg swelling 02/13/2017  . Chronic constipation 11/20/2016  . Tear of MCL (medial collateral ligament) of knee, left, subsequent encounter 04/06/2016  . Anterolisthesis 04/06/2016  . Situational mixed anxiety and depressive disorder 02/26/2016  . Insomnia 02/24/2016  . Back pain with radiation 02/14/2016  . Essential hypertension, benign 12/26/2015  . DDD (degenerative disc disease), lumbar 12/26/2015  . Seasonal allergies 12/26/2015  . Obesity  04/06/2015  . Anemia   . Asthma   . Depression   . Hyperlipidemia    Starr Lake PT, DPT, LAT, ATC  12/09/19  9:31 AM      [facility] [address] [place], [place], [address] Phone: [phone]   Fax:  [phone]  Name: Galilea Quito MRN: [id number] Date of Birth: 12/09/1965

## 2019-12-17 ENCOUNTER — Other Ambulatory Visit: Payer: Self-pay

## 2019-12-17 ENCOUNTER — Encounter: Payer: Self-pay | Admitting: Orthopaedic Surgery

## 2019-12-17 ENCOUNTER — Ambulatory Visit (INDEPENDENT_AMBULATORY_CARE_PROVIDER_SITE_OTHER): Payer: Medicare HMO | Admitting: Orthopaedic Surgery

## 2019-12-17 DIAGNOSIS — Z9889 Other specified postprocedural states: Secondary | ICD-10-CM

## 2019-12-17 NOTE — Progress Notes (Signed)
The patient is now 9 weeks status post a right shoulder arthroscopic rotator cuff repair.  Things are coming along slow with her.  She feels like she is getting there and therapy is pushing her well.  She does get a sharp pain in the shoulder that is a rubber band popping sensation.  On exam I cannot elicit that same sensation with her shoulder but she certainly has weak abduction to be expected this was quite an extensive tear and repair.  I gave her reassurance that this is certainly normal at just 9 weeks after an extensive rotator cuff repair.  He can certainly take 5 to 6 months to get a good recovery and she understands that as well.  All questions and concerns were answered and addressed.  I will see her back in 4 weeks to see what progress she is making with her right shoulder.

## 2019-12-23 ENCOUNTER — Ambulatory Visit: Payer: Medicare HMO | Attending: Orthopaedic Surgery | Admitting: Physical Therapy

## 2019-12-23 ENCOUNTER — Encounter: Payer: Self-pay | Admitting: Physical Therapy

## 2019-12-23 ENCOUNTER — Other Ambulatory Visit: Payer: Self-pay

## 2019-12-23 DIAGNOSIS — M25511 Pain in right shoulder: Secondary | ICD-10-CM | POA: Diagnosis not present

## 2019-12-23 DIAGNOSIS — M79642 Pain in left hand: Secondary | ICD-10-CM

## 2019-12-23 DIAGNOSIS — M6281 Muscle weakness (generalized): Secondary | ICD-10-CM | POA: Diagnosis not present

## 2019-12-23 DIAGNOSIS — M25611 Stiffness of right shoulder, not elsewhere classified: Secondary | ICD-10-CM | POA: Diagnosis not present

## 2019-12-23 NOTE — Therapy (Signed)
[place], [place], [address] Phone: [phone]   Fax:  [phone]  Physical Therapy Treatment  Patient Details  Name: Melody Jenkins MRN: [id number] Date of Birth: Nov 03, 1965 Referring Provider (PT): Dr Jean Rosenthal   Encounter Date: 12/23/2019   PT End of Session - 12/23/19 1145    Visit Number 11    Number of Visits 18    Date for PT Re-Evaluation 01/06/20    Authorization Type Human MCR    Authorization Time Period auth sent in    Progress Note Due on Visit 20    PT Start Time 1145    PT Stop Time 1230    PT Time Calculation (min) 45 min    Activity Tolerance Patient tolerated treatment well    Behavior During Therapy [facility] for tasks assessed/performed           Past Medical History:  Diagnosis Date  . Anemia   . Anxiety   . Asthma   . Carpal tunnel syndrome   . Chronic back pain   . Depression   . History of multiple miscarriages   . Hyperlipidemia   . Torn rotator cuff     Past Surgical History:  Procedure Laterality Date  . BREAST SURGERY  2000   reduction  . CARPAL TUNNEL RELEASE     left  . CESAREAN SECTION    . COSMETIC SURGERY  2000   breast reduction  . REDUCTION MAMMAPLASTY Bilateral   . ROTATOR CUFF REPAIR  10/2019    There were no vitals filed for this visit.   Subjective Assessment - 12/23/19 1146    Subjective "I saw the MD and he helped address alot of my questions. He stated I am only 9 weeks but I need more time for healing. I do feel like I am getting better, it isn't popping any more"    Patient Stated Goals use her arm again    Currently in Pain? Yes    Pain Location Shoulder    Pain Orientation Right              [facility] PT Assessment - 12/23/19 0001      Assessment   Medical Diagnosis Rt RTC repair    Referring Provider (PT) Dr Jean Rosenthal                         [facility] Adult PT Treatment/Exercise - 12/23/19  0001      Shoulder Exercises: Supine   Protraction Strengthening;Right;12 reps   x 2 sets   Flexion Strengthening;Right;10 reps   x 2 sets     Shoulder Exercises: Seated   Row 12 reps;Strengthening;Both;Theraband    Theraband Level (Shoulder Row) Level 2 (Red)    External Rotation 12 reps;Strengthening;Right    Theraband Level (Shoulder External Rotation) Level 2 (Red)    Other Seated Exercises scapular retraction with bil ER 2 x 12 with yellow theraband      Shoulder Exercises: Sidelying   ABduction Strengthening;Right;10 reps   x 2 sets     Shoulder Exercises: ROM/Strengthening   UBE (Upper Arm Bike) L1 x 4 min   fwd/bwd x 2 min      Shoulder Exercises: Stretch   Other Shoulder Stretches upper trap stretch 2 x 30 sec    Other Shoulder Stretches levator scapuale stretch 2 x 30 seconds      Manual Therapy   Manual therapy comments MTPR  along the R upper trap/ levator scapuale, middle deltoid    Joint Mobilization PA/ AP grade III for pain with grade 1 distraction                    PT Short Term Goals - 12/09/19 0910      PT SHORT TERM GOAL #1   Title I with initial HEP    Status Achieved      PT SHORT TERM GOAL #2   Title review FOTO results and expectaions    Status Achieved      PT SHORT TERM GOAL #3   Title tolerate AAROM Rt shoulder    Status Achieved      PT SHORT TERM GOAL #4   Title PROM Rt shoulder within protocol    Period Weeks    Status Achieved             PT Long Term Goals - 12/09/19 0910      PT LONG TERM GOAL #1   Title I with advanced HEP for shoulder    Period Weeks    Status On-going      PT LONG TERM GOAL #2   Title improve FOTO =/< 48% limited    Period Weeks    Status On-going      PT LONG TERM GOAL #3   Title demo Rt shoulder AROM WFL to allow her to use the arm for ADLs    Period Weeks    Status On-going      PT LONG TERM GOAL #4   Title demo Rt UE strength =/> 4+/5 to use for light house work    Period Weeks      Status Unable to assess      PT LONG TERM GOAL #5   Title report =/> 75% reduction in Rt shoulder pain    Period Weeks    Status On-going                 Plan - 12/23/19 1231    Clinical Impression Statement Mrs stewart reported seeing her MD and notes she is doing well and on track. pt will be 10 weeks out on 12/24/2019, continued working per protocol with strengthening and AROM in supine which she did very well with today, and additionally reported decreased popping.    PT Treatment/Interventions Iontophoresis 4mg /ml Dexamethasone;Taping;Vasopneumatic Device;Patient/family education;Moist Heat;Ultrasound;Therapeutic activities;Passive range of motion;Therapeutic exercise;Cryotherapy;Electrical Stimulation;Manual techniques;Dry needling    PT Next Visit Plan pt is 10 weeks post of 12/24/2019, continued progression of ROM AAROM to AROM, shoulder strengthening as tolerated, vaso for pain    PT Home Exercise Plan [id number]: scap retract, pendulums, elbow flexion AROM, upper trap stretch, wrist flexion/extension, shoulder IR/ER and rows with yellow band, towel IR, and wall walks    Consulted and Agree with Plan of Care Patient           Patient will benefit from skilled therapeutic intervention in order to improve the following deficits and impairments:  Decreased range of motion, Obesity, Impaired UE functional use, Increased muscle spasms, Pain, Decreased strength, Increased edema  Visit Diagnosis: Stiffness of right shoulder, not elsewhere classified  Acute pain of right shoulder  Muscle weakness (generalized)  Pain in left hand     Problem List Patient Active Problem List   Diagnosis Date Noted  . Status post right rotator cuff repair 10/22/2019  . Nontraumatic complete tear of right rotator cuff 04/29/2019  . 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 Trigger index  finger of right hand 11/20/2018  . Arthritis of carpometacarpal [facility]) joint of left thumb 11/20/2018  . Vitamin D  deficiency 11/19/2018  . Chronic back pain   . Special screening for malignant neoplasms, colon 08/16/2017  . Type 2 diabetes mellitus with hyperlipidemia ([place]) 02/13/2017  . Leg swelling 02/13/2017  . Chronic constipation 11/20/2016  . Tear of MCL (medial collateral ligament) of knee, left, subsequent encounter 04/06/2016  . Anterolisthesis 04/06/2016  . Situational mixed anxiety and depressive disorder 02/26/2016  . Insomnia 02/24/2016  . Back pain with radiation 02/14/2016  . Essential hypertension, benign 12/26/2015  . DDD (degenerative disc disease), lumbar 12/26/2015  . Seasonal allergies 12/26/2015  . Obesity 04/06/2015  . Anemia   . Asthma   . Depression   . Hyperlipidemia     Starr Lake PT, DPT, LAT, ATC  12/23/19  12:34 PM      [facility] [address] [place], [place], [address] Phone: [phone]   Fax:  [phone]  Name: Melody Jenkins MRN: [id number] Date of Birth: [birth year]

## 2019-12-24 ENCOUNTER — Encounter: Payer: Self-pay | Admitting: Physical Therapy

## 2019-12-24 ENCOUNTER — Ambulatory Visit: Payer: Medicare HMO | Admitting: Physical Therapy

## 2019-12-24 DIAGNOSIS — M25511 Pain in right shoulder: Secondary | ICD-10-CM

## 2019-12-24 DIAGNOSIS — M6281 Muscle weakness (generalized): Secondary | ICD-10-CM

## 2019-12-24 DIAGNOSIS — M79642 Pain in left hand: Secondary | ICD-10-CM | POA: Diagnosis not present

## 2019-12-24 DIAGNOSIS — M25611 Stiffness of right shoulder, not elsewhere classified: Secondary | ICD-10-CM

## 2019-12-24 NOTE — Therapy (Signed)
[place], [place], [address] Phone: [phone]   Fax:  [phone]  Physical Therapy Treatment  Patient Details  Name: Melody Jenkins MRN: [id number] Date of Birth: [birth year] Referring Provider (PT): Dr Jean Rosenthal   Encounter Date: 12/24/2019   PT End of Session - 12/24/19 0936    Visit Number 12    Number of Visits 18    Date for PT Re-Evaluation 01/06/20    Authorization Type Human MCR    Authorization Time Period auth sent in    Progress Note Due on Visit 20    PT Start Time 0932    PT Stop Time 1011    PT Time Calculation (min) 39 min    Activity Tolerance Patient tolerated treatment well    Behavior During Therapy [facility] for tasks assessed/performed           Past Medical History:  Diagnosis Date  . Anemia   . Anxiety   . Asthma   . Carpal tunnel syndrome   . Chronic back pain   . Depression   . History of multiple miscarriages   . Hyperlipidemia   . Torn rotator cuff     Past Surgical History:  Procedure Laterality Date  . BREAST SURGERY  2000   reduction  . CARPAL TUNNEL RELEASE     left  . CESAREAN SECTION    . COSMETIC SURGERY  2000   breast reduction  . REDUCTION MAMMAPLASTY Bilateral   . ROTATOR CUFF REPAIR  10/2019    There were no vitals filed for this visit.   Subjective Assessment - 12/24/19 0935    Subjective " I am actually doing pretty good today. I woke up with no pain."    Currently in Pain? Yes    Pain Score 0-No pain    Pain Type Chronic pain    Aggravating Factors  driving    Pain Relieving Factors meds, ice              [facility] PT Assessment - 12/24/19 0001      Assessment   Medical Diagnosis Rt RTC repair    Referring Provider (PT) Dr Jean Rosenthal                         [facility] Adult PT Treatment/Exercise - 12/24/19 0001      Shoulder Exercises: Seated   Row Strengthening;Right;12 reps;Theraband     Theraband Level (Shoulder Row) Level 2 (Red)    Other Seated Exercises bicep curl 2 x 12 4#    Other Seated Exercises scapular retraction with bil ER 2 x 12 with yellow theraband      Shoulder Exercises: ROM/Strengthening   UBE (Upper Arm Bike) L1 x 6 min    fwd/bwd x 3 min   Other ROM/Strengthening Exercises UE ranger flexion/ abduction 2 x 10 ea focus on controlled movements    Other ROM/Strengthening Exercises towel IR pulling across the low back 1 x 15, then 1 x 10 up the back      Shoulder Exercises: Stretch   Other Shoulder Stretches R upper trap stretch 2 x 30 sec    Other Shoulder Stretches levator scapuale stretch 2 x 30 seconds                    PT Short Term Goals - 12/09/19 0910      PT SHORT TERM GOAL #1   Title  I with initial HEP    Status Achieved      PT SHORT TERM GOAL #2   Title review FOTO results and expectaions    Status Achieved      PT SHORT TERM GOAL #3   Title tolerate AAROM Rt shoulder    Status Achieved      PT SHORT TERM GOAL #4   Title PROM Rt shoulder within protocol    Period Weeks    Status Achieved             PT Long Term Goals - 12/09/19 0910      PT LONG TERM GOAL #1   Title I with advanced HEP for shoulder    Period Weeks    Status On-going      PT LONG TERM GOAL #2   Title improve FOTO =/< 48% limited    Period Weeks    Status On-going      PT LONG TERM GOAL #3   Title demo Rt shoulder AROM WFL to allow her to use the arm for ADLs    Period Weeks    Status On-going      PT LONG TERM GOAL #4   Title demo Rt UE strength =/> 4+/5 to use for light house work    Period Weeks    Status Unable to assess      PT LONG TERM GOAL #5   Title report =/> 75% reduction in Rt shoulder pain    Period Weeks    Status On-going                 Plan - 12/24/19 1004    Clinical Impression Statement pt is 10 weeks post op today. continued working on shoulder ROM and strengthening which she does requring intermittent  re-direction of focus and is able to complete exercises noting mild soreness. pt noted feeling surprised today she was able to do some of the exercises she completed today, she declined modalities at end of session.    PT Treatment/Interventions Iontophoresis 4mg /ml Dexamethasone;Taping;Vasopneumatic Device;Patient/family education;Moist Heat;Ultrasound;Therapeutic activities;Passive range of motion;Therapeutic exercise;Cryotherapy;Electrical Stimulation;Manual techniques;Dry needling    PT Next Visit Plan pt is 10 weeks post of 12/24/2019, continued progression of ROM AAROM to AROM, shoulder strengthening as tolerated, vaso for pain, updated HEP    PT Home Exercise Plan [id number]: scap retract, pendulums, elbow flexion AROM, upper trap stretch, wrist flexion/extension, shoulder IR/ER and rows with yellow band, towel IR, and wall walks           Patient will benefit from skilled therapeutic intervention in order to improve the following deficits and impairments:  Decreased range of motion, Obesity, Impaired UE functional use, Increased muscle spasms, Pain, Decreased strength, Increased edema  Visit Diagnosis: Stiffness of right shoulder, not elsewhere classified  Acute pain of right shoulder  Muscle weakness (generalized)     Problem List Patient Active Problem List   Diagnosis Date Noted  . Status post right rotator cuff repair 10/22/2019  . Nontraumatic complete tear of right rotator cuff 04/29/2019  . 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 Trigger index finger of right hand 11/20/2018  . Arthritis of carpometacarpal [facility]) joint of left thumb 11/20/2018  . Vitamin D deficiency 11/19/2018  . Chronic back pain   . Special screening for malignant neoplasms, colon 08/16/2017  . Type 2 diabetes mellitus with hyperlipidemia ([place]) 02/13/2017  . Leg swelling 02/13/2017  . Chronic constipation 11/20/2016  . Tear of MCL (medial collateral ligament) of knee, left,  subsequent encounter 04/06/2016  .  Anterolisthesis 04/06/2016  . Situational mixed anxiety and depressive disorder 02/26/2016  . Insomnia 02/24/2016  . Back pain with radiation 02/14/2016  . Essential hypertension, benign 12/26/2015  . DDD (degenerative disc disease), lumbar 12/26/2015  . Seasonal allergies 12/26/2015  . Obesity 04/06/2015  . Anemia   . Asthma   . Depression   . Hyperlipidemia    Starr Lake PT, DPT, LAT, ATC  12/24/19  10:10 AM      [facility] [address] [place], [place], [address] Phone: [phone]   Fax:  [phone]  Name: Hanae Waiters MRN: [id number] Date of Birth: 1966/05/08

## 2019-12-28 ENCOUNTER — Encounter: Payer: Medicare HMO | Admitting: Family Medicine

## 2019-12-29 ENCOUNTER — Encounter: Payer: Self-pay | Admitting: Physical Therapy

## 2019-12-29 ENCOUNTER — Ambulatory Visit: Payer: Medicare HMO | Admitting: Physical Therapy

## 2019-12-29 ENCOUNTER — Other Ambulatory Visit: Payer: Self-pay

## 2019-12-29 DIAGNOSIS — M25511 Pain in right shoulder: Secondary | ICD-10-CM | POA: Diagnosis not present

## 2019-12-29 DIAGNOSIS — M25611 Stiffness of right shoulder, not elsewhere classified: Secondary | ICD-10-CM

## 2019-12-29 DIAGNOSIS — M6281 Muscle weakness (generalized): Secondary | ICD-10-CM | POA: Diagnosis not present

## 2019-12-29 DIAGNOSIS — M79642 Pain in left hand: Secondary | ICD-10-CM | POA: Diagnosis not present

## 2019-12-29 NOTE — Therapy (Signed)
[place], [place], [address] Phone: [phone]   Fax:  [phone]  Physical Therapy Treatment  Patient Details  Name: Melody Jenkins MRN: [id number] Date of Birth: 1965/06/23 Referring Provider (PT): Dr Jean Rosenthal   Encounter Date: 12/29/2019   PT End of Session - 12/29/19 0847    Visit Number 13    Number of Visits 18    Date for PT Re-Evaluation 01/06/20    Authorization Type Human MCR    Authorization Time Period auth sent in    Progress Note Due on Visit 20    PT Start Time 0847    PT Stop Time 0928    PT Time Calculation (min) 41 min    Activity Tolerance Patient tolerated treatment well    Behavior During Therapy [facility] for tasks assessed/performed           Past Medical History:  Diagnosis Date   Anemia    Anxiety    Asthma    Carpal tunnel syndrome    Chronic back pain    Depression    History of multiple miscarriages    Hyperlipidemia    Torn rotator cuff     Past Surgical History:  Procedure Laterality Date   BREAST SURGERY  2000   reduction   CARPAL TUNNEL RELEASE     left   CESAREAN SECTION     COSMETIC SURGERY  2000   breast reduction   REDUCTION MAMMAPLASTY Bilateral    ROTATOR CUFF REPAIR  10/2019    There were no vitals filed for this visit.   Subjective Assessment - 12/29/19 0847    Subjective "I am feeling pretty good. I am about about a 5/10 today in the front of the shoulder. I get clicking int he shoulder which makes me jump and causes pain".    Currently in Pain? Yes    Pain Score 5     Pain Location Shoulder    Pain Orientation Right    Pain Onset More than a month ago              [facility] PT Assessment - 12/29/19 0001      Assessment   Medical Diagnosis Rt RTC repair    Referring Provider (PT) Dr Jean Rosenthal      Observation/Other Assessments   Focus on Therapeutic Outcomes (FOTO)  40% limited                          [facility] Adult PT Treatment/Exercise - 12/29/19 0852      Shoulder Exercises: Standing   External Rotation Strengthening;10 reps;Theraband    Theraband Level (Shoulder External Rotation) Level 2 (Red)    Internal Rotation Strengthening;10 reps;Theraband    Theraband Level (Shoulder Internal Rotation) Level 2 (Red)    Flexion Strengthening;Right;10 reps    Flexion Limitations reaching in to lower cabinet flexion 1 set and middle cabinet x 1 set    ABduction Strengthening;Right;10 reps   x 2 sets   ABduction Limitations reaching in to lower shelf of cabinet.      Shoulder Exercises: ROM/Strengthening   UBE (Upper Arm Bike) L1 x 6 min    fwd/bwd x 3 min ea.   Wall Pushups 10 reps   x 2 sets   Other ROM/Strengthening Exercises UE ranger flexion/ abduction 2 x 10 ea focus on controlled movements      Shoulder Exercises: Stretch   Other Shoulder  Stretches R upper trap stretch 2 x 30 sec    Other Shoulder Stretches levator scapuale stretch 2 x 30 seconds                  PT Education - 12/29/19 0903    Education Details reviewed FOTO score / reassesment. reviewed exercise with reaching into the cabinet for functional lifting at home 2 x 10 with flexion/ abduction    Person(s) Educated Patient    Methods Explanation;Verbal cues;Handout    Comprehension Verbalized understanding;Verbal cues required            PT Short Term Goals - 12/09/19 0910      PT SHORT TERM GOAL #1   Title I with initial HEP    Status Achieved      PT SHORT TERM GOAL #2   Title review FOTO results and expectaions    Status Achieved      PT SHORT TERM GOAL #3   Title tolerate AAROM Rt shoulder    Status Achieved      PT SHORT TERM GOAL #4   Title PROM Rt shoulder within protocol    Period Weeks    Status Achieved             PT Long Term Goals - 12/09/19 0910      PT LONG TERM GOAL #1   Title I with advanced HEP for shoulder    Period Weeks    Status  On-going      PT LONG TERM GOAL #2   Title improve FOTO =/< 48% limited    Period Weeks    Status On-going      PT LONG TERM GOAL #3   Title demo Rt shoulder AROM WFL to allow her to use the arm for ADLs    Period Weeks    Status On-going      PT LONG TERM GOAL #4   Title demo Rt UE strength =/> 4+/5 to use for light house work    Period Weeks    Status Unable to assess      PT LONG TERM GOAL #5   Title report =/> 75% reduction in Rt shoulder pain    Period Weeks    Status On-going                 Plan - 12/29/19 0916    Clinical Impression Statement pt is making progress with ROM and strengthening but does continue require intermittent cues for redirection of focus to reduce apprehension of pain. She was able to complete all exercises today with reporting no increased pain / soreness end of session.    PT Treatment/Interventions Iontophoresis 4mg /ml Dexamethasone;Taping;Vasopneumatic Device;Patient/family education;Moist Heat;Ultrasound;Therapeutic activities;Passive range of motion;Therapeutic exercise;Cryotherapy;Electrical Stimulation;Manual techniques;Dry needling    PT Next Visit Plan pt is 10 weeks post of 12/24/2019, continued progression of ROM AAROM to AROM, shoulder strengthening as tolerated, vaso for pain, updated HEP, update HEP for bicep/ tricep strengthening.    PT Home Exercise Plan [id number]: scap retract, pendulums, elbow flexion AROM, upper trap stretch, wrist flexion/extension, shoulder IR/ER and rows with yellow band, towel IR, and wall walks, reaching into cabinet flexion/ abduction    Consulted and Agree with Plan of Care Patient           Patient will benefit from skilled therapeutic intervention in order to improve the following deficits and impairments:  Decreased range of motion, Obesity, Impaired UE functional use, Increased muscle spasms, Pain, Decreased strength, Increased edema  Visit Diagnosis: Stiffness of right shoulder, not elsewhere  classified  Acute pain of right shoulder  Muscle weakness (generalized)     Problem List Patient Active Problem List   Diagnosis Date Noted   Status post right rotator cuff repair 10/22/2019   Nontraumatic complete tear of right rotator cuff 04/29/2019   Wrist arthritis 11/20/2018   Trigger index finger of right hand 11/20/2018   Arthritis of carpometacarpal (CMC) joint of left thumb 11/20/2018   Vitamin D deficiency 11/19/2018   Chronic back pain    Special screening for malignant neoplasms, colon 08/16/2017   Type 2 diabetes mellitus with hyperlipidemia ([place]) 02/13/2017   Leg swelling 02/13/2017   Chronic constipation 11/20/2016   Tear of MCL (medial collateral ligament) of knee, left, subsequent encounter 04/06/2016   Anterolisthesis 04/06/2016   Situational mixed anxiety and depressive disorder 02/26/2016   Insomnia 02/24/2016   Back pain with radiation 02/14/2016   Essential hypertension, benign 12/26/2015   DDD (degenerative disc disease), lumbar 12/26/2015   Seasonal allergies 12/26/2015   Obesity 04/06/2015   Anemia    Asthma    Depression    Hyperlipidemia    Muad Noga PT, DPT, LAT, ATC  12/29/19  9:31 AM      [facility] [address] [place], [place], [address] Phone: [phone]   Fax:  [phone]  Name: Melody Jenkins MRN: [id number] Date of Birth: February 27, 1966

## 2019-12-31 ENCOUNTER — Other Ambulatory Visit: Payer: Self-pay

## 2019-12-31 ENCOUNTER — Ambulatory Visit: Payer: Medicare HMO | Admitting: Physical Therapy

## 2019-12-31 ENCOUNTER — Ambulatory Visit: Payer: Medicare Other | Admitting: Obstetrics and Gynecology

## 2019-12-31 DIAGNOSIS — M25511 Pain in right shoulder: Secondary | ICD-10-CM

## 2019-12-31 DIAGNOSIS — M6281 Muscle weakness (generalized): Secondary | ICD-10-CM

## 2019-12-31 DIAGNOSIS — M25611 Stiffness of right shoulder, not elsewhere classified: Secondary | ICD-10-CM

## 2019-12-31 DIAGNOSIS — M79642 Pain in left hand: Secondary | ICD-10-CM | POA: Diagnosis not present

## 2019-12-31 NOTE — Therapy (Signed)
[place], [place], [address] Phone: [phone]   Fax:  [phone]  Physical Therapy Treatment  Patient Details  Name: Melody Jenkins MRN: [id number] Date of Birth: 08-Nov-1965 Referring Provider (PT): Dr Jean Rosenthal   Encounter Date: 12/31/2019   PT End of Session - 12/31/19 0849    Visit Number 14    Number of Visits 18    Date for PT Re-Evaluation 01/06/20    Authorization Type Human MCR    Progress Note Due on Visit 20    PT Start Time 0846    PT Stop Time 0926    PT Time Calculation (min) 40 min    Activity Tolerance Patient tolerated treatment well    Behavior During Therapy [facility] for tasks assessed/performed           Past Medical History:  Diagnosis Date   Anemia    Anxiety    Asthma    Carpal tunnel syndrome    Chronic back pain    Depression    History of multiple miscarriages    Hyperlipidemia    Torn rotator cuff     Past Surgical History:  Procedure Laterality Date   BREAST SURGERY  2000   reduction   CARPAL TUNNEL RELEASE     left   CESAREAN SECTION     COSMETIC SURGERY  2000   breast reduction   REDUCTION MAMMAPLASTY Bilateral    ROTATOR CUFF REPAIR  10/2019    There were no vitals filed for this visit.   Subjective Assessment - 12/31/19 0852    Subjective "I am doing okay, pain is maybe a 3/10, of course until I do something."    Currently in Pain? Yes    Pain Score 3     Pain Orientation Right    Pain Descriptors / Indicators Aching    Aggravating Factors  increased activity with R shoulder    Pain Relieving Factors meds, ice              [facility] PT Assessment - 12/31/19 0001      Assessment   Medical Diagnosis Rt RTC repair    Referring Provider (PT) Dr Jean Rosenthal                         [facility] Adult PT Treatment/Exercise - 12/31/19 0001      Shoulder Exercises: Standing   External Rotation  Strengthening;Theraband;15 reps    2 sets   Theraband Level (Shoulder External Rotation) Level 2 (Red)    Internal Rotation Strengthening;Theraband;15 reps    Theraband Level (Shoulder Internal Rotation) Level 2 (Red)    Flexion Strengthening;Both   scaption, no resistance 2 x 8 reps    Flexion Limitations cues for proper form and redirectio of focus    Row Strengthening;15 reps;Theraband    Theraband Level (Shoulder Row) Level 2 (Red)    Other Standing Exercises --    Other Standing Exercises bicep curl, and tricep extension 2 x 10 with red theraband      Shoulder Exercises: ROM/Strengthening   UBE (Upper Arm Bike) L3 x 6 min    fwd/bwd x 3 min     Shoulder Exercises: Stretch   Other Shoulder Stretches R upper trap stretch 2 x 30 sec    Other Shoulder Stretches levator scapuale stretch 2 x 30 seconds  PT Education - 12/31/19 0909    Education Details updated HEP for bicep/ tricep strengthening in standing    Person(s) Educated Patient    Methods Explanation;Verbal cues;Handout    Comprehension Verbalized understanding;Verbal cues required            PT Short Term Goals - 12/09/19 0910      PT SHORT TERM GOAL #1   Title I with initial HEP    Status Achieved      PT SHORT TERM GOAL #2   Title review FOTO results and expectaions    Status Achieved      PT SHORT TERM GOAL #3   Title tolerate AAROM Rt shoulder    Status Achieved      PT SHORT TERM GOAL #4   Title PROM Rt shoulder within protocol    Period Weeks    Status Achieved             PT Long Term Goals - 12/09/19 0910      PT LONG TERM GOAL #1   Title I with advanced HEP for shoulder    Period Weeks    Status On-going      PT LONG TERM GOAL #2   Title improve FOTO =/< 48% limited    Period Weeks    Status On-going      PT LONG TERM GOAL #3   Title demo Rt shoulder AROM WFL to allow her to use the arm for ADLs    Period Weeks    Status On-going      PT LONG TERM GOAL #4    Title demo Rt UE strength =/> 4+/5 to use for light house work    Period Weeks    Status Unable to assess      PT LONG TERM GOAL #5   Title report =/> 75% reduction in Rt shoulder pain    Period Weeks    Status On-going                 Plan - 12/31/19 0919    Clinical Impression Statement continued progressive loading of shoulder with increased reps to promote stability as well as endurnace. pt noted intermittent soreness likely related to fatigue. progressed HEP to standing bicep/ tricep strengthening. she performed all exercise well noting no increased pain/ soreness at end of session.    PT Treatment/Interventions Iontophoresis 4mg /ml Dexamethasone;Taping;Vasopneumatic Device;Patient/family education;Moist Heat;Ultrasound;Therapeutic activities;Passive range of motion;Therapeutic exercise;Cryotherapy;Electrical Stimulation;Manual techniques;Dry needling    PT Next Visit Plan pt is 11 weeks post of 12/31/2019, continued progression of ROM AAROM to AROM, shoulder strengthening as tolerated, vaso for pain, updated HEP,    PT Home Exercise Plan [id number]: scap retract, pendulums, elbow flexion AROM, upper trap stretch, wrist flexion/extension, shoulder IR/ER and rows with yellow band, towel IR, and wall walks, reaching into cabinet flexion/ abduction, standing bicep/ tricep strengthening.    Consulted and Agree with Plan of Care Patient           Patient will benefit from skilled therapeutic intervention in order to improve the following deficits and impairments:  Decreased range of motion, Obesity, Impaired UE functional use, Increased muscle spasms, Pain, Decreased strength, Increased edema  Visit Diagnosis: Stiffness of right shoulder, not elsewhere classified  Acute pain of right shoulder  Muscle weakness (generalized)     Problem List Patient Active Problem List   Diagnosis Date Noted   Status post right rotator cuff repair 10/22/2019   Nontraumatic complete tear  of right rotator  cuff 04/29/2019   Wrist arthritis 11/20/2018   Trigger index finger of right hand 11/20/2018   Arthritis of carpometacarpal [facility]) joint of left thumb 11/20/2018   Vitamin D deficiency 11/19/2018   Chronic back pain    Special screening for malignant neoplasms, colon 08/16/2017   Type 2 diabetes mellitus with hyperlipidemia ([place]) 02/13/2017   Leg swelling 02/13/2017   Chronic constipation 11/20/2016   Tear of MCL (medial collateral ligament) of knee, left, subsequent encounter 04/06/2016   Anterolisthesis 04/06/2016   Situational mixed anxiety and depressive disorder 02/26/2016   Insomnia 02/24/2016   Back pain with radiation 02/14/2016   Essential hypertension, benign 12/26/2015   DDD (degenerative disc disease), lumbar 12/26/2015   Seasonal allergies 12/26/2015   Obesity 04/06/2015   Anemia    Asthma    Depression    Hyperlipidemia    Vivia Rosenburg PT, DPT, LAT, ATC  12/31/19  9:27 AM      [place] [facility] [address] [place], [place], [address] Phone: [phone]   Fax:  [phone]  Name: Lilinoe Acklin MRN: [id number] Date of Birth: Jul 23, 1965

## 2020-01-05 ENCOUNTER — Ambulatory Visit: Payer: Medicare HMO | Admitting: Physical Therapy

## 2020-01-07 ENCOUNTER — Ambulatory Visit: Payer: Medicare HMO | Admitting: Physical Therapy

## 2020-01-07 ENCOUNTER — Other Ambulatory Visit: Payer: Self-pay

## 2020-01-07 ENCOUNTER — Encounter: Payer: Self-pay | Admitting: Physical Therapy

## 2020-01-07 DIAGNOSIS — M79642 Pain in left hand: Secondary | ICD-10-CM | POA: Diagnosis not present

## 2020-01-07 DIAGNOSIS — M25511 Pain in right shoulder: Secondary | ICD-10-CM

## 2020-01-07 DIAGNOSIS — M25611 Stiffness of right shoulder, not elsewhere classified: Secondary | ICD-10-CM

## 2020-01-07 DIAGNOSIS — M6281 Muscle weakness (generalized): Secondary | ICD-10-CM

## 2020-01-07 NOTE — Therapy (Signed)
[place], [place], [address] Phone: [phone]   Fax:  [phone]  Physical Therapy Treatment / Re-certification  Patient Details  Name: Melody Jenkins MRN: [id number] Date of Birth: 03-24-1966 Referring Provider (PT): Dr Jean Rosenthal   Encounter Date: 01/07/2020   PT End of Session - 01/07/20 0927    Visit Number 15    Number of Visits 18    Date for PT Re-Evaluation 01/27/20    Authorization Type Human MCR    Authorization Time Period auth sent in    Progress Note Due on Visit 20    PT Start Time 0845    PT Stop Time 0930    PT Time Calculation (min) 45 min    Activity Tolerance Patient tolerated treatment well    Behavior During Therapy [facility] for tasks assessed/performed           Past Medical History:  Diagnosis Date   Anemia    Anxiety    Asthma    Carpal tunnel syndrome    Chronic back pain    Depression    History of multiple miscarriages    Hyperlipidemia    Torn rotator cuff     Past Surgical History:  Procedure Laterality Date   BREAST SURGERY  2000   reduction   CARPAL TUNNEL RELEASE     left   CESAREAN SECTION     COSMETIC SURGERY  2000   breast reduction   REDUCTION MAMMAPLASTY Bilateral    ROTATOR CUFF REPAIR  10/2019    There were no vitals filed for this visit.   Subjective Assessment - 01/07/20 0849    Subjective " I am doing fine, only issue is when I am driving but it gradually goes away."    Currently in Pain? Yes    Pain Score 0-No pain    Aggravating Factors  driving    Pain Relieving Factors meds, ice              [facility] PT Assessment - 01/07/20 0001      Assessment   Medical Diagnosis Rt RTC repair    Referring Provider (PT) Dr Jean Rosenthal    Next MD Visit 01/14/2020      AROM   Right Shoulder Extension 50 Degrees    Right Shoulder Flexion 129 Degrees    Right Shoulder ABduction 101 Degrees    Right  Shoulder Internal Rotation --   T10   Right Shoulder External Rotation --   T1     Strength   Right Shoulder Flexion 4-/5    Right Shoulder Extension 4+/5    Right Shoulder ABduction 4-/5    Right Shoulder Internal Rotation 4/5    Right Shoulder External Rotation 4-/5                         [facility] Adult PT Treatment/Exercise - 01/07/20 0001      Shoulder Exercises: Standing   External Rotation Strengthening;Theraband;20 reps    Theraband Level (Shoulder External Rotation) Level 2 (Red)    Internal Rotation Strengthening;Right;20 reps;Theraband    Theraband Level (Shoulder Internal Rotation) Level 2 (Red)    Flexion Strengthening;Right;10 reps   x 2 sets   Shoulder Flexion Weight (lbs) 1    Flexion Limitations DC'd 1# weight and resumed exercise without weight    Extension Strengthening;Both;20 reps;Theraband   x 2 sets   Theraband Level (Shoulder Extension) Level 3 (Green)  Row Strengthening;Theraband;20 reps   x 2 sets   Theraband Level (Shoulder Row) Level 3 (Green)    Other Standing Exercises wall wash at shoulder height flexion, abduction 2 x 12      Shoulder Exercises: ROM/Strengthening   UBE (Upper Arm Bike) L4 x 6 min    FWD/ BWD x 3 min   Other ROM/Strengthening Exercises wall walks abduction with hand off the wall 1 x 10    for abduction strengthening focus on eccentric loading     Shoulder Exercises: Stretch   Other Shoulder Stretches R upper trap stretch 2 x 30 sec    Other Shoulder Stretches levator scapuale stretch 2 x 30 seconds                  PT Education - 01/07/20 0916    Education Details Reviewed HEP and discussed updated POC    Person(s) Educated Patient    Methods Explanation;Verbal cues    Comprehension Verbalized understanding;Verbal cues required            PT Short Term Goals - 12/09/19 0910      PT SHORT TERM GOAL #1   Title I with initial HEP    Status Achieved      PT SHORT TERM GOAL #2   Title review FOTO  results and expectaions    Status Achieved      PT SHORT TERM GOAL #3   Title tolerate AAROM Rt shoulder    Status Achieved      PT SHORT TERM GOAL #4   Title PROM Rt shoulder within protocol    Period Weeks    Status Achieved             PT Long Term Goals - 01/07/20 0850      PT LONG TERM GOAL #1   Title I with advanced HEP for shoulder    Period Weeks      PT LONG TERM GOAL #2   Title improve FOTO =/< 48% limited    Period Weeks    Status Unable to assess      PT LONG TERM GOAL #3   Title demo Rt shoulder AROM WFL to allow her to use the arm for ADLs    Period Weeks    Status Partially Met      PT LONG TERM GOAL #4   Title demo Rt UE strength =/> 4+/5 to use for light house work    Period Weeks    Status On-going      PT LONG TERM GOAL #5   Title report =/> 75% reduction in Rt shoulder pain    Period Weeks    Status Achieved                 Plan - 01/07/20 0912    Clinical Impression Statement Melody Jenkins continues to make great progress with physical therapy increasing shoulder ROM and strength. She continues to report intermittent fatigue with repetitive motions which we discussed is normal. She is progressing appropriately toward her goals. Will continue with current treatment plan dropping to 1 x a week for the next 3 weeks to work on progressing fucntional strength/ mobility, increase HEP and work to ind exercise.    PT Frequency 1x / week    PT Duration 3 weeks    PT Treatment/Interventions Iontophoresis 13m/ml Dexamethasone;Taping;Vasopneumatic Device;Patient/family education;Moist Heat;Ultrasound;Therapeutic activities;Passive range of motion;Therapeutic exercise;Cryotherapy;Electrical Stimulation;Manual techniques;Dry needling    PT Next Visit Plan pt is 12 weeks  post of 01/07/2020, continued progression of ROM AAROM to AROM, shoulder strengthening as tolerated, vaso for pain, updated HEP,    PT Home Exercise Plan [id number]: scap retract, pendulums,  elbow flexion AROM, upper trap stretch, wrist flexion/extension, shoulder IR/ER and rows with yellow band, towel IR, and wall walks, reaching into cabinet flexion/ abduction, standing bicep/ tricep strengthening.    Consulted and Agree with Plan of Care Patient           Patient will benefit from skilled therapeutic intervention in order to improve the following deficits and impairments:  Decreased range of motion, Obesity, Impaired UE functional use, Increased muscle spasms, Pain, Decreased strength, Increased edema  Visit Diagnosis: Stiffness of right shoulder, not elsewhere classified - Plan: PT plan of care cert/re-cert  Acute pain of right shoulder - Plan: PT plan of care cert/re-cert  Muscle weakness (generalized) - Plan: PT plan of care cert/re-cert     Problem List Patient Active Problem List   Diagnosis Date Noted   Status post right rotator cuff repair 10/22/2019   Nontraumatic complete tear of right rotator cuff 04/29/2019   Wrist arthritis 11/20/2018   Trigger index finger of right hand 11/20/2018   Arthritis of carpometacarpal ([place]) joint of left thumb 11/20/2018   Vitamin D deficiency 11/19/2018   Chronic back pain    Special screening for malignant neoplasms, colon 08/16/2017   Type 2 diabetes mellitus with hyperlipidemia ([place]) 02/13/2017   Leg swelling 02/13/2017   Chronic constipation 11/20/2016   Tear of MCL (medial collateral ligament) of knee, left, subsequent encounter 04/06/2016   Anterolisthesis 04/06/2016   Situational mixed anxiety and depressive disorder 02/26/2016   Insomnia 02/24/2016   Back pain with radiation 02/14/2016   Essential hypertension, benign 12/26/2015   DDD (degenerative disc disease), lumbar 12/26/2015   Seasonal allergies 12/26/2015   Obesity 04/06/2015   Anemia    Asthma    Depression    Hyperlipidemia     Sameul Tagle PT, DPT, LAT, ATC  01/07/20  9:32 AM      [facility] [address] [place], [place], [address] Phone: [phone]   Fax:  [phone]  Name: Melody Jenkins MRN: [id number] Date of Birth: [birth year]

## 2020-01-12 ENCOUNTER — Ambulatory Visit: Payer: Medicare HMO | Admitting: Physical Therapy

## 2020-01-13 ENCOUNTER — Other Ambulatory Visit: Payer: Self-pay

## 2020-01-13 ENCOUNTER — Ambulatory Visit (INDEPENDENT_AMBULATORY_CARE_PROVIDER_SITE_OTHER): Payer: Medicare HMO | Admitting: Family Medicine

## 2020-01-13 ENCOUNTER — Encounter: Payer: Self-pay | Admitting: Family Medicine

## 2020-01-13 VITALS — BP 140/82 | HR 82 | Temp 97.9°F | Resp 14 | Ht 69.0 in | Wt 224.0 lb

## 2020-01-13 DIAGNOSIS — Z0000 Encounter for general adult medical examination without abnormal findings: Secondary | ICD-10-CM

## 2020-01-13 DIAGNOSIS — Z0289 Encounter for other administrative examinations: Secondary | ICD-10-CM

## 2020-01-13 DIAGNOSIS — I10 Essential (primary) hypertension: Secondary | ICD-10-CM | POA: Diagnosis not present

## 2020-01-13 DIAGNOSIS — Z6835 Body mass index (BMI) 35.0-35.9, adult: Secondary | ICD-10-CM

## 2020-01-13 DIAGNOSIS — Z0001 Encounter for general adult medical examination with abnormal findings: Secondary | ICD-10-CM

## 2020-01-13 DIAGNOSIS — E559 Vitamin D deficiency, unspecified: Secondary | ICD-10-CM

## 2020-01-13 DIAGNOSIS — E785 Hyperlipidemia, unspecified: Secondary | ICD-10-CM

## 2020-01-13 DIAGNOSIS — E782 Mixed hyperlipidemia: Secondary | ICD-10-CM | POA: Diagnosis not present

## 2020-01-13 DIAGNOSIS — E1169 Type 2 diabetes mellitus with other specified complication: Secondary | ICD-10-CM | POA: Diagnosis not present

## 2020-01-13 NOTE — Progress Notes (Signed)
Subjective:   Patient presents for Medicare Annual/Subsequent preventive examination.    Pt here for wellness visit, meds and history reviewed  DM- due for A1C, last 6.2% which was much improved from 70.3% in Jan  ,Trulicity 5.00 mg weekly , CBG average 113 but only 6 readings over the past 90 days   No hypoglycemia symptoms She stopped Metformin IN aPRIL, doesn't want to be on th emedication , she had N/V    Hyperlipidemia taking Crestor 10mg ,   HTN- taking BP meds as prescribed, no chest pain, no SOB, Toprol, clonidine and lasix   Follows with behavioral health- no recent changes, still on lithium  Chronic pain- per ortho and pain management Dr. Brien Few  she is still in PT from her right rotator cuff surgery   has f/u appt tomorrow   taking oxycodone- acetomiophen, gabapentin, flexeril and mobic BID  Obesity weight 227lbs in March today 224lbs   GYN appt is next week    Review Past Medical/Family/Social: Per EMR   Risk Factors  Current exercise habits: none due to ortho issues/pain  Dietary issues discussed: YES  Cardiac risk factors: Obesity (BMI >= 30 kg/m2). Dm, htn , HLD  Depression Screen  (Note: if answer to either of the following is "Yes", a more complete depression screening is indicated)  Over the past two weeks, have you felt down, depressed or hopeless? No Over the past two weeks, have you felt little interest or pleasure in doing things? No Have you lost interest or pleasure in daily life? No Do you often feel hopeless? No Do you cry easily over simple problems? No   Activities of Daily Living  In your present state of health, do you have any difficulty performing the following activities?:  Driving? No  Managing money? No  Feeding yourself? No  Getting from bed to chair? No  Climbing a flight of stairs? Yes Preparing food and eating?: Sometimes   Bathing or showering? Sometimes  Getting dressed: No  Getting to the toilet? No  Using the toilet:No   Moving around from place to place: No  In the past year have you fallen or had a near fall?:No  Are you sexually active? No  Do you have more than one partner? No   Hearing Difficulties: No  Do you often ask people to speak up or repeat themselves? No  Do you experience ringing or noises in your ears? No Do you have difficulty understanding soft or whispered voices? No  Do you feel that you have a problem with memory? No Do you often misplace items? No  Do you feel safe at home? Yes  Cognitive Testing  Alert? Yes Normal Appearance?Yes  Oriented to person? Yes Place? Yes  Time? Yes  Recall of three objects? Yes  Can perform simple calculations? Yes  Displays appropriate judgment?Yes  Can read the correct time from a watch face?Yes   List the Names of Other Physician/Practitioners you currently use:  GYN- Dr. Talbert Nan Dr. Ninfa Linden- Orthopedics Dr. Havery Moros- [facility] Behavior[facility]-  Pain Management- Dr. Brien Few  Screening Tests / Date Colonoscopy  UTD 2019                   Shingles vaccine- DUE  Mammogram  UTD Dec 2020  Influenza Vaccine  UTD  Tetanus/tdap UTD PAP Smear UTD 2020  COVID-19  UTD PNA 23- UTD    ROS: GEN- denies fatigue, fever, weight loss,weakness, recent illness HEENT- denies eye drainage, change in vision, nasal  discharge, CVS- denies chest pain, palpitations RESP- denies SOB, cough, wheeze ABD- denies N/V, change in stools, abd pain GU- denies dysuria, hematuria, dribbling, incontinence MSK- + joint pain, muscle aches, injury Neuro- denies headache, dizziness, syncope, seizure activity  Physical: Vitals reviewed, walks with cane  GEN- NAD, alert and oriented x3 HEENT- PERRL, EOMI, non injected sclera, pink conjunctiva, MMM, oropharynx clear, TM Clear no effusion Neck- Supple, no thryomegaly CVS- RRR, no murmur RESP-CTAB ABD-NABS,sof,t NT,ND  PSYCH- normal affect and mood  EXT- pedal  edema Pulses- Radial, DP- 2+   Assessment:    Annual  wellness medicare exam   Plan:    During the course of the visit the patient was educated and counseled about appropriate screening and preventive services including:   Immunizations- declines shingles vaccine at this time   Mammogram  Due in December 2021   HTN- BP a little elevated but she has not taken any meds including her diuiretic, resume once she gets home   DM- she discontinued MTF, taking trulicity, discussed importance of checking CBG regulary, recommend she take 3 times a week  check A1C goal < 7%  she would like injection in her back for her pain, will need to see what blood sugar looks like    F/U specialiast as scheduled   Advanced directives given   Pt has aide through [facility]            Diet review for nutrition referral? Yes ____ Not Indicated __x__  Patient Instructions (the written plan) was given to the patient.  Medicare Attestation  I have personally reviewed:  The patient's medical and social history  Their use of alcohol, tobacco or illicit drugs  Their current medications and supplements  The patient's functional ability including ADLs,fall risks, home safety risks, cognitive, and hearing and visual impairment  Diet and physical activities  Evidence for depression or mood disorders  The patient's weight, height, BMI, and visual acuity have been recorded in the chart. I have made referrals, counseling, and provided education to the patient based on review of the above and I have provided the patient with a written personalized care plan for preventive services.

## 2020-01-13 NOTE — Patient Instructions (Signed)
F/U 4 months  

## 2020-01-14 ENCOUNTER — Encounter: Payer: Self-pay | Admitting: Orthopaedic Surgery

## 2020-01-14 ENCOUNTER — Ambulatory Visit (INDEPENDENT_AMBULATORY_CARE_PROVIDER_SITE_OTHER): Payer: Medicare HMO | Admitting: Orthopaedic Surgery

## 2020-01-14 ENCOUNTER — Ambulatory Visit: Payer: Medicare HMO | Admitting: Physical Therapy

## 2020-01-14 DIAGNOSIS — Z9889 Other specified postprocedural states: Secondary | ICD-10-CM

## 2020-01-14 LAB — COMPREHENSIVE METABOLIC PANEL
AG Ratio: 1.4 (calc) (ref 1.0–2.5)
ALT: 12 U/L (ref 6–29)
AST: 13 U/L (ref 10–35)
Albumin: 3.9 g/dL (ref 3.6–5.1)
Alkaline phosphatase (APISO): 72 U/L (ref 37–153)
BUN: 15 mg/dL (ref 7–25)
CO2: 28 mmol/L (ref 20–32)
Calcium: 9.2 mg/dL (ref 8.6–10.4)
Chloride: 105 mmol/L (ref 98–110)
Creat: 0.84 mg/dL (ref 0.50–1.05)
Globulin: 2.8 g/dL (calc) (ref 1.9–3.7)
Glucose, Bld: 88 mg/dL (ref 65–99)
Potassium: 4.3 mmol/L (ref 3.5–5.3)
Sodium: 141 mmol/L (ref 135–146)
Total Bilirubin: 0.5 mg/dL (ref 0.2–1.2)
Total Protein: 6.7 g/dL (ref 6.1–8.1)

## 2020-01-14 LAB — CBC WITH DIFFERENTIAL/PLATELET
Absolute Monocytes: 443 cells/uL (ref 200–950)
Basophils Absolute: 47 cells/uL (ref 0–200)
Basophils Relative: 0.8 %
Eosinophils Absolute: 142 cells/uL (ref 15–500)
Eosinophils Relative: 2.4 %
HCT: 43.2 % (ref 35.0–45.0)
Hemoglobin: 13.5 g/dL (ref 11.7–15.5)
Lymphs Abs: 3068 cells/uL (ref 850–3900)
MCH: 26.5 pg — ABNORMAL LOW (ref 27.0–33.0)
MCHC: 31.3 g/dL — ABNORMAL LOW (ref 32.0–36.0)
MCV: 84.7 fL (ref 80.0–100.0)
MPV: 10.6 fL (ref 7.5–12.5)
Monocytes Relative: 7.5 %
Neutro Abs: 2201 cells/uL (ref 1500–7800)
Neutrophils Relative %: 37.3 %
Platelets: 233 10*3/uL (ref 140–400)
RBC: 5.1 10*6/uL (ref 3.80–5.10)
RDW: 14.3 % (ref 11.0–15.0)
Total Lymphocyte: 52 %
WBC: 5.9 10*3/uL (ref 3.8–10.8)

## 2020-01-14 LAB — VITAMIN D 25 HYDROXY (VIT D DEFICIENCY, FRACTURES): Vit D, 25-Hydroxy: 49 ng/mL (ref 30–100)

## 2020-01-14 LAB — LIPID PANEL
Cholesterol: 147 mg/dL (ref <200)
HDL: 40 mg/dL — ABNORMAL LOW (ref >=50)
LDL Cholesterol (Calc): 88 mg/dL (calc)
Non-HDL Cholesterol (Calc): 107 mg/dL (calc) (ref <130)
Total CHOL/HDL Ratio: 3.7 (calc) (ref <5.0)
Triglycerides: 91 mg/dL (ref <150)

## 2020-01-14 LAB — MICROALBUMIN / CREATININE URINE RATIO
Creatinine, Urine: 133 mg/dL (ref 20–275)
Microalb Creat Ratio: 2 mcg/mg creat (ref <30)
Microalb, Ur: 0.3 mg/dL

## 2020-01-14 LAB — HEMOGLOBIN A1C
Hgb A1c MFr Bld: 5.8 % of total Hgb — ABNORMAL HIGH (ref <5.7)
Mean Plasma Glucose: 120 (calc)
eAG (mmol/L): 6.6 (calc)

## 2020-01-14 NOTE — Progress Notes (Signed)
The patient is now 13 weeks status post an arthroscopic rotator cuff repair of the right shoulder.  She is doing well overall and reports good progress.  The physical therapy notes that I reviewed also reported good progress.  She states that they would like to work with her about 3 more weeks and I think this is absolutely reasonable and necessary.  On exam she is able to abduct her right shoulder much more easily and you can tell she is using her rotator cuff to abduct her shoulder.  She is improved on her abduction and forward flexion as well as external rotation.  She is showing integrity of the cuff on stressing the shoulder and strength exam.  At this point follow-up can be as needed for her shoulder.  There is nothing else I have recommended given her being as careful as possible with the shoulder and knowing that it will take some time for full recovery but I am very pleased thus far as is she.  All question concerns were answered and addressed.  If she needs anything else that anytime she has come see [place].

## 2020-01-18 ENCOUNTER — Encounter: Payer: Self-pay | Admitting: Family Medicine

## 2020-01-21 ENCOUNTER — Telehealth (HOSPITAL_COMMUNITY): Payer: Medicare HMO | Admitting: Psychiatry

## 2020-01-21 ENCOUNTER — Other Ambulatory Visit: Payer: Self-pay

## 2020-01-21 DIAGNOSIS — M5440 Lumbago with sciatica, unspecified side: Secondary | ICD-10-CM | POA: Diagnosis not present

## 2020-01-21 DIAGNOSIS — Z79899 Other long term (current) drug therapy: Secondary | ICD-10-CM | POA: Diagnosis not present

## 2020-01-21 DIAGNOSIS — F1120 Opioid dependence, uncomplicated: Secondary | ICD-10-CM | POA: Diagnosis not present

## 2020-01-21 DIAGNOSIS — M5416 Radiculopathy, lumbar region: Secondary | ICD-10-CM | POA: Diagnosis not present

## 2020-01-22 ENCOUNTER — Ambulatory Visit: Payer: Medicare HMO | Attending: Orthopaedic Surgery | Admitting: Physical Therapy

## 2020-01-22 ENCOUNTER — Other Ambulatory Visit: Payer: Self-pay

## 2020-01-22 ENCOUNTER — Encounter: Payer: Self-pay | Admitting: Physical Therapy

## 2020-01-22 DIAGNOSIS — M6281 Muscle weakness (generalized): Secondary | ICD-10-CM | POA: Diagnosis not present

## 2020-01-22 DIAGNOSIS — M25511 Pain in right shoulder: Secondary | ICD-10-CM | POA: Diagnosis not present

## 2020-01-22 DIAGNOSIS — M25611 Stiffness of right shoulder, not elsewhere classified: Secondary | ICD-10-CM | POA: Diagnosis not present

## 2020-01-22 NOTE — Therapy (Signed)
[place], [place], [address] Phone: [phone]   Fax:  [phone]  Physical Therapy Treatment  Patient Details  Name: Melody Jenkins MRN: [id number] Date of Birth: 27-May-1966 Referring Provider (PT): Dr Jean Rosenthal   Encounter Date: 01/22/2020   PT End of Session - 01/22/20 0751    Visit Number 16    Number of Visits 18    Date for PT Re-Evaluation 01/27/20    Authorization Type Human MCR    Progress Note Due on Visit 20    PT Start Time 0751    PT Stop Time 0829    PT Time Calculation (min) 38 min    Activity Tolerance Patient tolerated treatment well    Behavior During Therapy [facility] for tasks assessed/performed           Past Medical History:  Diagnosis Date  . Anemia   . Anxiety   . Asthma   . Carpal tunnel syndrome   . Chronic back pain   . Depression   . History of multiple miscarriages   . Hyperlipidemia   . Torn rotator cuff     Past Surgical History:  Procedure Laterality Date  . BREAST SURGERY  2000   reduction  . CARPAL TUNNEL RELEASE     left  . CESAREAN SECTION    . COSMETIC SURGERY  2000   breast reduction  . REDUCTION MAMMAPLASTY Bilateral   . ROTATOR CUFF REPAIR  10/2019    There were no vitals filed for this visit.   Subjective Assessment - 01/22/20 0754    Subjective " My shoulder is doing pretty good, my back is giving more issues today which I need to get a shot for."    Pain Score 2     Pain Location Shoulder    Pain Orientation Right    Pain Descriptors / Indicators Aching    Pain Type Surgical pain    Pain Frequency Intermittent    Aggravating Factors  repetitive movements    Pain Relieving Factors resting, ice              [facility] PT Assessment - 01/22/20 0001      Assessment   Medical Diagnosis Rt RTC repair    Referring Provider (PT) Dr Jean Rosenthal                         [facility] Adult PT  Treatment/Exercise - 01/22/20 0001      Shoulder Exercises: Standing   External Rotation Strengthening;Theraband;20 reps    Theraband Level (Shoulder External Rotation) Level 3 (Green)    Internal Rotation Strengthening;Right;20 reps;Theraband    Theraband Level (Shoulder Internal Rotation) Level 3 (Green)    Flexion Strengthening;Both;10 reps   x 2 sets   Extension Strengthening;Both;20 reps;Theraband    Theraband Level (Shoulder Extension) Level 4 (Blue)    Corporate treasurer;Theraband;20 reps;Both    Theraband Level (Shoulder Row) Level 4 (Blue)    Other Standing Exercises wall wash at shoulder height flexion, abduction 2 x 12   with 1 # cuff weight around wrist   Other Standing Exercises reaching and tapping wall above head to mimick reaching into cabinet 2 x 10 with 1# cuff weight around wrist      Shoulder Exercises: ROM/Strengthening   UBE (Upper Arm Bike) L3 x 8 min    fwd/bwd x 4 min     Shoulder Exercises: Stretch   Other Shoulder  Stretches R upper trap stretch 2 x 30 sec    Other Shoulder Stretches levator scapuale stretch 2 x 30 seconds                  PT Education - 01/22/20 0820    Education Details upgraded resistance band for shoulder IR/ER and rows    Person(s) Educated Patient    Methods Explanation;Verbal cues    Comprehension Verbal cues required;Verbalized understanding            PT Short Term Goals - 12/09/19 0910      PT SHORT TERM GOAL #1   Title I with initial HEP    Status Achieved      PT SHORT TERM GOAL #2   Title review FOTO results and expectaions    Status Achieved      PT SHORT TERM GOAL #3   Title tolerate AAROM Rt shoulder    Status Achieved      PT SHORT TERM GOAL #4   Title PROM Rt shoulder within protocol    Period Weeks    Status Achieved             PT Long Term Goals - 01/07/20 0850      PT LONG TERM GOAL #1   Title I with advanced HEP for shoulder    Period Weeks      PT LONG TERM GOAL #2   Title  improve FOTO =/< 48% limited    Period Weeks    Status Unable to assess      PT LONG TERM GOAL #3   Title demo Rt shoulder AROM WFL to allow her to use the arm for ADLs    Period Weeks    Status Partially Met      PT LONG TERM GOAL #4   Title demo Rt UE strength =/> 4+/5 to use for light house work    Period Weeks    Status On-going      PT LONG TERM GOAL #5   Title report =/> 75% reduction in Rt shoulder pain    Period Weeks    Status Achieved                 Plan - 01/22/20 2119    Clinical Impression Statement continues working on shoulder strengthening with increasing reps and resistance. she reports no pain inthe shoulder but coninutes to require cues that fatigue is normal and will continue to improve with repetition and doesn't mean damage. no report of pain following session.    PT Next Visit Plan pt is 14 weeks post of 01/21/2020 review ROM, HEP update PRN, provided updated resistance, FOTO    PT Home Exercise Plan [id number]: scap retract, pendulums, elbow flexion AROM, upper trap stretch, wrist flexion/extension, shoulder IR/ER and rows with yellow band, towel IR, and wall walks, reaching into cabinet flexion/ abduction, standing bicep/ tricep strengthening.           Patient will benefit from skilled therapeutic intervention in order to improve the following deficits and impairments:  Decreased range of motion, Obesity, Impaired UE functional use, Increased muscle spasms, Pain, Decreased strength, Increased edema  Visit Diagnosis: Stiffness of right shoulder, not elsewhere classified  Acute pain of right shoulder  Muscle weakness (generalized)     Problem List Patient Active Problem List   Diagnosis Date Noted  . Status post right rotator cuff repair 10/22/2019  . Nontraumatic complete tear of right rotator cuff 04/29/2019  . Wrist  arthritis 11/20/2018  . Trigger index finger of right hand 11/20/2018  . Arthritis of carpometacarpal [facility]) joint of left  thumb 11/20/2018  . Vitamin D deficiency 11/19/2018  . Chronic back pain   . Special screening for malignant neoplasms, colon 08/16/2017  . Type 2 diabetes mellitus with hyperlipidemia ([place]) 02/13/2017  . Leg swelling 02/13/2017  . Chronic constipation 11/20/2016  . Tear of MCL (medial collateral ligament) of knee, left, subsequent encounter 04/06/2016  . Anterolisthesis 04/06/2016  . Situational mixed anxiety and depressive disorder 02/26/2016  . Insomnia 02/24/2016  . Back pain with radiation 02/14/2016  . Essential hypertension, benign 12/26/2015  . DDD (degenerative disc disease), lumbar 12/26/2015  . Seasonal allergies 12/26/2015  . Obesity 04/06/2015  . Anemia   . Asthma   . Depression   . Hyperlipidemia    Starr Lake PT, DPT, LAT, ATC  01/22/20  8:33 AM      [facility] [address] [place], [place], [address] Phone: [phone]   Fax:  [phone]  Name: Melody Jenkins MRN: [id number] Date of Birth: Oct 02, 1965

## 2020-01-27 ENCOUNTER — Other Ambulatory Visit: Payer: Self-pay

## 2020-01-27 ENCOUNTER — Encounter: Payer: Self-pay | Admitting: Physical Therapy

## 2020-01-27 ENCOUNTER — Ambulatory Visit: Payer: Medicare HMO | Admitting: Physical Therapy

## 2020-01-27 DIAGNOSIS — M25511 Pain in right shoulder: Secondary | ICD-10-CM | POA: Diagnosis not present

## 2020-01-27 DIAGNOSIS — M6281 Muscle weakness (generalized): Secondary | ICD-10-CM | POA: Diagnosis not present

## 2020-01-27 DIAGNOSIS — M25611 Stiffness of right shoulder, not elsewhere classified: Secondary | ICD-10-CM

## 2020-01-27 NOTE — Therapy (Signed)
[place], [place], [address] Phone: [phone]   Fax:  [phone]  Physical Therapy Treatment / discharge   Patient Details  Name: Melody Jenkins MRN: [id number] Date of Birth: [birth year] Referring Provider (PT): Dr Jean Rosenthal   Encounter Date: 01/27/2020   PT End of Session - 01/27/20 0930    Visit Number 17    Number of Visits 18    Date for PT Re-Evaluation 01/27/20    Authorization Type Human MCR    Authorization Time Period auth sent in    Progress Note Due on Visit 20    PT Start Time 0930    PT Stop Time 1008    PT Time Calculation (min) 38 min    Activity Tolerance Patient tolerated treatment well    Behavior During Therapy [facility] for tasks assessed/performed           Past Medical History:  Diagnosis Date   Anemia    Anxiety    Asthma    Carpal tunnel syndrome    Chronic back pain    Depression    History of multiple miscarriages    Hyperlipidemia    Torn rotator cuff     Past Surgical History:  Procedure Laterality Date   BREAST SURGERY  2000   reduction   CARPAL TUNNEL RELEASE     left   CESAREAN SECTION     COSMETIC SURGERY  2000   breast reduction   REDUCTION MAMMAPLASTY Bilateral    ROTATOR CUFF REPAIR  10/2019    There were no vitals filed for this visit.   Subjective Assessment - 01/27/20 0931    Subjective "No issues today aside from being tired."    Currently in Pain? No/denies    Aggravating Factors  repettive motions    Pain Relieving Factors resting, ice,              [facility] PT Assessment - 01/27/20 0001      Assessment   Medical Diagnosis Rt RTC repair    Referring Provider (PT) Dr Jean Rosenthal      Observation/Other Assessments   Focus on Therapeutic Outcomes (FOTO)  35% limited      AROM   Right Shoulder Extension 50 Degrees    Right Shoulder Flexion 138 Degrees    Right Shoulder ABduction 134 Degrees      Right Shoulder Internal Rotation --   T11   Right Shoulder External Rotation --   T4     Strength   Right Shoulder Flexion 4/5    Right Shoulder Extension 5/5    Right Shoulder ABduction 4/5    Right Shoulder Internal Rotation 4+/5    Right Shoulder External Rotation 4/5                         [facility] Adult PT Treatment/Exercise - 01/27/20 0001      Shoulder Exercises: Seated   Other Seated Exercises money 2 x 10 with green theraband      Shoulder Exercises: Standing   External Rotation Strengthening;20 reps;Theraband    Theraband Level (Shoulder External Rotation) Level 3 (Green)    Teaching laboratory technician;Theraband;20 reps    Theraband Level (Shoulder Internal Rotation) Level 3 (Green)    Flexion Strengthening;15 reps   scaption unweighted x 2 sets   Extension Strengthening;Both;20 reps;Theraband    Theraband Level (Shoulder Extension) Level 4 (Blue)    Row 20 reps;Strengthening;Theraband  Theraband Level (Shoulder Row) Level 4 (Blue)      Shoulder Exercises: ROM/Strengthening   UBE (Upper Arm Bike) L5 x 4 min    fwd/bwd x 2 min     Shoulder Exercises: Stretch   Other Shoulder Stretches R upper trap stretch 1 x 30 sec    Other Shoulder Stretches levator scapuale stretch 1 x 30 seconds                  PT Education - 01/27/20 0948    Education Details reviewed and updated HEP. discussed appropriate progress of shoulder strengtheing to promote endurnace, and importnace of consistency to maintain current LOF. Reviewed Final FOTO assessment.    Person(s) Educated Patient    Methods Explanation;Verbal cues;Handout    Comprehension Verbalized understanding;Verbal cues required            PT Short Term Goals - 12/09/19 0910      PT SHORT TERM GOAL #1   Title I with initial HEP    Status Achieved      PT SHORT TERM GOAL #2   Title review FOTO results and expectaions    Status Achieved      PT SHORT TERM GOAL #3   Title tolerate  AAROM Rt shoulder    Status Achieved      PT SHORT TERM GOAL #4   Title PROM Rt shoulder within protocol    Period Weeks    Status Achieved             PT Long Term Goals - 01/27/20 0933      PT LONG TERM GOAL #1   Title I with advanced HEP for shoulder    Period Weeks    Status Achieved      PT LONG TERM GOAL #2   Title improve FOTO =/< 48% limited    Period Weeks    Status Achieved      PT LONG TERM GOAL #3   Title demo Rt shoulder AROM WFL to allow her to use the arm for ADLs    Period Weeks    Status Achieved      PT LONG TERM GOAL #4   Title demo Rt UE strength =/> 4+/5 to use for light house work    Baseline flexion/ abduction and ER were 4/5    Period Weeks    Status Partially Met      PT LONG TERM GOAL #5   Title report =/> 75% reduction in Rt shoulder pain    Period Weeks    Status Achieved                 Plan - 01/27/20 0954    Clinical Impression Statement [name] has made great progress with physical therapy increasing shoulder ROM and strength and additionally reports no pain. She met or partially met all goals today. She was able to perform all exericses with no report of pain. She is able to maintain and progress her current LOF IND and will be dishcarged from PT today.    PT Treatment/Interventions Iontophoresis 78m/ml Dexamethasone;Taping;Vasopneumatic Device;Patient/family education;Moist Heat;Ultrasound;Therapeutic activities;Passive range of motion;Therapeutic exercise;Cryotherapy;Electrical Stimulation;Manual techniques;Dry needling    PT Next Visit Plan D/C today    PT Home Exercise Plan [id number]: scap retract, pendulums, elbow flexion AROM, upper trap stretch, wrist flexion/extension, shoulder IR/ER and rows with yellow band, towel IR, and wall walks, reaching into cabinet flexion/ abduction, standing bicep/ tricep strengthening. money and scaption  Patient will benefit from skilled therapeutic intervention in order to  improve the following deficits and impairments:  Decreased range of motion, Obesity, Impaired UE functional use, Increased muscle spasms, Pain, Decreased strength, Increased edema  Visit Diagnosis: Stiffness of right shoulder, not elsewhere classified  Acute pain of right shoulder  Muscle weakness (generalized)     Problem List Patient Active Problem List   Diagnosis Date Noted   Status post right rotator cuff repair 10/22/2019   Nontraumatic complete tear of right rotator cuff 04/29/2019   Wrist arthritis 11/20/2018   Trigger index finger of right hand 11/20/2018   Arthritis of carpometacarpal ([place]) joint of left thumb 11/20/2018   Vitamin D deficiency 11/19/2018   Chronic back pain    Special screening for malignant neoplasms, colon 08/16/2017   Type 2 diabetes mellitus with hyperlipidemia ([place]) 02/13/2017   Leg swelling 02/13/2017   Chronic constipation 11/20/2016   Tear of MCL (medial collateral ligament) of knee, left, subsequent encounter 04/06/2016   Anterolisthesis 04/06/2016   Situational mixed anxiety and depressive disorder 02/26/2016   Insomnia 02/24/2016   Back pain with radiation 02/14/2016   Essential hypertension, benign 12/26/2015   DDD (degenerative disc disease), lumbar 12/26/2015   Seasonal allergies 12/26/2015   Obesity 04/06/2015   Anemia    Asthma    Depression    Hyperlipidemia     Melody Jenkins 01/27/2020, 10:10 AM  [place], [place], [address] Phone: [phone]   Fax:  [phone]  Name: Melody Jenkins MRN: [id number] Date of Birth: December 26, 1965     PHYSICAL THERAPY DISCHARGE SUMMARY  Visits from Start of Care: 17  Current functional level related to goals / functional outcomes: See goals, FOTO 35% limited   Remaining deficits: See assessment    Education / Equipment: HEP, theraband, posture/ lifting mechanics. Pain  education  Plan: Patient agrees to discharge.  Patient goals were partially met. Patient is being discharged due to meeting the stated rehab goals.  ?????           Madyson Lukach PT, DPT, LAT, ATC  01/27/20  10:10 AM

## 2020-01-27 NOTE — Progress Notes (Signed)
54 y.o. [id number] Single Black or African American Not Hispanic or Latino female here for annual exam.  No vaginal bleeding. Not sexually active.     She has chronic lower back pain after a fall in 2017. She is followed in pain management. The steroid shots worsened her diabetes. Now under better control. Last HgbA1C was 5.8 (earlier this month). Her FS have been too low and her primary is adjusting her medication.   She has a Teacher, music and a Transport planner. Depression is up and down. The medicine is helping. PTSD is better.   She has had her right rotator cuff repaired, doing better.   She has been vaccinated for Covid.  Mom was diagnosed with breast cancer in the last 6 months. Mom has heart failure as well, had a defibrillator placed.   Patient's last menstrual period was 12/17/2017.          Sexually active: No.  The current method of family planning is post menopausal status.    Exercising: No.  The patient does not participate in regular exercise at present. Smoker:  no  Health Maintenance: Pap:12/25/18 WNL HPV Neg,  08/29/2017 atypical glanular cells, 04-06-15 WNL History of abnormal Pap:  yesECC 09/17/2017 WNL MMG:  05/12/19 Rt breast u/s Density B Bi-rads 2 benign  BMD:   Never  Colonoscopy: 09/05/17 polyps (precancerous) TDaP:  02/20/19 Gardasil: NA   reports that she quit smoking about 22 months ago. Her smoking use included cigarettes. She has a 7.50 pack-year smoking history. She has never used smokeless tobacco. She reports that she does not drink alcohol and does not use drugs. She is on disability for chronic back pain. 21 year old Daughter just moved out. She has an aid that comes in and helps her.   Past Medical History:  Diagnosis Date  . Anemia   . Anxiety   . Asthma   . Carpal tunnel syndrome   . Chronic back pain   . Depression   . History of multiple miscarriages   . Hyperlipidemia   . Torn rotator cuff     Past Surgical History:  Procedure Laterality Date   . BREAST SURGERY  2000   reduction  . CARPAL TUNNEL RELEASE     left  . CESAREAN SECTION    . COSMETIC SURGERY  2000   breast reduction  . REDUCTION MAMMAPLASTY Bilateral   . ROTATOR CUFF REPAIR  10/2019    Current Outpatient Medications  Medication Sig Dispense Refill  . albuterol (VENTOLIN HFA) 108 (90 Base) MCG/ACT inhaler INHALE 2 PUFFS INTO THE LUNGS EVERY 4 HOURS AS NEEDED FOR WHEEZING OR SHORTNESS OF BREATH 18 g 5  . Biotin 5000 MCG CAPS Take 1 capsule by mouth.    . Blood Glucose Monitoring Suppl (BLOOD GLUCOSE SYSTEM PAK) KIT Please dispense based on patient and insurance preference. Use as directed to monitor FSBS 1x daily. Dx: R73.09. 1 each 0  . Cholecalciferol (VITAMIN D3) 25 MCG (1000 UT) CAPS Take by mouth 2 (two) times daily.    . cloNIDine (CATAPRES) 0.2 MG tablet Take 1 tablet (0.2 mg total) by mouth 2 (two) times daily. 180 tablet 3  . Cyanocobalamin (VITAMIN B 12 PO) Take 1 tablet by mouth daily.     . cyclobenzaprine (FLEXERIL) 10 MG tablet Take 1 tablet (10 mg total) by mouth 3 (three) times daily as needed for muscle spasms. 30 tablet 0  . Dulaglutide (TRULICITY) 8.09 XI/3.3AS SOPN Inject 0.75 mg into the skin once a  week. 4 pen 11  . ELDERBERRY PO Take by mouth.    . furosemide (LASIX) 20 MG tablet TAKE 1 TABLET BY MOUTH EVERY DAY 30 tablet 6  . gabapentin (NEURONTIN) 300 MG capsule Take 600 mg by mouth 3 (three) times daily.    . Glucose Blood (BLOOD GLUCOSE TEST STRIPS) STRP Please dispense based on patient and insurance preference. Use as directed to monitor FSBS 1x daily. Dx: R73.09. 100 each 1  . hydrOXYzine (ATARAX/VISTARIL) 50 MG tablet Take 1 tablet (50 mg total) by mouth 3 (three) times daily as needed for anxiety. 90 tablet 2  . Lancets MISC Please dispense based on patient and insurance preference. Use as directed to monitor FSBS 1x daily. Dx: R73.09. 100 each 1  . lithium carbonate (LITHOBID) 300 MG CR tablet Take 2 tablets (600 mg total) by mouth 2  (two) times daily. 120 tablet 2  . meloxicam (MOBIC) 7.5 MG tablet Take 7.5 mg by mouth 2 (two) times daily.  5  . metoprolol succinate (TOPROL-XL) 100 MG 24 hr tablet Take 1 tablet (100 mg total) by mouth daily. Take with or immediately following a meal. 90 tablet 1  . mirtazapine (REMERON) 45 MG tablet Take 1 tablet (45 mg total) by mouth at bedtime. 30 tablet 2  . MOTEGRITY 2 MG TABS TAKE 1 TABLET BY MOUTH DAILY. 30 tablet 5  . Multiple Vitamins-Minerals (AIRBORNE PO) Take by mouth daily.    Marland Kitchen oxyCODONE-acetaminophen (PERCOCET/ROXICET) 5-325 MG tablet Take by mouth 3 (three) times daily.    . rosuvastatin (CRESTOR) 10 MG tablet Take 1 tablet (10 mg total) by mouth daily. 90 tablet 1  . Turmeric (QC TUMERIC COMPLEX PO) Take by mouth.    . vitamin C (ASCORBIC ACID) 500 MG tablet Take 500 mg by mouth daily.    . vitamin E (VITAMIN E) 400 UNIT capsule Take 400 Units by mouth daily.     No current facility-administered medications for this visit.    Family History  Problem Relation Age of Onset  . Diabetes Mother   . Hypertension Mother   . Hypertension Father   . Heart disease Father   . Early death Brother   . Kidney disease Maternal Grandmother   . Heart disease Brother   . Colon cancer Neg Hx   . Rectal cancer Neg Hx     Review of Systems  Psychiatric/Behavioral: Positive for dysphoric mood. The patient is nervous/anxious.   All other systems reviewed and are negative.   Exam:   BP 140/82   Pulse 90   Ht 5' 7.5" (1.715 m)   Wt 224 lb (101.6 kg)   LMP 12/17/2017   SpO2 99%   BMI 34.57 kg/m   Weight change: _0 @ Height:   Height: 5' 7.5" (171.5 cm)  Ht Readings from Last 3 Encounters:  01/28/20 5' 7.5" (1.715 m)  01/13/20 _1  (1.753 m)  08/31/19 _2  (1.753 m)    General appearance: alert, cooperative and appears stated age Head: Normocephalic, without obvious abnormality, atraumatic Neck: no adenopathy, supple, symmetrical, trachea midline and thyroid  normal to inspection and palpation Lungs: clear to auscultation bilaterally Cardiovascular: regular rate and rhythm Breasts: normal appearance, no masses or tenderness Abdomen: soft, non-tender; non distended,  no masses,  no organomegaly Extremities: extremities normal, atraumatic, no cyanosis or edema Skin: Skin color, texture, turgor normal. No rashes or lesions Lymph nodes: Cervical, supraclavicular, and axillary nodes normal. No abnormal inguinal nodes palpated Neurologic: Grossly normal  Pelvic: External genitalia:  no lesions              Urethra:  normal appearing urethra with no masses, tenderness or lesions              Bartholins and Skenes: normal                 Vagina: atrophic appearing vagina with normal color and discharge, no lesions              Cervix: no lesions               Bimanual Exam:  Uterus:  no masses or tenderness              Adnexa: no mass, fullness, tenderness               Rectovaginal: Confirms               Anus:  normal sphincter tone, no lesions  Gae Dry chaperoned for the exam.  A:  Well Woman with normal exam  H/O abnormal pap  P:   Pap with hpv  Mammogram in 12/21  Colonoscopy due 3/22  Discussed breast self exam  Discussed calcium and vit D intake

## 2020-01-28 ENCOUNTER — Ambulatory Visit (INDEPENDENT_AMBULATORY_CARE_PROVIDER_SITE_OTHER): Payer: Medicare HMO | Admitting: Obstetrics and Gynecology

## 2020-01-28 ENCOUNTER — Other Ambulatory Visit (HOSPITAL_COMMUNITY)
Admission: RE | Admit: 2020-01-28 | Discharge: 2020-01-28 | Disposition: A | Discharge status: Home / Self Care | Payer: Medicare HMO | Source: Ambulatory Visit | Attending: Obstetrics and Gynecology | Admitting: Obstetrics and Gynecology

## 2020-01-28 ENCOUNTER — Encounter: Payer: Self-pay | Admitting: Obstetrics and Gynecology

## 2020-01-28 VITALS — BP 140/82 | HR 90 | Ht 67.5 in | Wt 224.0 lb

## 2020-01-28 DIAGNOSIS — Z1151 Encounter for screening for human papillomavirus (HPV): Secondary | ICD-10-CM | POA: Diagnosis not present

## 2020-01-28 DIAGNOSIS — Z01419 Encounter for gynecological examination (general) (routine) without abnormal findings: Secondary | ICD-10-CM | POA: Insufficient documentation

## 2020-01-28 DIAGNOSIS — Z124 Encounter for screening for malignant neoplasm of cervix: Secondary | ICD-10-CM | POA: Diagnosis not present

## 2020-01-28 DIAGNOSIS — Z8742 Personal history of other diseases of the female genital tract: Secondary | ICD-10-CM | POA: Insufficient documentation

## 2020-01-28 DIAGNOSIS — Z9289 Personal history of other medical treatment: Secondary | ICD-10-CM

## 2020-01-28 DIAGNOSIS — Z8639 Personal history of other endocrine, nutritional and metabolic disease: Secondary | ICD-10-CM | POA: Insufficient documentation

## 2020-01-28 DIAGNOSIS — I10 Essential (primary) hypertension: Secondary | ICD-10-CM | POA: Insufficient documentation

## 2020-01-28 NOTE — Patient Instructions (Signed)
EXERCISE AND DIET:  We recommended that you start or continue a regular exercise program for good health. Regular exercise means any activity that makes your heart beat faster and makes you sweat.  We recommend exercising at least 30 minutes per day at least 3 days a week, preferably 4 or 5.  We also recommend a diet low in fat and sugar.  Inactivity, poor dietary choices and obesity can cause diabetes, heart attack, stroke, and kidney damage, among others.    ALCOHOL AND SMOKING:  Women should limit their alcohol intake to no more than 7 drinks/beers/glasses of wine (combined, not each!) per week. Moderation of alcohol intake to this level decreases your risk of breast cancer and liver damage. And of course, no recreational drugs are part of a healthy lifestyle.  And absolutely no smoking or even second hand smoke. Most people know smoking can cause heart and lung diseases, but did you know it also contributes to weakening of your bones? Aging of your skin?  Yellowing of your teeth and nails?  CALCIUM AND VITAMIN D:  Adequate intake of calcium and Vitamin D are recommended.  The recommendations for exact amounts of these supplements seem to change often, but generally speaking 1,200 mg of calcium (between diet and supplement) and 800 units of Vitamin D per day seems prudent. Certain women may benefit from higher intake of Vitamin D.  If you are among these women, your doctor will have told you during your visit.    PAP SMEARS:  Pap smears, to check for cervical cancer or precancers,  have traditionally been done yearly, although recent scientific advances have shown that most women can have pap smears less often.  However, every woman still should have a physical exam from her gynecologist every year. It will include a breast check, inspection of the vulva and vagina to check for abnormal growths or skin changes, a visual exam of the cervix, and then an exam to evaluate the size and shape of the uterus and  ovaries.  And after 55 years of age, a rectal exam is indicated to check for rectal cancers. We will also provide age appropriate advice regarding health maintenance, like when you should have certain vaccines, screening for sexually transmitted diseases, bone density testing, colonoscopy, mammograms, etc.   MAMMOGRAMS:  All women over 40 years old should have a yearly mammogram. Many facilities now offer a "3D" mammogram, which may cost around $50 extra out of pocket. If possible,  we recommend you accept the option to have the 3D mammogram performed.  It both reduces the number of women who will be called back for extra views which then turn out to be normal, and it is better than the routine mammogram at detecting truly abnormal areas.    COLON CANCER SCREENING: Now recommend starting at age 45. At this time colonoscopy is not covered for routine screening until 50. There are take home tests that can be done between 45-49.   COLONOSCOPY:  Colonoscopy to screen for colon cancer is recommended for all women at age 50.  We know, you hate the idea of the prep.  We agree, BUT, having colon cancer and not knowing it is worse!!  Colon cancer so often starts as a polyp that can be seen and removed at colonscopy, which can quite literally save your life!  And if your first colonoscopy is normal and you have no family history of colon cancer, most women don't have to have it again for   10 years.  Once every ten years, you can do something that may end up saving your life, right?  We will be happy to help you get it scheduled when you are ready.  Be sure to check your insurance coverage so you understand how much it will cost.  It may be covered as a preventative service at no cost, but you should check your particular policy.      Breast Self-Awareness Breast self-awareness means being familiar with how your breasts look and feel. It involves checking your breasts regularly and reporting any changes to your  health care provider. Practicing breast self-awareness is important. A change in your breasts can be a sign of a serious medical problem. Being familiar with how your breasts look and feel allows you to find any problems early, when treatment is more likely to be successful. All women should practice breast self-awareness, including women who have had breast implants. How to do a breast self-exam One way to learn what is normal for your breasts and whether your breasts are changing is to do a breast self-exam. To do a breast self-exam: Look for Changes  1. Remove all the clothing above your waist. 2. Stand in front of a mirror in a room with good lighting. 3. Put your hands on your hips. 4. Push your hands firmly downward. 5. Compare your breasts in the mirror. Look for differences between them (asymmetry), such as: ? Differences in shape. ? Differences in size. ? Puckers, dips, and bumps in one breast and not the other. 6. Look at each breast for changes in your skin, such as: ? Redness. ? Scaly areas. 7. Look for changes in your nipples, such as: ? Discharge. ? Bleeding. ? Dimpling. ? Redness. ? A change in position. Feel for Changes Carefully feel your breasts for lumps and changes. It is best to do this while lying on your back on the floor and again while sitting or standing in the shower or tub with soapy water on your skin. Feel each breast in the following way:  Place the arm on the side of the breast you are examining above your head.  Feel your breast with the other hand.  Start in the nipple area and make  inch (2 cm) overlapping circles to feel your breast. Use the pads of your three middle fingers to do this. Apply light pressure, then medium pressure, then firm pressure. The light pressure will allow you to feel the tissue closest to the skin. The medium pressure will allow you to feel the tissue that is a little deeper. The firm pressure will allow you to feel the tissue  close to the ribs.  Continue the overlapping circles, moving downward over the breast until you feel your ribs below your breast.  Move one finger-width toward the center of the body. Continue to use the  inch (2 cm) overlapping circles to feel your breast as you move slowly up toward your collarbone.  Continue the up and down exam using all three pressures until you reach your armpit.  Write Down What You Find  Write down what is normal for each breast and any changes that you find. Keep a written record with breast changes or normal findings for each breast. By writing this information down, you do not need to depend only on memory for size, tenderness, or location. Write down where you are in your menstrual cycle, if you are still menstruating. If you are having trouble noticing differences   in your breasts, do not get discouraged. With time you will become more familiar with the variations in your breasts and more comfortable with the exam. How often should I examine my breasts? Examine your breasts every month. If you are breastfeeding, the best time to examine your breasts is after a feeding or after using a breast pump. If you menstruate, the best time to examine your breasts is 5-7 days after your period is over. During your period, your breasts are lumpier, and it may be more difficult to notice changes. When should I see my health care provider? See your health care provider if you notice:  A change in shape or size of your breasts or nipples.  A change in the skin of your breast or nipples, such as a reddened or scaly area.  Unusual discharge from your nipples.  A lump or thick area that was not there before.  Pain in your breasts.  Anything that concerns you.  

## 2020-01-29 ENCOUNTER — Other Ambulatory Visit: Payer: Self-pay | Admitting: Family Medicine

## 2020-02-01 LAB — CYTOLOGY - PAP
Comment: NEGATIVE
Diagnosis: NEGATIVE
High risk HPV: NEGATIVE

## 2020-02-16 DIAGNOSIS — K006 Disturbances in tooth eruption: Secondary | ICD-10-CM | POA: Diagnosis not present

## 2020-02-16 DIAGNOSIS — R69 Illness, unspecified: Secondary | ICD-10-CM | POA: Diagnosis not present

## 2020-02-18 ENCOUNTER — Other Ambulatory Visit: Payer: Self-pay | Admitting: *Deleted

## 2020-02-18 MED ORDER — FUROSEMIDE 20 MG PO TABS: 20.0000 mg | ORAL_TABLET | Freq: Every day | ORAL | 6 refills | Status: DC

## 2020-02-18 MED ORDER — FUROSEMIDE 20 MG PO TABS: 20.0000 mg | ORAL_TABLET | Freq: Every day | ORAL | 1 refills | Status: DC

## 2020-02-19 ENCOUNTER — Encounter: Payer: Self-pay | Admitting: Family Medicine

## 2020-02-19 ENCOUNTER — Encounter: Payer: Self-pay | Admitting: Orthopaedic Surgery

## 2020-04-18 DIAGNOSIS — M5416 Radiculopathy, lumbar region: Secondary | ICD-10-CM | POA: Diagnosis not present

## 2020-04-18 DIAGNOSIS — M5137 Other intervertebral disc degeneration, lumbosacral region: Secondary | ICD-10-CM | POA: Diagnosis not present

## 2020-04-18 DIAGNOSIS — F419 Anxiety disorder, unspecified: Secondary | ICD-10-CM | POA: Diagnosis not present

## 2020-04-18 DIAGNOSIS — F329 Major depressive disorder, single episode, unspecified: Secondary | ICD-10-CM | POA: Diagnosis not present

## 2020-04-21 ENCOUNTER — Telehealth (HOSPITAL_COMMUNITY): Payer: Medicare HMO | Admitting: Psychiatry

## 2020-04-21 ENCOUNTER — Other Ambulatory Visit: Payer: Self-pay

## 2020-04-21 DIAGNOSIS — F411 Generalized anxiety disorder: Secondary | ICD-10-CM

## 2020-04-21 DIAGNOSIS — F333 Major depressive disorder, recurrent, severe with psychotic symptoms: Secondary | ICD-10-CM

## 2020-04-21 DIAGNOSIS — G4701 Insomnia due to medical condition: Secondary | ICD-10-CM

## 2020-04-21 DIAGNOSIS — F4310 Post-traumatic stress disorder, unspecified: Secondary | ICD-10-CM

## 2020-04-21 MED ORDER — MIRTAZAPINE 45 MG PO TABS: 45.0000 mg | ORAL_TABLET | Freq: Every day | ORAL | 2 refills | Status: DC

## 2020-04-21 MED ORDER — HYDROXYZINE HCL 50 MG PO TABS: 50.0000 mg | ORAL_TABLET | Freq: Three times a day (TID) | ORAL | 2 refills | Status: DC | PRN

## 2020-04-21 MED ORDER — LITHIUM CARBONATE ER 300 MG PO TBCR: 600.0000 mg | EXTENDED_RELEASE_TABLET | Freq: Two times a day (BID) | ORAL | 2 refills | Status: DC

## 2020-04-21 NOTE — Progress Notes (Unsigned)
Virtual Visit via Telephone Note  I connected with Melody Jenkins on 04/21/20 at 10:30 AM EST by telephone and verified that I am speaking with the correct person using two identifiers.  Location: Patient: home Provider: office   I discussed the limitations, risks, security and privacy concerns of performing an evaluation and management service by telephone and the availability of in person appointments. I also discussed with the patient that there may be a patient responsible charge related to this service. The patient expressed understanding and agreed to proceed.   History of Present Illness: Melody Jenkins is a little worried about getting thru the holidays but thinks she will make it thru. Last month her lawyer settled her case without her approval. It was very hard and she doesn't know how to deal with it. It was a big blow. Melody Jenkins had a break down last month as a result. Her back pain is overwhelming. Her mom had surgery last month as well. It has been a lot. She has been fighting for so long. She was having a lot of negative thoughts and some hopelessness. She felt like giving up. Melody Jenkins doesn't want to deal with any more stress. She knows that life is full of stress. Melody Jenkins is depressed and has a lot of feelings of worthlessness. She has some support from her mom but not as much as she needs. Melody Jenkins doesn't feel she has anyone to talk to without feeling guilty. The ongoing health issues and COVID precautions weigh heavily on her. Her sleep is poor. She is not able to fall asleep until midnight with Remeron but wakes up around 2-3am. She may drift off again after a few hours for another 2-3 hrs.  Melody Jenkins often finds herself falling asleep on the couch instead of her bed. She thinks it is the depression and anxiety. She feels she is stuck in a tunnel that she can't get out of.  Her sleep is not restful. Melody Jenkins still feels hopeless. Melody Jenkins admits to having passive thoughts of death- "wanting  to give up" but denies SI without plan or intent. She denies HI. Several months ago she was impulsively buying lottery tickets and spending money on things she didn't need. It has not happened since then.    Observations/Objective:  General Appearance: unable to assess  Eye Contact:  unable to assess  Speech:  {Speech:22685}  Volume:  {Volume (PAA):22686}  Mood:  Anxious and Depressed  Affect:  Congruent  Thought Process:  Coherent and Descriptions of Associations: Circumstantial  Orientation:  Full (Time, Place, and Person)  Thought Content:  Rumination  Suicidal Thoughts:  No  Homicidal Thoughts:  No  Memory:  {BHH MEMORY:22881}  Judgement:  {Judgement (PAA):22694}  Insight:  {Insight (PAA):22695}  Psychomotor Activity: unable to assess  Concentration:  {Concentration:21399}  Recall:  {BHH GOOD/FAIR/POOR:22877}  Fund of Knowledge:  {BHH GOOD/FAIR/POOR:22877}  Language:  {BHH GOOD/FAIR/POOR:22877}  Akathisia:  unable to assess  Handed:  {Handed:22697}  AIMS (if indicated):     Assets:  {Assets (PAA):22698}  ADL's:  unable to assess  Cognition:  {chl bhh cognition:304700322}  Sleep:         Assessment and Plan:  MDD- recurrent, severe without psychotic features; GAD; PTSD; Insomnia  Lithium 600mg  po BID  Vistaril 50mg  po TID prn anxiety  Remeron 45mg  po qHS  Clonidine 0.2mg  po BID prn from her PCP  Pt is on a number of sedating and BP meds  Melody Jenkins encouraged to restart therapy  Melody Jenkins is eating healthier  and is losing weight. She is working with her PCP to manage her sugars.    Follow Up Instructions: In 4-6 weeks or sooner if needed   I discussed the assessment and treatment plan with the patient. The patient was provided an opportunity to ask questions and all were answered. The patient agreed with the plan and demonstrated an understanding of the instructions.   The patient was advised to call back or seek an in-person evaluation if the symptoms worsen or  if the condition fails to improve as anticipated.  I provided 30 minutes of non-face-to-face time during this encounter.   Charlcie Cradle, MD

## 2020-05-11 DIAGNOSIS — Z6834 Body mass index (BMI) 34.0-34.9, adult: Secondary | ICD-10-CM | POA: Diagnosis not present

## 2020-05-11 DIAGNOSIS — I10 Essential (primary) hypertension: Secondary | ICD-10-CM | POA: Diagnosis not present

## 2020-05-11 DIAGNOSIS — M47816 Spondylosis without myelopathy or radiculopathy, lumbar region: Secondary | ICD-10-CM | POA: Diagnosis not present

## 2020-05-16 ENCOUNTER — Encounter: Payer: Self-pay | Admitting: Family Medicine

## 2020-05-16 ENCOUNTER — Ambulatory Visit (INDEPENDENT_AMBULATORY_CARE_PROVIDER_SITE_OTHER): Payer: Medicare HMO | Admitting: Family Medicine

## 2020-05-16 ENCOUNTER — Other Ambulatory Visit: Payer: Self-pay

## 2020-05-16 ENCOUNTER — Telehealth: Payer: Self-pay | Admitting: *Deleted

## 2020-05-16 VITALS — BP 138/82 | HR 84 | Temp 98.3°F | Resp 14 | Ht 67.5 in | Wt 232.0 lb

## 2020-05-16 DIAGNOSIS — Z23 Encounter for immunization: Secondary | ICD-10-CM

## 2020-05-16 DIAGNOSIS — E785 Hyperlipidemia, unspecified: Secondary | ICD-10-CM | POA: Diagnosis not present

## 2020-05-16 DIAGNOSIS — G8929 Other chronic pain: Secondary | ICD-10-CM

## 2020-05-16 DIAGNOSIS — L309 Dermatitis, unspecified: Secondary | ICD-10-CM | POA: Diagnosis not present

## 2020-05-16 DIAGNOSIS — I10 Essential (primary) hypertension: Secondary | ICD-10-CM

## 2020-05-16 DIAGNOSIS — M5441 Lumbago with sciatica, right side: Secondary | ICD-10-CM

## 2020-05-16 DIAGNOSIS — Z1231 Encounter for screening mammogram for malignant neoplasm of breast: Secondary | ICD-10-CM | POA: Diagnosis not present

## 2020-05-16 DIAGNOSIS — Z6836 Body mass index (BMI) 36.0-36.9, adult: Secondary | ICD-10-CM

## 2020-05-16 DIAGNOSIS — E559 Vitamin D deficiency, unspecified: Secondary | ICD-10-CM | POA: Diagnosis not present

## 2020-05-16 DIAGNOSIS — M5442 Lumbago with sciatica, left side: Secondary | ICD-10-CM

## 2020-05-16 DIAGNOSIS — E782 Mixed hyperlipidemia: Secondary | ICD-10-CM

## 2020-05-16 DIAGNOSIS — E1169 Type 2 diabetes mellitus with other specified complication: Secondary | ICD-10-CM

## 2020-05-16 MED ORDER — BLOOD GLUCOSE SYSTEM PAK KIT
PACK | 1 refills | Status: DC
Start: 2020-05-16 — End: 2020-05-19

## 2020-05-16 MED ORDER — BLOOD GLUCOSE TEST VI STRP
ORAL_STRIP | 1 refills | Status: DC
Start: 2020-05-16 — End: 2020-05-19

## 2020-05-16 MED ORDER — LANCETS MISC
1 refills | Status: DC
Start: 2020-05-16 — End: 2020-05-19

## 2020-05-16 MED ORDER — HYDROCORTISONE 2.5 % EX OINT: TOPICAL_OINTMENT | Freq: Two times a day (BID) | CUTANEOUS | 2 refills | Status: DC

## 2020-05-16 MED ORDER — TRULICITY 0.75 MG/0.5ML ~~LOC~~ SOAJ
0.7500 mg | SUBCUTANEOUS | 2 refills | Status: DC
Start: 2020-05-16 — End: 2020-06-09

## 2020-05-16 NOTE — Patient Instructions (Addendum)
New meter to be sent  We will call with lab results Schedule your mammogram Schedule your eye exam Flu shot done

## 2020-05-16 NOTE — Telephone Encounter (Signed)
-----   Message from Alycia Rossetti, MD sent at 05/16/2020  8:49 AM EST ----- Regarding: nEEDS NEW METER, TEST STRIPS/LANCETS to [facility]   Check CBG twice a day

## 2020-05-16 NOTE — Progress Notes (Signed)
Subjective:    Patient ID: Melody Jenkins, female    DOB: July 22, 1965, 54 y.o.   MRN: [id number]  Patient presents for Follow-up (is fasting) and Rash (chin- would like prescription for hyrdrocortisone)  DM- due for U9N, last 2.3%  Trulicity 5.57 mg weekly  But she missed last week- states she has been stressed so she hasnt picked it up , this AM 105, CBG average 108 but only 21 readings over the past 90 days   No hypoglycemia symptoms  Due for eye exam    Hyperlipidemia taking Crestor 10mg ,   HTN- taking BP meds as prescribed, no chest pain, no SOB, Toprol, clonidine and lasix   Follows with behavioral health- no recent changes, still on lithium , she has some stressors with her lawyer and family, she is trying to find a new therapist  Chronic pain- per ortho and pain management Dr. Brien Few  taking oxycodone- acetomiophen, gabapentin, flexeril and mobic BID She did have recent injection in her back  They are considering a permanent nerve block  Obesity - weight in August 224lbs, today up 8 lbs  Has not taken any meds  Today   Chronic facial dermaitis, has seen dermatology, states only thing that helped was hydrocortisone 2.5%, would like this sent in, she was using her mothers cream   Due for mammogram    Review Of Systems:  GEN- denies fatigue, fever, weight loss,weakness, recent illness HEENT- denies eye drainage, change in vision, nasal discharge, CVS- denies chest pain, palpitations RESP- denies SOB, cough, wheeze ABD- denies N/V, change in stools, abd pain GU- denies dysuria, hematuria, dribbling, incontinence MSK- + joint pain, muscle aches, injury Neuro- denies headache, dizziness, syncope, seizure activity       Objective:    BP 138/82   Pulse 84   Temp 98.3 F (36.8 C) (Temporal)   Resp 14   Ht 5' 7.5" (1.715 m)   Wt 232 lb (105.2 kg)   LMP 12/17/2017   SpO2 99%   BMI 35.80 kg/m  GEN- NAD, alert and oriented x3 HEENT- PERRL, EOMI, non  injected sclera, pink conjunctiva, MMM, oropharynx clear Neck- Supple, no thyromegaly CVS- RRR, no murmur RESP-CTAB ABD-NABS,soft,NT,ND Skin - chin hyperpigmented macular lesions and scarring on chin  EXT- No edema Pulses- Radial, DP- 2+        Assessment & Plan:      Problem List Items Addressed This Visit      Unprioritized   Chronic back pain    Continue pain meds per pain management       Essential hypertension, benign - Primary    Controlled no changes, no meds taken this AM      Relevant Orders   CBC with Differential/Platelet (Completed)   Comprehensive metabolic panel (Completed)   Hyperlipidemia   Obesity   Relevant Medications   Dulaglutide (TRULICITY) 3.22 GU/5.4YH SOPN   Type 2 diabetes mellitus with hyperlipidemia (HCC)    Continue trulicity Check C6C goal < 7% Discussed importance of checking CBG      Relevant Medications   Dulaglutide (TRULICITY) 3.76 EG/3.1DV SOPN   Other Relevant Orders   Hemoglobin A1c (Completed)   Vitamin D deficiency    Other Visit Diagnoses    Need for immunization against influenza       Relevant Orders   Flu Vaccine QUAD 36+ mos IM (Completed)   Encounter for screening mammogram for malignant neoplasm of breast       Relevant Orders  MM 3D SCREEN BREAST BILATERAL   Facial dermatitis       spot treat with hydrocortisone 2.5%, she declines returning to dermatology       Note: This dictation was prepared with Dragon dictation along with smaller phrase technology. Any transcriptional errors that result from this process are unintentional.

## 2020-05-16 NOTE — Telephone Encounter (Signed)
Prescription sent to pharmacy.

## 2020-05-17 LAB — COMPREHENSIVE METABOLIC PANEL
AG Ratio: 1.4 (calc) (ref 1.0–2.5)
ALT: 14 U/L (ref 6–29)
AST: 11 U/L (ref 10–35)
Albumin: 3.8 g/dL (ref 3.6–5.1)
Alkaline phosphatase (APISO): 74 U/L (ref 37–153)
BUN: 18 mg/dL (ref 7–25)
CO2: 30 mmol/L (ref 20–32)
Calcium: 9 mg/dL (ref 8.6–10.4)
Chloride: 108 mmol/L (ref 98–110)
Creat: 0.77 mg/dL (ref 0.50–1.05)
Globulin: 2.7 g/dL (calc) (ref 1.9–3.7)
Glucose, Bld: 111 mg/dL — ABNORMAL HIGH (ref 65–99)
Potassium: 4 mmol/L (ref 3.5–5.3)
Sodium: 143 mmol/L (ref 135–146)
Total Bilirubin: 0.3 mg/dL (ref 0.2–1.2)
Total Protein: 6.5 g/dL (ref 6.1–8.1)

## 2020-05-17 LAB — HEMOGLOBIN A1C
Hgb A1c MFr Bld: 5.9 % of total Hgb — ABNORMAL HIGH (ref <5.7)
Mean Plasma Glucose: 123 mg/dL
eAG (mmol/L): 6.8 mmol/L

## 2020-05-17 LAB — CBC WITH DIFFERENTIAL/PLATELET
Absolute Monocytes: 566 cells/uL (ref 200–950)
Basophils Absolute: 48 cells/uL (ref 0–200)
Basophils Relative: 0.7 %
Eosinophils Absolute: 179 cells/uL (ref 15–500)
Eosinophils Relative: 2.6 %
HCT: 42.8 % (ref 35.0–45.0)
Hemoglobin: 13.7 g/dL (ref 11.7–15.5)
Lymphs Abs: 3098 cells/uL (ref 850–3900)
MCH: 26.9 pg — ABNORMAL LOW (ref 27.0–33.0)
MCHC: 32 g/dL (ref 32.0–36.0)
MCV: 84.1 fL (ref 80.0–100.0)
MPV: 10.3 fL (ref 7.5–12.5)
Monocytes Relative: 8.2 %
Neutro Abs: 3008 cells/uL (ref 1500–7800)
Neutrophils Relative %: 43.6 %
Platelets: 222 10*3/uL (ref 140–400)
RBC: 5.09 10*6/uL (ref 3.80–5.10)
RDW: 14.6 % (ref 11.0–15.0)
Total Lymphocyte: 44.9 %
WBC: 6.9 10*3/uL (ref 3.8–10.8)

## 2020-05-18 ENCOUNTER — Encounter: Payer: Self-pay | Admitting: Family Medicine

## 2020-05-18 NOTE — Assessment & Plan Note (Signed)
Continue pain meds per pain management

## 2020-05-18 NOTE — Assessment & Plan Note (Addendum)
Controlled no changes, no meds taken this AM

## 2020-05-18 NOTE — Assessment & Plan Note (Signed)
Continue trulicity Check M1Y goal < 7% Discussed importance of checking CBG

## 2020-05-19 ENCOUNTER — Encounter: Payer: Self-pay | Admitting: *Deleted

## 2020-05-19 MED ORDER — LANCETS MISC: 1 refills | Status: AC

## 2020-05-19 MED ORDER — BLOOD GLUCOSE TEST VI STRP: ORAL_STRIP | 1 refills | Status: AC

## 2020-05-19 MED ORDER — BLOOD GLUCOSE SYSTEM PAK KIT: PACK | 1 refills | Status: AC

## 2020-05-26 ENCOUNTER — Telehealth (HOSPITAL_COMMUNITY): Payer: Medicare HMO | Admitting: Psychiatry

## 2020-05-26 ENCOUNTER — Other Ambulatory Visit: Payer: Self-pay

## 2020-05-26 DIAGNOSIS — F4310 Post-traumatic stress disorder, unspecified: Secondary | ICD-10-CM

## 2020-05-26 DIAGNOSIS — F411 Generalized anxiety disorder: Secondary | ICD-10-CM

## 2020-05-26 DIAGNOSIS — F333 Major depressive disorder, recurrent, severe with psychotic symptoms: Secondary | ICD-10-CM

## 2020-05-26 DIAGNOSIS — G4701 Insomnia due to medical condition: Secondary | ICD-10-CM

## 2020-05-26 MED ORDER — LITHIUM CARBONATE ER 300 MG PO TBCR: 600.0000 mg | EXTENDED_RELEASE_TABLET | Freq: Two times a day (BID) | ORAL | 2 refills | Status: DC

## 2020-05-26 MED ORDER — HYDROXYZINE HCL 50 MG PO TABS: 50.0000 mg | ORAL_TABLET | Freq: Three times a day (TID) | ORAL | 2 refills | Status: DC | PRN

## 2020-05-26 MED ORDER — MIRTAZAPINE 45 MG PO TABS: 45.0000 mg | ORAL_TABLET | Freq: Every day | ORAL | 2 refills | Status: DC

## 2020-05-26 NOTE — Progress Notes (Unsigned)
Virtual Visit via Telephone Note  I connected with Melody Jenkins on 05/26/20 at  1:00 PM EST by telephone and verified that I am speaking with the correct person using two identifiers.  Location: Patient: home Provider: office   I discussed the limitations, risks, security and privacy concerns of performing an evaluation and management service by telephone and the availability of in person appointments. I also discussed with the patient that there may be a patient responsible charge related to this service. The patient expressed understanding and agreed to proceed.   History of Present Illness: Anxiety, stress, overwhelmed  Stressors- health, finances, law suit,   Observations/Objective:  General Appearance: unable to assess  Eye Contact:  unable to assess  Speech:  {Speech:22685}  Volume:  {Volume (PAA):22686}  Mood:  {BHH MOOD:22306}  Affect:  {Affect (PAA):22687}  Thought Process:  {Thought Process (PAA):22688}  Orientation:  {BHH ORIENTATION (PAA):22689}  Thought Content:  {Thought Content:22690}  Suicidal Thoughts:  {ST/HT (PAA):22692}  Homicidal Thoughts:  {ST/HT (PAA):22692}  Memory:  {BHH MEMORY:22881}  Judgement:  {Judgement (PAA):22694}  Insight:  {Insight (PAA):22695}  Psychomotor Activity: unable to assess  Concentration:  {Concentration:21399}  Recall:  {BHH GOOD/FAIR/POOR:22877}  Fund of Knowledge:  {BHH GOOD/FAIR/POOR:22877}  Language:  {BHH GOOD/FAIR/POOR:22877}  Akathisia:  unable to assess  Handed:  {Handed:22697}  AIMS (if indicated):     Assets:  {Assets (PAA):22698}  ADL's:  unable to assess  Cognition:  {chl bhh cognition:304700322}  Sleep:         Assessment and Plan: 1. GAD (generalized anxiety disorder) - hydrOXYzine (ATARAX/VISTARIL) 50 MG tablet; Take 1 tablet (50 mg total) by mouth 3 (three) times daily as needed for anxiety.  Dispense: 90 tablet; Refill: 2 - mirtazapine (REMERON) 45 MG tablet; Take 1 tablet (45 mg total) by mouth  at bedtime.  Dispense: 30 tablet; Refill: 2  2. MDD (major depressive disorder), recurrent, severe, with psychosis ([place]) - lithium carbonate (LITHOBID) 300 MG CR tablet; Take 2 tablets (600 mg total) by mouth 2 (two) times daily.  Dispense: 120 tablet; Refill: 2 - mirtazapine (REMERON) 45 MG tablet; Take 1 tablet (45 mg total) by mouth at bedtime.  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  Dispense: 30 tablet; Refill: 2  4. PTSD (post-traumatic stress disorder) - mirtazapine (REMERON) 45 MG tablet; Take 1 tablet (45 mg total) by mouth at bedtime.  Dispense: 30 tablet; Refill: 2    Follow Up Instructions: In 4-10 weeks or sooner if needed   I discussed the assessment and treatment plan with the patient. The patient was provided an opportunity to ask questions and all were answered. The patient agreed with the plan and demonstrated an understanding of the instructions.   The patient was advised to call back or seek an in-person evaluation if the symptoms worsen or if the condition fails to improve as anticipated.  I provided 20 minutes of non-face-to-face time during this encounter.   Charlcie Cradle, MD

## 2020-06-07 ENCOUNTER — Encounter: Payer: Self-pay | Admitting: Family Medicine

## 2020-06-09 ENCOUNTER — Other Ambulatory Visit: Payer: Self-pay

## 2020-06-09 ENCOUNTER — Ambulatory Visit
Admission: RE | Admit: 2020-06-09 | Discharge: 2020-06-09 | Disposition: A | Discharge status: Home / Self Care | Payer: Medicare HMO | Source: Ambulatory Visit | Attending: Nurse Practitioner | Admitting: Nurse Practitioner

## 2020-06-09 ENCOUNTER — Encounter: Payer: Self-pay | Admitting: Nurse Practitioner

## 2020-06-09 ENCOUNTER — Ambulatory Visit (INDEPENDENT_AMBULATORY_CARE_PROVIDER_SITE_OTHER): Payer: Medicare HMO | Admitting: Nurse Practitioner

## 2020-06-09 VITALS — BP 144/92 | HR 88 | Temp 97.2°F | Ht 67.5 in | Wt 229.4 lb

## 2020-06-09 DIAGNOSIS — R059 Cough, unspecified: Secondary | ICD-10-CM | POA: Insufficient documentation

## 2020-06-09 DIAGNOSIS — E785 Hyperlipidemia, unspecified: Secondary | ICD-10-CM | POA: Diagnosis not present

## 2020-06-09 DIAGNOSIS — E1169 Type 2 diabetes mellitus with other specified complication: Secondary | ICD-10-CM

## 2020-06-09 MED ORDER — GUAIFENESIN ER 600 MG PO TB12: 600.0000 mg | ORAL_TABLET | Freq: Two times a day (BID) | ORAL | 0 refills | Status: DC | PRN

## 2020-06-09 MED ORDER — PREDNISONE 20 MG PO TABS: 20.0000 mg | ORAL_TABLET | Freq: Every day | ORAL | 0 refills | Status: DC

## 2020-06-09 MED ORDER — TRULICITY 0.75 MG/0.5ML ~~LOC~~ SOAJ: 0.7500 mg | SUBCUTANEOUS | 11 refills | Status: DC

## 2020-06-09 NOTE — Assessment & Plan Note (Addendum)
Acute, ongoing x weeks.  Given length of symptoms and history of smoking and asthma, worried about pneumonia vs. ?COPD exacerbation.  Has been tested for COVID earlier this week; gets weekly tests with [facility].  Will obtain chest x-ray to rule out pneumonia and in meantime, start prednisone and guaifenesin.  Great response to albuterol nebulizer in office - air movement much increased and less chest tightness/shortness of breath.  Continue SABA as needed for now.  Return to clinic pending chest x-ray.

## 2020-06-09 NOTE — Assessment & Plan Note (Signed)
Refill given for Trulicity.

## 2020-06-09 NOTE — Progress Notes (Signed)
Subjective:    Patient ID: Melody Jenkins, female    DOB: [birth year], 54 y.o.   MRN: [id number]  HPI: Melody Jenkins is a 54 y.o. female presenting for cough and shortness of breath.  Chief Complaint  Patient presents with  . Cough  . Shortness of Breath    Coughing and sob, thinks it may be due to the temp changes. Gets tested for covid weekly, using albuterol and zyrtec for sx. Started feeling this way  2 wks ago after flu shot   UPPER RESPIRATORY TRACT INFECTION Onset: 2 weeks, ran out of vitamin C and vitamin D Worst symptom: cough, trouble taking a deep breath in. Fever: no Cough: yes; productive Shortness of breath: yes; cannot take a deep breath Wheezing: no Chest pain: no Chest tightness: yes Chest congestion: yes Nasal congestion: no Runny nose: no Post nasal drip: no Sneezing: no Sore throat: yes on left side, is better Swollen glands: yes Sinus pressure: no Headache: no Face pain: no Toothache: no Ear pain: no Ear pressure: no  Eyes red/itching:no Eye drainage/crusting: no  Nausea: no Vomiting: no Diarrhea: no Change in appetite: no Loss of taste/smell: no Rash: no Fatigue: yes Sick contacts: no Strep contacts: no  Context: fluctuating Recurrent sinusitis: no Treatments attempted: hot tea with lemon, zyrtec, albuterol Relief with OTC medications:  Allergies  Allergen Reactions  . Trazodone And Nefazodone Hives  . Tape Rash    Outpatient Encounter Medications as of 06/09/2020  Medication Sig  . albuterol (VENTOLIN HFA) 108 (90 Base) MCG/ACT inhaler INHALE 2 PUFFS INTO THE LUNGS EVERY 4 HOURS AS NEEDED FOR WHEEZING OR SHORTNESS OF BREATH  . Biotin 5000 MCG CAPS Take 1 capsule by mouth.  . Blood Glucose Monitoring Suppl (BLOOD GLUCOSE SYSTEM PAK) KIT Please dispense based on patient and insurance preference. Use as directed to monitor FSBS 2x daily. Dx: E11.9  . Cholecalciferol (VITAMIN D3) 25 MCG (1000 UT) CAPS Take by mouth 2  (two) times daily.  . cloNIDine (CATAPRES) 0.2 MG tablet Take 1 tablet (0.2 mg total) by mouth 2 (two) times daily.  . Cyanocobalamin (VITAMIN B 12 PO) Take 1 tablet by mouth daily.   . cyclobenzaprine (FLEXERIL) 10 MG tablet Take 1 tablet (10 mg total) by mouth 3 (three) times daily as needed for muscle spasms.  Marland Kitchen ELDERBERRY PO Take by mouth.  . furosemide (LASIX) 20 MG tablet Take 1 tablet (20 mg total) by mouth daily.  Marland Kitchen gabapentin (NEURONTIN) 300 MG capsule Take 600 mg by mouth 3 (three) times daily.  . Glucose Blood (BLOOD GLUCOSE TEST STRIPS) STRP Please dispense based on patient and insurance preference. Use as directed to monitor FSBS 2x daily. Dx: E11.9  . guaiFENesin (MUCINEX) 600 MG 12 hr tablet Take 1 tablet (600 mg total) by mouth 2 (two) times daily as needed for cough or to loosen phlegm.  . hydrocortisone 2.5 % ointment Apply topically 2 (two) times daily. To affected areas on face  . hydrOXYzine (ATARAX/VISTARIL) 50 MG tablet Take 1 tablet (50 mg total) by mouth 3 (three) times daily as needed for anxiety.  . Lancets MISC Please dispense based on patient and insurance preference. Use as directed to monitor FSBS 2x daily. Dx: E11.9  . lithium carbonate (LITHOBID) 300 MG CR tablet Take 2 tablets (600 mg total) by mouth 2 (two) times daily.  . meloxicam (MOBIC) 7.5 MG tablet Take 7.5 mg by mouth 2 (two) times daily.  . metoprolol succinate (TOPROL-XL) 100  MG 24 hr tablet TAKE 1 TABLET (100 MG TOTAL) BY MOUTH DAILY. TAKE WITH OR IMMEDIATELY FOLLOWING A MEAL.  . mirtazapine (REMERON) 45 MG tablet Take 1 tablet (45 mg total) by mouth at bedtime.  Marland Kitchen MOTEGRITY 2 MG TABS TAKE 1 TABLET BY MOUTH DAILY.  Marland Kitchen oxyCODONE-acetaminophen (PERCOCET/ROXICET) 5-325 MG tablet Take by mouth 3 (three) times daily.  . predniSONE (DELTASONE) 20 MG tablet Take 1 tablet (20 mg total) by mouth daily with breakfast. Take 3 pills daily for 2 days, then 2 pills daily for 2 days, then 1 pill daily for 1 day.  .  rosuvastatin (CRESTOR) 10 MG tablet TAKE 1 TABLET (10 MG TOTAL) BY MOUTH DAILY.  . Turmeric (QC TUMERIC COMPLEX PO) Take by mouth.  . vitamin C (ASCORBIC ACID) 500 MG tablet Take 500 mg by mouth daily.  . vitamin E 180 MG (400 UNITS) capsule Take 400 Units by mouth daily.  . [DISCONTINUED] Dulaglutide (TRULICITY) 1.60 VP/7.1GG SOPN Inject 0.75 mg into the skin once a week.  . Dulaglutide (TRULICITY) 2.69 SW/5.4OE SOPN Inject 0.75 mg into the skin once a week.   No facility-administered encounter medications on file as of 06/09/2020.    Patient Active Problem List   Diagnosis Date Noted  . Cough 06/09/2020  . Status post right rotator cuff repair 10/22/2019  . Nontraumatic complete tear of right rotator cuff 04/29/2019  . 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 Trigger index finger of right hand 11/20/2018  . Arthritis of carpometacarpal [facility]) joint of left thumb 11/20/2018  . Vitamin D deficiency 11/19/2018  . Chronic back pain   . Special screening for malignant neoplasms, colon 08/16/2017  . Type 2 diabetes mellitus with hyperlipidemia ([place]) 02/13/2017  . Leg swelling 02/13/2017  . Chronic constipation 11/20/2016  . Tear of MCL (medial collateral ligament) of knee, left, subsequent encounter 04/06/2016  . Anterolisthesis 04/06/2016  . Situational mixed anxiety and depressive disorder 02/26/2016  . Insomnia 02/24/2016  . Back pain with radiation 02/14/2016  . Essential hypertension, benign 12/26/2015  . DDD (degenerative disc disease), lumbar 12/26/2015  . Seasonal allergies 12/26/2015  . Obesity 04/06/2015  . Anemia   . Asthma   . Depression   . Hyperlipidemia     Past Medical History:  Diagnosis Date  . Anemia   . Anxiety   . Asthma   . Carpal tunnel syndrome   . Chronic back pain   . Depression   . Essential hypertension   . History of diabetes mellitus   . History of multiple miscarriages   . Hyperlipidemia   . Torn rotator cuff     Relevant past medical, surgical,  family and social history reviewed and updated as indicated. Interim medical history since our last visit reviewed.  Review of Systems  Constitutional: Positive for fatigue. Negative for activity change, appetite change and unexpected weight change.  HENT: Positive for congestion and sore throat. Negative for dental problem, ear discharge, ear pain, nosebleeds, postnasal drip, rhinorrhea, sinus pressure, sinus pain, sneezing and trouble swallowing.   Eyes: Negative.  Negative for pain, discharge, redness and itching.  Respiratory: Positive for cough, chest tightness, shortness of breath and wheezing.   Cardiovascular: Negative.  Negative for chest pain.  Gastrointestinal: Negative.  Negative for constipation, diarrhea, nausea and vomiting.  Musculoskeletal: Negative.   Skin: Negative.  Negative for rash.  Neurological: Negative.  Negative for headaches.  Hematological: Positive for adenopathy.  Psychiatric/Behavioral: Negative.     Per HPI unless specifically indicated above  Objective:    BP (!) 144/92   Pulse 88   Temp (!) 97.2 F (36.2 C)   Ht 5' 7.5" (1.715 m)   Wt 229 lb 6.4 oz (104.1 kg)   LMP 12/17/2017   SpO2 100%   BMI 35.40 kg/m   Wt Readings from Last 3 Encounters:  06/09/20 229 lb 6.4 oz (104.1 kg)  05/16/20 232 lb (105.2 kg)  01/28/20 224 lb (101.6 kg)    Physical Exam Vitals and nursing note reviewed.  Constitutional:      General: She is not in acute distress.    Appearance: Normal appearance. She is not toxic-appearing.  HENT:     Head: Normocephalic and atraumatic.     Right Ear: Tympanic membrane, ear canal and external ear normal.     Left Ear: Tympanic membrane, ear canal and external ear normal.     Nose: Congestion present.     Right Turbinates: Enlarged and swollen.     Left Turbinates: Enlarged.     Right Sinus: No maxillary sinus tenderness or frontal sinus tenderness.     Left Sinus: No maxillary sinus tenderness or frontal sinus  tenderness.     Mouth/Throat:     Mouth: Mucous membranes are moist.     Pharynx: No oropharyngeal exudate or posterior oropharyngeal erythema.  Eyes:     General: No scleral icterus.       Right eye: No discharge.        Left eye: No discharge.     Extraocular Movements: Extraocular movements intact.  Cardiovascular:     Heart sounds: Normal heart sounds.  Pulmonary:     Effort: Pulmonary effort is normal. No respiratory distress.     Breath sounds: Normal air entry. No stridor. Wheezing present.     Comments: Decreased air movement and some wheezing pre-SABA Better air movement post-SABA with persistent wheezing Abdominal:     General: Abdomen is flat. Bowel sounds are normal. There is no distension.     Palpations: Abdomen is soft.  Musculoskeletal:     Cervical back: Normal range of motion.  Lymphadenopathy:     Cervical: No cervical adenopathy.  Skin:    General: Skin is warm and dry.     Coloration: Skin is not jaundiced or pale.     Findings: No erythema.  Neurological:     Mental Status: She is alert and oriented to person, place, and time.  Psychiatric:        Mood and Affect: Mood normal.        Behavior: Behavior normal.        Thought Content: Thought content normal.        Judgment: Judgment normal.        Assessment & Plan:   Problem List Items Addressed This Visit      Endocrine   Type 2 diabetes mellitus with hyperlipidemia ([place]) - Primary    Refill given for Trulicity.      Relevant Medications   Dulaglutide (TRULICITY) 6.29 BM/8.4XL SOPN     Other   Cough    Acute, ongoing x weeks.  Given length of symptoms and history of smoking and asthma, worried about pneumonia vs. ?COPD exacerbation.  Has been tested for COVID earlier this week; gets weekly tests with [facility].  Will obtain chest x-ray to rule out pneumonia and in meantime, start prednisone and guaifenesin.  Great response to albuterol nebulizer in office - air movement much increased and less  chest tightness/shortness of  breath.      Relevant Medications   guaiFENesin (MUCINEX) 600 MG 12 hr tablet   predniSONE (DELTASONE) 20 MG tablet   Other Relevant Orders   DG Chest 2 View       Follow up plan: Return if symptoms worsen or fail to improve.   30+ minutes spent with patient today.

## 2020-07-12 DIAGNOSIS — M5440 Lumbago with sciatica, unspecified side: Secondary | ICD-10-CM | POA: Diagnosis not present

## 2020-07-12 DIAGNOSIS — F1120 Opioid dependence, uncomplicated: Secondary | ICD-10-CM | POA: Diagnosis not present

## 2020-07-12 DIAGNOSIS — M5416 Radiculopathy, lumbar region: Secondary | ICD-10-CM | POA: Diagnosis not present

## 2020-07-12 DIAGNOSIS — M47816 Spondylosis without myelopathy or radiculopathy, lumbar region: Secondary | ICD-10-CM | POA: Diagnosis not present

## 2020-07-22 DIAGNOSIS — M47816 Spondylosis without myelopathy or radiculopathy, lumbar region: Secondary | ICD-10-CM | POA: Diagnosis not present

## 2020-08-04 ENCOUNTER — Telehealth (INDEPENDENT_AMBULATORY_CARE_PROVIDER_SITE_OTHER): Payer: Medicare HMO | Admitting: Psychiatry

## 2020-08-04 ENCOUNTER — Other Ambulatory Visit: Payer: Self-pay

## 2020-08-04 DIAGNOSIS — F4310 Post-traumatic stress disorder, unspecified: Secondary | ICD-10-CM

## 2020-08-04 DIAGNOSIS — F333 Major depressive disorder, recurrent, severe with psychotic symptoms: Secondary | ICD-10-CM

## 2020-08-04 DIAGNOSIS — G4701 Insomnia due to medical condition: Secondary | ICD-10-CM

## 2020-08-04 DIAGNOSIS — F411 Generalized anxiety disorder: Secondary | ICD-10-CM | POA: Diagnosis not present

## 2020-08-04 MED ORDER — HYDROXYZINE HCL 50 MG PO TABS: 50.0000 mg | ORAL_TABLET | Freq: Three times a day (TID) | ORAL | 2 refills | Status: DC | PRN

## 2020-08-04 MED ORDER — LITHIUM CARBONATE ER 300 MG PO TBCR
600.0000 mg | EXTENDED_RELEASE_TABLET | Freq: Two times a day (BID) | ORAL | 2 refills | Status: DC
Start: 2020-08-04 — End: 2020-09-08

## 2020-08-04 MED ORDER — MIRTAZAPINE 45 MG PO TABS: 45.0000 mg | ORAL_TABLET | Freq: Every day | ORAL | 2 refills | Status: DC

## 2020-08-04 NOTE — Progress Notes (Signed)
Virtual Visit via Telephone Note  I connected with Melody Jenkins on 08/04/20 at  1:00 PM EST by telephone and verified that I am speaking with the correct person using two identifiers.  Location: Patient: home Provider: office   I discussed the limitations, risks, security and privacy concerns of performing an evaluation and management service by telephone and the availability of in person appointments. I also discussed with the patient that there may be a patient responsible charge related to this service. The patient expressed understanding and agreed to proceed.   History of Present Illness: Melody Jenkins shares that she is having multiple stressors. One of the main ones being the help her family is asking of her. It is overwhelming and Melody Jenkins does not feel she is able to give it and manage her own problems. Her depression is largely unchanged. She denies SI/HI.    Observations/Objective:  General Appearance: unable to assess  Eye Contact:  unable to assess  Speech:  Clear and Coherent and Normal Rate  Volume:  Normal  Mood:  Anxious and Depressed  Affect:  Congruent  Thought Process:  Coherent and Descriptions of Associations: Circumstantial  Orientation:  Full (Time, Place, and Person)  Thought Content:  Rumination  Suicidal Thoughts:  No  Homicidal Thoughts:  No  Memory:  Immediate;   Good  Judgement:  Good  Insight:  Good  Psychomotor Activity: unable to assess  Concentration:  Concentration: Good  Recall:  Good  Fund of Knowledge:  Good  Language:  Good  Akathisia:  unable to assess  Handed:  Right  AIMS (if indicated):     Assets:  Communication Skills Desire for Improvement Financial Resources/Insurance Housing Talents/Skills Transportation Vocational/Educational  ADL's:  unable to assess  Cognition:  WNL  Sleep:         Assessment and Plan: 1. GAD (generalized anxiety disorder) - hydrOXYzine (ATARAX/VISTARIL) 50 MG tablet; Take 1 tablet (50 mg  total) by mouth 3 (three) times daily as needed for anxiety.  Dispense: 90 tablet; Refill: 2 - mirtazapine (REMERON) 45 MG tablet; Take 1 tablet (45 mg total) by mouth at bedtime.  Dispense: 30 tablet; Refill: 2  2. MDD (major depressive disorder), recurrent, severe, with psychosis ([place]) - lithium carbonate (LITHOBID) 300 MG CR tablet; Take 2 tablets (600 mg total) by mouth 2 (two) times daily.  Dispense: 120 tablet; Refill: 2 - mirtazapine (REMERON) 45 MG tablet; Take 1 tablet (45 mg total) by mouth at bedtime.  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  Dispense: 30 tablet; Refill: 2  4. PTSD (post-traumatic stress disorder) - mirtazapine (REMERON) 45 MG tablet; Take 1 tablet (45 mg total) by mouth at bedtime.  Dispense: 30 tablet; Refill: 2   ** order labs at next visit  - encouraged to restart therapy  - discussed the importance of self care and saying "no".  Follow Up Instructions: In 1-2 months or sooner if needed   I discussed the assessment and treatment plan with the patient. The patient was provided an opportunity to ask questions and all were answered. The patient agreed with the plan and demonstrated an understanding of the instructions.   The patient was advised to call back or seek an in-person evaluation if the symptoms worsen or if the condition fails to improve as anticipated.  I provided 30 minutes of non-face-to-face time during this encounter.  Charlcie Cradle, MD

## 2020-08-17 ENCOUNTER — Ambulatory Visit (INDEPENDENT_AMBULATORY_CARE_PROVIDER_SITE_OTHER): Payer: Medicare HMO | Admitting: Internal Medicine

## 2020-08-17 ENCOUNTER — Ambulatory Visit
Admission: RE | Admit: 2020-08-17 | Discharge: 2020-08-17 | Disposition: A | Discharge status: Home / Self Care | Payer: Medicare HMO | Source: Ambulatory Visit | Attending: Family Medicine | Admitting: Family Medicine

## 2020-08-17 ENCOUNTER — Encounter: Payer: Self-pay | Admitting: Internal Medicine

## 2020-08-17 ENCOUNTER — Other Ambulatory Visit: Payer: Self-pay | Admitting: Internal Medicine

## 2020-08-17 ENCOUNTER — Other Ambulatory Visit: Payer: Self-pay

## 2020-08-17 VITALS — BP 158/98 | HR 90 | Temp 97.8°F | Resp 16 | Ht 67.5 in | Wt 234.0 lb

## 2020-08-17 DIAGNOSIS — R9431 Abnormal electrocardiogram [ECG] [EKG]: Secondary | ICD-10-CM | POA: Diagnosis not present

## 2020-08-17 DIAGNOSIS — I10 Essential (primary) hypertension: Secondary | ICD-10-CM | POA: Diagnosis not present

## 2020-08-17 DIAGNOSIS — E559 Vitamin D deficiency, unspecified: Secondary | ICD-10-CM

## 2020-08-17 DIAGNOSIS — F4323 Adjustment disorder with mixed anxiety and depressed mood: Secondary | ICD-10-CM

## 2020-08-17 DIAGNOSIS — Z0000 Encounter for general adult medical examination without abnormal findings: Secondary | ICD-10-CM

## 2020-08-17 DIAGNOSIS — E785 Hyperlipidemia, unspecified: Secondary | ICD-10-CM | POA: Diagnosis not present

## 2020-08-17 DIAGNOSIS — Z1231 Encounter for screening mammogram for malignant neoplasm of breast: Secondary | ICD-10-CM

## 2020-08-17 DIAGNOSIS — Z5181 Encounter for therapeutic drug level monitoring: Secondary | ICD-10-CM | POA: Diagnosis not present

## 2020-08-17 DIAGNOSIS — E1169 Type 2 diabetes mellitus with other specified complication: Secondary | ICD-10-CM | POA: Diagnosis not present

## 2020-08-17 DIAGNOSIS — Z0001 Encounter for general adult medical examination with abnormal findings: Secondary | ICD-10-CM

## 2020-08-17 LAB — CBC WITH DIFFERENTIAL/PLATELET
Basophils Absolute: 0 10*3/uL (ref 0.0–0.1)
Basophils Relative: 0.7 % (ref 0.0–3.0)
Eosinophils Absolute: 0.1 10*3/uL (ref 0.0–0.7)
Eosinophils Relative: 1.5 % (ref 0.0–5.0)
HCT: 43.9 % (ref 36.0–46.0)
Hemoglobin: 14.3 g/dL (ref 12.0–15.0)
Lymphocytes Relative: 38.3 % (ref 12.0–46.0)
Lymphs Abs: 2.6 10*3/uL (ref 0.7–4.0)
MCHC: 32.6 g/dL (ref 30.0–36.0)
MCV: 83.1 fl (ref 78.0–100.0)
Monocytes Absolute: 0.6 10*3/uL (ref 0.1–1.0)
Monocytes Relative: 8.8 % (ref 3.0–12.0)
Neutro Abs: 3.4 10*3/uL (ref 1.4–7.7)
Neutrophils Relative %: 50.7 % (ref 43.0–77.0)
Platelets: 206 10*3/uL (ref 150.0–400.0)
RBC: 5.28 Mil/uL — ABNORMAL HIGH (ref 3.87–5.11)
RDW: 14.9 % (ref 11.5–15.5)
WBC: 6.8 10*3/uL (ref 4.0–10.5)

## 2020-08-17 LAB — BASIC METABOLIC PANEL
BUN: 20 mg/dL (ref 6–23)
CO2: 30 mEq/L (ref 19–32)
Calcium: 9.6 mg/dL (ref 8.4–10.5)
Chloride: 105 mEq/L (ref 96–112)
Creatinine, Ser: 0.87 mg/dL (ref 0.40–1.20)
GFR: 74.96 mL/min (ref >60.00)
Glucose, Bld: 96 mg/dL (ref 70–99)
Potassium: 3.8 mEq/L (ref 3.5–5.1)
Sodium: 142 mEq/L (ref 135–145)

## 2020-08-17 LAB — TSH: TSH: 0.74 u[IU]/mL (ref 0.35–4.50)

## 2020-08-17 LAB — VITAMIN D 25 HYDROXY (VIT D DEFICIENCY, FRACTURES): VITD: 46.9 ng/mL (ref 30.00–100.00)

## 2020-08-17 LAB — HEMOGLOBIN A1C: Hgb A1c MFr Bld: 6.1 % (ref 4.6–6.5)

## 2020-08-17 LAB — HM MAMMOGRAPHY

## 2020-08-17 MED ORDER — INDAPAMIDE 1.25 MG PO TABS
1.2500 mg | ORAL_TABLET | Freq: Every day | ORAL | 0 refills | Status: DC
Start: 2020-08-17 — End: 2021-02-02

## 2020-08-17 NOTE — Progress Notes (Signed)
Subjective:  Patient ID: Melody Jenkins, female    DOB: Oct 26, 1965  Age: 55 y.o. MRN: [id number]  CC: Annual Exam, Hypertension, and Diabetes  This visit occurred during the SARS-CoV-2 public health emergency.  Safety protocols were in place, including screening questions prior to the visit, additional usage of staff PPE, and extensive cleaning of exam room while observing appropriate contact time as indicated for disinfecting solutions.    HPI Geneva Barrero presents for a CPX and to establish.  She does not monitor her BP. She does not exercise but she is active and denies CP, palpitations, edema, fatigue.  History Darleene has a past medical history of Anemia, Anxiety, Asthma, Carpal tunnel syndrome, Chronic back pain, Depression, Essential hypertension, History of diabetes mellitus, History of multiple miscarriages, Hyperlipidemia, and Torn rotator cuff.   She has a past surgical history that includes Cosmetic surgery (2000); Breast surgery (2000); Cesarean section; Reduction mammaplasty (Bilateral); Carpal tunnel release; and Rotator cuff repair (10/2019).   Her family history includes Breast cancer (age of onset: 58) in her mother; Diabetes in her mother; Early death in her brother; Heart disease in her brother and father; Hypertension in her father and mother; Kidney disease in her maternal grandmother.She reports that she quit smoking about 2 years ago. Her smoking use included cigarettes. She has a 7.50 pack-year smoking history. She has never used smokeless tobacco. She reports that she does not drink alcohol and does not use drugs.  Outpatient Medications Prior to Visit  Medication Sig Dispense Refill  . Biotin 5000 MCG CAPS Take 1 capsule by mouth.    . Blood Glucose Monitoring Suppl (BLOOD GLUCOSE SYSTEM PAK) KIT Please dispense based on patient and insurance preference. Use as directed to monitor FSBS 2x daily. Dx: E11.9 1 kit 1  . Cholecalciferol (VITAMIN D3) 25 MCG  (1000 UT) CAPS Take by mouth 2 (two) times daily.    . cloNIDine (CATAPRES) 0.2 MG tablet Take 1 tablet (0.2 mg total) by mouth 2 (two) times daily. 180 tablet 3  . Cyanocobalamin (VITAMIN B 12 PO) Take 1 tablet by mouth daily.     . Dulaglutide (TRULICITY) 7.51 WC/5.8NI SOPN Inject 0.75 mg into the skin once a week. 4 mL 11  . ELDERBERRY PO Take by mouth.    . gabapentin (NEURONTIN) 300 MG capsule Take 600 mg by mouth 3 (three) times daily.    . Glucose Blood (BLOOD GLUCOSE TEST STRIPS) STRP Please dispense based on patient and insurance preference. Use as directed to monitor FSBS 2x daily. Dx: E11.9 200 strip 1  . hydrocortisone 2.5 % ointment Apply topically 2 (two) times daily. To affected areas on face 60 g 2  . hydrOXYzine (ATARAX/VISTARIL) 50 MG tablet Take 1 tablet (50 mg total) by mouth 3 (three) times daily as needed for anxiety. 90 tablet 2  . Lancets MISC Please dispense based on patient and insurance preference. Use as directed to monitor FSBS 2x daily. Dx: E11.9 200 each 1  . lithium carbonate (LITHOBID) 300 MG CR tablet Take 2 tablets (600 mg total) by mouth 2 (two) times daily. 120 tablet 2  . meloxicam (MOBIC) 7.5 MG tablet Take 7.5 mg by mouth 2 (two) times daily.  5  . metoprolol succinate (TOPROL-XL) 100 MG 24 hr tablet TAKE 1 TABLET (100 MG TOTAL) BY MOUTH DAILY. TAKE WITH OR IMMEDIATELY FOLLOWING A MEAL. 90 tablet 3  . mirtazapine (REMERON) 45 MG tablet Take 1 tablet (45 mg total) by mouth at  bedtime. 30 tablet 2  . MOTEGRITY 2 MG TABS TAKE 1 TABLET BY MOUTH DAILY. 30 tablet 5  . oxyCODONE-acetaminophen (PERCOCET/ROXICET) 5-325 MG tablet Take by mouth 3 (three) times daily.    . predniSONE (DELTASONE) 20 MG tablet Take 1 tablet (20 mg total) by mouth daily with breakfast. Take 3 pills daily for 2 days, then 2 pills daily for 2 days, then 1 pill daily for 1 day. 12 tablet 0  . rosuvastatin (CRESTOR) 10 MG tablet TAKE 1 TABLET (10 MG TOTAL) BY MOUTH DAILY. 90 tablet 1  .  Turmeric (QC TUMERIC COMPLEX PO) Take by mouth.    . vitamin C (ASCORBIC ACID) 500 MG tablet Take 500 mg by mouth daily.    Marland Kitchen albuterol (VENTOLIN HFA) 108 (90 Base) MCG/ACT inhaler INHALE 2 PUFFS INTO THE LUNGS EVERY 4 HOURS AS NEEDED FOR WHEEZING OR SHORTNESS OF BREATH 18 g 5  . cyclobenzaprine (FLEXERIL) 10 MG tablet Take 1 tablet (10 mg total) by mouth 3 (three) times daily as needed for muscle spasms. 30 tablet 0  . furosemide (LASIX) 20 MG tablet Take 1 tablet (20 mg total) by mouth daily. 90 tablet 1  . guaiFENesin (MUCINEX) 600 MG 12 hr tablet Take 1 tablet (600 mg total) by mouth 2 (two) times daily as needed for cough or to loosen phlegm. 30 tablet 0  . vitamin E 180 MG (400 UNITS) capsule Take 400 Units by mouth daily.     No facility-administered medications prior to visit.    ROS Review of Systems  Constitutional: Positive for unexpected weight change (wt gain). Negative for chills, diaphoresis and fatigue.  HENT: Negative.   Eyes: Negative for visual disturbance.  Respiratory: Negative for cough, chest tightness, shortness of breath and wheezing.   Cardiovascular: Negative for chest pain, palpitations and leg swelling.  Gastrointestinal: Negative for abdominal pain, constipation, diarrhea, nausea and vomiting.  Endocrine: Negative.  Negative for polydipsia, polyphagia and polyuria.  Genitourinary: Negative.  Negative for difficulty urinating, dysuria and hematuria.  Musculoskeletal: Positive for arthralgias and back pain. Negative for myalgias.  Skin: Negative.  Negative for color change and pallor.  Neurological: Negative.  Negative for dizziness, weakness, light-headedness and numbness.  Hematological: Negative for adenopathy. Does not bruise/bleed easily.  Psychiatric/Behavioral: Positive for dysphoric mood and sleep disturbance. The patient is not nervous/anxious.     Objective:  BP (!) 158/98   Pulse 90   Temp 97.8 F (36.6 C) (Oral)   Resp 16   Ht 5' 7.5" (1.715  m)   Wt 234 lb (106.1 kg)   LMP 12/17/2017   SpO2 97%   BMI 36.11 kg/m   Physical Exam Vitals reviewed.  Constitutional:      Appearance: She is obese.  HENT:     Nose: Nose normal.     Mouth/Throat:     Mouth: Mucous membranes are moist.  Eyes:     General: No scleral icterus.    Conjunctiva/sclera: Conjunctivae normal.  Cardiovascular:     Rate and Rhythm: Normal rate and regular rhythm.     Heart sounds: Normal heart sounds, S1 normal and S2 normal. No murmur heard.     Comments: EKG- SR with 1st degree AV block QTc = 480 No LVH or Q waves Pulmonary:     Effort: Pulmonary effort is normal.     Breath sounds: No stridor. No wheezing or rhonchi.  Abdominal:     General: Abdomen is protuberant. Bowel sounds are normal. There is no  distension.     Palpations: Abdomen is soft. There is no hepatomegaly, splenomegaly or mass.     Tenderness: There is no abdominal tenderness.  Musculoskeletal:     Cervical back: Normal range of motion.     Right lower leg: No edema.     Left lower leg: No edema.  Lymphadenopathy:     Cervical: No cervical adenopathy.  Skin:    General: Skin is warm and dry.     Coloration: Skin is not pale.  Neurological:     General: No focal deficit present.     Mental Status: She is alert and oriented to person, place, and time. Mental status is at baseline.  Psychiatric:        Mood and Affect: Mood normal.        Behavior: Behavior normal.     Lab Results  Component Value Date   WBC 6.8 08/17/2020   HGB 14.3 08/17/2020   HCT 43.9 08/17/2020   PLT 206.0 08/17/2020   GLUCOSE 96 08/17/2020   CHOL 147 01/13/2020   TRIG 91 01/13/2020   HDL 40 (L) 01/13/2020   LDLDIRECT 87 05/27/2019   LDLCALC 88 01/13/2020   ALT 14 05/16/2020   AST 11 05/16/2020   NA 142 08/17/2020   K 3.8 08/17/2020   CL 105 08/17/2020   CREATININE 0.87 08/17/2020   BUN 20 08/17/2020   CO2 30 08/17/2020   TSH 0.74 08/17/2020   HGBA1C 6.1 08/17/2020   MICROALBUR 0.3  01/13/2020      Assessment & Plan:   Terilyn was seen today for annual exam, hypertension and diabetes.  Diagnoses and all orders for this visit:  Essential hypertension, benign- Her BP is not well controlled. Will add a thiazide diuretic. -     CBC with Differential/Platelet; Future -     Basic metabolic panel; Future -     TSH; Future -     VITAMIN D 25 Hydroxy (Vit-D Deficiency, Fractures); Future -     indapamide (LOZOL) 1.25 MG tablet; Take 1 tablet (1.25 mg total) by mouth daily. -     EKG 12-Lead -     VITAMIN D 25 Hydroxy (Vit-D Deficiency, Fractures) -     TSH -     Basic metabolic panel -     CBC with Differential/Platelet  Type 2 diabetes mellitus with hyperlipidemia ([place])- Her blood sugar is well controlled. -     Basic metabolic panel; Future -     Hemoglobin A1c; Future -     Ambulatory referral to Ophthalmology -     Hemoglobin A1c -     Basic metabolic panel  Vitamin D deficiency -     VITAMIN D 25 Hydroxy (Vit-D Deficiency, Fractures); Future -     VITAMIN D 25 Hydroxy (Vit-D Deficiency, Fractures)  Encounter for general adult medical examination with abnormal findings- Exam completed, labs reviewed, vaccines reviewed, cancer screenings are UTD, pt ed material was given.  Situational mixed anxiety and depressive disorder- She is not taking Lithium. -     Lithium level; Future -     Lithium level  Therapeutic drug monitoring -     Lithium level; Future -     Lithium level  Prolonged Q-T interval on ECG- I have asked her to see cardiology about this. -     EKG 12-Lead -     Ambulatory referral to Cardiology   I have discontinued Joaquim Lai L. Artley's cyclobenzaprine, vitamin E, albuterol, furosemide, and  guaiFENesin. I am also having her start on indapamide. Additionally, I am having her maintain her vitamin C, Cyanocobalamin (VITAMIN B 12 PO), Biotin, meloxicam, gabapentin, Vitamin D3, oxyCODONE-acetaminophen, Motegrity, Turmeric (QC TUMERIC COMPLEX PO),  ELDERBERRY PO, cloNIDine, metoprolol succinate, rosuvastatin, hydrocortisone, Blood Glucose System Pak, Lancets, BLOOD GLUCOSE TEST STRIPS, Trulicity, predniSONE, hydrOXYzine, lithium carbonate, and mirtazapine.  Meds ordered this encounter  Medications  . indapamide (LOZOL) 1.25 MG tablet    Sig: Take 1 tablet (1.25 mg total) by mouth daily.    Dispense:  90 tablet    Refill:  0     Follow-up: Return in about 3 months (around 11/17/2020).  Scarlette Calico, MD

## 2020-08-17 NOTE — Patient Instructions (Signed)

## 2020-08-18 ENCOUNTER — Encounter: Payer: Self-pay | Admitting: Internal Medicine

## 2020-08-18 LAB — LITHIUM LEVEL: Lithium Lvl: <0.3 mmol/L — ABNORMAL LOW (ref 0.6–1.2)

## 2020-09-04 NOTE — Progress Notes (Signed)
Cardiology Office Note:    Date:  09/06/2020   ID:  [name], DOB 09-09-1965, MRN [id number]  PCP:  Janith Lima, MD   [place]  Cardiologist:  Freada Bergeron, MD  Advanced Practice Provider:  No care team member to display Electrophysiologist:  None     Referring MD: Janith Lima, MD     History of Present Illness:    Melody Jenkins is a 55 y.o. female with a hx of HTN, DMII, HLD, multiple miscarriages, asthma and anxiety who was referred by Dr. Ronnald Ramp for long QT interval.  The patient states that she has a lot of health issues with chronic pain, anxiety, hemorrhoids and has been seeing a lot of specialists and is taking a lot of medications. She is trying to avoid to taking more medications. She was referred for prolonged Qtc noted on ECG. No known personal history of CAD or CHF, however, has a very strong family history as detailed below. She states she has occasional palpitations that occur intermittently but do not sustain. Had one episode of right sided chest pain while resting which improved while walking around and rubbing her chest; no exertional symptoms. No significant dizziness, lightheadedness or syncope. No known family history of SCD. Notably is on a lot of medications to treat pain, anxiety, depression and PTSD including lithium, gabapentin, hydroxyzine, pruclopride.   Family history: Brother with MI (25s), brother with CABG (59s), Father with massive MI (7s), mother with CAD and CHF.  Past Medical History:  Diagnosis Date  . Anemia   . Anxiety   . Asthma   . Carpal tunnel syndrome   . Chronic back pain   . Depression   . Essential hypertension   . History of diabetes mellitus   . History of multiple miscarriages   . Hyperlipidemia   . Torn rotator cuff     Past Surgical History:  Procedure Laterality Date  . BREAST SURGERY  2000   reduction  . CARPAL TUNNEL RELEASE     left  . CESAREAN SECTION    .  COSMETIC SURGERY  2000   breast reduction  . REDUCTION MAMMAPLASTY Bilateral   . ROTATOR CUFF REPAIR  10/2019    Current Medications: Current Meds  Medication Sig  . Biotin 5000 MCG CAPS Take 1 capsule by mouth.  . Blood Glucose Monitoring Suppl (BLOOD GLUCOSE SYSTEM PAK) KIT Please dispense based on patient and insurance preference. Use as directed to monitor FSBS 2x daily. Dx: E11.9  . Cholecalciferol (VITAMIN D3) 25 MCG (1000 UT) CAPS Take by mouth 2 (two) times daily.  . cloNIDine (CATAPRES) 0.2 MG tablet Take 1 tablet (0.2 mg total) by mouth 2 (two) times daily.  . Cyanocobalamin (VITAMIN B 12 PO) Take 1 tablet by mouth daily.   . Dulaglutide (TRULICITY) 6.16 WV/3.7TG SOPN Inject 0.75 mg into the skin once a week.  Marland Kitchen ELDERBERRY PO Take by mouth.  . gabapentin (NEURONTIN) 300 MG capsule Take 600 mg by mouth 3 (three) times daily.  . Glucose Blood (BLOOD GLUCOSE TEST STRIPS) STRP Please dispense based on patient and insurance preference. Use as directed to monitor FSBS 2x daily. Dx: E11.9  . hydrocortisone 2.5 % ointment Apply topically 2 (two) times daily. To affected areas on face  . hydrOXYzine (ATARAX/VISTARIL) 50 MG tablet Take 1 tablet (50 mg total) by mouth 3 (three) times daily as needed for anxiety.  . indapamide (LOZOL) 1.25 MG tablet Take 1  tablet (1.25 mg total) by mouth daily.  . Lancets MISC Please dispense based on patient and insurance preference. Use as directed to monitor FSBS 2x daily. Dx: E11.9  . lithium carbonate (LITHOBID) 300 MG CR tablet Take 2 tablets (600 mg total) by mouth 2 (two) times daily.  . meloxicam (MOBIC) 7.5 MG tablet Take 7.5 mg by mouth 2 (two) times daily.  . metoprolol succinate (TOPROL-XL) 100 MG 24 hr tablet TAKE 1 TABLET (100 MG TOTAL) BY MOUTH DAILY. TAKE WITH OR IMMEDIATELY FOLLOWING A MEAL.  . mirtazapine (REMERON) 45 MG tablet Take 1 tablet (45 mg total) by mouth at bedtime.  Marland Kitchen MOTEGRITY 2 MG TABS TAKE 1 TABLET BY MOUTH DAILY.  Marland Kitchen  oxyCODONE-acetaminophen (PERCOCET/ROXICET) 5-325 MG tablet Take by mouth 3 (three) times daily.  . rosuvastatin (CRESTOR) 10 MG tablet TAKE 1 TABLET (10 MG TOTAL) BY MOUTH DAILY.  . Turmeric (QC TUMERIC COMPLEX PO) Take by mouth.  . vitamin C (ASCORBIC ACID) 500 MG tablet Take 500 mg by mouth daily.     Allergies:   Trazodone and nefazodone and Tape   Social History   Socioeconomic History  . Marital status: Single    Spouse name: Not on file  . Number of children: 1  . Years of education: Not on file  . Highest education level: Not on file  Occupational History  . Occupation: disablity  Tobacco Use  . Smoking status: Former Smoker    Packs/day: 0.25    Years: 30.00    Pack years: 7.50    Types: Cigarettes    Quit date: 03/29/2018    Years since quitting: 2.4  . Smokeless tobacco: Never Used  Vaping Use  . Vaping Use: Never used  Substance and Sexual Activity  . Alcohol use: No  . Drug use: No  . Sexual activity: Not Currently    Partners: Male    Birth control/protection: Post-menopausal  Other Topics Concern  . Not on file  Social History Narrative  . Not on file   Social Determinants of Health   Financial Resource Strain: Not on file  Food Insecurity: Not on file  Transportation Needs: Not on file  Physical Activity: Not on file  Stress: Not on file  Social Connections: Not on file     Family History: The patient's family history includes Breast cancer (age of onset: 1) in her mother; Diabetes in her mother; Early death in her brother; Heart disease in her brother and father; Hypertension in her father and mother; Kidney disease in her maternal grandmother. There is no history of Colon cancer or Rectal cancer.  ROS:   Please see the history of present illness.    Review of Systems  Constitutional: Positive for malaise/fatigue. Negative for chills and fever.  HENT: Negative for hearing loss and sore throat.   Eyes: Negative for blurred vision and redness.   Respiratory: Negative for shortness of breath.   Cardiovascular: Positive for chest pain and palpitations. Negative for orthopnea, claudication, leg swelling and PND.  Gastrointestinal: Positive for abdominal pain.  Genitourinary: Negative for dysuria and flank pain.  Musculoskeletal: Positive for joint pain and myalgias.  Neurological: Negative for dizziness and loss of consciousness.  Endo/Heme/Allergies: Negative for polydipsia.  Psychiatric/Behavioral: Positive for depression. The patient is nervous/anxious.     EKGs/Labs/Other Studies Reviewed:    The following studies were reviewed today: 02/27/19: Summary:  Left: No reflux was noted in the common femoral vein , femoral vein in the  thigh, and  popliteal vein.   Abnormal reflux times were noted in the great saphenous vein at the  saphenofemoral junction, great saphenous vein at the proximal thigh, great  saphenous vein at the mid thigh, great saphenous vein at the distal thigh,  and great saphenous vein at the  knee.    There is no evidence of deep vein thrombosis in the lower extremity within  the CFV, FV, and POPV. There is no evidence of superficial venous  thrombosis within the GSV and SSV.   EKG:  EKG is  ordered today.  The ekg ordered today demonstrates NSR with HR 90, Qtc 484  Recent Labs: 05/16/2020: ALT 14 08/17/2020: BUN 20; Creatinine, Ser 0.87; Hemoglobin 14.3; Platelets 206.0; Potassium 3.8; Sodium 142; TSH 0.74  Recent Lipid Panel    Component Value Date/Time   CHOL 147 01/13/2020 0832   TRIG 91 01/13/2020 0832   HDL 40 (L) 01/13/2020 0832   CHOLHDL 3.7 01/13/2020 0832   VLDL 29 03/05/2016 0912   LDLCALC 88 01/13/2020 0832   LDLDIRECT 87 05/27/2019 1432     Risk Assessment/Calculations:       Physical Exam:    VS:  BP 130/90   Pulse 90   Ht 5' 7.5" (1.715 m)   Wt 226 lb 6.4 oz (102.7 kg)   LMP 12/17/2017   SpO2 93%   BMI 34.94 kg/m     Wt Readings from Last 3 Encounters:  09/06/20 226  lb 6.4 oz (102.7 kg)  08/17/20 234 lb (106.1 kg)  06/09/20 229 lb 6.4 oz (104.1 kg)     GEN:  Well nourished, well developed in no acute distress HEENT: Normal NECK: No JVD; No carotid bruits LYMPHATICS: No lymphadenopathy CARDIAC: RRR, 2/6 systolic murmur. No rubs, gallops RESPIRATORY:  Clear to auscultation without rales, wheezing or rhonchi  ABDOMEN: Soft, non-tender, non-distended MUSCULOSKELETAL:  No edema; No deformity  SKIN: Warm and dry NEUROLOGIC:  Alert and oriented x 3 PSYCHIATRIC:  Normal affect   ASSESSMENT:    1. Prolonged Q-T interval on ECG   2. Murmur   3. Family history of early CAD   30. Mixed hyperlipidemia   5. Primary hypertension   6. Type 2 diabetes mellitus with hyperlipidemia (HCC)    PLAN:    In order of problems listed above:  #Family History of CAD:  #HLD: Patient with very strong family history of premature CAD. Chest pain atypical and does not sound cardiac in nature but given significant risk factors and family history will risk stratify with coronary calcium score. -Check coronary calcium  #Long QT interval: Qtc 484 likely related to medications. No personal history of significant palpitations or syncope. No family history of SCD. Will check TTE to ensure LVEF normal with no changes in medications at this time. -Check TTE  #Murmur: -Check TTE  #HTN: Better controlled.  -Continue metop 124m XL daily -Continue indapamide 1.263mdaily  #HLD: -Continue crestor 106maily; can adjust based on coronary calcium score  #DMII: A1C 6.1. Managed by PCP -Takes trucilicty  #Chronic Pain: -Followed by pain management    Medication Adjustments/Labs and Tests Ordered: Current medicines are reviewed at length with the patient today.  Concerns regarding medicines are outlined above.  Orders Placed This Encounter  Procedures  . CT CARDIAC SCORING (SELF PAY ONLY)  . EKG 12-Lead  . ECHOCARDIOGRAM COMPLETE   No orders of the defined types  were placed in this encounter.   Patient Instructions  Medication Instructions:  Your physician recommends  that you continue on your current medications as directed. Please refer to the Current Medication list given to you today.  *If you need a refill on your cardiac medications before your next appointment, please call your pharmacy*   Testing/Procedures: Your physician has requested that you have an echocardiogram. Echocardiography is a painless test that uses sound waves to create images of your heart. It provides your doctor with information about the size and shape of your heart and how well your heart's chambers and valves are working. This procedure takes approximately one hour. There are no restrictions for this procedure.  Your physician has requested that you have a calcium score CT scan.   Follow-Up: At [facility], you and your health needs are our priority.  As part of our continuing mission to provide you with exceptional heart care, we have created designated Provider Care Teams.  These Care Teams include your primary Cardiologist (physician) and Advanced Practice Providers (APPs -  Physician Assistants and Nurse Practitioners) who all work together to provide you with the care you need, when you need it.  Your next appointment:   8 month(s)  The format for your next appointment:   In Person  Provider:   You may see Freada Bergeron, MD or one of the following Advanced Practice Providers on your designated Care Team:    Melina Copa, PA-C  Ermalinda Barrios, PA-C       Signed, Freada Bergeron, MD  09/06/2020 1:02 PM    [place]

## 2020-09-05 ENCOUNTER — Telehealth: Payer: Self-pay | Admitting: Gastroenterology

## 2020-09-06 ENCOUNTER — Encounter: Payer: Self-pay | Admitting: Cardiology

## 2020-09-06 ENCOUNTER — Encounter: Payer: Self-pay | Admitting: Gastroenterology

## 2020-09-06 ENCOUNTER — Ambulatory Visit (INDEPENDENT_AMBULATORY_CARE_PROVIDER_SITE_OTHER): Payer: Medicare HMO | Admitting: Gastroenterology

## 2020-09-06 ENCOUNTER — Other Ambulatory Visit: Payer: Self-pay

## 2020-09-06 ENCOUNTER — Ambulatory Visit (INDEPENDENT_AMBULATORY_CARE_PROVIDER_SITE_OTHER): Payer: Medicare HMO | Admitting: Cardiology

## 2020-09-06 VITALS — BP 116/70 | HR 92 | Ht 67.5 in | Wt 227.6 lb

## 2020-09-06 VITALS — BP 130/90 | HR 90 | Ht 67.5 in | Wt 226.4 lb

## 2020-09-06 DIAGNOSIS — E1169 Type 2 diabetes mellitus with other specified complication: Secondary | ICD-10-CM

## 2020-09-06 DIAGNOSIS — K5909 Other constipation: Secondary | ICD-10-CM | POA: Diagnosis not present

## 2020-09-06 DIAGNOSIS — E785 Hyperlipidemia, unspecified: Secondary | ICD-10-CM | POA: Diagnosis not present

## 2020-09-06 DIAGNOSIS — E782 Mixed hyperlipidemia: Secondary | ICD-10-CM

## 2020-09-06 DIAGNOSIS — Z86010 Personal history of colonic polyps: Secondary | ICD-10-CM | POA: Diagnosis not present

## 2020-09-06 DIAGNOSIS — R011 Cardiac murmur, unspecified: Secondary | ICD-10-CM | POA: Diagnosis not present

## 2020-09-06 DIAGNOSIS — I10 Essential (primary) hypertension: Secondary | ICD-10-CM

## 2020-09-06 DIAGNOSIS — R9431 Abnormal electrocardiogram [ECG] [EKG]: Secondary | ICD-10-CM

## 2020-09-06 DIAGNOSIS — K645 Perianal venous thrombosis: Secondary | ICD-10-CM

## 2020-09-06 DIAGNOSIS — Z8249 Family history of ischemic heart disease and other diseases of the circulatory system: Secondary | ICD-10-CM

## 2020-09-06 MED ORDER — TRULANCE 3 MG PO TABS: 3.0000 mg | ORAL_TABLET | Freq: Every day | ORAL | 0 refills | Status: DC

## 2020-09-06 MED ORDER — POLYETHYLENE GLYCOL 3350 17 G PO PACK: 17.0000 g | PACK | Freq: Two times a day (BID) | ORAL | 0 refills | Status: DC

## 2020-09-06 MED ORDER — SUTAB 1479-225-188 MG PO TABS: 1.0000 | ORAL_TABLET | Freq: Once | ORAL | 0 refills | Status: AC

## 2020-09-06 MED ORDER — HYDROCORTISONE ACE-PRAMOXINE 1-1 % EX CREA: TOPICAL_CREAM | CUTANEOUS | 1 refills | Status: DC

## 2020-09-06 NOTE — Patient Instructions (Addendum)
If you are age 56 or older, your body mass index should be between 23-30. Your Body mass index is 35.12 kg/m. If this is out of the aforementioned range listed, please consider follow up with your Primary Care Provider.  If you are age 57 or younger, your body mass index should be between 19-25. Your Body mass index is 35.12 kg/m. If this is out of the aformentioned range listed, please consider follow up with your Primary Care Provider.   You have been scheduled for a colonoscopy. Please follow written instructions given to you at your visit today.  Please pick up your prep supplies at the pharmacy within the next 1-3 days. If you use inhalers (even only as needed), please bring them with you on the day of your procedure.  Increase Miralax to TWICE a day. ___________________________________________________________________  Please purchase the following medications over the counter and take as directed: 1% hydrocortisone cream - use 2 to 3 times a day rectally. Apply a pea-sized amount on the tip of your finger and insert to the first knuckle. __________________________________________________________________   We have given you samples of the following medication to take: Recticare: Use as directed for thrombosed hemorrhoid    We have given you samples of the following medication to take: Trulance 3 mg: Take once daily   How to Take a Sitz Bath A sitz bath is a warm water bath that may be used to care for your rectum, genital area, or the area between your rectum and genitals (perineum). In a sitz bath, the water only comes up to your hips and covers your buttocks. A sitz bath may be done in a bathtub or with a portable sitz bath that fits over the toilet. Your health care provider may recommend a sitz bath to help:  Relieve pain and discomfort after delivering a baby.  Relieve pain and itching from hemorrhoids or anal fissures.  Relieve pain after certain surgeries.  Relax  muscles that are sore or tight. How to take a sitz bath Take 3-4 sitz baths a day, or as many as told by your health care provider. Bathtub sitz bath To take a sitz bath in a bathtub: 1. Partially fill a bathtub with warm water. The water should be deep enough to cover your hips and buttocks when you are sitting in the tub. 2. Follow your health care provider's instructions if you are told to put medicine in the water. 3. Sit in the water. Open the tub drain a little, and leave it open during your bath. 4. Turn on the warm water again, enough to replace the water that is draining out. Keep the water running throughout your bath. This helps keep the water at the right level and temperature. 5. Soak in the water for 15-20 minutes, or as long as told by your health care provider. 6. When you are done, be careful when you stand up. You may feel dizzy. 7. After the sitz bath, pat yourself dry. Do not rub your skin to dry it.   Over-the-toilet sitz bath To take a sitz bath with an over-the-toilet basin: 1. Follow the manufacturer's instructions. 2. Fill the basin with warm water. 3. Follow your health care provider's instructions if you were told to put medicine in the water. 4. Sit on the seat. Make sure the water covers your buttocks and perineum. 5. Soak in the water for 15-20 minutes, or as long as told by your health care provider. 6. After the sitz bath,  pat yourself dry. Do not rub your skin to dry it. 7. Clean and dry the basin between uses. 8. Discard the basin if it cracks, or according to the manufacturer's instructions.   Contact a health care provider if:  Your pain or itching gets worse. Do not continue with sitz baths if your symptoms get worse.  You have new symptoms. Do not continue with sitz baths until you talk with your health care provider. Summary  A sitz bath is a warm water bath in which the water only comes up to your hips and covers your buttocks.  A sitz bath may  help relieve pain and discomfort after delivering a baby. It also may help with pain and itching from hemorrhoids or anal fissures, or pain after certain surgeries. It can also help to relax muscles that are sore or tight.  Take 3-4 sitz baths a day, or as many as told by your health care provider. Soak in the water for 15-20 minutes.  Do not continue with sitz baths if your symptoms get worse. This information is not intended to replace advice given to you by your health care provider. Make sure you discuss any questions you have with your health care provider. Document Revised: 02/11/2020 Document Reviewed: 02/11/2020 Elsevier Patient Education  2021 [place].   Thank you for entrusting me with your care and for choosing [employer], Dr. [place] Cellar

## 2020-09-06 NOTE — Patient Instructions (Addendum)
Medication Instructions:  Your physician recommends that you continue on your current medications as directed. Please refer to the Current Medication list given to you today.  *If you need a refill on your cardiac medications before your next appointment, please call your pharmacy*   Testing/Procedures: Your physician has requested that you have an echocardiogram. Echocardiography is a painless test that uses sound waves to create images of your heart. It provides your doctor with information about the size and shape of your heart and how well your heart's chambers and valves are working. This procedure takes approximately one hour. There are no restrictions for this procedure.  Your physician has requested that you have a calcium score CT scan.   Follow-Up: At [facility], you and your health needs are our priority.  As part of our continuing mission to provide you with exceptional heart care, we have created designated Provider Care Teams.  These Care Teams include your primary Cardiologist (physician) and Advanced Practice Providers (APPs -  Physician Assistants and Nurse Practitioners) who all work together to provide you with the care you need, when you need it.  Your next appointment:   8 month(s)  The format for your next appointment:   In Person  Provider:   You may see Freada Bergeron, MD or one of the following Advanced Practice Providers on your designated Care Team:    Melina Copa, PA-C  Ermalinda Barrios, PA-C

## 2020-09-06 NOTE — Telephone Encounter (Signed)
Patient not seen in person since 2019.  She will come in today at 3:40

## 2020-09-06 NOTE — Progress Notes (Signed)
HPI :  55 year old female here for follow-up visit for rectal discomfort, constipation, hemorrhoids.  She also has a history of colon polyps.  Recall that she has had severe constipation over time.  She takes chronic narcotics for back pain.  Previously was on Linzess for constipation which ultimately stopped working.  I gave her a trial of Motegrity and she states that worked really well for her for a long period time.  She states her insurance changed and is no longer covered.  She has not really been taking much of anything recently to help with this.  I think we gave her a trial of Amitiza when she called in initially for constipation it did not help as much.  She states constipation over the last 10 days has been worse.  On Saturday/Sunday timeframe she developed a pressure in her rectum and felt a protuberance that was quite tender.  She has had some discomfort since that time although it seems to be getting better over time.  She thinks it is a hemorrhoid that is bothering her.  She is used some topical RectiCare as needed which she states has helped but is still protruding.  She has had a bowel movement today, tries not to strain, has not noticed any blood in her stool.  She has not tried sitz bath's, she has a hard time getting into the bathtub on her own light of her chronic pain.  Colonoscopy 09/05/2017 -- Multiple medium-mouthed diverticula were found in the entire colon. 2 adenomas, one sessile serrated polyp, Internal hemorrhoids were found during retroflexion. Repeat in 3 years   Past Medical History:  Diagnosis Date  . Anemia   . Anxiety   . Asthma   . Carpal tunnel syndrome   . Chronic back pain   . Depression   . Essential hypertension   . History of diabetes mellitus   . History of multiple miscarriages   . Hyperlipidemia   . Torn rotator cuff      Past Surgical History:  Procedure Laterality Date  . BREAST SURGERY  2000   reduction  . CARPAL TUNNEL RELEASE      left  . CESAREAN SECTION    . COSMETIC SURGERY  2000   breast reduction  . REDUCTION MAMMAPLASTY Bilateral   . ROTATOR CUFF REPAIR  10/2019   Family History  Problem Relation Age of Onset  . Diabetes Mother   . Hypertension Mother   . Breast cancer Mother 17  . Hypertension Father   . Heart disease Father   . Early death Brother   . Kidney disease Maternal Grandmother   . Heart disease Brother   . Colon cancer Neg Hx   . Rectal cancer Neg Hx   . Esophageal cancer Neg Hx   . Pancreatic cancer Neg Hx   . Liver disease Neg Hx   . Stomach cancer Neg Hx    Social History   Tobacco Use  . Smoking status: Current Some Day Smoker    Packs/day: 0.25    Types: Cigarettes  . Smokeless tobacco: Never Used  Vaping Use  . Vaping Use: Never used  Substance Use Topics  . Alcohol use: No  . Drug use: No   Current Outpatient Medications  Medication Sig Dispense Refill  . Biotin 5000 MCG CAPS Take 1 capsule by mouth.    . Blood Glucose Monitoring Suppl (BLOOD GLUCOSE SYSTEM PAK) KIT Please dispense based on patient and insurance preference. Use as directed to monitor  FSBS 2x daily. Dx: E11.9 1 kit 1  . Cholecalciferol (VITAMIN D3) 25 MCG (1000 UT) CAPS Take by mouth 2 (two) times daily.    . cloNIDine (CATAPRES) 0.2 MG tablet Take 1 tablet (0.2 mg total) by mouth 2 (two) times daily. 180 tablet 3  . Cyanocobalamin (VITAMIN B 12 PO) Take 1 tablet by mouth daily.     . Dulaglutide (TRULICITY) 0.94 BS/9.6GE SOPN Inject 0.75 mg into the skin once a week. 4 mL 11  . ELDERBERRY PO Take by mouth.    . gabapentin (NEURONTIN) 300 MG capsule Take 600 mg by mouth 3 (three) times daily.    . Glucose Blood (BLOOD GLUCOSE TEST STRIPS) STRP Please dispense based on patient and insurance preference. Use as directed to monitor FSBS 2x daily. Dx: E11.9 200 strip 1  . hydrocortisone 2.5 % ointment Apply topically 2 (two) times daily. To affected areas on face 60 g 2  . hydrOXYzine (ATARAX/VISTARIL) 50 MG  tablet Take 1 tablet (50 mg total) by mouth 3 (three) times daily as needed for anxiety. 90 tablet 2  . indapamide (LOZOL) 1.25 MG tablet Take 1 tablet (1.25 mg total) by mouth daily. 90 tablet 0  . Lancets MISC Please dispense based on patient and insurance preference. Use as directed to monitor FSBS 2x daily. Dx: E11.9 200 each 1  . lithium carbonate (LITHOBID) 300 MG CR tablet Take 2 tablets (600 mg total) by mouth 2 (two) times daily. 120 tablet 2  . meloxicam (MOBIC) 7.5 MG tablet Take 7.5 mg by mouth 2 (two) times daily.  5  . metoprolol succinate (TOPROL-XL) 100 MG 24 hr tablet TAKE 1 TABLET (100 MG TOTAL) BY MOUTH DAILY. TAKE WITH OR IMMEDIATELY FOLLOWING A MEAL. 90 tablet 3  . mirtazapine (REMERON) 45 MG tablet Take 1 tablet (45 mg total) by mouth at bedtime. 30 tablet 2  . MOTEGRITY 2 MG TABS TAKE 1 TABLET BY MOUTH DAILY. 30 tablet 5  . oxyCODONE-acetaminophen (PERCOCET/ROXICET) 5-325 MG tablet Take by mouth 3 (three) times daily.    . rosuvastatin (CRESTOR) 10 MG tablet TAKE 1 TABLET (10 MG TOTAL) BY MOUTH DAILY. 90 tablet 1  . Turmeric (QC TUMERIC COMPLEX PO) Take by mouth.    . vitamin C (ASCORBIC ACID) 500 MG tablet Take 500 mg by mouth daily.     No current facility-administered medications for this visit.   Allergies  Allergen Reactions  . Trazodone And Nefazodone Hives  . Tape Rash     Review of Systems: All systems reviewed and negative except where noted in HPI.    MM 3D SCREEN BREAST BILATERAL  Result Date: 08/20/2020 CLINICAL DATA:  Screening. EXAM: DIGITAL SCREENING BILATERAL MAMMOGRAM WITH TOMOSYNTHESIS AND CAD TECHNIQUE: Bilateral screening digital craniocaudal and mediolateral oblique mammograms were obtained. Bilateral screening digital breast tomosynthesis was performed. The images were evaluated with computer-aided detection. COMPARISON:  Previous exam(s). ACR Breast Density Category b: There are scattered areas of fibroglandular density. FINDINGS: There are no  findings suspicious for malignancy. The images were evaluated with computer-aided detection. IMPRESSION: No mammographic evidence of malignancy. A result letter of this screening mammogram will be mailed directly to the patient. RECOMMENDATION: Screening mammogram in one year. (Code:SM-B-01Y) BI-RADS CATEGORY  1: Negative. Electronically Signed   By: Franki Cabot M.D.   On: 08/20/2020 10:49    Lab Results  Component Value Date   WBC 6.8 08/17/2020   HGB 14.3 08/17/2020   HCT 43.9 08/17/2020   MCV 83.1  08/17/2020   PLT 206.0 08/17/2020    Lab Results  Component Value Date   CREATININE 0.87 08/17/2020   BUN 20 08/17/2020   NA 142 08/17/2020   K 3.8 08/17/2020   CL 105 08/17/2020   CO2 30 08/17/2020   Lab Results  Component Value Date   ALT 14 05/16/2020   AST 11 05/16/2020   ALKPHOS 74 08/01/2017   BILITOT 0.3 05/16/2020     Physical Exam: BP 116/70   Pulse 92   Ht 5' 7.5" (1.715 m)   Wt 227 lb 9.6 oz (103.2 kg)   LMP 12/17/2017   SpO2 99%   BMI 35.12 kg/m  Constitutional: Pleasant,well-developed, female in no acute distress. Abdominal: Soft, nondistended, nontender. . There are no masses palpable.  DRE - [place] standby - thrombosed external hemorrhoid, internal exam not done due to pain, no obvious fissure Extremities: no edema Neurological: Alert and oriented to person place and time. Skin: Skin is warm and dry. No rashes noted. Psychiatric: Normal mood and affect. Behavior is normal.   ASSESSMENT AND PLAN: 55 year old female here for reassessment of the following:  Thrombosed hemorrhoid Chronic constipation History of colon polyps  Worsening constipation recently, she has a thrombosed hemorrhoid on exam today which is the likely cause for her symptoms.  She states it is definitely better than when it first started.  She is about 72 hours out from onset, at this point the day she will not be able to get a surgical evaluation, I think at this point time  conservative measures will likely heal this on its own with more time.  She needs to have her constipation better managed.  Recommend she take MiraLAX a few times a day and then can add a sample of Trulance on top of that to see if it helps.  If she fails Trulance will see if her insurance will approve Motegrity again which worked quite well for her.  Could also consider Relistor or Movantik if no improvement.  For her thrombosed hemorrhoid recommend sitz bath's, topical 1% hydrocortisone (given Anusol too expensive), provided her more samples of RectiCare which has helped take the pain away.  I anticipate this will get better over the next several days but if it does not or worsen she needs to contact us.  Otherwise she is due for surveillance colonoscopy.  I discussed risk benefits of colonoscopy and anesthesia with her and she wants to proceed.  The further recommendations pending the results.  Plan: - Sitz baths - topical 1% hydrocortisone, pea sized amount PR BID - topical recticare - Miralax BID - add Trulance samples PRN - surveillance colonoscopy, to be scheduled in a few months  [place] Cellar, MD [facility]

## 2020-09-08 ENCOUNTER — Other Ambulatory Visit: Payer: Self-pay

## 2020-09-08 ENCOUNTER — Telehealth (HOSPITAL_BASED_OUTPATIENT_CLINIC_OR_DEPARTMENT_OTHER): Payer: Medicare HMO | Admitting: Psychiatry

## 2020-09-08 DIAGNOSIS — F4310 Post-traumatic stress disorder, unspecified: Secondary | ICD-10-CM

## 2020-09-08 DIAGNOSIS — G4701 Insomnia due to medical condition: Secondary | ICD-10-CM | POA: Diagnosis not present

## 2020-09-08 DIAGNOSIS — F411 Generalized anxiety disorder: Secondary | ICD-10-CM

## 2020-09-08 DIAGNOSIS — F333 Major depressive disorder, recurrent, severe with psychotic symptoms: Secondary | ICD-10-CM | POA: Diagnosis not present

## 2020-09-08 MED ORDER — LORAZEPAM 0.5 MG PO TABS: 0.5000 mg | ORAL_TABLET | Freq: Two times a day (BID) | ORAL | 1 refills | Status: DC

## 2020-09-08 MED ORDER — LITHIUM CARBONATE ER 300 MG PO TBCR: 600.0000 mg | EXTENDED_RELEASE_TABLET | Freq: Two times a day (BID) | ORAL | 1 refills | Status: DC

## 2020-09-08 MED ORDER — MIRTAZAPINE 45 MG PO TABS: 45.0000 mg | ORAL_TABLET | Freq: Every day | ORAL | 1 refills | Status: DC

## 2020-09-08 NOTE — Progress Notes (Signed)
Virtual Visit via Telephone Note  I connected with Melody Jenkins on 09/08/20 at 10:00 AM EDT by telephone and verified that I am speaking with the correct person using two identifiers.  Location: Patient: home Provider: office   I discussed the limitations, risks, security and privacy concerns of performing an evaluation and management service by telephone and the availability of in person appointments. I also discussed with the patient that there may be a patient responsible charge related to this service. The patient expressed understanding and agreed to proceed.   History of Present Illness: "I'm doing ok mentally but not physically". She is suffering from hemorrhoids and is working with her PCP. She is going to a cardiologist. She is going to an ECHO. Numerous family members have died due to heart problems. Melody Jenkins has not had a heart attack but does have a mummer. Melody Jenkins is very anxious. She is scared about her health. She is going to get a life line and has an appointment next week.  She has reached out to her therapist to restart therapy. Shauna got together with her family this past weekend it was a really good get together. She is trying to mediate and deep breathing to help relax her anxiety. She is taking the Vistaril and it helps. Her depression was getting a little better until she got the news about her health. She is trying to stay strong and not to let her depression get worse. Even when she starts to get down she will distract herself. Her sleep is poor and it takes her hours to fall asleep due to racing thoughts. [name] is very lonely. Her appetite is poor. She denies SI/HI.     Observations/Objective:  General Appearance: unable to assess  Eye Contact:  unable to assess  Speech:  Clear and Coherent and Normal Rate  Volume:  Normal  Mood:  Anxious and Depressed  Affect:  Congruent  Thought Process:  Coherent and Descriptions of Associations: Circumstantial   Orientation:  Full (Time, Place, and Person)  Thought Content:  Logical  Suicidal Thoughts:  No  Homicidal Thoughts:  No  Memory:  Immediate;   Good  Judgement:  Good  Insight:  Good  Psychomotor Activity: unable to assess  Concentration:  Concentration: Good  Recall:  Good  Fund of Knowledge:  Good  Language:  Good  Akathisia:  unable to assess  Handed:  Right  AIMS (if indicated):     Assets:  Communication Skills Desire for Improvement Financial Resources/Insurance Housing Resilience Social Support Talents/Skills Transportation Vocational/Educational  ADL's:  unable to assess  Cognition:  WNL  Sleep:         Assessment and Plan: Depression screen [facility] 2/9 09/08/2020 08/17/2020 01/13/2020 01/13/2020 08/25/2019  Decreased Interest 1 0 0 0 1  Down, Depressed, Hopeless 1 - 1 0 0  PHQ - 2 Score 2 0 1 0 1  Altered sleeping 3 0 2 - -  Tired, decreased energy 3 0 2 - -  Change in appetite 3 0 0 - -  Feeling bad or failure about yourself  3 0 0 - -  Trouble concentrating 3 0 2 - -  Moving slowly or fidgety/restless 0 0 0 - -  Suicidal thoughts 0 0 0 - -  PHQ-9 Score 17 0 7 - -  Difficult doing work/chores Very difficult - Not difficult at all - -  Some recent data might be hidden    Flowsheet Row [facility]  C-SSRS RISK CATEGORY No Risk       D/c Vistaril due to prolonged QTc Start low dose Ativan prn for anxiety. We reviewed risks and SE. Mychael verbalized understanding.   1. GAD (generalized anxiety disorder) - mirtazapine (REMERON) 45 MG tablet; Take 1 tablet (45 mg total) by mouth at bedtime.  Dispense: 30 tablet; Refill: 1  2. MDD (major depressive disorder), recurrent, severe, with psychosis ([place]) - lithium carbonate (LITHOBID) 300 MG CR tablet; Take 2 tablets (600 mg total) by mouth 2 (two) times daily.  Dispense: 120 tablet; Refill: 1 - mirtazapine (REMERON) 45 MG tablet; Take 1 tablet (45  mg total) by mouth at bedtime.  Dispense: 30 tablet; Refill: 1  3. Insomnia due to medical condition - mirtazapine (REMERON) 45 MG tablet; Take 1 tablet (45 mg total) by mouth at bedtime.  Dispense: 30 tablet; Refill: 1  4. PTSD (post-traumatic stress disorder) - mirtazapine (REMERON) 45 MG tablet; Take 1 tablet (45 mg total) by mouth at bedtime.  Dispense: 30 tablet; Refill: 1  - [name] has restarted therapy and is using coping skills  Follow Up Instructions: In 2-3 months or sooner if needed   I discussed the assessment and treatment plan with the patient. The patient was provided an opportunity to ask questions and all were answered. The patient agreed with the plan and demonstrated an understanding of the instructions.   The patient was advised to call back or seek an in-person evaluation if the symptoms worsen or if the condition fails to improve as anticipated.  I provided 31 minutes of non-face-to-face time during this encounter.   Charlcie Cradle, MD

## 2020-09-29 DIAGNOSIS — M47816 Spondylosis without myelopathy or radiculopathy, lumbar region: Secondary | ICD-10-CM | POA: Diagnosis not present

## 2020-09-29 DIAGNOSIS — F329 Major depressive disorder, single episode, unspecified: Secondary | ICD-10-CM | POA: Diagnosis not present

## 2020-09-29 DIAGNOSIS — M5137 Other intervertebral disc degeneration, lumbosacral region: Secondary | ICD-10-CM | POA: Diagnosis not present

## 2020-09-29 DIAGNOSIS — Z79899 Other long term (current) drug therapy: Secondary | ICD-10-CM | POA: Diagnosis not present

## 2020-10-07 ENCOUNTER — Ambulatory Visit (HOSPITAL_COMMUNITY): Payer: Medicare HMO | Attending: Cardiovascular Disease

## 2020-10-07 ENCOUNTER — Encounter (HOSPITAL_COMMUNITY): Payer: Self-pay

## 2020-10-07 ENCOUNTER — Ambulatory Visit (INDEPENDENT_AMBULATORY_CARE_PROVIDER_SITE_OTHER)
Admission: RE | Admit: 2020-10-07 | Discharge: 2020-10-07 | Disposition: A | Discharge status: Home / Self Care | Payer: Self-pay | Source: Ambulatory Visit | Attending: Cardiology | Admitting: Cardiology

## 2020-10-07 ENCOUNTER — Other Ambulatory Visit: Payer: Self-pay

## 2020-10-07 DIAGNOSIS — R9431 Abnormal electrocardiogram [ECG] [EKG]: Secondary | ICD-10-CM

## 2020-10-07 DIAGNOSIS — R011 Cardiac murmur, unspecified: Secondary | ICD-10-CM

## 2020-10-07 LAB — ECHOCARDIOGRAM COMPLETE
Area-P 1/2: 3.65 cm2
S' Lateral: 3.3 cm

## 2020-10-07 NOTE — Progress Notes (Signed)
Verified appointment "no show" status with AE[facility] at 09:29.

## 2020-10-26 ENCOUNTER — Encounter: Payer: Self-pay | Admitting: Internal Medicine

## 2020-10-27 ENCOUNTER — Telehealth (INDEPENDENT_AMBULATORY_CARE_PROVIDER_SITE_OTHER): Payer: Medicare HMO | Admitting: Psychiatry

## 2020-10-27 ENCOUNTER — Other Ambulatory Visit: Payer: Self-pay

## 2020-10-27 DIAGNOSIS — F411 Generalized anxiety disorder: Secondary | ICD-10-CM

## 2020-10-27 DIAGNOSIS — G4701 Insomnia due to medical condition: Secondary | ICD-10-CM | POA: Diagnosis not present

## 2020-10-27 DIAGNOSIS — F333 Major depressive disorder, recurrent, severe with psychotic symptoms: Secondary | ICD-10-CM | POA: Diagnosis not present

## 2020-10-27 DIAGNOSIS — F4310 Post-traumatic stress disorder, unspecified: Secondary | ICD-10-CM | POA: Diagnosis not present

## 2020-10-27 MED ORDER — LITHIUM CARBONATE ER 300 MG PO TBCR
600.0000 mg | EXTENDED_RELEASE_TABLET | Freq: Two times a day (BID) | ORAL | 2 refills | Status: DC
Start: 2020-10-27 — End: 2021-01-05

## 2020-10-27 MED ORDER — LORAZEPAM 0.5 MG PO TABS: 0.5000 mg | ORAL_TABLET | Freq: Two times a day (BID) | ORAL | 2 refills | Status: DC

## 2020-10-27 MED ORDER — MIRTAZAPINE 45 MG PO TABS: 45.0000 mg | ORAL_TABLET | Freq: Every day | ORAL | 2 refills | Status: DC

## 2020-10-27 NOTE — Telephone Encounter (Signed)
I have had a verbal conversation with PCP.  He recommends that she go to the ED  I have reached out the pt and informed her of PCP decision. She agreed to go for an evaluation at the ED.

## 2020-10-27 NOTE — Progress Notes (Signed)
Virtual Visit via Telephone Note  I connected with Melody Jenkins on 10/27/20 at 10:00 AM EDT by telephone and verified that I am speaking with the correct person using two identifiers.  Location: Patient: home Provider: office   I discussed the limitations, risks, security and privacy concerns of performing an evaluation and management service by telephone and the availability of in person appointments. I also discussed with the patient that there may be a patient responsible charge related to this service. The patient expressed understanding and agreed to proceed.   History of Present Illness: Melody Jenkins went to her cardiologist and the tests results were good. She has a mummer but her doctor told her it was not something to worry about. [name]'s whole family has a history of heart attacks. She is the only one without cardiac issues. It relieved a lot of her anxiety and stress regarding multiple areas of her life.  This allowed her decrease her depression. Her mom is doing a lot better. [name] is concerned about her daughter adopting a child. She is trying to talk to her daughter about how it will go. Now [name] is focusing on her. She is taking things day by day. She is letting people know that she can not handle much of other's people of stress. She is willing to help to a certain extent but it is not going to be as much as before. In July she is going to court regarding the complaint against 2 lawyers and not having a jury trial. [name] is having some on/off pain in her right arm. She called her doctor and they recommended she go to the ED to rule out a stroke. She thinks the pain is due to her carpel tunnel surgery. She is going to the ED later today. Her PTSD is ongoing. She went to the grave site 3 weeks ago. She was having a lot of nightmares likely due to stress. She is still not sleeping well. She is taking her meds but it is taking a long while to fall asleep.  [name] did a month of  counseling with her old therapist last month. She wants to continue but since the therapist is out of network she can only go as needed. Her anxiety is down. [name] notes that her frustration tolerance is low.  Her depression is better. It is manageable. She is not crying or feeling hopeless. Her appetite is poor and she has to force herself to eat. She denies SI/HI.    Observations/Objective:  General Appearance: unable to assess  Eye Contact:  unable to assess  Speech:  Clear and Coherent and Normal Rate  Volume:  Normal  Mood:  Anxious and Depressed  Affect:  Congruent  Thought Process:  Coherent and Descriptions of Associations: Circumstantial  Orientation:  Full (Time, Place, and Person)  Thought Content:  Rumination  Suicidal Thoughts:  No  Homicidal Thoughts:  No  Memory:  Immediate;   Good  Judgement:  Good  Insight:  Good  Psychomotor Activity: unable to assess  Concentration:  Concentration: Good  Recall:  Good  Fund of Knowledge:  Good  Language:  Good  Akathisia:  unable to assess  Handed:  Right  AIMS (if indicated):     Assets:  Communication Skills Desire for Improvement Financial Resources/Insurance Housing Resilience Social Support Talents/Skills Transportation Vocational/Educational  ADL's:  unable to assess  Cognition:  WNL  Sleep:         Assessment and Plan: Depression screen [facility] 2/9  10/27/2020 09/08/2020 08/17/2020 01/13/2020 01/13/2020  Decreased Interest 2 1 0 0 0  Down, Depressed, Hopeless 1 1 - 1 0  PHQ - 2 Score 3 2 0 1 0  Altered sleeping 3 3 0 2 -  Tired, decreased energy 2 3 0 2 -  Change in appetite 3 3 0 0 -  Feeling bad or failure about yourself  3 3 0 0 -  Trouble concentrating 2 3 0 2 -  Moving slowly or fidgety/restless 0 0 0 0 -  Suicidal thoughts 0 0 0 0 -  PHQ-9 Score 16 17 0 7 -  Difficult doing work/chores Very difficult Very difficult - Not difficult at all -  Some recent data might be hidden    Flowsheet Row Video Visit  from 10/27/2020 in [facility] Video Visit from 09/08/2020 in [facility]  C-SSRS RISK CATEGORY No Risk No Risk      1. MDD (major depressive disorder), recurrent, severe, with psychosis ([place]) - lithium carbonate (LITHOBID) 300 MG CR tablet; Take 2 tablets (600 mg total) by mouth 2 (two) times daily.  Dispense: 120 tablet; Refill: 2 - mirtazapine (REMERON) 45 MG tablet; Take 1 tablet (45 mg total) by mouth at bedtime.  Dispense: 30 tablet; Refill: 2  2. GAD (generalized anxiety disorder) - mirtazapine (REMERON) 45 MG tablet; Take 1 tablet (45 mg total) by mouth at bedtime.  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  Dispense: 30 tablet; Refill: 2  4. PTSD (post-traumatic stress disorder) - mirtazapine (REMERON) 45 MG tablet; Take 1 tablet (45 mg total) by mouth at bedtime.  Dispense: 30 tablet; Refill: 2   - she is going for counseling prn - she is working with her cardiologist who is aware of Leala current meds and did not recommend any changes  Follow Up Instructions: In 2-3 months or sooner if needed   I discussed the assessment and treatment plan with the patient. The patient was provided an opportunity to ask questions and all were answered. The patient agreed with the plan and demonstrated an understanding of the instructions.   The patient was advised to call back or seek an in-person evaluation if the symptoms worsen or if the condition fails to improve as anticipated.  I provided 28 minutes of non-face-to-face time during this encounter.   Charlcie Cradle, MD

## 2020-11-16 ENCOUNTER — Encounter: Payer: Self-pay | Admitting: Gastroenterology

## 2020-11-17 ENCOUNTER — Ambulatory Visit: Payer: Medicare HMO | Admitting: Internal Medicine

## 2020-11-17 ENCOUNTER — Other Ambulatory Visit: Payer: Self-pay | Admitting: Internal Medicine

## 2020-11-17 ENCOUNTER — Telehealth: Payer: Self-pay | Admitting: Internal Medicine

## 2020-11-17 DIAGNOSIS — E785 Hyperlipidemia, unspecified: Secondary | ICD-10-CM

## 2020-11-17 MED ORDER — ROSUVASTATIN CALCIUM 10 MG PO TABS: 10.0000 mg | ORAL_TABLET | Freq: Every day | ORAL | 1 refills | Status: DC

## 2020-11-17 NOTE — Telephone Encounter (Signed)
1.Medication Requested: rosuvastatin (CRESTOR) 10 MG tablet   2. Pharmacy (Name, [place], [facility]): [facility] - [place], [place]  3. On Med List: yes   4. Last Visit with PCP: 08-17-20  5. Next visit date with PCP: 12-27-20   Agent: Please be advised that RX refills may take up to 3 business days. We ask that you follow-up with your pharmacy.

## 2020-11-18 ENCOUNTER — Encounter: Payer: Self-pay | Admitting: Gastroenterology

## 2020-11-18 ENCOUNTER — Ambulatory Visit (AMBULATORY_SURGERY_CENTER): Payer: Medicare HMO | Admitting: Gastroenterology

## 2020-11-18 ENCOUNTER — Other Ambulatory Visit: Payer: Self-pay

## 2020-11-18 VITALS — BP 134/97 | HR 84 | Temp 97.3°F | Resp 22 | Ht 67.5 in | Wt 227.0 lb

## 2020-11-18 DIAGNOSIS — Z86010 Personal history of colonic polyps: Secondary | ICD-10-CM | POA: Diagnosis not present

## 2020-11-18 DIAGNOSIS — Z1211 Encounter for screening for malignant neoplasm of colon: Secondary | ICD-10-CM | POA: Diagnosis not present

## 2020-11-18 DIAGNOSIS — K635 Polyp of colon: Secondary | ICD-10-CM | POA: Diagnosis not present

## 2020-11-18 DIAGNOSIS — D123 Benign neoplasm of transverse colon: Secondary | ICD-10-CM

## 2020-11-18 MED ORDER — SODIUM CHLORIDE 0.9 % IV SOLN: 500.0000 mL | Freq: Once | INTRAVENOUS | Status: DC

## 2020-11-18 NOTE — Progress Notes (Signed)
Called to room to assist during endoscopic procedure.  Patient ID and intended procedure confirmed with present staff. Received instructions for my participation in the procedure from the performing physician.  

## 2020-11-18 NOTE — Progress Notes (Signed)
A and O x3. Report to RN. Tolerated MAC anesthesia well. 

## 2020-11-18 NOTE — Patient Instructions (Signed)
Please read handouts provided. Continue present medications. Await pathology results.   YOU HAD AN ENDOSCOPIC PROCEDURE TODAY AT THE [facility]:   Refer to the procedure report that was given to you for any specific questions about what was found during the examination.  If the procedure report does not answer your questions, please call your gastroenterologist to clarify.  If you requested that your care partner not be given the details of your procedure findings, then the procedure report has been included in a sealed envelope for you to review at your convenience later.  YOU SHOULD EXPECT: Some feelings of bloating in the abdomen. Passage of more gas than usual.  Walking can help get rid of the air that was put into your GI tract during the procedure and reduce the bloating. If you had a lower endoscopy (such as a colonoscopy or flexible sigmoidoscopy) you may notice spotting of blood in your stool or on the toilet paper. If you underwent a bowel prep for your procedure, you may not have a normal bowel movement for a few days.  Please Note:  You might notice some irritation and congestion in your nose or some drainage.  This is from the oxygen used during your procedure.  There is no need for concern and it should clear up in a day or so.  SYMPTOMS TO REPORT IMMEDIATELY:  Following lower endoscopy (colonoscopy or flexible sigmoidoscopy):  Excessive amounts of blood in the stool  Significant tenderness or worsening of abdominal pains  Swelling of the abdomen that is new, acute  Fever of 100F or higher   For urgent or emergent issues, a gastroenterologist can be reached at any hour by calling [phone]. Do not use MyChart messaging for urgent concerns.    DIET:  We do recommend a small meal at first, but then you may proceed to your regular diet.  Drink plenty of fluids but you should avoid alcoholic beverages for 24 hours.  ACTIVITY:  You should plan to take it easy  for the rest of today and you should NOT DRIVE or use heavy machinery until tomorrow (because of the sedation medicines used during the test).    FOLLOW UP: Our staff will call the number listed on your records 48-72 hours following your procedure to check on you and address any questions or concerns that you may have regarding the information given to you following your procedure. If we do not reach you, we will leave a message.  We will attempt to reach you two times.  During this call, we will ask if you have developed any symptoms of COVID 19. If you develop any symptoms (ie: fever, flu-like symptoms, shortness of breath, cough etc.) before then, please call [phone].  If you test positive for Covid 19 in the 2 weeks post procedure, please call and report this information to us.    If any biopsies were taken you will be contacted by phone or by letter within the next 1-3 weeks.  Please call us at [phone] if you have not heard about the biopsies in 3 weeks.    SIGNATURES/CONFIDENTIALITY: You and/or your care partner have signed paperwork which will be entered into your electronic medical record.  These signatures attest to the fact that that the information above on your After Visit Summary has been reviewed and is understood.  Full responsibility of the confidentiality of this discharge information lies with you and/or your care-partner.  

## 2020-11-18 NOTE — Op Note (Signed)
[facility] Patient Name: Runell Kovich Procedure Date: 11/18/2020 8:49 AM MRN: [id number] Endoscopist: Remo Lipps P. 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 The patient's tolerance of                            previous anesthesia was also reviewed. The risks                            and benefits of the procedure and the sedation                            options and risks were discussed with the patient.                            All questions were answered, and informed consent                            was obtained. Prior Anticoagulants: The patient has                            taken no previous anticoagulant or antiplatelet                            agents. ASA Grade Assessment: III - A patient with                            severe systemic disease. After reviewing the risks                            and benefits, the patient was deemed in  satisfactory condition to undergo the procedure.                           After obtaining informed consent, the colonoscope                            was passed under direct vision. Throughout the                            procedure, the patient's blood pressure, pulse, and                            oxygen saturations were monitored continuously. The                            Colonoscope was introduced through  the anus and                            advanced to the the cecum, identified by                            appendiceal orifice and ileocecal valve. The                            colonoscopy was performed without difficulty. The                            patient tolerated the procedure well. The quality                            of the bowel preparation was adequate. The                            ileocecal valve, appendiceal orifice, and rectum                            were photographed. Scope In: 8:55:53 AM Scope Out: 9:14:07 AM Scope Withdrawal Time: 0 hours 14 minutes 17 seconds  Total Procedure Duration: 0 hours 18 minutes 14 seconds  Findings:                 The perianal and digital rectal examinations were                            normal.                           Multiple medium-mouthed diverticula were found in                            the entire colon.                           A 4 mm polyp was found in the transverse colon. The  polyp was flat. The polyp was removed with a cold                            snare. Resection and retrieval were complete.                           Internal hemorrhoids were found during retroflexion.                           The exam was otherwise without abnormality. Complications:            No immediate complications. Estimated blood loss:                            Minimal. Estimated Blood Loss:     Estimated blood loss was minimal. Impression:               - Diverticulosis in the entire examined colon.                           - One 4 mm polyp in the transverse colon, removed                            with a cold snare. Resected and retrieved.                           - Internal hemorrhoids.                           - The examination was otherwise normal. Recommendation:           - Patient has a contact number available for                            emergencies. The signs and symptoms of potential                             delayed complications were discussed with the                            patient. Return to normal activities tomorrow.                            Written discharge instructions were provided to the                            patient.                           - Resume previous diet.                           - Continue present medications.                           - Await pathology results. Remo Lipps P. Juliza Machnik, MD 11/18/2020 9:18:49 AM This report has  been signed electronically.

## 2020-11-22 ENCOUNTER — Telehealth: Payer: Self-pay

## 2020-11-22 ENCOUNTER — Telehealth: Payer: Self-pay | Admitting: *Deleted

## 2020-11-22 NOTE — Telephone Encounter (Signed)
  Follow up Call-  Call back number 11/18/2020  Post procedure Call Back phone  # [phone]  Permission to leave phone message Yes  Some recent data might be hidden     Patient questions:  Do you have a fever, pain , or abdominal swelling? No. Pain Score  0 *  Have you tolerated food without any problems? Yes.    Have you been able to return to your normal activities? Yes.    Do you have any questions about your discharge instructions: Diet   No. Medications  No. Follow up visit  No.  Do you have questions or concerns about your Care? No.  Actions: * If pain score is 4 or above: No action needed, pain <4.  Have you developed a fever since your procedure? no  2.   Have you had an respiratory symptoms (SOB or cough) since your procedure? no  3.   Have you tested positive for COVID 19 since your procedure no  4.   Have you had any family members/close contacts diagnosed with the COVID 19 since your procedure?  no   If yes to any of these questions please route to Joylene John, RN and Joella Prince, RN

## 2020-11-22 NOTE — Telephone Encounter (Signed)
Message left

## 2020-11-30 LAB — HM COLONOSCOPY

## 2020-12-26 DIAGNOSIS — M5137 Other intervertebral disc degeneration, lumbosacral region: Secondary | ICD-10-CM | POA: Diagnosis not present

## 2020-12-26 DIAGNOSIS — F329 Major depressive disorder, single episode, unspecified: Secondary | ICD-10-CM | POA: Diagnosis not present

## 2020-12-26 DIAGNOSIS — F1120 Opioid dependence, uncomplicated: Secondary | ICD-10-CM | POA: Diagnosis not present

## 2020-12-26 DIAGNOSIS — F419 Anxiety disorder, unspecified: Secondary | ICD-10-CM | POA: Diagnosis not present

## 2020-12-27 ENCOUNTER — Ambulatory Visit (INDEPENDENT_AMBULATORY_CARE_PROVIDER_SITE_OTHER): Payer: Medicare HMO | Admitting: Internal Medicine

## 2020-12-27 ENCOUNTER — Other Ambulatory Visit: Payer: Self-pay

## 2020-12-27 ENCOUNTER — Encounter: Payer: Self-pay | Admitting: Internal Medicine

## 2020-12-27 VITALS — BP 126/84 | HR 88 | Temp 98.0°F | Resp 16 | Ht 67.5 in | Wt 228.0 lb

## 2020-12-27 DIAGNOSIS — E876 Hypokalemia: Secondary | ICD-10-CM

## 2020-12-27 DIAGNOSIS — T63481A Toxic effect of venom of other arthropod, accidental (unintentional), initial encounter: Secondary | ICD-10-CM | POA: Diagnosis not present

## 2020-12-27 DIAGNOSIS — T502X5A Adverse effect of carbonic-anhydrase inhibitors, benzothiadiazides and other diuretics, initial encounter: Secondary | ICD-10-CM | POA: Diagnosis not present

## 2020-12-27 DIAGNOSIS — Z23 Encounter for immunization: Secondary | ICD-10-CM

## 2020-12-27 DIAGNOSIS — E785 Hyperlipidemia, unspecified: Secondary | ICD-10-CM | POA: Diagnosis not present

## 2020-12-27 DIAGNOSIS — I10 Essential (primary) hypertension: Secondary | ICD-10-CM | POA: Diagnosis not present

## 2020-12-27 DIAGNOSIS — E1169 Type 2 diabetes mellitus with other specified complication: Secondary | ICD-10-CM | POA: Diagnosis not present

## 2020-12-27 LAB — BASIC METABOLIC PANEL
BUN: 20 mg/dL (ref 6–23)
CO2: 33 mEq/L — ABNORMAL HIGH (ref 19–32)
Calcium: 9.7 mg/dL (ref 8.4–10.5)
Chloride: 100 mEq/L (ref 96–112)
Creatinine, Ser: 0.94 mg/dL (ref 0.40–1.20)
GFR: 68.13 mL/min (ref >60.00)
Glucose, Bld: 114 mg/dL — ABNORMAL HIGH (ref 70–99)
Potassium: 3.3 mEq/L — ABNORMAL LOW (ref 3.5–5.1)
Sodium: 140 mEq/L (ref 135–145)

## 2020-12-27 LAB — HEMOGLOBIN A1C: Hgb A1c MFr Bld: 6.1 % (ref 4.6–6.5)

## 2020-12-27 MED ORDER — METHYLPREDNISOLONE ACETATE 80 MG/ML IJ SUSP
80.0000 mg | Freq: Once | INTRAMUSCULAR | Status: AC
  Administered 2020-12-27: 80 mg via INTRAMUSCULAR

## 2020-12-27 MED ORDER — HYDROCORTISONE 2.5 % EX OINT: TOPICAL_OINTMENT | Freq: Two times a day (BID) | CUTANEOUS | 2 refills | Status: AC

## 2020-12-27 MED ORDER — POTASSIUM CHLORIDE ER 10 MEQ PO TBCR: 10.0000 meq | EXTENDED_RELEASE_TABLET | Freq: Three times a day (TID) | ORAL | 0 refills | Status: DC

## 2020-12-27 MED ORDER — SHINGRIX 50 MCG/0.5ML IM SUSR: 0.5000 mL | Freq: Once | INTRAMUSCULAR | 1 refills | Status: AC

## 2020-12-27 NOTE — Patient Instructions (Signed)
Insect Bite, Adult An insect bite can make your skin red, itchy, and swollen. An insect bite is different from an insect sting, which happens when an insect injects poison (venom) into the skin. Some insects can spread disease to people through a bite. However, most insectbites do not lead to disease and are not serious. What are the causes? Insects may bite for a variety of reasons, including: Hunger. To defend themselves. Insects that bite include: Spiders. Mosquitoes. Ticks. Fleas. Ants. Flies. Kissing bugs. Chiggers. What are the signs or symptoms? Symptoms of this condition include: Itching or pain in the bite area. Redness and swelling in the bite area. An open wound (skin ulcer). In many cases, symptoms last for 2-4 days. In rare cases, a person may have a severe allergic reaction (anaphylactic reaction) to a bite. Symptoms of an anaphylactic reaction may include: Feeling warm in the face (flushed). This may include redness. Itchy, red, swollen areas of skin (hives). Swelling of the eyes, lips, face, mouth, tongue, or throat. Difficulty breathing, speaking, or swallowing. Noisy breathing (wheezing). Dizziness or light-headedness. Fainting. Pain or cramping in the abdomen. Vomiting. Diarrhea. How is this diagnosed? This condition is usually diagnosed based on symptoms and a physical exam. How is this treated? Treatment is usually not needed. Symptoms often go away on their own. When treatment is recommended, it may involve: Applying a cream or lotion to the bite area. This treatment helps with itching. Taking an antibiotic medicine. This treatment is needed if the bite area gets infected. Getting a tetanus shot, if you are not up to date on this vaccine. Applying ice to the affected area. Allergy medicines called antihistamines. This treatment may be needed if you develop itching or an allergic reaction to the insect bite. Giving yourself an epinephrine injection if  you have an anaphylactic reaction to a bite. To give the injection, you will use what is commonly called an auto-injector "pen" (pre-filled automatic epinephrine injection device). Your health care provider will teach you how to use an auto-injector pen. Follow these instructions at home: Bite area care  Do not scratch the bite area. Keep the bite area clean and dry. Wash it every day with soap and water as told by your health care provider. Check the bite area every day for signs of infection. Check for: Redness, swelling, or pain. Fluid or blood. Warmth. Pus or a bad smell.  Managing pain, itching, and swelling  You may apply cortisone cream, calamine lotion, or a paste made of baking soda and water to the bite area as told by your health care provider. If directed, put ice on the bite area. Put ice in a plastic bag. Place a towel between your skin and the bag. Leave the ice on for 20 minutes, 2-3 times a day.  General instructions Apply or take over-the-counter and prescription medicines only as told by your health care provider. If you were prescribed an antibiotic medicine, take or apply it as told by your health care provider. Do not stop using the antibiotic even if your condition improves. Keep all follow-up visits as told by your health care provider. This is important. How is this prevented? To help reduce your risk of insect bites: When you are outdoors, wear clothing that covers your arms and legs. This is especially important in the early morning and evening. Use insect repellent. The best insect repellents contain DEET, picaridin, oil of lemon eucalyptus (OLE), or IR3535. Consider spraying your clothing with a pesticide called permethrin.  Permethrin helps prevent insect bites. It works for several weeks and for up to 5-6 clothing washes. Do not apply permethrin directly to the skin. If your home windows do not have screens, consider installing them. If you will be sleeping  in an area where there are mosquitoes, consider covering your sleeping area with a mosquito net. Contact a health care provider if: You have redness, swelling, or pain in the bite area. You have fluid or blood coming from the bite area. The bite area feels warm to the touch. You have pus or a bad smell coming from the bite area. You have a fever. Get help right away if: You have joint pain. You have a rash. You feel unusually tired or sleepy. You have neck pain. You have a headache. You have unusual weakness. You develop symptoms of an anaphylactic reaction. These may include: Flushed skin. Hives. Swelling of the eyes, lips, face, mouth, tongue, or throat. Difficulty breathing, speaking, or swallowing. Wheezing. Dizziness or light-headedness. Fainting. Pain or cramping in the abdomen. Vomiting. Diarrhea. These symptoms may represent a serious problem that is an emergency. Do not wait to see if the symptoms will go away. Do the following right away: Use the auto-injector pen as you have been instructed. Get medical help. Call your local emergency services (911 in the [place] Do not drive yourself to the hospital. Summary An insect bite can make your skin red, itchy, and swollen. Treatment is usually not needed. Symptoms often go away on their own. When treatment is recommended, it may involve taking medicine, applying medicine to the area, or applying ice. Apply or take over-the-counter and prescription medicines only as told by your health care provider. Use insect repellent to help prevent insect bites. Contact a health care provider if you have any signs of infection in the bite area. This information is not intended to replace advice given to you by your health care provider. Make sure you discuss any questions you have with your healthcare provider. Document Revised: 12/05/2017 Document Reviewed: 12/06/2017 [employer] Patient Education  [employer].

## 2020-12-27 NOTE — Progress Notes (Signed)
Subjective:  Patient ID: Melody Jenkins, female    DOB: [birth year]  Age: 55 y.o. MRN: [id number]  CC: Hypertension and Diabetes  This visit occurred during the SARS-CoV-2 public health emergency.  Safety protocols were in place, including screening questions prior to the visit, additional usage of staff PPE, and extensive cleaning of exam room while observing appropriate contact time as indicated for disinfecting solutions.    HPI Melody Jenkins presents for f/up -  She complains of 4 days ago something bit her on her left lateral calf.  She has a small blister  with itching and swelling.  She denies pain, fever, chills, chest pain, shortness of breath, wheezing, or swelling around her face/mouth/sore throat.  She takes hydroxyzine for anxiety and this has not helped with her symptoms.  Outpatient Medications Prior to Visit  Medication Sig Dispense Refill   Biotin 5000 MCG CAPS Take 1 capsule by mouth.     Blood Glucose Monitoring Suppl (BLOOD GLUCOSE SYSTEM PAK) KIT Please dispense based on patient and insurance preference. Use as directed to monitor FSBS 2x daily. Dx: E11.9 1 kit 1   Cholecalciferol (VITAMIN D3) 25 MCG (1000 UT) CAPS Take by mouth 2 (two) times daily.     cloNIDine (CATAPRES) 0.2 MG tablet Take 1 tablet (0.2 mg total) by mouth 2 (two) times daily. 180 tablet 3   Cyanocobalamin (VITAMIN B 12 PO) Take 1 tablet by mouth daily.      Dulaglutide (TRULICITY) 1.74 YC/1.4GY SOPN Inject 0.75 mg into the skin once a week. 4 mL 11   ELDERBERRY PO Take by mouth.     gabapentin (NEURONTIN) 300 MG capsule Take 600 mg by mouth 3 (three) times daily.     Glucose Blood (BLOOD GLUCOSE TEST STRIPS) STRP Please dispense based on patient and insurance preference. Use as directed to monitor FSBS 2x daily. Dx: E11.9 200 strip 1   indapamide (LOZOL) 1.25 MG tablet Take 1 tablet (1.25 mg total) by mouth daily. 90 tablet 0   Lancets MISC Please dispense based on patient and insurance  preference. Use as directed to monitor FSBS 2x daily. Dx: E11.9 200 each 1   lithium carbonate (LITHOBID) 300 MG CR tablet Take 2 tablets (600 mg total) by mouth 2 (two) times daily. 120 tablet 2   metoprolol succinate (TOPROL-XL) 100 MG 24 hr tablet TAKE 1 TABLET (100 MG TOTAL) BY MOUTH DAILY. TAKE WITH OR IMMEDIATELY FOLLOWING A MEAL. 90 tablet 3   mirtazapine (REMERON) 45 MG tablet Take 1 tablet (45 mg total) by mouth at bedtime. 30 tablet 2   oxyCODONE-acetaminophen (PERCOCET/ROXICET) 5-325 MG tablet Take by mouth 3 (three) times daily.     Plecanatide (TRULANCE) 3 MG TABS Take 3 mg by mouth daily. Lot: [id number], EXP: 05-2022 9 tablet 0   polyethylene glycol (MIRALAX) 17 g packet Take 17 g by mouth 2 (two) times daily. 14 each 0   pramoxine-hydrocortisone (PROCTOCREAM-HC) 1-1 % rectal cream Apply a pea-sized amount rectally 2 to 3 times a day 30 g 1   rosuvastatin (CRESTOR) 10 MG tablet Take 1 tablet (10 mg total) by mouth daily. 90 tablet 1   Turmeric (QC TUMERIC COMPLEX PO) Take by mouth.     vitamin C (ASCORBIC ACID) 500 MG tablet Take 500 mg by mouth daily.     hydrocortisone 2.5 % ointment Apply topically 2 (two) times daily. To affected areas on face 60 g 2   LORazepam (ATIVAN) 0.5 MG tablet Take  1 tablet (0.5 mg total) by mouth 2 (two) times daily. 60 tablet 2   meloxicam (MOBIC) 7.5 MG tablet Take 7.5 mg by mouth 2 (two) times daily.  5   No facility-administered medications prior to visit.    ROS Review of Systems  Constitutional:  Negative for diaphoresis, fatigue and unexpected weight change.  HENT: Negative.    Eyes: Negative.  Negative for visual disturbance.  Respiratory:  Negative for cough, chest tightness, shortness of breath and wheezing.   Cardiovascular:  Negative for chest pain, palpitations and leg swelling.  Gastrointestinal:  Negative for abdominal pain.  Endocrine: Negative.   Genitourinary: Negative.  Negative for difficulty urinating.  Musculoskeletal:  Negative.   Skin:  Positive for rash.  Neurological: Negative.   Hematological:  Negative for adenopathy. Does not bruise/bleed easily.   Objective:  BP 126/84 (BP Location: Left Arm, Patient Position: Sitting, Cuff Size: Large)   Pulse 88   Temp 98 F (36.7 C) (Oral)   Resp 16   Ht 5' 7.5" (1.715 m)   Wt 228 lb (103.4 kg)   LMP 12/17/2017   SpO2 97%   BMI 35.18 kg/m   BP Readings from Last 3 Encounters:  12/27/20 126/84  11/18/20 (!) 134/97  09/06/20 116/70    Wt Readings from Last 3 Encounters:  12/27/20 228 lb (103.4 kg)  11/18/20 227 lb (103 kg)  09/06/20 227 lb 9.6 oz (103.2 kg)    Physical Exam Vitals reviewed.  Constitutional:      General: She is not in acute distress.    Appearance: She is not ill-appearing, toxic-appearing or diaphoretic.  HENT:     Mouth/Throat:     Mouth: Mucous membranes are moist.  Eyes:     General: No scleral icterus.    Conjunctiva/sclera: Conjunctivae normal.  Cardiovascular:     Rate and Rhythm: Normal rate and regular rhythm.     Heart sounds: No murmur heard. Pulmonary:     Effort: Pulmonary effort is normal.     Breath sounds: No stridor. No wheezing, rhonchi or rales.  Abdominal:     General: Abdomen is flat.     Palpations: Abdomen is soft. There is no mass.     Tenderness: There is no abdominal tenderness. There is no guarding.  Musculoskeletal:        General: Normal range of motion.     Cervical back: Neck supple.  Lymphadenopathy:     Cervical: No cervical adenopathy.  Skin:    General: Skin is warm and dry.     Findings: Erythema and rash present.     Comments: Left lateral mid calf there is a vesicle surrounded by faint erythema and swelling.  There is no induration, fluctuance, tenderness, streaking, or exudate.  See photo.  Neurological:     General: No focal deficit present.     Mental Status: She is alert.    Lab Results  Component Value Date   WBC 6.8 08/17/2020   HGB 14.3 08/17/2020   HCT 43.9  08/17/2020   PLT 206.0 08/17/2020   GLUCOSE 114 (H) 12/27/2020   CHOL 147 01/13/2020   TRIG 91 01/13/2020   HDL 40 (L) 01/13/2020   LDLDIRECT 87 05/27/2019   LDLCALC 88 01/13/2020   ALT 14 05/16/2020   AST 11 05/16/2020   NA 140 12/27/2020   K 3.3 (L) 12/27/2020   CL 100 12/27/2020   CREATININE 0.94 12/27/2020   BUN 20 12/27/2020   CO2 33 (H) 12/27/2020  TSH 0.74 08/17/2020   HGBA1C 6.1 12/27/2020   MICROALBUR 0.3 01/13/2020    CT CARDIAC SCORING (SELF PAY ONLY)  Addendum Date: 10/07/2020   ADDENDUM REPORT: 10/07/2020 18:49 CLINICAL DATA:  Cardiovascular Disease Risk stratification EXAM: Coronary Calcium Score TECHNIQUE: A gated, non-contrast computed tomography scan of the heart was performed using 59m slice thickness. Axial images were analyzed on a dedicated workstation. Calcium scoring of the coronary arteries was performed using the Agatston method. FINDINGS: Coronary Calcium Score: Left main: 0 Left anterior descending artery: 0 Left circumflex artery: 0 Right coronary artery: 0 Total: 0 Percentile: NA Pericardium: Normal. Ascending Aorta: Normal caliber. Non-cardiac: [facility]. IMPRESSION: Coronary calcium score of 0. RECOMMENDATIONS: Coronary artery calcium (CAC) score is a strong predictor of incident coronary heart disease (CHD) and provides predictive information beyond traditional risk factors. CAC scoring is reasonable to use in the decision to withhold, postpone, or initiate statin therapy in intermediate-risk or selected borderline-risk asymptomatic adults (age 56-75years and LDL-C >=70 to <190 mg/dL) who do not have diabetes or established atherosclerotic cardiovascular disease (ASCVD).* In intermediate-risk (10-year ASCVD risk >=7.5% to <20%) adults or selected borderline-risk (10-year ASCVD risk >=5% to <7.5%) adults in whom a CAC score is measured for the purpose of making a treatment decision the following recommendations have been  made: If CAC=0, it is reasonable to withhold statin therapy and reassess in 5 to 10 years, as long as higher risk conditions are absent (diabetes mellitus, family history of premature CHD in first degree relatives (males <55 years; females <65 years), cigarette smoking, or LDL >=190 mg/dL). If CAC is 1 to 99, it is reasonable to initiate statin therapy for patients >=557years of age. If CAC is >=100 or >=75th percentile, it is reasonable to initiate statin therapy at any age. Cardiology referral should be considered for patients with CAC scores >=400 or >=75th percentile. *2018 AHA/ACC/AACVPR/AAPA/ABC/ACPM/ADA/AGS/APhA/ASPC/NLA/PCNA Guideline on the Management of Blood Cholesterol: A Report of the American College of Cardiology/American Heart Association Task Force on Clinical Practice Guidelines. J Am Coll Cardiol. 2019;73(24):3168-3209. WEleonore Chiquito MD Electronically Signed   By: WEleonore Chiquito  On: 10/07/2020 18:49   Result Date: 10/07/2020 EXAM: OVER-READ INTERPRETATION  CT CHEST The following report is an over-read performed by radiologist Dr. DVinnie Langtonof [facility], [place] 10/07/2020. This over-read does not include interpretation of cardiac or coronary anatomy or pathology. The coronary calcium score/coronary CTA interpretation by the cardiologist is attached. COMPARISON:  None. FINDINGS: Within the visualized portions of the thorax there are no suspicious appearing pulmonary nodules or masses, there is no acute consolidative airspace disease, no pleural effusions, no pneumothorax and no lymphadenopathy. Visualized portions of the upper abdomen are unremarkable. There are no aggressive appearing lytic or blastic lesions noted in the visualized portions of the skeleton. IMPRESSION: 1. No significant incidental noncardiac findings are noted. Electronically Signed: By: DVinnie LangtonM.D. On: 10/07/2020 10:38   ECHOCARDIOGRAM COMPLETE  Result Date: 10/07/2020    ECHOCARDIOGRAM REPORT    Patient Name:   Melody ALBERDate of Exam: 10/07/2020 Medical Rec #:  [id number]           Height:       67.5 in Accession #:    [id number]          Weight:       227.6 lb Date of Birth:  9July 28, 1967            BSA:  2.148 m Patient Age:    50 years             BP:           116/70 mmHg Patient Gender: F                    HR:           90 bpm. Exam Location:  [address] Procedure: 2D Echo, 3D Echo, Cardiac Doppler, Color Doppler and Strain Analysis Indications:    R01.1 Murmur  History:        Patient has no prior history of Echocardiogram examinations.                 Risk Factors:Hypertension, Diabetes, Dyslipidemia, Current                 Smoker, Family History of Coronary Artery Disease and Obesity.  Sonographer:    Jessee Avers, RDCS Referring Phys: [id number] [place]  1. Left ventricular ejection fraction, by estimation, is 55 to 60%. Left ventricular ejection fraction by 3D volume is 55 %. The left ventricle has normal function. The left ventricle has no regional wall motion abnormalities. Left ventricular diastolic  parameters are consistent with Grade I diastolic dysfunction (impaired relaxation). The average left ventricular global longitudinal strain is -24.0 %. The global longitudinal strain is normal.  2. Right ventricular systolic function is normal. The right ventricular size is normal.  3. Left atrial size was mildly dilated.  4. The mitral valve is normal in structure. No evidence of mitral valve regurgitation. No evidence of mitral stenosis.  5. The aortic valve is normal in structure. Aortic valve regurgitation is not visualized. No aortic stenosis is present.  6. The inferior vena cava is normal in size with greater than 50% respiratory variability, suggesting right atrial pressure of 3 mmHg. FINDINGS  Left Ventricle: Left ventricular ejection fraction, by estimation, is 55 to 60%. Left ventricular ejection fraction by 3D volume is 55 %. The left  ventricle has normal function. The left ventricle has no regional wall motion abnormalities. The average left ventricular global longitudinal strain is -24.0 %. The global longitudinal strain is normal. The left ventricular internal cavity size was normal in size. There is no left ventricular hypertrophy. Left ventricular diastolic parameters are consistent  with Grade I diastolic dysfunction (impaired relaxation). Normal left ventricular filling pressure. Right Ventricle: The right ventricular size is normal. No increase in right ventricular wall thickness. Right ventricular systolic function is normal. Left Atrium: Left atrial size was mildly dilated. Right Atrium: Right atrial size was normal in size. Pericardium: There is no evidence of pericardial effusion. Mitral Valve: The mitral valve is normal in structure. No evidence of mitral valve regurgitation. No evidence of mitral valve stenosis. Tricuspid Valve: The tricuspid valve is normal in structure. Tricuspid valve regurgitation is not demonstrated. No evidence of tricuspid stenosis. Aortic Valve: The aortic valve is normal in structure. Aortic valve regurgitation is not visualized. No aortic stenosis is present. Pulmonic Valve: The pulmonic valve was normal in structure. Pulmonic valve regurgitation is not visualized. No evidence of pulmonic stenosis. Aorta: The aortic root is normal in size and structure. Venous: The inferior vena cava is normal in size with greater than 50% respiratory variability, suggesting right atrial pressure of 3 mmHg. IAS/Shunts: No atrial level shunt detected by color flow Doppler.  LEFT VENTRICLE PLAX 2D  Diastology LVIDd:        4.70 cm LV e' medial:    7.18 cm/s LVIDs:        3.30 cm LV E/e' medial:  10.3 LV PW:        1.10 cm LV e' lateral:   10.20 cm/s LV IVS:       1.00 cm LV E/e' lateral: 7.3 LVOT diam:    2.10 cm LV SV:        85      2D Longitudinal LV SV Index:  39      Strain LVOT Area:    3.46    2D Strain  GLS     -25.2 %               cm     (A2C):                       2D Strain GLS     -23.6 %                       (A3C):                       2D Strain GLS     -23.2 %                       (A4C):                       2D Strain GLS     -24.0 %                       Avg:                        3D Volume EF                       LV 3D EF:    Left ventricular                                    ejection                                    fraction by 3D                                    volume is 55 %.                        3D Volume EF:                       3D EF:        55 %                       LV EDV:       148 ml                       LV ESV:       67 ml  LV SV:        81 ml RIGHT VENTRICLE RV Basal diam:  3.10 cm RV S prime:     9.76 cm/s TAPSE (M-mode): 2.6 cm LEFT ATRIUM             Index       RIGHT ATRIUM           Index LA diam:        4.50 cm 2.09 cm/m  RA Pressure: 3.00 mmHg LA Vol (A2C):   59.2 ml 27.56 ml/m RA Area:     13.80 cm LA Vol (A4C):   72.7 ml 33.84 ml/m RA Volume:   34.20 ml  15.92 ml/m LA Biplane Vol: 65.8 ml 30.63 ml/m  AORTIC VALVE LVOT Vmax:   110.00 cm/s LVOT Vmean:  72.300 cm/s LVOT VTI:    0.244 m  AORTA Ao Root diam: 3.30 cm Ao Asc diam:  3.20 cm MITRAL VALVE               TRICUSPID VALVE                            Estimated RAP:  3.00 mmHg  MV E velocity: 74.10 cm/s  SHUNTS MV A velocity: 84.00 cm/s  Systemic VTI:  0.24 m MV E/A ratio:  0.88        Systemic Diam: 2.10 cm Dani Gobble Croitoru MD Electronically signed by Sanda Klein MD Signature Date/Time: 10/07/2020/1:24:25 PM    Final     Assessment & Plan:   Melody Jenkins was seen today for hypertension and diabetes.  Diagnoses and all orders for this visit:  Essential hypertension, benign- Her BP is well controlled. -     Basic metabolic panel; Future -     Basic metabolic panel -     potassium chloride (KLOR-CON 10) 10 MEQ tablet; Take 1 tablet (10 mEq total) by mouth 3 (three) times daily.  Type  2 diabetes mellitus with hyperlipidemia ([place])- Her blood sugar is very well controlled. -     Basic metabolic panel; Future -     Hemoglobin A1c; Future -     Hemoglobin A1c -     Basic metabolic panel  Allergic reaction to insect sting, accidental or unintentional, initial encounter- Will treat with topical and systemic steroids. -     hydrocortisone 2.5 % ointment; Apply topically 2 (two) times daily. To affected areas on face -     methylPREDNISolone acetate (DEPO-MEDROL) injection 80 mg  Diuretic-induced hypokalemia -     potassium chloride (KLOR-CON 10) 10 MEQ tablet; Take 1 tablet (10 mEq total) by mouth 3 (three) times daily.  Need for shingles vaccine -     Zoster Vaccine Adjuvanted [facility]) injection; Inject 0.5 mLs into the muscle once for 1 dose.  I have discontinued Melody Jenkins's meloxicam and LORazepam. I am also having her start on potassium chloride and Shingrix. Additionally, I am having her maintain her vitamin C, Cyanocobalamin (VITAMIN B 12 PO), Biotin, gabapentin, Vitamin D3, oxyCODONE-acetaminophen, Turmeric (QC TUMERIC COMPLEX PO), ELDERBERRY PO, cloNIDine, metoprolol succinate, Blood Glucose System Pak, Lancets, BLOOD GLUCOSE TEST STRIPS, Trulicity, indapamide, Trulance, polyethylene glycol, pramoxine-hydrocortisone, lithium carbonate, mirtazapine, rosuvastatin, and hydrocortisone. We administered methylPREDNISolone acetate.  Meds ordered this encounter  Medications   hydrocortisone 2.5 % ointment    Sig: Apply topically 2 (two) times daily. To affected areas on face    Dispense:  60 g    Refill:  2   methylPREDNISolone acetate (DEPO-MEDROL) injection 80 mg   potassium chloride (KLOR-CON 10) 10 MEQ tablet    Sig: Take 1 tablet (10 mEq total) by mouth 3 (three) times daily.    Dispense:  270 tablet    Refill:  0   Zoster Vaccine Adjuvanted [facility]) injection    Sig: Inject 0.5 mLs into the muscle once for 1 dose.    Dispense:  0.5 mL    Refill:  1       Follow-up: Return if symptoms worsen or fail to improve.  Scarlette Calico, MD

## 2020-12-28 ENCOUNTER — Encounter (HOSPITAL_COMMUNITY): Payer: Self-pay

## 2021-01-05 ENCOUNTER — Other Ambulatory Visit: Payer: Self-pay

## 2021-01-05 ENCOUNTER — Telehealth (INDEPENDENT_AMBULATORY_CARE_PROVIDER_SITE_OTHER): Payer: Medicare HMO | Admitting: Psychiatry

## 2021-01-05 DIAGNOSIS — F4310 Post-traumatic stress disorder, unspecified: Secondary | ICD-10-CM

## 2021-01-05 DIAGNOSIS — F411 Generalized anxiety disorder: Secondary | ICD-10-CM

## 2021-01-05 DIAGNOSIS — F333 Major depressive disorder, recurrent, severe with psychotic symptoms: Secondary | ICD-10-CM

## 2021-01-05 DIAGNOSIS — G4701 Insomnia due to medical condition: Secondary | ICD-10-CM

## 2021-01-05 MED ORDER — HYDROXYZINE HCL 50 MG PO TABS: 50.0000 mg | ORAL_TABLET | Freq: Three times a day (TID) | ORAL | 2 refills | Status: DC

## 2021-01-05 MED ORDER — LITHIUM CARBONATE ER 300 MG PO TBCR: 600.0000 mg | EXTENDED_RELEASE_TABLET | Freq: Two times a day (BID) | ORAL | 2 refills | Status: DC

## 2021-01-05 MED ORDER — MIRTAZAPINE 45 MG PO TABS: 45.0000 mg | ORAL_TABLET | Freq: Every day | ORAL | 2 refills | Status: DC

## 2021-01-05 NOTE — Progress Notes (Signed)
Virtual Visit via Telephone Note  I connected with Melody Jenkins on 01/05/21 at  9:30 AM EDT by telephone and verified that I am speaking with the correct person using two identifiers.  Location: Patient: home Provider: office   I discussed the limitations, risks, security and privacy concerns of performing an evaluation and management service by telephone and the availability of in person appointments. I also discussed with the patient that there may be a patient responsible charge related to this service. The patient expressed understanding and agreed to proceed.   History of Present Illness: "I am a lot better since I talked to you". Melody Jenkins's dad died suddenly of a heart attack last month. It was really hard on her and she had a melt down due to the funeral. Melody Jenkins was related his death to Melody Jenkins's and it was just too much for her. It was bring up a lot of bad memories. She was very depressed and crying. Her PTSD was worse for a while after. She had a couple of therapy sessions after and it helped. She is better in terms of getting over their deaths now. Today is her deceased brother's birthday and today is the day Melody Jenkins went into the ICU. and she is trying to deal with it. Melody Jenkins realizes that things in life keeping happening. "It is easy to say it but hard to deal with it". She is trying to do therapy sessions 2x/month. Melody Jenkins is feeling agitated by hearing about other's peoples problems. She can't deal with other's issues because she has so much of her own. Between now and September is a really hard time for her. She is thinking of going on vacation to relax. She has to deal with his death anniversary. Melody Jenkins is thinking of dating again because she is lonely. She has joined some dating sites. Melody Jenkins went to her pain doctor and had to show him her benzo tabs to help him understand that she only took a few tabs but it was ineffective. She went back to Vistaril and it has helped her  anxiety more. She went to her cardiologist and was told that she is ok to go back on Vistaril as it was not contributing to her heart mummer. She took the benzo to the pharmacy and put it in the disposal bin. Her depression comes and goes. She cries randomly. At times she is overly fatigued and wants to isolate. When she goes out she spends until her money is gone outside of her major bills. It makes her feel good at the time but worse after. Melody Jenkins admits she is not paying some smaller bills as much as she was before. She denies SI/HI.    Observations/Objective:  General Appearance: unable to assess  Eye Contact:  unable to assess  Speech:  Clear and Coherent and Normal Rate  Volume:  Normal  Mood:  Anxious and Depressed  Affect:  Congruent  Thought Process:  Coherent and Descriptions of Associations: Circumstantial  Orientation:  Full (Time, Place, and Person)  Thought Content:  Rumination  Suicidal Thoughts:  No  Homicidal Thoughts:  No  Memory:  Immediate;   Good  Judgement:  Good  Insight:  Good  Psychomotor Activity: unable to assess  Concentration:  Concentration: Good  Recall:  Good  Fund of Knowledge:  Good  Language:  Good  Akathisia:  unable to assess  Handed:  unable to assess  AIMS (if indicated):     Assets:  Communication Skills Desire for Improvement  Financial Resources/Insurance Housing Resilience Social Support Talents/Skills Transportation Vocational/Educational  ADL's:  unable to assess  Cognition:  WNL  Sleep:        Assessment and Plan: Aseret does not want to change her meds. They are effective  She is going to therapy 2x/month due to the cost  Recommended self help book- The Complex PTSD workbook   1. MDD (major depressive disorder), recurrent, severe, with psychosis ([place]) - lithium carbonate (LITHOBID) 300 MG CR tablet; Take 2 tablets (600 mg total) by mouth 2 (two) times daily.  Dispense: 120 tablet; Refill: 2 - mirtazapine (REMERON) 45  MG tablet; Take 1 tablet (45 mg total) by mouth at bedtime.  Dispense: 30 tablet; Refill: 2  2. GAD (generalized anxiety disorder) - mirtazapine (REMERON) 45 MG tablet; Take 1 tablet (45 mg total) by mouth at bedtime.  Dispense: 30 tablet; Refill: 2 - hydrOXYzine (ATARAX/VISTARIL) 50 MG tablet; Take 1 tablet (50 mg total) by mouth 3 (three) times daily.  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  Dispense: 30 tablet; Refill: 2 - hydrOXYzine (ATARAX/VISTARIL) 50 MG tablet; Take 1 tablet (50 mg total) by mouth 3 (three) times daily.  Dispense: 90 tablet; Refill: 2  4. PTSD (post-traumatic stress disorder) - mirtazapine (REMERON) 45 MG tablet; Take 1 tablet (45 mg total) by mouth at bedtime.  Dispense: 30 tablet; Refill: 2     Follow Up Instructions: In 6 weeks or sooner if needed   I discussed the assessment and treatment plan with the patient. The patient was provided an opportunity to ask questions and all were answered. The patient agreed with the plan and demonstrated an understanding of the instructions.   The patient was advised to call back or seek an in-person evaluation if the symptoms worsen or if the condition fails to improve as anticipated.  I provided 33 minutes of non-face-to-face time during this encounter.   Charlcie Cradle, MD

## 2021-01-29 ENCOUNTER — Encounter: Payer: Self-pay | Admitting: Gastroenterology

## 2021-01-31 NOTE — Progress Notes (Signed)
55 y.o. [id number] Single Black or African American Not Hispanic or Latino female here for annual exam.  No vaginal bleeding. Not sexually active.    She has chronic lower back pain. Had to stop steroid injections secondary to worsening of her diabetes. Last HgbA1C was 6.1%  Gets constipated from her medication.   Occasional urge incontinence, it has gotten better. Tolerable.   She has a Location manager.   Patient's last menstrual period was 12/17/2017.          Sexually active: No.  The current method of family planning is none.    Exercising: No.  Smoker:  yes  Health Maintenance: Pap:  01/28/20 WNL Hr HPV Neg, 12/25/18 WNL HPV Neg,   History of abnormal Pap:  yes ECC 09/17/17 WNL  MMG:  3/9-22 BMD:   none  Colonoscopy: 11/30/20 f/u 5 years  TDaP:  02/20/19  Gardasil: n/a   reports that she has been smoking cigarettes. She has been smoking an average of .25 packs per day. She has never used smokeless tobacco. She reports that she does not drink alcohol and does not use drugs. She is on disability for chronic back pain. Daughter is local, has a foster children.   Past Medical History:  Diagnosis Date   Anemia    Anxiety    Arthritis    Asthma    Carpal tunnel syndrome    Chronic back pain    Depression    Diabetes mellitus without complication (HCC)    Essential hypertension    Heart murmur    History of diabetes mellitus    History of multiple miscarriages    Hyperlipidemia    Torn rotator cuff     Past Surgical History:  Procedure Laterality Date   BREAST SURGERY  2000   reduction   CARPAL TUNNEL RELEASE     left   CESAREAN SECTION     COSMETIC SURGERY  2000   breast reduction   REDUCTION MAMMAPLASTY Bilateral    ROTATOR CUFF REPAIR  10/2019    Current Outpatient Medications  Medication Sig Dispense Refill   Biotin 5000 MCG CAPS Take 1 capsule by mouth.     Blood Glucose Monitoring Suppl (BLOOD GLUCOSE SYSTEM PAK) KIT Please dispense based on patient  and insurance preference. Use as directed to monitor FSBS 2x daily. Dx: E11.9 1 kit 1   Cholecalciferol (VITAMIN D3) 25 MCG (1000 UT) CAPS Take by mouth 2 (two) times daily.     cloNIDine (CATAPRES) 0.2 MG tablet Take 1 tablet (0.2 mg total) by mouth 2 (two) times daily. 180 tablet 3   Cyanocobalamin (VITAMIN B 12 PO) Take 1 tablet by mouth daily.      Dulaglutide (TRULICITY) 3.79 KW/4.0XB SOPN Inject 0.75 mg into the skin once a week. 4 mL 11   ELDERBERRY PO Take by mouth.     gabapentin (NEURONTIN) 300 MG capsule Take 600 mg by mouth 3 (three) times daily.     Glucose Blood (BLOOD GLUCOSE TEST STRIPS) STRP Please dispense based on patient and insurance preference. Use as directed to monitor FSBS 2x daily. Dx: E11.9 200 strip 1   hydrocortisone 2.5 % ointment Apply topically 2 (two) times daily. To affected areas on face 60 g 2   hydrOXYzine (ATARAX/VISTARIL) 50 MG tablet Take 1 tablet (50 mg total) by mouth 3 (three) times daily. 90 tablet 2   indapamide (LOZOL) 1.25 MG tablet Take 1 tablet (1.25 mg total) by mouth daily. 90 tablet  0   Lancets MISC Please dispense based on patient and insurance preference. Use as directed to monitor FSBS 2x daily. Dx: E11.9 200 each 1   lithium carbonate (LITHOBID) 300 MG CR tablet Take 2 tablets (600 mg total) by mouth 2 (two) times daily. 120 tablet 2   metoprolol succinate (TOPROL-XL) 100 MG 24 hr tablet TAKE 1 TABLET (100 MG TOTAL) BY MOUTH DAILY. TAKE WITH OR IMMEDIATELY FOLLOWING A MEAL. 90 tablet 3   mirtazapine (REMERON) 45 MG tablet Take 1 tablet (45 mg total) by mouth at bedtime. 30 tablet 2   oxyCODONE-acetaminophen (PERCOCET/ROXICET) 5-325 MG tablet Take by mouth 3 (three) times daily.     Plecanatide (TRULANCE) 3 MG TABS Take 3 mg by mouth daily. Lot: [id number], EXP: 05-2022 9 tablet 0   polyethylene glycol (MIRALAX) 17 g packet Take 17 g by mouth 2 (two) times daily. 14 each 0   potassium chloride (KLOR-CON 10) 10 MEQ tablet Take 1 tablet (10 mEq total)  by mouth 3 (three) times daily. 270 tablet 0   pramoxine-hydrocortisone (PROCTOCREAM-HC) 1-1 % rectal cream Apply a pea-sized amount rectally 2 to 3 times a day 30 g 1   rosuvastatin (CRESTOR) 10 MG tablet Take 1 tablet (10 mg total) by mouth daily. 90 tablet 1   Turmeric (QC TUMERIC COMPLEX PO) Take by mouth.     vitamin C (ASCORBIC ACID) 500 MG tablet Take 500 mg by mouth daily.     No current facility-administered medications for this visit.    Family History  Problem Relation Age of Onset   Diabetes Mother    Hypertension Mother    Breast cancer Mother 72   Hypertension Father    Heart disease Father    Early death Brother    Kidney disease Maternal Grandmother    Heart disease Brother    Colon cancer Neg Hx    Rectal cancer Neg Hx    Esophageal cancer Neg Hx    Pancreatic cancer Neg Hx    Liver disease Neg Hx    Stomach cancer Neg Hx     Review of Systems  All other systems reviewed and are negative.  Exam:   BP 122/78 (Cuff Size: Large)   Pulse 73   Ht _0  (1.727 m)   Wt 224 lb (101.6 kg)   LMP 12/17/2017   SpO2 98%   BMI 34.06 kg/m   Weight change: _1 @ Height:   Height: _2  (172.7 cm)  Ht Readings from Last 3 Encounters:  02/02/21 _3  (1.727 m)  12/27/20 5' 7.5" (1.715 m)  11/18/20 5' 7.5" (1.715 m)    General appearance: alert, cooperative and appears stated age Head: Normocephalic, without obvious abnormality, atraumatic Neck: no adenopathy, supple, symmetrical, trachea midline and thyroid normal to inspection and palpation Lungs: clear to auscultation bilaterally Cardiovascular: regular rate and rhythm Breasts: normal appearance, no masses or tenderness Abdomen: soft, non-tender; non distended,  no masses,  no organomegaly Extremities: extremities normal, atraumatic, no cyanosis or edema Skin: Skin color, texture, turgor normal. No rashes or lesions Lymph nodes: Cervical, supraclavicular, and axillary nodes normal. No abnormal inguinal  nodes palpated Neurologic: Grossly normal   Pelvic: External genitalia:  no lesions              Urethra:  normal appearing urethra with no masses, tenderness or lesions              Bartholins and Skenes: normal  Vagina: atrophic appearing vagina with normal color and discharge, no lesions              Cervix: no lesions               Bimanual Exam:  Uterus:   no masses or tenderness              Adnexa: no mass, fullness, tenderness               Rectovaginal: Confirms               Anus:  normal sphincter tone, no lesions  Gae Dry chaperoned for the exam.  1. Well woman exam No pap this year Mammogram and colonoscopy are UTD Labs with primary Discussed breast self exam Discussed calcium and vit D intake

## 2021-02-02 ENCOUNTER — Other Ambulatory Visit: Payer: Self-pay

## 2021-02-02 ENCOUNTER — Ambulatory Visit (INDEPENDENT_AMBULATORY_CARE_PROVIDER_SITE_OTHER): Payer: Medicare HMO | Admitting: Obstetrics and Gynecology

## 2021-02-02 ENCOUNTER — Encounter: Payer: Self-pay | Admitting: Obstetrics and Gynecology

## 2021-02-02 ENCOUNTER — Other Ambulatory Visit: Payer: Self-pay | Admitting: Internal Medicine

## 2021-02-02 VITALS — BP 122/78 | HR 73 | Ht 68.0 in | Wt 224.0 lb

## 2021-02-02 DIAGNOSIS — I10 Essential (primary) hypertension: Secondary | ICD-10-CM

## 2021-02-02 DIAGNOSIS — Z01419 Encounter for gynecological examination (general) (routine) without abnormal findings: Secondary | ICD-10-CM

## 2021-02-02 DIAGNOSIS — Z0000 Encounter for general adult medical examination without abnormal findings: Secondary | ICD-10-CM | POA: Diagnosis not present

## 2021-02-02 NOTE — Patient Instructions (Signed)

## 2021-02-09 ENCOUNTER — Telehealth (HOSPITAL_COMMUNITY): Payer: Medicare HMO | Admitting: Psychiatry

## 2021-02-09 ENCOUNTER — Other Ambulatory Visit: Payer: Self-pay

## 2021-02-09 ENCOUNTER — Other Ambulatory Visit (HOSPITAL_COMMUNITY): Payer: Self-pay | Admitting: Psychiatry

## 2021-02-09 ENCOUNTER — Other Ambulatory Visit: Payer: Self-pay | Admitting: Internal Medicine

## 2021-02-09 ENCOUNTER — Telehealth: Payer: Self-pay | Admitting: Internal Medicine

## 2021-02-09 DIAGNOSIS — I10 Essential (primary) hypertension: Secondary | ICD-10-CM

## 2021-02-09 DIAGNOSIS — G4701 Insomnia due to medical condition: Secondary | ICD-10-CM

## 2021-02-09 DIAGNOSIS — F333 Major depressive disorder, recurrent, severe with psychotic symptoms: Secondary | ICD-10-CM

## 2021-02-09 DIAGNOSIS — R9431 Abnormal electrocardiogram [ECG] [EKG]: Secondary | ICD-10-CM

## 2021-02-09 DIAGNOSIS — F411 Generalized anxiety disorder: Secondary | ICD-10-CM

## 2021-02-09 DIAGNOSIS — F4310 Post-traumatic stress disorder, unspecified: Secondary | ICD-10-CM

## 2021-02-09 MED ORDER — MIRTAZAPINE 45 MG PO TABS: 45.0000 mg | ORAL_TABLET | Freq: Every day | ORAL | 0 refills | Status: DC

## 2021-02-09 MED ORDER — METOPROLOL SUCCINATE ER 100 MG PO TB24: 100.0000 mg | ORAL_TABLET | Freq: Every day | ORAL | 0 refills | Status: DC

## 2021-02-09 MED ORDER — LITHIUM CARBONATE ER 300 MG PO TBCR: 600.0000 mg | EXTENDED_RELEASE_TABLET | Freq: Two times a day (BID) | ORAL | 0 refills | Status: DC

## 2021-02-09 MED ORDER — INDAPAMIDE 1.25 MG PO TABS: 1.2500 mg | ORAL_TABLET | Freq: Every day | ORAL | 0 refills | Status: DC

## 2021-02-09 MED ORDER — HYDROXYZINE HCL 50 MG PO TABS: 50.0000 mg | ORAL_TABLET | Freq: Three times a day (TID) | ORAL | 0 refills | Status: DC

## 2021-02-09 NOTE — Telephone Encounter (Signed)
[employer] calling on behalf of patient to request short supply be sent to local pharmacy Patient has no medication remaining  indapamide (LOZOL) 1.25 MG tablet  metoprolol succinate (TOPROL-XL) 100 MG 24 hr tablet   [place], [place]

## 2021-02-09 NOTE — Progress Notes (Signed)
I called the patient at our scheduled appointment time and again 2 more times. There was no answer and it went straight to voicemail each time. I was unable to leave a voice message as the mailbox had not been set up.    I will provide 30 day supply of meds.

## 2021-02-12 ENCOUNTER — Telehealth: Payer: Self-pay | Admitting: Internal Medicine

## 2021-02-14 ENCOUNTER — Other Ambulatory Visit: Payer: Self-pay

## 2021-02-14 DIAGNOSIS — E785 Hyperlipidemia, unspecified: Secondary | ICD-10-CM

## 2021-02-14 DIAGNOSIS — E876 Hypokalemia: Secondary | ICD-10-CM

## 2021-02-14 DIAGNOSIS — I10 Essential (primary) hypertension: Secondary | ICD-10-CM

## 2021-02-14 DIAGNOSIS — E1169 Type 2 diabetes mellitus with other specified complication: Secondary | ICD-10-CM

## 2021-02-14 DIAGNOSIS — F411 Generalized anxiety disorder: Secondary | ICD-10-CM

## 2021-02-14 MED ORDER — CLONIDINE HCL 0.2 MG PO TABS: 0.2000 mg | ORAL_TABLET | Freq: Two times a day (BID) | ORAL | 0 refills | Status: DC

## 2021-02-14 MED ORDER — POTASSIUM CHLORIDE ER 10 MEQ PO TBCR: 10.0000 meq | EXTENDED_RELEASE_TABLET | Freq: Three times a day (TID) | ORAL | 0 refills | Status: DC

## 2021-02-14 MED ORDER — TRULICITY 0.75 MG/0.5ML ~~LOC~~ SOAJ: 0.7500 mg | SUBCUTANEOUS | 2 refills | Status: DC

## 2021-02-27 ENCOUNTER — Encounter: Payer: Self-pay | Admitting: Internal Medicine

## 2021-02-28 ENCOUNTER — Other Ambulatory Visit: Payer: Self-pay | Admitting: Internal Medicine

## 2021-02-28 DIAGNOSIS — R9431 Abnormal electrocardiogram [ECG] [EKG]: Secondary | ICD-10-CM

## 2021-02-28 DIAGNOSIS — I10 Essential (primary) hypertension: Secondary | ICD-10-CM

## 2021-02-28 MED ORDER — METOPROLOL SUCCINATE ER 100 MG PO TB24: 100.0000 mg | ORAL_TABLET | Freq: Every day | ORAL | 0 refills | Status: DC

## 2021-03-02 ENCOUNTER — Encounter: Payer: Self-pay | Admitting: Internal Medicine

## 2021-03-06 NOTE — Telephone Encounter (Signed)
Tried calling pharmacy to see if pt was able to go to pharmacy to correct this matter. No answer or vm.  I just spoke with the pharmacist and he has stated the 7 day supply that they gave you was from [place].. he stated if you look right below the name of the medication it says "MFG: Zybus". If you feel as though you are having a reaction to this medication the pharmacist said please call him and let them know. Also I would like for you to be seen again by a doctor as well to check up on you.  I sent pt a MyChart tto call office at  [phone] to schedule an apptmnt to come in.

## 2021-03-23 DIAGNOSIS — F329 Major depressive disorder, single episode, unspecified: Secondary | ICD-10-CM | POA: Diagnosis not present

## 2021-03-23 DIAGNOSIS — M47816 Spondylosis without myelopathy or radiculopathy, lumbar region: Secondary | ICD-10-CM | POA: Diagnosis not present

## 2021-03-23 DIAGNOSIS — Z79899 Other long term (current) drug therapy: Secondary | ICD-10-CM | POA: Diagnosis not present

## 2021-03-23 DIAGNOSIS — M5416 Radiculopathy, lumbar region: Secondary | ICD-10-CM | POA: Diagnosis not present

## 2021-04-05 ENCOUNTER — Encounter: Payer: Self-pay | Admitting: Internal Medicine

## 2021-04-05 ENCOUNTER — Other Ambulatory Visit: Payer: Self-pay

## 2021-04-05 ENCOUNTER — Ambulatory Visit (INDEPENDENT_AMBULATORY_CARE_PROVIDER_SITE_OTHER): Payer: Medicare HMO | Admitting: Internal Medicine

## 2021-04-05 VITALS — BP 128/84 | HR 69 | Temp 98.3°F | Resp 16 | Ht 68.0 in | Wt 219.0 lb

## 2021-04-05 DIAGNOSIS — E876 Hypokalemia: Secondary | ICD-10-CM

## 2021-04-05 DIAGNOSIS — E1169 Type 2 diabetes mellitus with other specified complication: Secondary | ICD-10-CM | POA: Diagnosis not present

## 2021-04-05 DIAGNOSIS — Z0000 Encounter for general adult medical examination without abnormal findings: Secondary | ICD-10-CM | POA: Diagnosis not present

## 2021-04-05 DIAGNOSIS — T502X5A Adverse effect of carbonic-anhydrase inhibitors, benzothiadiazides and other diuretics, initial encounter: Secondary | ICD-10-CM

## 2021-04-05 DIAGNOSIS — Z0001 Encounter for general adult medical examination with abnormal findings: Secondary | ICD-10-CM

## 2021-04-05 DIAGNOSIS — E785 Hyperlipidemia, unspecified: Secondary | ICD-10-CM | POA: Diagnosis not present

## 2021-04-05 DIAGNOSIS — I10 Essential (primary) hypertension: Secondary | ICD-10-CM | POA: Diagnosis not present

## 2021-04-05 DIAGNOSIS — Z23 Encounter for immunization: Secondary | ICD-10-CM

## 2021-04-05 LAB — MAGNESIUM: Magnesium: 2 mg/dL (ref 1.5–2.5)

## 2021-04-05 LAB — URINALYSIS, ROUTINE W REFLEX MICROSCOPIC
Bilirubin Urine: NEGATIVE
Ketones, ur: NEGATIVE
Leukocytes,Ua: NEGATIVE
Nitrite: NEGATIVE
Specific Gravity, Urine: >=1.03 — AB (ref 1.000–1.030)
Total Protein, Urine: NEGATIVE
Urine Glucose: NEGATIVE
Urobilinogen, UA: 0.2 (ref 0.0–1.0)
pH: 6 (ref 5.0–8.0)

## 2021-04-05 LAB — LIPID PANEL
Cholesterol: 243 mg/dL — ABNORMAL HIGH (ref 0–200)
HDL: 37.8 mg/dL — ABNORMAL LOW (ref >39.00)
LDL Cholesterol: 176 mg/dL — ABNORMAL HIGH (ref 0–99)
NonHDL: 205.03
Total CHOL/HDL Ratio: 6
Triglycerides: 146 mg/dL (ref 0.0–149.0)
VLDL: 29.2 mg/dL (ref 0.0–40.0)

## 2021-04-05 LAB — MICROALBUMIN / CREATININE URINE RATIO
Creatinine,U: 213.3 mg/dL
Microalb Creat Ratio: 0.5 mg/g (ref 0.0–30.0)
Microalb, Ur: 1 mg/dL (ref 0.0–1.9)

## 2021-04-05 LAB — HEMOGLOBIN A1C: Hgb A1c MFr Bld: 6.2 % (ref 4.6–6.5)

## 2021-04-05 LAB — BASIC METABOLIC PANEL
BUN: 18 mg/dL (ref 6–23)
CO2: 32 mEq/L (ref 19–32)
Calcium: 10.1 mg/dL (ref 8.4–10.5)
Chloride: 101 mEq/L (ref 96–112)
Creatinine, Ser: 0.92 mg/dL (ref 0.40–1.20)
GFR: 69.78 mL/min (ref >60.00)
Glucose, Bld: 117 mg/dL — ABNORMAL HIGH (ref 70–99)
Potassium: 3.6 mEq/L (ref 3.5–5.1)
Sodium: 141 mEq/L (ref 135–145)

## 2021-04-05 MED ORDER — SHINGRIX 50 MCG/0.5ML IM SUSR: 0.5000 mL | Freq: Once | INTRAMUSCULAR | 1 refills | Status: AC

## 2021-04-05 MED ORDER — ROSUVASTATIN CALCIUM 10 MG PO TABS: 10.0000 mg | ORAL_TABLET | Freq: Every day | ORAL | 1 refills | Status: DC

## 2021-04-05 NOTE — Progress Notes (Signed)
Subjective:  Patient ID: Melody Jenkins, female    DOB: 1965/08/30  Age: 55 y.o. MRN: [id number]  CC: Annual Exam, Diabetes, Hypertension, and Hyperlipidemia  This visit occurred during the SARS-CoV-2 public health emergency.  Safety protocols were in place, including screening questions prior to the visit, additional usage of staff PPE, and extensive cleaning of exam room while observing appropriate contact time as indicated for disinfecting solutions.    HPI Arbutus Nelligan presents for a CPX and f/up -   She tells me her blood pressure has been well controlled.  She is active and denies CP, DOE, palpitations, edema, or fatigue.  She complains of chronic, unchanged low back pain.  Outpatient Medications Prior to Visit  Medication Sig Dispense Refill   Biotin 5000 MCG CAPS Take 1 capsule by mouth.     Blood Glucose Monitoring Suppl (BLOOD GLUCOSE SYSTEM PAK) KIT Please dispense based on patient and insurance preference. Use as directed to monitor FSBS 2x daily. Dx: E11.9 1 kit 1   Cholecalciferol (VITAMIN D3) 25 MCG (1000 UT) CAPS Take by mouth 2 (two) times daily.     cloNIDine (CATAPRES) 0.2 MG tablet Take 1 tablet (0.2 mg total) by mouth 2 (two) times daily. 180 tablet 0   Cyanocobalamin (VITAMIN B 12 PO) Take 1 tablet by mouth daily.      Dulaglutide (TRULICITY) 0.93 GH/8.2XH SOPN Inject 0.75 mg into the skin once a week. 4 mL 2   gabapentin (NEURONTIN) 300 MG capsule Take 600 mg by mouth 3 (three) times daily.     Glucose Blood (BLOOD GLUCOSE TEST STRIPS) STRP Please dispense based on patient and insurance preference. Use as directed to monitor FSBS 2x daily. Dx: E11.9 200 strip 1   hydrocortisone 2.5 % ointment Apply topically 2 (two) times daily. To affected areas on face 60 g 2   hydrOXYzine (ATARAX/VISTARIL) 50 MG tablet Take 1 tablet (50 mg total) by mouth 3 (three) times daily. 90 tablet 0   indapamide (LOZOL) 1.25 MG tablet Take 1 tablet (1.25 mg total) by mouth  daily. 90 tablet 0   Lancets MISC Please dispense based on patient and insurance preference. Use as directed to monitor FSBS 2x daily. Dx: E11.9 200 each 1   metoprolol succinate (TOPROL-XL) 100 MG 24 hr tablet Take 1 tablet (100 mg total) by mouth daily. Take with or immediately following a meal. 90 tablet 0   mirtazapine (REMERON) 45 MG tablet Take 1 tablet (45 mg total) by mouth at bedtime. 30 tablet 0   Plecanatide (TRULANCE) 3 MG TABS Take 3 mg by mouth daily. Lot: [id number], EXP: 05-2022 9 tablet 0   polyethylene glycol (MIRALAX) 17 g packet Take 17 g by mouth 2 (two) times daily. 14 each 0   potassium chloride (KLOR-CON 10) 10 MEQ tablet Take 1 tablet (10 mEq total) by mouth 3 (three) times daily. 270 tablet 0   pramoxine-hydrocortisone (PROCTOCREAM-HC) 1-1 % rectal cream Apply a pea-sized amount rectally 2 to 3 times a day 30 g 1   Turmeric (QC TUMERIC COMPLEX PO) Take by mouth.     vitamin C (ASCORBIC ACID) 500 MG tablet Take 500 mg by mouth daily.     lithium carbonate (LITHOBID) 300 MG CR tablet Take 2 tablets (600 mg total) by mouth 2 (two) times daily. 120 tablet 0   rosuvastatin (CRESTOR) 10 MG tablet Take 1 tablet (10 mg total) by mouth daily. 90 tablet 1   No facility-administered medications prior to  visit.    ROS Review of Systems  Constitutional:  Negative for appetite change, fatigue and unexpected weight change.  HENT: Negative.    Eyes: Negative.   Respiratory:  Negative for cough, chest tightness, shortness of breath and wheezing.   Cardiovascular:  Negative for chest pain, palpitations and leg swelling.  Gastrointestinal:  Negative for abdominal pain, constipation, diarrhea, nausea and vomiting.  Endocrine: Negative.   Genitourinary: Negative.  Negative for difficulty urinating and dysuria.  Musculoskeletal:  Positive for back pain. Negative for arthralgias and myalgias.  Skin: Negative.  Negative for color change and pallor.  Neurological:  Negative for dizziness,  weakness, light-headedness and headaches.  Hematological:  Negative for adenopathy. Does not bruise/bleed easily.  Psychiatric/Behavioral: Negative.     Objective:  BP 128/84 (BP Location: Left Arm, Patient Position: Sitting, Cuff Size: Large)   Pulse 69   Temp 98.3 F (36.8 C) (Oral)   Resp 16   Ht '5\' 8"'  (1.727 m)   Wt 219 lb (99.3 kg)   LMP 12/17/2017   SpO2 99%   BMI 33.30 kg/m   BP Readings from Last 3 Encounters:  04/05/21 128/84  02/02/21 122/78  12/27/20 126/84    Wt Readings from Last 3 Encounters:  04/05/21 219 lb (99.3 kg)  02/02/21 224 lb (101.6 kg)  12/27/20 228 lb (103.4 kg)    Physical Exam Vitals reviewed.  Constitutional:      General: She is not in acute distress.    Appearance: She is obese. She is not ill-appearing, toxic-appearing or diaphoretic.  HENT:     Nose: Nose normal.     Mouth/Throat:     Mouth: Mucous membranes are moist.  Eyes:     Conjunctiva/sclera: Conjunctivae normal.  Cardiovascular:     Rate and Rhythm: Normal rate and regular rhythm.     Heart sounds: No murmur heard. Pulmonary:     Effort: Pulmonary effort is normal.     Breath sounds: No stridor. No wheezing, rhonchi or rales.  Abdominal:     General: Abdomen is protuberant. Bowel sounds are normal. There is no distension.     Palpations: Abdomen is soft. There is no hepatomegaly, splenomegaly or mass.     Tenderness: There is no abdominal tenderness. There is no guarding.  Musculoskeletal:        General: Normal range of motion.     Cervical back: Neck supple.     Right lower leg: No edema.     Left lower leg: No edema.  Lymphadenopathy:     Cervical: No cervical adenopathy.  Skin:    General: Skin is warm and dry.  Neurological:     General: No focal deficit present.     Mental Status: She is alert.  Psychiatric:        Mood and Affect: Mood normal.        Behavior: Behavior normal.    Lab Results  Component Value Date   WBC 6.8 08/17/2020   HGB 14.3  08/17/2020   HCT 43.9 08/17/2020   PLT 206.0 08/17/2020   GLUCOSE 117 (H) 04/05/2021   CHOL 243 (H) 04/05/2021   TRIG 146.0 04/05/2021   HDL 37.80 (L) 04/05/2021   LDLDIRECT 87 05/27/2019   LDLCALC 176 (H) 04/05/2021   ALT 14 05/16/2020   AST 11 05/16/2020   NA 141 04/05/2021   K 3.6 04/05/2021   CL 101 04/05/2021   CREATININE 0.92 04/05/2021   BUN 18 04/05/2021   CO2 32 04/05/2021  TSH 0.74 08/17/2020   HGBA1C 6.2 04/05/2021   MICROALBUR 1.0 04/05/2021    CT CARDIAC SCORING (SELF PAY ONLY)  Addendum Date: 10/07/2020   ADDENDUM REPORT: 10/07/2020 18:49 CLINICAL DATA:  Cardiovascular Disease Risk stratification EXAM: Coronary Calcium Score TECHNIQUE: A gated, non-contrast computed tomography scan of the heart was performed using 47m slice thickness. Axial images were analyzed on a dedicated workstation. Calcium scoring of the coronary arteries was performed using the Agatston method. FINDINGS: Coronary Calcium Score: Left main: 0 Left anterior descending artery: 0 Left circumflex artery: 0 Right coronary artery: 0 Total: 0 Percentile: NA Pericardium: Normal. Ascending Aorta: Normal caliber. Non-cardiac: [facility]. IMPRESSION: Coronary calcium score of 0. RECOMMENDATIONS: Coronary artery calcium (CAC) score is a strong predictor of incident coronary heart disease (CHD) and provides predictive information beyond traditional risk factors. CAC scoring is reasonable to use in the decision to withhold, postpone, or initiate statin therapy in intermediate-risk or selected borderline-risk asymptomatic adults (age 56-75years and LDL-C >=70 to <190 mg/dL) who do not have diabetes or established atherosclerotic cardiovascular disease (ASCVD).* In intermediate-risk (10-year ASCVD risk >=7.5% to <20%) adults or selected borderline-risk (10-year ASCVD risk >=5% to <7.5%) adults in whom a CAC score is measured for the purpose of making a treatment decision the following  recommendations have been made: If CAC=0, it is reasonable to withhold statin therapy and reassess in 5 to 10 years, as long as higher risk conditions are absent (diabetes mellitus, family history of premature CHD in first degree relatives (males <55 years; females <65 years), cigarette smoking, or LDL >=190 mg/dL). If CAC is 1 to 99, it is reasonable to initiate statin therapy for patients >=530years of age. If CAC is >=100 or >=75th percentile, it is reasonable to initiate statin therapy at any age. Cardiology referral should be considered for patients with CAC scores >=400 or >=75th percentile. *2018 AHA/ACC/AACVPR/AAPA/ABC/ACPM/ADA/AGS/APhA/ASPC/NLA/PCNA Guideline on the Management of Blood Cholesterol: A Report of the American College of Cardiology/American Heart Association Task Force on Clinical Practice Guidelines. J Am Coll Cardiol. 2019;73(24):3168-3209. WEleonore Chiquito MD Electronically Signed   By: WEleonore Chiquito  On: 10/07/2020 18:49   Result Date: 10/07/2020 EXAM: OVER-READ INTERPRETATION  CT CHEST The following report is an over-read performed by radiologist Dr. DVinnie Langtonof [facility] [place] 10/07/2020. This over-read does not include interpretation of cardiac or coronary anatomy or pathology. The coronary calcium score/coronary CTA interpretation by the cardiologist is attached. COMPARISON:  None. FINDINGS: Within the visualized portions of the thorax there are no suspicious appearing pulmonary nodules or masses, there is no acute consolidative airspace disease, no pleural effusions, no pneumothorax and no lymphadenopathy. Visualized portions of the upper abdomen are unremarkable. There are no aggressive appearing lytic or blastic lesions noted in the visualized portions of the skeleton. IMPRESSION: 1. No significant incidental noncardiac findings are noted. Electronically Signed: By: DVinnie LangtonM.D. On: 10/07/2020 10:38   ECHOCARDIOGRAM COMPLETE  Result Date: 10/07/2020     ECHOCARDIOGRAM REPORT   Patient Name:   FJEANE CASHATTDate of Exam: 10/07/2020 Medical Rec #:  [id number]           Height:       67.5 in Accession #:    [id number]          Weight:       227.6 lb Date of Birth:  916-Sep-1967            BSA:  2.148 m Patient Age:    20 years             BP:           116/70 mmHg Patient Gender: F                    HR:           90 bpm. Exam Location:  [address] Procedure: 2D Echo, 3D Echo, Cardiac Doppler, Color Doppler and Strain Analysis Indications:    R01.1 Murmur  History:        Patient has no prior history of Echocardiogram examinations.                 Risk Factors:Hypertension, Diabetes, Dyslipidemia, Current                 Smoker, Family History of Coronary Artery Disease and Obesity.  Sonographer:    Jessee Avers, RDCS Referring Phys: [id number] [place]  1. Left ventricular ejection fraction, by estimation, is 55 to 60%. Left ventricular ejection fraction by 3D volume is 55 %. The left ventricle has normal function. The left ventricle has no regional wall motion abnormalities. Left ventricular diastolic  parameters are consistent with Grade I diastolic dysfunction (impaired relaxation). The average left ventricular global longitudinal strain is -24.0 %. The global longitudinal strain is normal.  2. Right ventricular systolic function is normal. The right ventricular size is normal.  3. Left atrial size was mildly dilated.  4. The mitral valve is normal in structure. No evidence of mitral valve regurgitation. No evidence of mitral stenosis.  5. The aortic valve is normal in structure. Aortic valve regurgitation is not visualized. No aortic stenosis is present.  6. The inferior vena cava is normal in size with greater than 50% respiratory variability, suggesting right atrial pressure of 3 mmHg. FINDINGS  Left Ventricle: Left ventricular ejection fraction, by estimation, is 55 to 60%. Left ventricular ejection fraction by 3D volume  is 55 %. The left ventricle has normal function. The left ventricle has no regional wall motion abnormalities. The average left ventricular global longitudinal strain is -24.0 %. The global longitudinal strain is normal. The left ventricular internal cavity size was normal in size. There is no left ventricular hypertrophy. Left ventricular diastolic parameters are consistent  with Grade I diastolic dysfunction (impaired relaxation). Normal left ventricular filling pressure. Right Ventricle: The right ventricular size is normal. No increase in right ventricular wall thickness. Right ventricular systolic function is normal. Left Atrium: Left atrial size was mildly dilated. Right Atrium: Right atrial size was normal in size. Pericardium: There is no evidence of pericardial effusion. Mitral Valve: The mitral valve is normal in structure. No evidence of mitral valve regurgitation. No evidence of mitral valve stenosis. Tricuspid Valve: The tricuspid valve is normal in structure. Tricuspid valve regurgitation is not demonstrated. No evidence of tricuspid stenosis. Aortic Valve: The aortic valve is normal in structure. Aortic valve regurgitation is not visualized. No aortic stenosis is present. Pulmonic Valve: The pulmonic valve was normal in structure. Pulmonic valve regurgitation is not visualized. No evidence of pulmonic stenosis. Aorta: The aortic root is normal in size and structure. Venous: The inferior vena cava is normal in size with greater than 50% respiratory variability, suggesting right atrial pressure of 3 mmHg. IAS/Shunts: No atrial level shunt detected by color flow Doppler.  LEFT VENTRICLE PLAX 2D  Diastology LVIDd:        4.70 cm LV e' medial:    7.18 cm/s LVIDs:        3.30 cm LV E/e' medial:  10.3 LV PW:        1.10 cm LV e' lateral:   10.20 cm/s LV IVS:       1.00 cm LV E/e' lateral: 7.3 LVOT diam:    2.10 cm LV SV:        85      2D Longitudinal LV SV Index:  39      Strain LVOT Area:     3.46    2D Strain GLS     -25.2 %               cm     (A2C):                       2D Strain GLS     -23.6 %                       (A3C):                       2D Strain GLS     -23.2 %                       (A4C):                       2D Strain GLS     -24.0 %                       Avg:                        3D Volume EF                       LV 3D EF:    Left ventricular                                    ejection                                    fraction by 3D                                    volume is 55 %.                        3D Volume EF:                       3D EF:        55 %                       LV EDV:       148 ml                       LV ESV:       67 ml  LV SV:        81 ml RIGHT VENTRICLE RV Basal diam:  3.10 cm RV S prime:     9.76 cm/s TAPSE (M-mode): 2.6 cm LEFT ATRIUM             Index       RIGHT ATRIUM           Index LA diam:        4.50 cm 2.09 cm/m  RA Pressure: 3.00 mmHg LA Vol (A2C):   59.2 ml 27.56 ml/m RA Area:     13.80 cm LA Vol (A4C):   72.7 ml 33.84 ml/m RA Volume:   34.20 ml  15.92 ml/m LA Biplane Vol: 65.8 ml 30.63 ml/m  AORTIC VALVE LVOT Vmax:   110.00 cm/s LVOT Vmean:  72.300 cm/s LVOT VTI:    0.244 m  AORTA Ao Root diam: 3.30 cm Ao Asc diam:  3.20 cm MITRAL VALVE               TRICUSPID VALVE                            Estimated RAP:  3.00 mmHg  MV E velocity: 74.10 cm/s  SHUNTS MV A velocity: 84.00 cm/s  Systemic VTI:  0.24 m MV E/A ratio:  0.88        Systemic Diam: 2.10 cm Dani Gobble Croitoru MD Electronically signed by Sanda Klein MD Signature Date/Time: 10/07/2020/1:24:25 PM    Final     Assessment & Plan:   Vilma was seen today for annual exam, diabetes, hypertension and hyperlipidemia.  Diagnoses and all orders for this visit:  Essential hypertension, benign-her blood pressure is adequately well controlled.  Electrolytes and renal function are normal. -     Basic metabolic panel; Future -     Urinalysis, Routine w reflex  microscopic; Future -     Magnesium; Future -     Magnesium -     Urinalysis, Routine w reflex microscopic -     Basic metabolic panel  Type 2 diabetes mellitus with hyperlipidemia (HCC)-Her blood sugar is adequately well controlled. -     Microalbumin / creatinine urine ratio; Future -     Hemoglobin A1c; Future -     HM Diabetes Foot Exam -     Ambulatory referral to Ophthalmology -     Hemoglobin A1c -     Microalbumin / creatinine urine ratio  Diuretic-induced hypokalemia- Will continue the potassium supplement. -     Magnesium; Future -     Magnesium  Hyperlipidemia with target LDL less than 100- She has not achieved her LDL goal.  Will restart the statin. -     Lipid panel; Future -     Lipid panel  Encounter for general adult medical examination with abnormal findings- Exam completed, labs reviewed, vaccines reviewed and updated, cancer screenings are up-to-date, patient education was given.  Need for vaccination -     Pneumococcal conjugate vaccine 20-valent  Need for shingles vaccine -     Zoster Vaccine Adjuvanted [facility]) injection; Inject 0.5 mLs into the muscle once for 1 dose.  Hyperlipidemia LDL goal <100 -     rosuvastatin (CRESTOR) 10 MG tablet; Take 1 tablet (10 mg total) by mouth daily.  Other orders -     Flu Vaccine QUAD 6+ mos PF IM (Fluarix Quad PF)  I have discontinued Joaquim Lai L. Reinecke's lithium carbonate. I am  also having her start on Shingrix. Additionally, I am having her maintain her vitamin C, Cyanocobalamin (VITAMIN B 12 PO), Biotin, gabapentin, Vitamin D3, Turmeric (QC TUMERIC COMPLEX PO), Blood Glucose System Pak, Lancets, BLOOD GLUCOSE TEST STRIPS, Trulance, polyethylene glycol, pramoxine-hydrocortisone, hydrocortisone, indapamide, hydrOXYzine, mirtazapine, potassium chloride, cloNIDine, Trulicity, metoprolol succinate, and rosuvastatin.  Meds ordered this encounter  Medications   Zoster Vaccine Adjuvanted [facility]) injection    Sig: Inject  0.5 mLs into the muscle once for 1 dose.    Dispense:  0.5 mL    Refill:  1   rosuvastatin (CRESTOR) 10 MG tablet    Sig: Take 1 tablet (10 mg total) by mouth daily.    Dispense:  90 tablet    Refill:  1      Follow-up: Return in about 6 months (around 10/04/2021).  Scarlette Calico, MD

## 2021-04-05 NOTE — Patient Instructions (Signed)
Health Maintenance, Female Adopting a healthy lifestyle and getting preventive care are important in promoting health and wellness. Ask your health care provider about: The right schedule for you to have regular tests and exams. Things you can do on your own to prevent diseases and keep yourself healthy. What should I know about diet, weight, and exercise? Eat a healthy diet  Eat a diet that includes plenty of vegetables, fruits, low-fat dairy products, and lean protein. Do not eat a lot of foods that are high in solid fats, added sugars, or sodium. Maintain a healthy weight Body mass index (BMI) is used to identify weight problems. It estimates body fat based on height and weight. Your health care provider can help determine your BMI and help you achieve or maintain a healthy weight. Get regular exercise Get regular exercise. This is one of the most important things you can do for your health. Most adults should: Exercise for at least 150 minutes each week. The exercise should increase your heart rate and make you sweat (moderate-intensity exercise). Do strengthening exercises at least twice a week. This is in addition to the moderate-intensity exercise. Spend less time sitting. Even light physical activity can be beneficial. Watch cholesterol and blood lipids Have your blood tested for lipids and cholesterol at 56 years of age, then have this test every 5 years. Have your cholesterol levels checked more often if: Your lipid or cholesterol levels are high. You are older than 56 years of age. You are at high risk for heart disease. What should I know about cancer screening? Depending on your health history and family history, you may need to have cancer screening at various ages. This may include screening for: Breast cancer. Cervical cancer. Colorectal cancer. Skin cancer. Lung cancer. What should I know about heart disease, diabetes, and high blood pressure? Blood pressure and heart  disease High blood pressure causes heart disease and increases the risk of stroke. This is more likely to develop in people who have high blood pressure readings, are of African descent, or are overweight. Have your blood pressure checked: Every 3-5 years if you are 18-39 years of age. Every year if you are 40 years old or older. Diabetes Have regular diabetes screenings. This checks your fasting blood sugar level. Have the screening done: Once every three years after age 40 if you are at a normal weight and have a low risk for diabetes. More often and at a younger age if you are overweight or have a high risk for diabetes. What should I know about preventing infection? Hepatitis B If you have a higher risk for hepatitis B, you should be screened for this virus. Talk with your health care provider to find out if you are at risk for hepatitis B infection. Hepatitis C Testing is recommended for: Everyone born from 1945 through 1965. Anyone with known risk factors for hepatitis C. Sexually transmitted infections (STIs) Get screened for STIs, including gonorrhea and chlamydia, if: You are sexually active and are younger than 56 years of age. You are older than 56 years of age and your health care provider tells you that you are at risk for this type of infection. Your sexual activity has changed since you were last screened, and you are at increased risk for chlamydia or gonorrhea. Ask your health care provider if you are at risk. Ask your health care provider about whether you are at high risk for HIV. Your health care provider may recommend a prescription medicine   to help prevent HIV infection. If you choose to take medicine to prevent HIV, you should first get tested for HIV. You should then be tested every 3 months for as long as you are taking the medicine. Pregnancy If you are about to stop having your period (premenopausal) and you may become pregnant, seek counseling before you get  pregnant. Take 400 to 800 micrograms (mcg) of folic acid every day if you become pregnant. Ask for birth control (contraception) if you want to prevent pregnancy. Osteoporosis and menopause Osteoporosis is a disease in which the bones lose minerals and strength with aging. This can result in bone fractures. If you are 65 years old or older, or if you are at risk for osteoporosis and fractures, ask your health care provider if you should: Be screened for bone loss. Take a calcium or vitamin D supplement to lower your risk of fractures. Be given hormone replacement therapy (HRT) to treat symptoms of menopause. Follow these instructions at home: Lifestyle Do not use any products that contain nicotine or tobacco, such as cigarettes, e-cigarettes, and chewing tobacco. If you need help quitting, ask your health care provider. Do not use street drugs. Do not share needles. Ask your health care provider for help if you need support or information about quitting drugs. Alcohol use Do not drink alcohol if: Your health care provider tells you not to drink. You are pregnant, may be pregnant, or are planning to become pregnant. If you drink alcohol: Limit how much you use to 0-1 drink a day. Limit intake if you are breastfeeding. Be aware of how much alcohol is in your drink. In the [place] one drink equals one 12 oz bottle of beer (355 mL), one 5 oz glass of wine (148 mL), or one 1 oz glass of hard liquor (44 mL). General instructions Schedule regular health, dental, and eye exams. Stay current with your vaccines. Tell your health care provider if: You often feel depressed. You have ever been abused or do not feel safe at home. Summary Adopting a healthy lifestyle and getting preventive care are important in promoting health and wellness. Follow your health care provider's instructions about healthy diet, exercising, and getting tested or screened for diseases. Follow your health care provider's  instructions on monitoring your cholesterol and blood pressure. This information is not intended to replace advice given to you by your health care provider. Make sure you discuss any questions you have with your health care provider. Document Revised: 08/05/2020 Document Reviewed: 05/21/2018 Elsevier Patient Education  2022 [employer]  

## 2021-04-14 ENCOUNTER — Other Ambulatory Visit: Payer: Self-pay | Admitting: Internal Medicine

## 2021-04-14 DIAGNOSIS — I10 Essential (primary) hypertension: Secondary | ICD-10-CM

## 2021-04-14 DIAGNOSIS — R9431 Abnormal electrocardiogram [ECG] [EKG]: Secondary | ICD-10-CM

## 2021-04-18 ENCOUNTER — Other Ambulatory Visit: Payer: Self-pay | Admitting: Internal Medicine

## 2021-04-18 DIAGNOSIS — F411 Generalized anxiety disorder: Secondary | ICD-10-CM

## 2021-04-24 ENCOUNTER — Ambulatory Visit (INDEPENDENT_AMBULATORY_CARE_PROVIDER_SITE_OTHER): Payer: Medicare HMO

## 2021-04-24 ENCOUNTER — Other Ambulatory Visit: Payer: Self-pay

## 2021-04-24 VITALS — BP 122/70 | HR 95 | Temp 98.0°F | Resp 16 | Ht 62.0 in | Wt 218.2 lb

## 2021-04-24 DIAGNOSIS — Z0000 Encounter for general adult medical examination without abnormal findings: Secondary | ICD-10-CM

## 2021-04-24 NOTE — Progress Notes (Addendum)
Subjective:   Melody Jenkins is a 55 y.o. female who presents for Medicare Annual (Subsequent) preventive examination.  Review of Systems     Cardiac Risk Factors include: obesity (BMI >30kg/m2);smoking/ tobacco exposure;diabetes mellitus;dyslipidemia;family history of premature cardiovascular disease;hypertension     Objective:    Today's Vitals   04/24/21 0926  BP: 122/70  Pulse: 95  Resp: 16  Temp: 98 F (36.7 C)  TempSrc: Tympanic  SpO2: 99%  Weight: 218 lb 3.2 oz (99 kg)  Height: _0  (1.575 m)  PainSc: 6    Body mass index is 39.91 kg/m.  Advanced Directives 04/24/2021 01/13/2020 10/30/2019 09/24/2018 09/05/2017 08/01/2017 12/07/2015  Does Patient Have a Medical Advance Directive? Yes _1  No  Type of Advance Directive [place]  Does patient want to make changes to medical advance directive? Yes (MAU/Ambulatory/Procedural Areas - Information given) - - - - - -  Copy of [place] in Chart? No - copy requested - - - - - -  Would patient like information on creating a medical advance directive? - Yes (MAU/Ambulatory/Procedural Areas - Information given) No - Patient declined Yes (ED - Information included in AVS) - No - Patient declined No - patient declined information  Some encounter information is confidential and restricted. Go to Review Flowsheets activity to see all data.    Current Medications (verified) Outpatient Encounter Medications as of 04/24/2021  Medication Sig   Biotin 5000 MCG CAPS Take 1 capsule by mouth.   Blood Glucose Monitoring Suppl (BLOOD GLUCOSE SYSTEM PAK) KIT Please dispense based on patient and insurance preference. Use as directed to monitor FSBS 2x daily. Dx: E11.9   Cholecalciferol (VITAMIN D3) 25 MCG (1000 UT) CAPS Take by mouth 2 (two) times daily.   cloNIDine (CATAPRES) 0.2 MG tablet TAKE 1 TABLET TWICE DAILY   Cyanocobalamin (VITAMIN B 12 PO) Take 1 tablet by mouth daily.     Dulaglutide (TRULICITY) 2.94 TM/5.4YT SOPN Inject 0.75 mg into the skin once a week.   gabapentin (NEURONTIN) 300 MG capsule Take 600 mg by mouth 3 (three) times daily.   Glucose Blood (BLOOD GLUCOSE TEST STRIPS) STRP Please dispense based on patient and insurance preference. Use as directed to monitor FSBS 2x daily. Dx: E11.9   hydrocortisone 2.5 % ointment Apply topically 2 (two) times daily. To affected areas on face   hydrOXYzine (ATARAX/VISTARIL) 50 MG tablet Take 1 tablet (50 mg total) by mouth 3 (three) times daily.   indapamide (LOZOL) 1.25 MG tablet Take 1 tablet (1.25 mg total) by mouth daily.   Lancets MISC Please dispense based on patient and insurance preference. Use as directed to monitor FSBS 2x daily. Dx: E11.9   metoprolol succinate (TOPROL-XL) 100 MG 24 hr tablet TAKE 1 TABLET EVERY DAY WITH OR IMMEDIATELY FOLLOWING A MEAL   mirtazapine (REMERON) 45 MG tablet Take 1 tablet (45 mg total) by mouth at bedtime.   Plecanatide (TRULANCE) 3 MG TABS Take 3 mg by mouth daily. Lot: [id number], EXP: 05-2022   polyethylene glycol (MIRALAX) 17 g packet Take 17 g by mouth 2 (two) times daily.   potassium chloride (KLOR-CON 10) 10 MEQ tablet Take 1 tablet (10 mEq total) by mouth 3 (three) times daily.   pramoxine-hydrocortisone (PROCTOCREAM-HC) 1-1 % rectal cream Apply a pea-sized amount rectally 2 to 3 times a day   rosuvastatin (CRESTOR) 10 MG tablet Take 1 tablet (10 mg total) by  mouth daily.   No facility-administered encounter medications on file as of 04/24/2021.    Allergies (verified) Trazodone and nefazodone and Tape   History: Past Medical History:  Diagnosis Date   Anemia    Anxiety    Arthritis    Asthma    Carpal tunnel syndrome    Chronic back pain    Depression    Diabetes mellitus without complication (HCC)    Essential hypertension    Heart murmur    History of diabetes mellitus    History of multiple miscarriages    Hyperlipidemia    Torn rotator cuff    Past  Surgical History:  Procedure Laterality Date   BREAST SURGERY  2000   reduction   CARPAL TUNNEL RELEASE     left   CESAREAN SECTION     COSMETIC SURGERY  2000   breast reduction   REDUCTION MAMMAPLASTY Bilateral    ROTATOR CUFF REPAIR  10/2019   Family History  Problem Relation Age of Onset   Diabetes Mother    Hypertension Mother    Breast cancer Mother 73   Hypertension Father    Heart disease Father    Early death Brother    Kidney disease Maternal Grandmother    Heart disease Brother    Colon cancer Neg Hx    Rectal cancer Neg Hx    Esophageal cancer Neg Hx    Pancreatic cancer Neg Hx    Liver disease Neg Hx    Stomach cancer Neg Hx    Social History   Socioeconomic History   Marital status: Single    Spouse name: Not on file   Number of children: 1   Years of education: Not on file   Highest education level: Not on file  Occupational History   Occupation: disablity  Tobacco Use   Smoking status: Some Days    Packs/day: 0.25    Types: Cigarettes   Smokeless tobacco: Never  Vaping Use   Vaping Use: Never used  Substance and Sexual Activity   Alcohol use: No   Drug use: No   Sexual activity: Not Currently    Partners: Male    Birth control/protection: Post-menopausal  Other Topics Concern   Not on file  Social History Narrative   Not on file   Social Determinants of Health   Financial Resource Strain: Low Risk    Difficulty of Paying Living Expenses: Not hard at all  Food Insecurity: No Food Insecurity   Worried About Charity fundraiser in the Last Year: Never true   Ran Out of Food in the Last Year: Never true  Transportation Needs: No Transportation Needs   Lack of Transportation (Medical): No   Lack of Transportation (Non-Medical): No  Physical Activity: Inactive   Days of Exercise per Week: 0 days   Minutes of Exercise per Session: 0 min  Stress: No Stress Concern Present   Feeling of Stress : Not at all  Social Connections: Not on file     Tobacco Counseling Ready to quit: Not Answered Counseling given: Not Answered   Clinical Intake:  Pre-visit preparation completed: Yes  Pain : 0-10 Pain Score: 6  Pain Type: Chronic pain Pain Location: Back Pain Descriptors / Indicators: Constant, Discomfort Pain Onset: More than a month ago Pain Frequency: Constant Pain Relieving Factors: Pain Management Effect of Pain on Daily Activities: Pain produces disability and affects the quality of life.  Pain Relieving Factors: Pain Management  BMI - recorded:  39.91 Nutritional Status: BMI > 30  Obese Nutritional Risks: None Diabetes: Yes CBG done?: No Did pt. bring in CBG monitor from home?: No  How often do you need to have someone help you when you read instructions, pamphlets, or other written materials from your doctor or pharmacy?: 1 - Never What is the last grade level you completed in school?: High School Graduate  Diabetic? yes  Interpreter Needed?: No  Information entered by :: Lisette Abu, LPN   Activities of Daily Living In your present state of health, do you have any difficulty performing the following activities: 04/24/2021  Hearing? N  Vision? N  Difficulty concentrating or making decisions? N  Walking or climbing stairs? Y  Dressing or bathing? N  Doing errands, shopping? N  Preparing Food and eating ? N  Using the Toilet? N  In the past six months, have you accidently leaked urine? N  Do you have problems with loss of bowel control? N  Managing your Medications? N  Managing your Finances? N  Housekeeping or managing your Housekeeping? Y  Some recent data might be hidden    Patient Care Team: Janith Lima, MD as PCP - General (Internal Medicine) Freada Bergeron, MD as PCP - Cardiology (Cardiology) Marlaine Hind, MD as Consulting Physician (Physical Medicine and Rehabilitation) Salvadore Dom, MD as Consulting Physician (Obstetrics and Gynecology) Laurence Aly, OD as  Consulting Physician (Optometry)  Indicate any recent Medical Services you may have received from other than [facility] providers in the past year (date may be approximate).     Assessment:   This is a routine wellness examination for [employer].  Hearing/Vision screen Hearing Screening - Comments:: Patient denied any hearing difficulty. No hearing aids. Vision Screening - Comments:: Patient wears glasses.  Next [facility] scheduled for [facility]/2021 [facility].  Dietary issues and exercise activities discussed: Current Exercise Habits: The patient does not participate in regular exercise at present, Exercise limited by: neurologic condition(s)   Goals Addressed   None   Depression Screen PHQ 2/9 Scores 04/24/2021 08/17/2020 01/13/2020 01/13/2020 08/25/2019 06/22/2019 05/27/2019  PHQ - 2 Score 0 0 1 0 1 0 0  PHQ- 9 Score - 0 7 - - - -  Some encounter information is confidential and restricted. Go to Review Flowsheets activity to see all data.    Fall Risk Fall Risk  04/24/2021 01/13/2020 08/25/2019 06/22/2019 05/27/2019  Falls in the past year? 0 0 0 0 1  Number falls in past yr: 0 - - - 1  Injury with Fall? 0 - - - -  Risk for fall due to : No Fall Risks Impaired balance/gait;Impaired mobility;Orthopedic patient Impaired balance/gait History of fall(s);Impaired balance/gait Orthopedic patient;Impaired balance/gait  Follow up _0     FALL RISK PREVENTION PERTAINING TO THE HOME:  Any stairs in or around the home? No  If so, are there any without handrails? No  Home free of loose throw rugs in walkways, pet beds, electrical cords, etc? Yes  Adequate lighting in your home to reduce risk of falls? Yes   ASSISTIVE DEVICES UTILIZED TO PREVENT FALLS:  Life alert? No  Use of a cane, walker or w/c? Yes  Grab bars in the bathroom? Yes  Shower chair or bench in  shower? Yes  Elevated toilet seat or a handicapped toilet? Yes   TIMED UP AND GO:  Was the test performed? Yes .  Length of time to ambulate 10 feet: 8 sec.   Gait steady and fast with assistive device  Cognitive Function: Normal cognitive status assessed by direct observation by this Nurse Health Advisor. No abnormalities found.          Immunizations Immunization History  Administered Date(s) Administered   Influenza,inj,Quad PF,6+ Mos 04/29/2018, 02/20/2019, 05/16/2020, 04/05/2021   Influenza-Unspecified 01/11/2018   Moderna Sars-Covid-2 Vaccination 12/10/2019, 01/05/2020   PNEUMOCOCCAL CONJUGATE-20 04/05/2021   Pneumococcal Polysaccharide-23 04/06/2015   Tdap 02/20/2019    TDAP status: Up to date  Flu Vaccine status: Up to date  Pneumococcal vaccine status: Up to date  Covid-19 vaccine status: Completed vaccines  Qualifies for Shingles Vaccine? Yes   Zostavax completed No   Shingrix Completed?: No.    Education has been provided regarding the importance of this vaccine. Patient has been advised to call insurance company to determine out of pocket expense if they have not yet received this vaccine. Advised may also receive vaccine at local pharmacy or Health Dept. Verbalized acceptance and understanding.  Screening Tests Health Maintenance  Topic Date Due   Zoster Vaccines- Shingrix (1 of 2) Never done   COVID-19 Vaccine (3 - Moderna risk series) 02/02/2020   OPHTHALMOLOGY EXAM  05/27/2020   HEMOGLOBIN A1C  10/04/2021   FOOT EXAM  04/05/2022   MAMMOGRAM  08/18/2022   PAP SMEAR-Modifier  01/28/2023   COLONOSCOPY (Pts 45-5yr Insurance coverage will need to be confirmed)  11/30/2025   TETANUS/TDAP  02/19/2029   Pneumococcal Vaccine 165633Years old  Completed   INFLUENZA VACCINE  Completed   Hepatitis C Screening  Completed   HIV Screening  Completed   HPV VACCINES  Aged Out    Health Maintenance  Health Maintenance Due  Topic Date Due   Zoster Vaccines-  Shingrix (1 of 2) Never done   COVID-19 Vaccine (3 - Moderna risk series) 02/02/2020   OPHTHALMOLOGY EXAM  05/27/2020    Colorectal cancer screening: Type of screening: Colonoscopy. Completed 11/18/2020. Repeat every 5 years  Mammogram status: Completed 08/17/2020. Repeat every year  Lung Cancer Screening: (Low Dose CT Chest recommended if Age 56-80years, 30 pack-year currently smoking OR have quit w/in 15years.) does not qualify.   Lung Cancer Screening Referral: no  Additional Screening:  Hepatitis C Screening: does qualify; Completed yes  Vision Screening: Recommended annual ophthalmology exams for early detection of glaucoma and other disorders of the eye. Is the patient up to date with their annual eye exam?  Yes  Who is the provider or what is the [facility] of the [facility] which the patient [facility] If pt is not established with a provider, would they like to be referred to a provider to establish care? No .   Dental Screening: Recommended annual dental exams for proper oral hygiene  Community Resource Referral / Chronic Care Management: CRR required this visit?  No   CCM required this visit?  No      Plan:     I have personally reviewed and noted the following in the patient's chart:   Medical and social history Use of alcohol, tobacco or illicit drugs  Current medications and supplements including opioid prescriptions.  Functional ability and status Nutritional status Physical activity Advanced directives List of other physicians Hospitalizations, surgeries, and ER visits in previous 12 months Vitals Screenings to include cognitive, depression, and falls Referrals and appointments  In addition, I have reviewed and discussed  with patient certain preventive protocols, quality metrics, and best practice recommendations. A written personalized care plan for preventive services as well as general preventive health  recommendations were provided to patient.     Sheral Flow, LPN   03/49/1791   Nurse Notes:  Hearing Screening - Comments:: Patient denied any hearing difficulty. No hearing aids. Vision Screening - Comments:: Patient wears glasses.  Next eye exam scheduled for 04/2021 at [facility].

## 2021-04-24 NOTE — Patient Instructions (Signed)
Ms. Saltzman , Thank you for taking time to come for your Medicare Wellness Visit. I appreciate your ongoing commitment to your health goals. Please review the following plan we discussed and let me know if I can assist you in the future.   Screening recommendations/referrals: Colonoscopy: 11/18/2020; due every 5 years Mammogram: 08/17/2020; due every 1-2 years Bone Density: never done Recommended yearly ophthalmology/optometry visit for glaucoma screening and checkup Recommended yearly dental visit for hygiene and checkup  Vaccinations: Influenza vaccine: 04/05/2021 Pneumococcal vaccine: 04/06/2015, 04/05/2021 Tdap vaccine: 02/20/2019 due every 10 years Shingles vaccine: never done  Covid-19: 12/10/2019, 01/05/2020  Advanced directives: Yes; documents provided to patient.  Conditions/risks identified: Yes; Client understands the importance of follow-up with providers by attending scheduled visits and discussed goals to eat healthier, increase physical activity, exercise the brain, socialize more, get enough sleep and make time for laughter.  Next appointment: Please schedule your next Medicare Wellness Visit with your Nurse Health Advisor in 1 year by calling [phone].  Preventive Care 40-64 Years, Female Preventive care refers to lifestyle choices and visits with your health care provider that can promote health and wellness. What does preventive care include? A yearly physical exam. This is also called an annual well check. Dental exams once or twice a year. Routine eye exams. Ask your health care provider how often you should have your eyes checked. Personal lifestyle choices, including: Daily care of your teeth and gums. Regular physical activity. Eating a healthy diet. Avoiding tobacco and drug use. Limiting alcohol use. Practicing safe sex. Taking low-dose aspirin daily starting at age 39. Taking vitamin and mineral supplements as recommended by your health care provider. What  happens during an annual well check? The services and screenings done by your health care provider during your annual well check will depend on your age, overall health, lifestyle risk factors, and family history of disease. Counseling  Your health care provider may ask you questions about your: Alcohol use. Tobacco use. Drug use. Emotional well-being. Home and relationship well-being. Sexual activity. Eating habits. Work and work Statistician. Method of birth control. Menstrual cycle. Pregnancy history. Screening  You may have the following tests or measurements: Height, weight, and BMI. Blood pressure. Lipid and cholesterol levels. These may be checked every 5 years, or more frequently if you are over 69 years old. Skin check. Lung cancer screening. You may have this screening every year starting at age 34 if you have a 30-pack-year history of smoking and currently smoke or have quit within the past 15 years. Fecal occult blood test (FOBT) of the stool. You may have this test every year starting at age 28. Flexible sigmoidoscopy or colonoscopy. You may have a sigmoidoscopy every 5 years or a colonoscopy every 10 years starting at age 85. Hepatitis C blood test. Hepatitis B blood test. Sexually transmitted disease (STD) testing. Diabetes screening. This is done by checking your blood sugar (glucose) after you have not eaten for a while (fasting). You may have this done every 1-3 years. Mammogram. This may be done every 1-2 years. Talk to your health care provider about when you should start having regular mammograms. This may depend on whether you have a family history of breast cancer. BRCA-related cancer screening. This may be done if you have a family history of breast, ovarian, tubal, or peritoneal cancers. Pelvic exam and Pap test. This may be done every 3 years starting at age 18. Starting at age 28, this may be done every 5 years if  you have a Pap test in combination with an HPV  test. Bone density scan. This is done to screen for osteoporosis. You may have this scan if you are at high risk for osteoporosis. Discuss your test results, treatment options, and if necessary, the need for more tests with your health care provider. Vaccines  Your health care provider may recommend certain vaccines, such as: Influenza vaccine. This is recommended every year. Tetanus, diphtheria, and acellular pertussis (Tdap, Td) vaccine. You may need a Td booster every 10 years. Zoster vaccine. You may need this after age 41. Pneumococcal 13-valent conjugate (PCV13) vaccine. You may need this if you have certain conditions and were not previously vaccinated. Pneumococcal polysaccharide (PPSV23) vaccine. You may need one or two doses if you smoke cigarettes or if you have certain conditions. Talk to your health care provider about which screenings and vaccines you need and how often you need them. This information is not intended to replace advice given to you by your health care provider. Make sure you discuss any questions you have with your health care provider. Document Released: 06/24/2015 Document Revised: 02/15/2016 Document Reviewed: 03/29/2015 Elsevier Interactive Patient Education  2017 [place] Prevention in the Home Falls can cause injuries. They can happen to people of all ages. There are many things you can do to make your home safe and to help prevent falls. What can I do on the outside of my home? Regularly fix the edges of walkways and driveways and fix any cracks. Remove anything that might make you trip as you walk through a door, such as a raised step or threshold. Trim any bushes or trees on the path to your home. Use bright outdoor lighting. Clear any walking paths of anything that might make someone trip, such as rocks or tools. Regularly check to see if handrails are loose or broken. Make sure that both sides of any steps have handrails. Any raised  decks and porches should have guardrails on the edges. Have any leaves, snow, or ice cleared regularly. Use sand or salt on walking paths during winter. Clean up any spills in your garage right away. This includes oil or grease spills. What can I do in the bathroom? Use night lights. Install grab bars by the toilet and in the tub and shower. Do not use towel bars as grab bars. Use non-skid mats or decals in the tub or shower. If you need to sit down in the shower, use a plastic, non-slip stool. Keep the floor dry. Clean up any water that spills on the floor as soon as it happens. Remove soap buildup in the tub or shower regularly. Attach bath mats securely with double-sided non-slip rug tape. Do not have throw rugs and other things on the floor that can make you trip. What can I do in the bedroom? Use night lights. Make sure that you have a light by your bed that is easy to reach. Do not use any sheets or blankets that are too big for your bed. They should not hang down onto the floor. Have a firm chair that has side arms. You can use this for support while you get dressed. Do not have throw rugs and other things on the floor that can make you trip. What can I do in the kitchen? Clean up any spills right away. Avoid walking on wet floors. Keep items that you use a lot in easy-to-reach places. If you need to reach something above  you, use a strong step stool that has a grab bar. Keep electrical cords out of the way. Do not use floor polish or wax that makes floors slippery. If you must use wax, use non-skid floor wax. Do not have throw rugs and other things on the floor that can make you trip. What can I do with my stairs? Do not leave any items on the stairs. Make sure that there are handrails on both sides of the stairs and use them. Fix handrails that are broken or loose. Make sure that handrails are as long as the stairways. Check any carpeting to make sure that it is firmly attached  to the stairs. Fix any carpet that is loose or worn. Avoid having throw rugs at the top or bottom of the stairs. If you do have throw rugs, attach them to the floor with carpet tape. Make sure that you have a light switch at the top of the stairs and the bottom of the stairs. If you do not have them, ask someone to add them for you. What else can I do to help prevent falls? Wear shoes that: Do not have high heels. Have rubber bottoms. Are comfortable and fit you well. Are closed at the toe. Do not wear sandals. If you use a stepladder: Make sure that it is fully opened. Do not climb a closed stepladder. Make sure that both sides of the stepladder are locked into place. Ask someone to hold it for you, if possible. Clearly mark and make sure that you can see: Any grab bars or handrails. First and last steps. Where the edge of each step is. Use tools that help you move around (mobility aids) if they are needed. These include: Canes. Walkers. Scooters. Crutches. Turn on the lights when you go into a dark area. Replace any light bulbs as soon as they burn out. Set up your furniture so you have a clear path. Avoid moving your furniture around. If any of your floors are uneven, fix them. If there are any pets around you, be aware of where they are. Review your medicines with your doctor. Some medicines can make you feel dizzy. This can increase your chance of falling. Ask your doctor what other things that you can do to help prevent falls. This information is not intended to replace advice given to you by your health care provider. Make sure you discuss any questions you have with your health care provider. Document Released: 03/24/2009 Document Revised: 11/03/2015 Document Reviewed: 07/02/2014 Elsevier Interactive Patient Education  2017 [employer].

## 2021-04-25 ENCOUNTER — Ambulatory Visit: Payer: Medicare HMO

## 2021-05-01 DIAGNOSIS — E119 Type 2 diabetes mellitus without complications: Secondary | ICD-10-CM | POA: Diagnosis not present

## 2021-05-01 LAB — HM DIABETES EYE EXAM

## 2021-05-16 DIAGNOSIS — H5213 Myopia, bilateral: Secondary | ICD-10-CM | POA: Diagnosis not present

## 2021-05-16 DIAGNOSIS — H52209 Unspecified astigmatism, unspecified eye: Secondary | ICD-10-CM | POA: Diagnosis not present

## 2021-05-16 DIAGNOSIS — H524 Presbyopia: Secondary | ICD-10-CM | POA: Diagnosis not present

## 2021-05-23 ENCOUNTER — Other Ambulatory Visit: Payer: Self-pay | Admitting: Internal Medicine

## 2021-05-23 ENCOUNTER — Telehealth: Payer: Medicare HMO | Admitting: Physician Assistant

## 2021-05-23 ENCOUNTER — Encounter: Payer: Self-pay | Admitting: Internal Medicine

## 2021-05-23 DIAGNOSIS — U071 COVID-19: Secondary | ICD-10-CM

## 2021-05-23 MED ORDER — MOLNUPIRAVIR EUA 200MG CAPSULE: 4.0000 | ORAL_CAPSULE | Freq: Two times a day (BID) | ORAL | 0 refills | Status: AC

## 2021-05-23 MED ORDER — BENZONATATE 100 MG PO CAPS: 100.0000 mg | ORAL_CAPSULE | Freq: Three times a day (TID) | ORAL | 0 refills | Status: DC | PRN

## 2021-05-23 NOTE — Progress Notes (Signed)
I have spent 5 minutes in review of e-visit questionnaire, review and updating patient chart, medical decision making and response to patient.   Lyan Moyano Cody Jayln Branscom, PA-C    

## 2021-05-23 NOTE — Progress Notes (Signed)

## 2021-06-19 DIAGNOSIS — F419 Anxiety disorder, unspecified: Secondary | ICD-10-CM | POA: Diagnosis not present

## 2021-06-19 DIAGNOSIS — M48061 Spinal stenosis, lumbar region without neurogenic claudication: Secondary | ICD-10-CM | POA: Diagnosis not present

## 2021-06-19 DIAGNOSIS — M461 Sacroiliitis, not elsewhere classified: Secondary | ICD-10-CM | POA: Diagnosis not present

## 2021-06-19 DIAGNOSIS — F1120 Opioid dependence, uncomplicated: Secondary | ICD-10-CM | POA: Diagnosis not present

## 2021-07-27 ENCOUNTER — Other Ambulatory Visit: Payer: Self-pay

## 2021-07-27 ENCOUNTER — Encounter (HOSPITAL_COMMUNITY): Payer: Self-pay | Admitting: Psychiatry

## 2021-07-27 ENCOUNTER — Telehealth (HOSPITAL_BASED_OUTPATIENT_CLINIC_OR_DEPARTMENT_OTHER): Payer: Medicare HMO | Admitting: Psychiatry

## 2021-07-27 DIAGNOSIS — F333 Major depressive disorder, recurrent, severe with psychotic symptoms: Secondary | ICD-10-CM | POA: Diagnosis not present

## 2021-07-27 DIAGNOSIS — F4310 Post-traumatic stress disorder, unspecified: Secondary | ICD-10-CM

## 2021-07-27 DIAGNOSIS — G4701 Insomnia due to medical condition: Secondary | ICD-10-CM | POA: Diagnosis not present

## 2021-07-27 DIAGNOSIS — F411 Generalized anxiety disorder: Secondary | ICD-10-CM

## 2021-07-27 MED ORDER — MIRTAZAPINE 45 MG PO TABS: 45.0000 mg | ORAL_TABLET | Freq: Every day | ORAL | 0 refills | Status: DC

## 2021-07-27 MED ORDER — LAMOTRIGINE 25 MG PO TABS: ORAL_TABLET | ORAL | 0 refills | Status: DC

## 2021-07-27 MED ORDER — HYDROXYZINE HCL 50 MG PO TABS: 50.0000 mg | ORAL_TABLET | Freq: Three times a day (TID) | ORAL | 0 refills | Status: DC

## 2021-07-27 NOTE — Progress Notes (Signed)
Virtual Visit via Telephone Note  I connected with Melody Jenkins on 07/27/21 at 10:45 AM EST by telephone and verified that I am speaking with the correct person using two identifiers.  Location: Patient: home Provider: office   I discussed the limitations, risks, security and privacy concerns of performing an evaluation and management service by telephone and the availability of in person appointments. I also discussed with the patient that there may be a patient responsible charge related to this service. The patient expressed understanding and agreed to proceed.   History of Present Illness: Melody Jenkins has had a rough few months. She has a number of house and car problems. Melody Jenkins has ongoing financial problems. She is still dealing with her health issues and family problems. It was overwhelming and at one point she wanted to give up. She snapped and said something that made the person she was talking to on the phone worry. It resulted in a welfare check from the police at the end of December. Melody Jenkins is feeling depressed and irritable on a daily basis. She is very easily frustrated by people and stress. Her sleep is fair but her energy is limited. Her appetite is poor most days. She is endorsing anhedonia, isolation and worthlessness. She denies SI/HI. She states any passive thoughts of death resolved when she got a car and things started to work out at home. Melody Jenkins anxiety level stays high and she struggles daily. She has ruminating, racing thoughts and palpitations. [name] has dealing with on/off intrusive memories and nightmares around the holidays.    Observations/Objective:  General Appearance: unable to assess  Eye Contact:  unable to assess  Speech:  Clear and Coherent and Normal Rate  Volume:  Normal  Mood:  Anxious and Depressed  Affect:  Congruent  Thought Process:  Coherent and Descriptions of Associations: Circumstantial  Orientation:  Full (Time, Place, and Person)   Thought Content:  Rumination and Tangential  Suicidal Thoughts:  No  Homicidal Thoughts:  No  Memory:  Immediate;   Good  Judgement:  Good  Insight:  Good  Psychomotor Activity: unable to assess  Concentration:  Concentration: Good  Recall:  Good  Fund of Knowledge:  Good  Language:  Good  Akathisia:  unable to assess  Handed:  unable to assess  AIMS (if indicated):     Assets:  Communication Skills Desire for Improvement Financial Resources/Insurance Housing Resilience Transportation Vocational/Educational  ADL's:  unable to assess  Cognition:  WNL  Sleep:        Assessment and Plan: Depression screen [facility] 2/9 07/27/2021 04/24/2021 10/27/2020 09/08/2020 08/17/2020  Decreased Interest 3 0 2 1 0  Down, Depressed, Hopeless 3 0 1 1 -  PHQ - 2 Score 6 0 3 2 0  Altered sleeping 0 - 3 3 0  Tired, decreased energy 3 - 2 3 0  Change in appetite 2 - 3 3 0  Feeling bad or failure about yourself  3 - 3 3 0  Trouble concentrating 3 - 2 3 0  Moving slowly or fidgety/restless 1 - 0 0 0  Suicidal thoughts 0 - 0 0 0  PHQ-9 Score 18 - 16 17 0  Difficult doing work/chores Extremely dIfficult - Very difficult Very difficult -  Some recent data might be hidden    Flowsheet Row Video Visit from 07/27/2021 in [facility] Video Visit from 10/27/2020 in [facility] Video Visit from 09/08/2020 in [facility]  C-SSRS RISK CATEGORY No Risk No Risk No Risk      - Kilani is looking for a new therapist who accepts medicare  - she does not want to go back on Lithium  -start Lamictal 25mg  po qD x 2 weeks then increase to 50mg  po qD   1. MDD (major depressive disorder), recurrent, severe, with psychosis ([place]) - mirtazapine (REMERON) 45 MG tablet; Take 1 tablet (45 mg total) by mouth at bedtime.  Dispense: 30 tablet; Refill: 0 - lamoTRIgine (LAMICTAL) 25 MG tablet; Take 1 tablet  (25 mg total) by mouth daily for 14 days, THEN 2 tablets (50 mg total) daily.  Dispense: 74 tablet; Refill: 0  2. GAD (generalized anxiety disorder) - hydrOXYzine (ATARAX) 50 MG tablet; Take 1 tablet (50 mg total) by mouth 3 (three) times daily.  Dispense: 90 tablet; Refill: 0 - mirtazapine (REMERON) 45 MG tablet; Take 1 tablet (45 mg total) by mouth at bedtime.  Dispense: 30 tablet; Refill: 0  3. PTSD (post-traumatic stress disorder) - mirtazapine (REMERON) 45 MG tablet; Take 1 tablet (45 mg total) by mouth at bedtime.  Dispense: 30 tablet; Refill: 0  4. Insomnia due to medical condition - hydrOXYzine (ATARAX) 50 MG tablet; Take 1 tablet (50 mg total) by mouth 3 (three) times daily.  Dispense: 90 tablet; Refill: 0 - mirtazapine (REMERON) 45 MG tablet; Take 1 tablet (45 mg total) by mouth at bedtime.  Dispense: 30 tablet; Refill: 0    Follow Up Instructions: In 2 weeks or sooner if needed   I discussed the assessment and treatment plan with the patient. The patient was provided an opportunity to ask questions and all were answered. The patient agreed with the plan and demonstrated an understanding of the instructions.   The patient was advised to call back or seek an in-person evaluation if the symptoms worsen or if the condition fails to improve as anticipated.  I provided 34 minutes of non-face-to-face time during this encounter.   Charlcie Cradle, MD

## 2021-08-10 ENCOUNTER — Other Ambulatory Visit: Payer: Self-pay

## 2021-08-10 ENCOUNTER — Telehealth (HOSPITAL_BASED_OUTPATIENT_CLINIC_OR_DEPARTMENT_OTHER): Payer: Medicare HMO | Admitting: Psychiatry

## 2021-08-10 ENCOUNTER — Encounter: Payer: Self-pay | Admitting: Internal Medicine

## 2021-08-10 DIAGNOSIS — F411 Generalized anxiety disorder: Secondary | ICD-10-CM | POA: Diagnosis not present

## 2021-08-10 DIAGNOSIS — F333 Major depressive disorder, recurrent, severe with psychotic symptoms: Secondary | ICD-10-CM | POA: Diagnosis not present

## 2021-08-10 NOTE — Progress Notes (Signed)
Virtual Visit via Telephone Note ? ?I connected with Melody Jenkins on 08/10/21 at 11:00 AM EST by telephone and verified that I am speaking with the correct person using two identifiers. ? ?Location: ?Patient: home ?Provider: office ?  ?I discussed the limitations, risks, security and privacy concerns of performing an evaluation and management service by telephone and the availability of in person appointments. I also discussed with the patient that there may be a patient responsible charge related to this service. The patient expressed understanding and agreed to proceed. ? ? ?History of Present Illness: ?Melody Jenkins has been having diarrhea and vomiting for the last 2 days. Her friends and family have been very concerned. She is planning on talking to her PCP on what to do. Due to this she has not taken any meds for the last 2 days. She started Lamictal on Sunday and only took it for 3 days before this happened. She is wondering if she is sick or if this is due to Lamictal. Her depression and anxiety are unchanged. She denies SI/HI.  ?  ?Observations/Objective: ? ?General Appearance: unable to assess  ?Eye Contact:  unable to assess  ?Speech:  Clear and Coherent and Normal Rate  ?Volume:  Normal  ?Mood:  Anxious and Depressed  ?Affect:  Congruent  ?Thought Process:  Coherent and Descriptions of Associations: Circumstantial  ?Orientation:  Full (Time, Place, and Person)  ?Thought Content:  Rumination  ?Suicidal Thoughts:  No  ?Homicidal Thoughts:  No  ?Memory:  Immediate;   Good  ?Judgement:  Good  ?Insight:  Good  ?Psychomotor Activity: unable to assess  ?Concentration:  Concentration: Good  ?Recall:  Good  ?Fund of Knowledge:  Good  ?Language:  Good  ?Akathisia:  unable to assess  ?Handed:  unable to assess  ?AIMS (if indicated):     ?Assets:  Communication Skills ?Desire for Improvement ?Financial Resources/Insurance ?Housing ?Resilience ?Social Support ?Talents/Skills ?Transportation ?Vocational/Educational   ?ADL's:  unable to assess  ?Cognition:  WNL  ?Sleep:     ? ? ? ?Assessment and Plan: ?I advised her to speak with her PCP regarding her severe diarrhea and vomiting. If PCP thinks it is due to Lamictal then Melody Jenkins will stop it. If not I advised her to wait to start it again until all her GI symptoms have resolved. ? ? ?1. MDD (major depressive disorder), recurrent, severe, with psychosis ([place]) ? ?2. GAD (generalized anxiety disorder) ? ? ? ?Follow Up Instructions: ?In 1-2 weeks or sooner if needed ?  ?I discussed the assessment and treatment plan with the patient. The patient was provided an opportunity to ask questions and all were answered. The patient agreed with the plan and demonstrated an understanding of the instructions. ?  ?The patient was advised to call back or seek an in-person evaluation if the symptoms worsen or if the condition fails to improve as anticipated. ? ?I provided 7 minutes of non-face-to-face time during this encounter. ? ? ?Charlcie Cradle, MD ? ?

## 2021-08-24 ENCOUNTER — Other Ambulatory Visit: Payer: Self-pay

## 2021-08-24 ENCOUNTER — Telehealth (HOSPITAL_BASED_OUTPATIENT_CLINIC_OR_DEPARTMENT_OTHER): Payer: Medicare HMO | Admitting: Psychiatry

## 2021-08-24 DIAGNOSIS — G4701 Insomnia due to medical condition: Secondary | ICD-10-CM

## 2021-08-24 DIAGNOSIS — F333 Major depressive disorder, recurrent, severe with psychotic symptoms: Secondary | ICD-10-CM

## 2021-08-24 DIAGNOSIS — F411 Generalized anxiety disorder: Secondary | ICD-10-CM | POA: Diagnosis not present

## 2021-08-24 NOTE — Progress Notes (Signed)
Virtual Visit via Video Note ? ?I connected with Melody Jenkins on 08/24/21 at 10:00 AM EDT by a video enabled telemedicine application and verified that I am speaking with the correct person using two identifiers. ? ?Location: ?Patient: home ?Provider: office ?  ?I discussed the limitations of evaluation and management by telemedicine and the availability of in person appointments. The patient expressed understanding and agreed to proceed. ? ?History of Present Illness: ?Melody Jenkins shares that her illness improved 2 days after we talked. She had called her PCP who advised her to go to the ED. Melody Jenkins did not feel it was going to help so she didn't go. Melody Jenkins is feeling better. She is not sure if it was a stomach virus or the Lamictal. She has not taken any more Lamictal since then. She does not want to try it. Melody Jenkins falls asleep after taking her meds. She has been sleeping in the living room but wants to move back to sleeping in her bedroom so that her sleep can improve. December was a bad month and she was very depressed. Over the last 1 week her depression is more manageable. She has a couple of days where she was sad, overwhelmed and crying. Melody Jenkins really wants to start dating again. Melody Jenkins denies isolation and has bene visiting friends on the weekend to help the loneliness. She thinks moving might be helpful but hasn't made any specific plans yet. Melody Jenkins appetite is on the poor side and she is eating very small meals or snacks. Melody Jenkins denies SI/HI. She admits to feeling hopeless at times and having fleeting thoughts of wishing she was dead a couple of days last week. Melody Jenkins continues to have significant anxiety regarding her health and finances. Her pain has been worse lately and she thinks it is due to the tempeture. Spinal surgery may help to reduce the pain but she doesn's think she can handle the difficult recovery emotionally. She would be alone a lot and doesn't have anyone she could rely on  for support during the recovery period.   ?  ?Observations/Objective: ?Psychiatric Specialty Exam: ?ROS  ?Last menstrual period 12/17/2017.There is no height or weight on file to calculate BMI.  ?General Appearance: Neat and Well Groomed  ?Eye Contact:  Fair  ?Speech:  Clear and Coherent and Normal Rate  ?Volume:  Normal  ?Mood:  Anxious and Depressed  ?Affect:  Congruent  ?Thought Process:  Coherent and Descriptions of Associations: Circumstantial  ?Orientation:  Full (Time, Place, and Person)  ?Thought Content:  Logical  ?Suicidal Thoughts:  No  ?Homicidal Thoughts:  No  ?Memory:  Immediate;   Good  ?Judgement:  Good  ?Insight:  Good  ?Psychomotor Activity:  Normal  ?Concentration:  Concentration: Good  ?Recall:  Good  ?Fund of Knowledge:  Good  ?Language:  Good  ?Akathisia:  No  ?Handed:  Right  ?AIMS (if indicated):     ?Assets:  Communication Skills ?Desire for Improvement ?Financial Resources/Insurance ?Housing ?Resilience ?Social Support ?Talents/Skills ?Transportation ?Vocational/Educational  ?ADL's:  Intact  ?Cognition:  WNL  ?Sleep:     ? ? ? ?Assessment and Plan: ? ?Depression screen [facility] 2/9 08/24/2021 07/27/2021 04/24/2021 10/27/2020 09/08/2020  ?Decreased Interest 0 3 0 2 1  ?Down, Depressed, Hopeless 1 3 0 1 1  ?PHQ - 2 Score 1 6 0 3 2  ?Altered sleeping 2 0 - 3 3  ?Tired, decreased energy 2 3 - 2 3  ?Change in appetite 3 2 - 3 3  ?Feeling bad  or failure about yourself  3 3 - 3 3  ?Trouble concentrating 3 3 - 2 3  ?Moving slowly or fidgety/restless 0 1 - 0 0  ?Suicidal thoughts 1 0 - 0 0  ?PHQ-9 Score 15 18 - 16 17  ?Difficult doing work/chores Very difficult Extremely dIfficult - Very difficult Very difficult  ?Some recent data might be hidden  ? ? ?Flowsheet Row Video Visit from 08/24/2021 in [facility] Video Visit from 07/27/2021 in [facility] Video Visit from 10/27/2020 in [facility]   ?C-SSRS RISK CATEGORY Error: Q3, 4, or 5 should not be populated when Q2 is No No Risk No Risk  ? ?  ? ?D/c Lamictal ? ?Continue Remeron, Hydroxyzine  ? ?Annetta does not want to change her meds at this time.  ? ?She is going to try to become more active and social by joining the [facility] [employer] or other such places to see if that will improve her mood ? ?1. MDD (major depressive disorder), recurrent, severe, with psychosis ([place]) ? ?2. GAD (generalized anxiety disorder) ? ?3. Insomnia due to medical condition ? ? ?Follow Up Instructions: ?In 2-3 months or sooner if needed ?  ?I discussed the assessment and treatment plan with the patient. The patient was provided an opportunity to ask questions and all were answered. The patient agreed with the plan and demonstrated an understanding of the instructions. ?  ?The patient was advised to call back or seek an in-person evaluation if the symptoms worsen or if the condition fails to improve as anticipated. ? ?I provided 25 minutes of non-face-to-face time during this encounter. ? ? ?Charlcie Cradle, MD ? ?

## 2021-08-25 ENCOUNTER — Other Ambulatory Visit: Payer: Self-pay | Admitting: Internal Medicine

## 2021-08-25 DIAGNOSIS — I10 Essential (primary) hypertension: Secondary | ICD-10-CM

## 2021-09-06 ENCOUNTER — Other Ambulatory Visit: Payer: Self-pay | Admitting: Internal Medicine

## 2021-09-06 DIAGNOSIS — E1169 Type 2 diabetes mellitus with other specified complication: Secondary | ICD-10-CM

## 2021-09-25 ENCOUNTER — Other Ambulatory Visit: Payer: Self-pay | Admitting: Internal Medicine

## 2021-09-25 DIAGNOSIS — E785 Hyperlipidemia, unspecified: Secondary | ICD-10-CM

## 2021-10-04 ENCOUNTER — Encounter: Payer: Self-pay | Admitting: Internal Medicine

## 2021-10-04 ENCOUNTER — Ambulatory Visit (INDEPENDENT_AMBULATORY_CARE_PROVIDER_SITE_OTHER): Payer: Medicare HMO | Admitting: Internal Medicine

## 2021-10-04 ENCOUNTER — Other Ambulatory Visit: Payer: Self-pay | Admitting: Internal Medicine

## 2021-10-04 VITALS — BP 126/78 | HR 84 | Temp 98.0°F | Ht 62.0 in | Wt 210.0 lb

## 2021-10-04 DIAGNOSIS — E876 Hypokalemia: Secondary | ICD-10-CM | POA: Diagnosis not present

## 2021-10-04 DIAGNOSIS — J4530 Mild persistent asthma, uncomplicated: Secondary | ICD-10-CM

## 2021-10-04 DIAGNOSIS — I10 Essential (primary) hypertension: Secondary | ICD-10-CM

## 2021-10-04 DIAGNOSIS — E1169 Type 2 diabetes mellitus with other specified complication: Secondary | ICD-10-CM

## 2021-10-04 DIAGNOSIS — Z1231 Encounter for screening mammogram for malignant neoplasm of breast: Secondary | ICD-10-CM

## 2021-10-04 DIAGNOSIS — E785 Hyperlipidemia, unspecified: Secondary | ICD-10-CM | POA: Diagnosis not present

## 2021-10-04 DIAGNOSIS — T502X5A Adverse effect of carbonic-anhydrase inhibitors, benzothiadiazides and other diuretics, initial encounter: Secondary | ICD-10-CM

## 2021-10-04 LAB — BASIC METABOLIC PANEL
BUN: 17 mg/dL (ref 6–23)
CO2: 35 mEq/L — ABNORMAL HIGH (ref 19–32)
Calcium: 9.7 mg/dL (ref 8.4–10.5)
Chloride: 99 mEq/L (ref 96–112)
Creatinine, Ser: 0.89 mg/dL (ref 0.40–1.20)
GFR: 72.36 mL/min (ref >60.00)
Glucose, Bld: 111 mg/dL — ABNORMAL HIGH (ref 70–99)
Potassium: 3.2 mEq/L — ABNORMAL LOW (ref 3.5–5.1)
Sodium: 141 mEq/L (ref 135–145)

## 2021-10-04 LAB — HEMOGLOBIN A1C: Hgb A1c MFr Bld: 6 % (ref 4.6–6.5)

## 2021-10-04 MED ORDER — POTASSIUM CHLORIDE ER 10 MEQ PO TBCR: 10.0000 meq | EXTENDED_RELEASE_TABLET | Freq: Three times a day (TID) | ORAL | 0 refills | Status: DC

## 2021-10-04 MED ORDER — TRELEGY ELLIPTA 200-62.5-25 MCG/ACT IN AEPB: 1.0000 | INHALATION_SPRAY | Freq: Every day | RESPIRATORY_TRACT | 1 refills | Status: DC

## 2021-10-04 NOTE — Progress Notes (Signed)
? ?Subjective:  ?Patient ID: Melody Jenkins, female    DOB: 06/10/1966  Age: 56 y.o. MRN: [id number] ? ?CC: Diabetes, Asthma, and Hypertension ? ? ?HPI ?Melody Jenkins presents for f/up - ? ?She complains of a 6 week hx of wheezing. She has been using a SABA. She has a cough that is productive of clear phlegm. ? ?Outpatient Medications Prior to Visit  ?Medication Sig Dispense Refill  ? Biotin 5000 MCG CAPS Take 1 capsule by mouth.    ? Blood Glucose Monitoring Suppl (BLOOD GLUCOSE SYSTEM PAK) KIT Please dispense based on patient and insurance preference. Use as directed to monitor FSBS 2x daily. Dx: E11.9 1 kit 1  ? Cholecalciferol (VITAMIN D3) 25 MCG (1000 UT) CAPS Take by mouth 2 (two) times daily.    ? cloNIDine (CATAPRES) 0.2 MG tablet TAKE 1 TABLET TWICE DAILY 180 tablet 0  ? Cyanocobalamin (VITAMIN B 12 PO) Take 1 tablet by mouth daily.     ? gabapentin (NEURONTIN) 300 MG capsule Take 600 mg by mouth 3 (three) times daily.    ? Glucose Blood (BLOOD GLUCOSE TEST STRIPS) STRP Please dispense based on patient and insurance preference. Use as directed to monitor FSBS 2x daily. Dx: E11.9 200 strip 1  ? hydrocortisone 2.5 % ointment Apply topically 2 (two) times daily. To affected areas on face 60 g 2  ? hydrOXYzine (ATARAX) 50 MG tablet Take 1 tablet (50 mg total) by mouth 3 (three) times daily. 90 tablet 0  ? indapamide (LOZOL) 1.25 MG tablet TAKE 1 TABLET EVERY DAY 90 tablet 0  ? Lancets MISC Please dispense based on patient and insurance preference. Use as directed to monitor FSBS 2x daily. Dx: E11.9 200 each 1  ? meloxicam (MOBIC) 7.5 MG tablet Take 7.5 mg by mouth 2 (two) times daily.    ? metoprolol succinate (TOPROL-XL) 100 MG 24 hr tablet TAKE 1 TABLET EVERY DAY WITH OR IMMEDIATELY FOLLOWING A MEAL 90 tablet 0  ? mirtazapine (REMERON) 45 MG tablet Take 1 tablet (45 mg total) by mouth at bedtime. 30 tablet 0  ? Plecanatide (TRULANCE) 3 MG TABS Take 3 mg by mouth daily. Lot: [id number], EXP: 05-2022 9  tablet 0  ? polyethylene glycol (MIRALAX) 17 g packet Take 17 g by mouth 2 (two) times daily. 14 each 0  ? pramoxine-hydrocortisone (PROCTOCREAM-HC) 1-1 % rectal cream Apply a pea-sized amount rectally 2 to 3 times a day 30 g 1  ? rosuvastatin (CRESTOR) 10 MG tablet TAKE 1 TABLET EVERY DAY 90 tablet 1  ? TRULICITY 0.07 HQ/1.9XJ SOPN INJECT 0.75MG (1 PEN) UNDER THE SKIN EVERY WEEK 6 mL 1  ? potassium chloride (KLOR-CON 10) 10 MEQ tablet Take 1 tablet (10 mEq total) by mouth 3 (three) times daily. 270 tablet 0  ? ?No facility-administered medications prior to visit.  ? ? ?ROS ?Review of Systems  ?Constitutional:  Negative for diaphoresis and fatigue.  ?HENT: Negative.    ?Eyes: Negative.   ?Respiratory:  Positive for cough and wheezing. Negative for chest tightness and shortness of breath.   ?Cardiovascular:  Negative for chest pain, palpitations and leg swelling.  ?Gastrointestinal:  Negative for abdominal pain, constipation, diarrhea, nausea and vomiting.  ?Endocrine: Negative.   ?Genitourinary: Negative.  Negative for difficulty urinating.  ?Musculoskeletal:  Negative for arthralgias, back pain, myalgias and neck pain.  ?Skin: Negative.  Negative for rash.  ?Neurological:  Negative for dizziness, weakness, light-headedness and headaches.  ?Hematological:  Negative for adenopathy. Does  not bruise/bleed easily.  ?Psychiatric/Behavioral: Negative.    ? ?Objective:  ?BP 126/78 (BP Location: Left Arm, Patient Position: Sitting, Cuff Size: Large)   Pulse 84   Temp 98 ?F (36.7 ?C) (Oral)   Ht _0  (1.575 m)   Wt 210 lb (95.3 kg)   LMP 12/17/2017   SpO2 97%   BMI 38.41 kg/m?  ? ?BP Readings from Last 3 Encounters:  ?10/04/21 126/78  ?04/24/21 122/70  ?04/05/21 128/84  ? ? ?Wt Readings from Last 3 Encounters:  ?10/04/21 210 lb (95.3 kg)  ?04/24/21 218 lb 3.2 oz (99 kg)  ?04/05/21 219 lb (99.3 kg)  ? ? ?Physical Exam ?Vitals reviewed.  ?Constitutional:   ?   General: She is not in acute distress. ?   Appearance:  Normal appearance. She is not ill-appearing, toxic-appearing or diaphoretic.  ?HENT:  ?   Mouth/Throat:  ?   Mouth: Mucous membranes are moist.  ?Eyes:  ?   General: No scleral icterus. ?   Conjunctiva/sclera: Conjunctivae normal.  ?Cardiovascular:  ?   Rate and Rhythm: Normal rate and regular rhythm.  ?   Heart sounds: No murmur heard. ?Pulmonary:  ?   Effort: Pulmonary effort is normal.  ?   Breath sounds: No stridor. No wheezing, rhonchi or rales.  ?Abdominal:  ?   General: Abdomen is flat.  ?   Palpations: There is no mass.  ?   Tenderness: There is no abdominal tenderness. There is no guarding.  ?   Hernia: No hernia is present.  ?Musculoskeletal:     ?   General: Normal range of motion.  ?   Cervical back: Neck supple.  ?   Right lower leg: No edema.  ?   Left lower leg: No edema.  ?Lymphadenopathy:  ?   Cervical: No cervical adenopathy.  ?Skin: ?   General: Skin is warm and dry.  ?Neurological:  ?   General: No focal deficit present.  ?   Mental Status: She is alert.  ?Psychiatric:     ?   Mood and Affect: Mood normal.     ?   Behavior: Behavior normal.  ? ? ?Lab Results  ?Component Value Date  ? WBC 6.8 08/17/2020  ? HGB 14.3 08/17/2020  ? HCT 43.9 08/17/2020  ? PLT 206.0 08/17/2020  ? GLUCOSE 111 (H) 10/04/2021  ? CHOL 243 (H) 04/05/2021  ? TRIG 146.0 04/05/2021  ? HDL 37.80 (L) 04/05/2021  ? LDLDIRECT 87 05/27/2019  ? LDLCALC 176 (H) 04/05/2021  ? ALT 14 05/16/2020  ? AST 11 05/16/2020  ? NA 141 10/04/2021  ? K 3.2 (L) 10/04/2021  ? CL 99 10/04/2021  ? CREATININE 0.89 10/04/2021  ? BUN 17 10/04/2021  ? CO2 35 (H) 10/04/2021  ? TSH 0.74 08/17/2020  ? HGBA1C 6.0 10/04/2021  ? MICROALBUR 1.0 04/05/2021  ? ? ?CT CARDIAC SCORING (SELF PAY ONLY) ? ?Addendum Date: 10/07/2020   ?ADDENDUM REPORT: 10/07/2020 18:49 CLINICAL DATA:  Cardiovascular Disease Risk stratification EXAM: Coronary Calcium Score TECHNIQUE: A gated, non-contrast computed tomography scan of the heart was performed using 40m slice thickness. Axial  images were analyzed on a dedicated workstation. Calcium scoring of the coronary arteries was performed using the Agatston method. FINDINGS: Coronary Calcium Score: Left main: 0 Left anterior descending artery: 0 Left circumflex artery: 0 Right coronary artery: 0 Total: 0 Percentile: NA Pericardium: Normal. Ascending Aorta: Normal caliber. [facility]-cardiac: [facility]. IMPRESSION: Coronary calcium score of 0. RECOMMENDATIONS:  Coronary artery calcium (CAC) score is a strong predictor of incident coronary heart disease (CHD) and provides predictive information beyond traditional risk factors. CAC scoring is reasonable to use in the decision to withhold, postpone, or initiate statin therapy in intermediate-risk or selected borderline-risk asymptomatic adults (age 93-75 years and LDL-C >=70 to <190 mg/dL) who do not have diabetes or established atherosclerotic cardiovascular disease (ASCVD).* In intermediate-risk (10-year ASCVD risk >=7.5% to <20%) adults or selected borderline-risk (10-year ASCVD risk >=5% to <7.5%) adults in whom a CAC score is measured for the purpose of making a treatment decision the following recommendations have been made: If CAC=0, it is reasonable to withhold statin therapy and reassess in 5 to 10 years, as long as higher risk conditions are absent (diabetes mellitus, family history of premature CHD in first degree relatives (males <55 years; females <65 years), cigarette smoking, or LDL >=190 mg/dL). If CAC is 1 to 99, it is reasonable to initiate statin therapy for patients >=104 years of age. If CAC is >=100 or >=75th percentile, it is reasonable to initiate statin therapy at any age. Cardiology referral should be considered for patients with CAC scores >=400 or >=75th percentile. *2018 AHA/ACC/AACVPR/AAPA/ABC/ACPM/ADA/AGS/APhA/ASPC/NLA/PCNA Guideline on the Management of Blood Cholesterol: A Report of the American College of Cardiology/American Heart Association  Task Force on Clinical Practice Guidelines. J Am Coll Cardiol. 2019;73(24):3168-3209. Eleonore Chiquito, MD Electronically Signed   By: Eleonore Chiquito   On: 10/07/2020 18:49  ? ?Result Date: 10/07/2020 ?EXAM: OVER-REA

## 2021-10-04 NOTE — Patient Instructions (Signed)
Asthma, Adult ? ?Asthma is a long-term (chronic) condition that causes recurrent episodes in which the lower airways in the lungs become tight and narrow. The narrowing is caused by inflammation and tightening of the smooth muscle around the lower airways. ?Asthma episodes, also called asthma attacks or asthma flares, may cause coughing, making high-pitched whistling sounds when you breathe, most often when you breathe out (wheezing), shortness of breath, and chest pain. The airways may produce extra mucus caused by the inflammation and irritation. During an attack, it can be difficult to breathe. Asthma attacks can range from minor to life-threatening. ?Asthma cannot be cured, but medicines and lifestyle changes can help control it and treat acute attacks. It is important to keep your asthma well controlled so the condition does not interfere with your daily life. ?What are the causes? ?This condition is believed to be caused by inherited (genetic) and environmental factors, but its exact cause is not known. ?What can trigger an asthma attack? ?Many things can bring on an asthma attack or make symptoms worse. These triggers are different for every person. Common triggers include: ?Allergens and irritants like mold, dust, pet dander, cockroaches, pollen, air pollution, and chemical odors. ?Cigarette smoke. ?Weather changes and cold air. ?Stress and strong emotional responses such as crying or laughing hard. ?Certain medications such as aspirin or beta blockers. ?Infections and inflammatory conditions, such as the flu, a cold, pneumonia, or inflammation of the nasal membranes (rhinitis). ?Gastroesophageal reflux disease (GERD). ?What are the signs or symptoms? ?Symptoms may occur right after exposure to an asthma trigger or hours later and can vary by person. Common signs and symptoms include: ?Wheezing. ?Trouble breathing (shortness of breath). ?Excessive nighttime or early morning coughing. ?Chest  tightness. ?Tiredness (fatigue) with minimal activity. ?Difficulty talking in complete sentences. ?Poor exercise tolerance. ?How is this diagnosed? ?This condition is diagnosed based on: ?A physical exam and your medical history. ?Tests, which may include: ?Lung function studies to evaluate the flow of air in your lungs. ?Allergy tests. ?Imaging tests, such as X-rays. ?How is this treated? ?There is no cure, but symptoms can be controlled with proper treatment. Treatment usually involves: ?Identifying and avoiding your asthma triggers. ?Inhaled medicines. Two types are commonly used to treat asthma, depending on severity: ?Controller medicines. These help prevent asthma symptoms from occurring. They are taken every day. ?Fast-acting reliever or rescue medicines. These quickly relieve asthma symptoms. They are used as needed and provide short-term relief. ?Using other medicines, such as: ?Allergy medicines, such as antihistamines, if your asthma attacks are triggered by allergens. ?Immune medicines (immunomodulators). These are medicines that help control the immune system. ?Using supplemental oxygen. This is only needed during a severe episode. ?Creating an asthma action plan. An asthma action plan is a written plan for managing and treating your asthma attacks. This plan includes: ?A list of your asthma triggers and how to avoid them. ?Information about when medicines should be taken and when their dosage should be changed. ?Instructions about using a device called a peak flow meter. A peak flow meter measures how well the lungs are working and the severity of your asthma. It helps you monitor your condition. ?Follow these instructions at home: ?Take over-the-counter and prescription medicines only as told by your health care provider. ?Stay up to date on all vaccinations as recommended by your healthcare provider, including vaccines for the flu and pneumonia. ?Use a peak flow meter and keep track of your peak flow  readings. ?Understand and use your asthma   action plan to address any asthma flares. ?Do not smoke or allow anyone to smoke in your home. ?Contact a health care provider if: ?You have wheezing, shortness of breath, or a cough that is not responding to medicines. ?Your medicines are causing side effects, such as a rash, itching, swelling, or trouble breathing. ?You need to use a reliever medicine more than 2-3 times a week. ?Your peak flow reading is still at 50-79% of your personal best after following your action plan for 1 hour. ?You have a fever and shortness of breath. ?Get help right away if: ?You are getting worse and do not respond to treatment during an asthma attack. ?You are short of breath when at rest or when doing very little physical activity. ?You have difficulty eating, drinking, or talking. ?You have chest pain or tightness. ?You develop a fast heartbeat or palpitations. ?You have a bluish color to your lips or fingernails. ?You are light-headed or dizzy, or you faint. ?Your peak flow reading is less than 50% of your personal best. ?You feel too tired to breathe normally. ?These symptoms may be an emergency. Get help right away. Call 911. ?Do not wait to see if the symptoms will go away. ?Do not drive yourself to the hospital. ?Summary ?Asthma is a long-term (chronic) condition that causes recurrent episodes in which the airways become tight and narrow. Asthma episodes, also called asthma attacks or asthma flares, can cause coughing, wheezing, shortness of breath, and chest pain. ?Asthma cannot be cured, but medicines and lifestyle changes can help keep it well controlled and prevent asthma flares. ?Make sure you understand how to avoid triggers and how and when to use your medicines. ?Asthma attacks can range from minor to life-threatening. Get help right away if you have an asthma attack and do not respond to treatment with your usual rescue medicines. ?This information is not intended to replace  advice given to you by your health care provider. Make sure you discuss any questions you have with your health care provider. ?Document Revised: 03/15/2021 Document Reviewed: 03/06/2021 ?Elsevier Patient Education ? 2023 [employer] ? ?

## 2021-10-11 ENCOUNTER — Encounter: Payer: Self-pay | Admitting: Internal Medicine

## 2021-10-11 ENCOUNTER — Ambulatory Visit
Admission: RE | Admit: 2021-10-11 | Discharge: 2021-10-11 | Disposition: A | Discharge status: Home / Self Care | Payer: Medicare HMO | Source: Ambulatory Visit | Attending: Internal Medicine | Admitting: Internal Medicine

## 2021-10-11 DIAGNOSIS — Z1231 Encounter for screening mammogram for malignant neoplasm of breast: Secondary | ICD-10-CM | POA: Diagnosis not present

## 2021-10-12 ENCOUNTER — Other Ambulatory Visit: Payer: Self-pay | Admitting: Internal Medicine

## 2021-10-12 DIAGNOSIS — M5136 Other intervertebral disc degeneration, lumbar region: Secondary | ICD-10-CM

## 2021-10-19 ENCOUNTER — Telehealth (HOSPITAL_BASED_OUTPATIENT_CLINIC_OR_DEPARTMENT_OTHER): Payer: Medicare HMO | Admitting: Psychiatry

## 2021-10-19 DIAGNOSIS — F4310 Post-traumatic stress disorder, unspecified: Secondary | ICD-10-CM

## 2021-10-19 DIAGNOSIS — F411 Generalized anxiety disorder: Secondary | ICD-10-CM

## 2021-10-19 DIAGNOSIS — F333 Major depressive disorder, recurrent, severe with psychotic symptoms: Secondary | ICD-10-CM

## 2021-10-19 DIAGNOSIS — G4701 Insomnia due to medical condition: Secondary | ICD-10-CM

## 2021-10-19 MED ORDER — HYDROXYZINE HCL 50 MG PO TABS: 50.0000 mg | ORAL_TABLET | Freq: Three times a day (TID) | ORAL | 0 refills | Status: DC

## 2021-10-19 MED ORDER — MIRTAZAPINE 45 MG PO TABS: 45.0000 mg | ORAL_TABLET | Freq: Every day | ORAL | 0 refills | Status: DC

## 2021-10-19 NOTE — Progress Notes (Signed)
Virtual Visit via Video Note ? ?I connected with Melody Jenkins on 10/19/21 at  1:00 PM EDT by a video enabled telemedicine application and verified that I am speaking with the correct person using two identifiers. ? ?Location: ?Patient: home ?Provider: office ?  ?I discussed the limitations of evaluation and management by telemedicine and the availability of in person appointments. The patient expressed understanding and agreed to proceed. ? ?History of Present Illness: ?Melody Jenkins shares that her pain doctor left suddenly. He was supposed to find a new place and Melody Jenkins planned to follow him to his new location but that didn't happen. She is now waiting on a referral to a new pain management clinic. She has run out of some of her pain meds about a week ago. She is trying to deal with the pain. It has all made her anxious and stressed out. It is frustrating to have to re-establish with a new doctor after working with a few for many years. The whole things is really stressing her out. Melody Jenkins is thinking of switching PCP's because she is not very comfortable with the one she has now. Melody Jenkins shares that her depression is stable and manageable. She is tired of dealing with stress after another. It feels like she is constantly dealing with some new problem. Her sleep is ok with meds. She is not getting enough sleep due to the pain. Melody Jenkins denies SI/HI.  ?  ?Observations/Objective: ?Psychiatric Specialty Exam: ?ROS  ?Last menstrual period 12/17/2017.There is no height or weight on file to calculate BMI.  ?General Appearance: Fairly Groomed and Neat  ?Eye Contact:  Minimal  ?Speech:  Clear and Coherent and Normal Rate  ?Volume:  Normal  ?Mood:  Anxious and Depressed  ?Affect:  Congruent  ?Thought Process:  Coherent and Descriptions of Associations: Circumstantial  ?Orientation:  Full (Time, Place, and Person)  ?Thought Content:  Rumination  ?Suicidal Thoughts:  No  ?Homicidal Thoughts:  No  ?Memory:  Immediate;   NA   ?Judgement:  Good  ?Insight:  Good  ?Psychomotor Activity:  Normal  ?Concentration:  Concentration: Fair  ?Recall:  Fair  ?Fund of Knowledge:  Good  ?Language:  Good  ?Akathisia:  No  ?Handed:  Right  ?AIMS (if indicated):     ?Assets:  Communication Skills ?Desire for Improvement ?Financial Resources/Insurance ?Housing ?Resilience ?Social Support ?Talents/Skills ?Transportation ?Vocational/Educational  ?ADL's:  Intact  ?Cognition:  WNL  ?Sleep:     ? ? ? ?Assessment and Plan: ? ? ?  08/24/2021  ? 10:16 AM 07/27/2021  ? 11:03 AM 04/24/2021  ? 10:53 AM 10/27/2020  ? 10:22 AM 09/08/2020  ? 10:07 AM  ?Depression screen PHQ 2/9  ?Decreased Interest 0 3 0 2 1  ?Down, Depressed, Hopeless 1 3 0 1 1  ?PHQ - 2 Score 1 6 0 3 2  ?Altered sleeping 2 0  3 3  ?Tired, decreased energy '2 3  2 3  '$ ?Change in appetite '3 2  3 3  '$ ?Feeling bad or failure about yourself  '3 3  3 3  '$ ?Trouble concentrating '3 3  2 3  '$ ?Moving slowly or fidgety/restless 0 1  0 0  ?Suicidal thoughts 1 0  0 0  ?PHQ-9 Score '15 18  16 17  '$ ?Difficult doing work/chores Very difficult Extremely dIfficult  Very difficult Very difficult  ? ? ?Flowsheet Row Video Visit from 08/24/2021 in [facility] Video Visit from 07/27/2021 in [facility] Video Visit  from 10/27/2020 in [facility]  ?C-SSRS RISK CATEGORY Error: Q3, 4, or 5 should not be populated when Q2 is No No Risk No Risk  ? ?  ? ? ? ?Status of current problems: worsening anxiety, depression is stable ? ?Meds:  ?Melody Jenkins does not want her meds changed at this time. ? ?1. GAD (generalized anxiety disorder) ?- hydrOXYzine (ATARAX) 50 MG tablet; Take 1 tablet (50 mg total) by mouth 3 (three) times daily.  Dispense: 270 tablet; Refill: 0 ?- mirtazapine (REMERON) 45 MG tablet; Take 1 tablet (45 mg total) by mouth at bedtime.  Dispense: 90 tablet; Refill: 0 ? 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  Dispense: 270 tablet; Refill: 0 ?- mirtazapine (REMERON) 45 MG tablet; Take 1 tablet (45 mg total) by mouth at bedtime.  Dispense: 90 tablet; Refill: 0 ? ?3. MDD (major depressive disorder), recurrent, severe, with psychosis ([place]) ?- mirtazapine (REMERON) 45 MG tablet; Take 1 tablet (45 mg total) by mouth at bedtime.  Dispense: 90 tablet; Refill: 0 ? ?4. PTSD (post-traumatic stress disorder) ?- mirtazapine (REMERON) 45 MG tablet; Take 1 tablet (45 mg total) by mouth at bedtime.  Dispense: 90 tablet; Refill: 0 ? ? ? ? ?Labs:   ?10/04/2021 HbA1c 6 ?04/05/2021 Cholesterol and LDL elevated ?Both are managed by her PCP ? ? ?Therapy: brief supportive therapy provided. Discussed psychosocial stressors in detail.  ? ?Collaboration of Care: Other none today ? ?Patient/Guardian was advised Release of Information must be obtained prior to any record release in order to collaborate their care with an outside provider. Patient/Guardian was advised if they have not already done so to contact the registration department to sign all necessary forms in order for [place] to release information regarding their care.  ? ?Consent: Patient/Guardian gives verbal consent for treatment and assignment of benefits for services provided during this visit. Patient/Guardian expressed understanding and agreed to proceed.  ? ?Follow Up Instructions: ?Follow up in 2-3 months or sooner if needed ?  ? ?I discussed the assessment and treatment plan with the patient. The patient was provided an opportunity to ask questions and all were answered. The patient agreed with the plan and demonstrated an understanding of the instructions. ?  ?The patient was advised to call back or seek an in-person evaluation if the symptoms worsen or if the condition fails to improve as anticipated. ? ?I provided 23 minutes of non-face-to-face time during this encounter. ? ? ?Charlcie Cradle, MD ? ?

## 2021-12-01 ENCOUNTER — Encounter: Payer: Self-pay | Admitting: Internal Medicine

## 2021-12-04 ENCOUNTER — Other Ambulatory Visit: Payer: Self-pay | Admitting: Internal Medicine

## 2021-12-04 DIAGNOSIS — M5136 Other intervertebral disc degeneration, lumbar region: Secondary | ICD-10-CM

## 2021-12-04 MED ORDER — MELOXICAM 7.5 MG PO TABS: 7.5000 mg | ORAL_TABLET | Freq: Every day | ORAL | 0 refills | Status: DC

## 2021-12-04 MED ORDER — OXYCODONE-ACETAMINOPHEN 5-325 MG PO TABS: 1.0000 | ORAL_TABLET | Freq: Four times a day (QID) | ORAL | 0 refills | Status: AC | PRN

## 2021-12-04 MED ORDER — CYCLOBENZAPRINE HCL 5 MG PO TABS: 5.0000 mg | ORAL_TABLET | Freq: Three times a day (TID) | ORAL | 0 refills | Status: AC | PRN

## 2021-12-13 ENCOUNTER — Other Ambulatory Visit: Payer: Self-pay | Admitting: Internal Medicine

## 2021-12-13 DIAGNOSIS — G8929 Other chronic pain: Secondary | ICD-10-CM | POA: Diagnosis not present

## 2021-12-13 DIAGNOSIS — Z6833 Body mass index (BMI) 33.0-33.9, adult: Secondary | ICD-10-CM | POA: Diagnosis not present

## 2021-12-13 DIAGNOSIS — Z79899 Other long term (current) drug therapy: Secondary | ICD-10-CM | POA: Diagnosis not present

## 2021-12-13 DIAGNOSIS — M47816 Spondylosis without myelopathy or radiculopathy, lumbar region: Secondary | ICD-10-CM | POA: Diagnosis not present

## 2021-12-13 DIAGNOSIS — Z79891 Long term (current) use of opiate analgesic: Secondary | ICD-10-CM | POA: Diagnosis not present

## 2021-12-13 DIAGNOSIS — I10 Essential (primary) hypertension: Secondary | ICD-10-CM

## 2021-12-13 DIAGNOSIS — M5136 Other intervertebral disc degeneration, lumbar region: Secondary | ICD-10-CM | POA: Diagnosis not present

## 2021-12-13 DIAGNOSIS — M545 Low back pain, unspecified: Secondary | ICD-10-CM | POA: Diagnosis not present

## 2021-12-13 DIAGNOSIS — E119 Type 2 diabetes mellitus without complications: Secondary | ICD-10-CM | POA: Diagnosis not present

## 2021-12-14 MED ORDER — INDAPAMIDE 1.25 MG PO TABS: 1.2500 mg | ORAL_TABLET | Freq: Every day | ORAL | 0 refills | Status: DC

## 2021-12-17 DIAGNOSIS — Z79899 Other long term (current) drug therapy: Secondary | ICD-10-CM | POA: Diagnosis not present

## 2021-12-18 ENCOUNTER — Other Ambulatory Visit: Payer: Self-pay | Admitting: Internal Medicine

## 2021-12-18 DIAGNOSIS — R9431 Abnormal electrocardiogram [ECG] [EKG]: Secondary | ICD-10-CM

## 2021-12-18 DIAGNOSIS — I10 Essential (primary) hypertension: Secondary | ICD-10-CM

## 2021-12-27 DIAGNOSIS — M545 Low back pain, unspecified: Secondary | ICD-10-CM | POA: Diagnosis not present

## 2021-12-27 DIAGNOSIS — Z6832 Body mass index (BMI) 32.0-32.9, adult: Secondary | ICD-10-CM | POA: Diagnosis not present

## 2021-12-27 DIAGNOSIS — Z79891 Long term (current) use of opiate analgesic: Secondary | ICD-10-CM | POA: Diagnosis not present

## 2021-12-27 DIAGNOSIS — F1120 Opioid dependence, uncomplicated: Secondary | ICD-10-CM | POA: Diagnosis not present

## 2021-12-27 DIAGNOSIS — G8929 Other chronic pain: Secondary | ICD-10-CM | POA: Diagnosis not present

## 2021-12-27 DIAGNOSIS — F17210 Nicotine dependence, cigarettes, uncomplicated: Secondary | ICD-10-CM | POA: Diagnosis not present

## 2021-12-27 DIAGNOSIS — I10 Essential (primary) hypertension: Secondary | ICD-10-CM | POA: Diagnosis not present

## 2022-01-04 ENCOUNTER — Telehealth (HOSPITAL_BASED_OUTPATIENT_CLINIC_OR_DEPARTMENT_OTHER): Payer: Medicare HMO | Admitting: Psychiatry

## 2022-01-04 DIAGNOSIS — G4701 Insomnia due to medical condition: Secondary | ICD-10-CM | POA: Diagnosis not present

## 2022-01-04 DIAGNOSIS — F333 Major depressive disorder, recurrent, severe with psychotic symptoms: Secondary | ICD-10-CM

## 2022-01-04 DIAGNOSIS — F411 Generalized anxiety disorder: Secondary | ICD-10-CM | POA: Diagnosis not present

## 2022-01-04 DIAGNOSIS — F4310 Post-traumatic stress disorder, unspecified: Secondary | ICD-10-CM | POA: Diagnosis not present

## 2022-01-04 MED ORDER — MIRTAZAPINE 45 MG PO TABS: 45.0000 mg | ORAL_TABLET | Freq: Every day | ORAL | 0 refills | Status: DC

## 2022-01-04 MED ORDER — HYDROXYZINE HCL 50 MG PO TABS: 50.0000 mg | ORAL_TABLET | Freq: Three times a day (TID) | ORAL | 0 refills | Status: DC

## 2022-01-04 NOTE — Progress Notes (Signed)
Virtual Visit via Video Note  I connected with Melody Jenkins on 01/04/22 at  9:30 AM EDT by   a video enabled telemedicine application and verified that I am speaking with the correct person using two identifiers.  Location: Patient: home Provider: office   I discussed the limitations of evaluation and management by telemedicine and the availability of in person appointments. The patient expressed understanding and agreed to proceed.  History of Present Illness: "I am alright". Tomorrow is the anniversary of her fiance's death. She had originally planned to go out of town to a show with friend to help avoid triggers for depression. In the past when she has visited his grave she cries and has passed out. Her daughter and her 2yo old moved back in with Melody Jenkins a 1 week ago. Now she doesn't have the money to go on the trip. Melody Jenkins is feeling stressed financially and emotionally. She is overwhelmed and feels bad that she can't help her daughter.  Last week she was seriously thinking of just leaving but doesn't know where to go. Melody Jenkins is frustrated because every year is another new problems to deal with. Her depression is ongoing. She has had a few bad breams at the beginning of the month but not lately. It takes her hours to fall asleep and then her sleep is broken. It has been worse since July 4th. She hopes she is strong enough to avoid visiting the grave site tomorrow. In the last 2 weeks she has been depressed about 5 days and has intense crying spells. Her anhedonia has been going on for a while. Melody Jenkins has been trying to move forward from him. She has been packing up his stuff. She really wants to move and thinks it will help. Melody Jenkins has a new pain management doctor and her pain is better now. Her frustration tolerance is very low and she reacts to small stressors. Her pain doctor wants her to go to the spine clinic. She went to the building but seeing it caused flashbacks. She has been avoiding  it for now. Her anxiety is worse and she is biting her nails again. Her appetite is poor. Melody Jenkins denies SI/HI. Vistaril helps to decrease her anxiety a little.   Observations/Objective: Psychiatric Specialty Exam: ROS  Last menstrual period 12/17/2017.There is no height or weight on file to calculate BMI.  General Appearance: Casual and Well Groomed  Eye Contact:  Good  Speech:  Clear and Coherent and Normal Rate  Volume:  Normal  Mood:  Anxious and Depressed  Affect:  Congruent  Thought Process:  Coherent and Descriptions of Associations: Circumstantial  Orientation:  Full (Time, Place, and Person)  Thought Content:  Rumination  Suicidal Thoughts:  No  Homicidal Thoughts:  No  Memory:  Immediate;   Good  Judgement:  Good  Insight:  Good  Psychomotor Activity:  Normal  Concentration:  Concentration: Good  Recall:  Good  Fund of Knowledge:  Good  Language:  Good  Akathisia:  No  Handed:  Right  AIMS (if indicated):     Assets:  Communication Skills Desire for Improvement Financial Resources/Insurance Housing Resilience Social Support Talents/Skills Transportation Vocational/Educational  ADL's:  Intact  Cognition:  WNL  Sleep:        Assessment and Plan:     01/04/2022    9:43 AM 08/24/2021   10:16 AM 07/27/2021   11:03 AM 04/24/2021   10:53 AM 10/27/2020   10:22 AM  Depression screen PHQ 2/9  Decreased Interest 2 0 3 0 2  Down, Depressed, Hopeless '2 1 3 '$ 0 1  PHQ - 2 Score '4 1 6 '$ 0 3  Altered sleeping 3 2 0  3  Tired, decreased energy '3 2 3  2  '$ Change in appetite '3 3 2  3  '$ Feeling bad or failure about yourself  '3 3 3  3  '$ Trouble concentrating '3 3 3  2  '$ Moving slowly or fidgety/restless 0 0 1  0  Suicidal thoughts 0 1 0  0  PHQ-9 Score '19 15 18  16  '$ Difficult doing work/chores Very difficult Very difficult Extremely dIfficult  Very difficult    Flowsheet Row Video Visit from 01/04/2022 in [facility] Video Visit from  08/24/2021 in [facility] Video Visit from 07/27/2021 in [facility]  C-SSRS RISK CATEGORY No Risk Error: Q3, 4, or 5 should not be populated when Q2 is No No Risk        Status of current problems: ongoing depression, anxiety and PTSD symptoms.   Meds:  1. MDD (major depressive disorder), recurrent, severe, with psychosis ([place]) - mirtazapine (REMERON) 45 MG tablet; Take 1 tablet (45 mg total) by mouth at bedtime.  Dispense: 90 tablet; Refill: 0  2. PTSD (post-traumatic stress disorder) - mirtazapine (REMERON) 45 MG tablet; Take 1 tablet (45 mg total) by mouth at bedtime.  Dispense: 90 tablet; Refill: 0  3. GAD (generalized anxiety disorder) - hydrOXYzine (ATARAX) 50 MG tablet; Take 1 tablet (50 mg total) by mouth 3 (three) times daily.  Dispense: 270 tablet; Refill: 0 - mirtazapine (REMERON) 45 MG tablet; Take 1 tablet (45 mg total) by mouth at bedtime.  Dispense: 90 tablet; Refill: 0  4. Insomnia due to medical condition - hydrOXYzine (ATARAX) 50 MG tablet; Take 1 tablet (50 mg total) by mouth 3 (three) times daily.  Dispense: 270 tablet; Refill: 0 - mirtazapine (REMERON) 45 MG tablet; Take 1 tablet (45 mg total) by mouth at bedtime.  Dispense: 90 tablet; Refill: 0     Labs: none today    Therapy: brief supportive therapy provided. Discussed psychosocial stressors in detail.    Collaboration of Care: Other none   Patient/Guardian was advised Release of Information must be obtained prior to any record release in order to collaborate their care with an outside provider. Patient/Guardian was advised if they have not already done so to contact the registration department to sign all necessary forms in order for [place] to release information regarding their care.   Consent: Patient/Guardian gives verbal consent for treatment and assignment of benefits for services provided during this visit. Patient/Guardian  expressed understanding and agreed to proceed.    Follow Up Instructions: Follow up in 2-3 months or sooner if needed    I discussed the assessment and treatment plan with the patient. The patient was provided an opportunity to ask questions and all were answered. The patient agreed with the plan and demonstrated an understanding of the instructions.   The patient was advised to call back or seek an in-person evaluation if the symptoms worsen or if the condition fails to improve as anticipated.  I provided 28 minutes of non-face-to-face time during this encounter.   Charlcie Cradle, MD

## 2022-01-15 DIAGNOSIS — M4316 Spondylolisthesis, lumbar region: Secondary | ICD-10-CM | POA: Diagnosis not present

## 2022-01-15 DIAGNOSIS — M1711 Unilateral primary osteoarthritis, right knee: Secondary | ICD-10-CM | POA: Diagnosis not present

## 2022-01-15 DIAGNOSIS — M25562 Pain in left knee: Secondary | ICD-10-CM | POA: Diagnosis not present

## 2022-01-15 DIAGNOSIS — M5417 Radiculopathy, lumbosacral region: Secondary | ICD-10-CM | POA: Diagnosis not present

## 2022-01-25 DIAGNOSIS — Z79899 Other long term (current) drug therapy: Secondary | ICD-10-CM | POA: Diagnosis not present

## 2022-01-25 DIAGNOSIS — F17210 Nicotine dependence, cigarettes, uncomplicated: Secondary | ICD-10-CM | POA: Diagnosis not present

## 2022-01-25 DIAGNOSIS — Z6831 Body mass index (BMI) 31.0-31.9, adult: Secondary | ICD-10-CM | POA: Diagnosis not present

## 2022-01-25 DIAGNOSIS — M545 Low back pain, unspecified: Secondary | ICD-10-CM | POA: Diagnosis not present

## 2022-01-25 DIAGNOSIS — M1711 Unilateral primary osteoarthritis, right knee: Secondary | ICD-10-CM | POA: Diagnosis not present

## 2022-01-25 DIAGNOSIS — I10 Essential (primary) hypertension: Secondary | ICD-10-CM | POA: Diagnosis not present

## 2022-01-25 DIAGNOSIS — F1120 Opioid dependence, uncomplicated: Secondary | ICD-10-CM | POA: Diagnosis not present

## 2022-01-25 DIAGNOSIS — G8929 Other chronic pain: Secondary | ICD-10-CM | POA: Diagnosis not present

## 2022-01-25 DIAGNOSIS — M4316 Spondylolisthesis, lumbar region: Secondary | ICD-10-CM | POA: Diagnosis not present

## 2022-01-30 ENCOUNTER — Encounter (HOSPITAL_COMMUNITY): Payer: Self-pay | Admitting: Emergency Medicine

## 2022-01-30 ENCOUNTER — Ambulatory Visit (INDEPENDENT_AMBULATORY_CARE_PROVIDER_SITE_OTHER): Payer: Medicare HMO

## 2022-01-30 ENCOUNTER — Ambulatory Visit (HOSPITAL_COMMUNITY)
Admission: EM | Admit: 2022-01-30 | Discharge: 2022-01-30 | Disposition: A | Discharge status: Home / Self Care | Payer: Medicare HMO | Attending: Family Medicine | Admitting: Family Medicine

## 2022-01-30 DIAGNOSIS — S8992XA Unspecified injury of left lower leg, initial encounter: Secondary | ICD-10-CM | POA: Diagnosis not present

## 2022-01-30 DIAGNOSIS — M25562 Pain in left knee: Secondary | ICD-10-CM

## 2022-01-30 MED ORDER — KETOROLAC TROMETHAMINE 60 MG/2ML IM SOLN
60.0000 mg | Freq: Once | INTRAMUSCULAR | Status: AC
  Administered 2022-01-30: 60 mg via INTRAMUSCULAR

## 2022-01-30 MED ORDER — PREDNISONE 20 MG PO TABS: 40.0000 mg | ORAL_TABLET | Freq: Every day | ORAL | 0 refills | Status: DC

## 2022-01-30 MED ORDER — KETOROLAC TROMETHAMINE 60 MG/2ML IM SOLN
INTRAMUSCULAR | Status: AC
  Filled 2022-01-30: qty 2

## 2022-01-30 NOTE — Progress Notes (Deleted)
56 y.o. [id number] Single Black or African American Not Hispanic or Latino female here for annual exam.      Patient's last menstrual period was 12/17/2017.          Sexually active: {yes no:314532}  The current method of family planning is {contraception:315051}.    Exercising: {yes no:314532}  {types:19826} Smoker:  {YES P5382123  Health Maintenance: Pap:  01/28/20 WNL Hr HPV Neg, 12/25/18 WNL HPV Neg,   History of abnormal Pap:  yes ECC 09/17/17 wnl  MMG:  10/11/21 density B Bi-rads 1 neg  BMD:   n/a Colonoscopy: 11/30/20 polyp f/u 5 years  TDaP:  02/20/2019 Gardasil: n/a   reports that she has been smoking cigarettes. She has been smoking an average of .25 packs per day. She has never used smokeless tobacco. She reports that she does not drink alcohol and does not use drugs.  Past Medical History:  Diagnosis Date   Anemia    Anxiety    Arthritis    Asthma    Carpal tunnel syndrome    Chronic back pain    Depression    Diabetes mellitus without complication (HCC)    Essential hypertension    Heart murmur    History of diabetes mellitus    History of multiple miscarriages    Hyperlipidemia    Torn rotator cuff     Past Surgical History:  Procedure Laterality Date   BREAST SURGERY  2000   reduction   CARPAL TUNNEL RELEASE     left   CESAREAN SECTION     COSMETIC SURGERY  2000   breast reduction   REDUCTION MAMMAPLASTY Bilateral    ROTATOR CUFF REPAIR  10/2019    Current Outpatient Medications  Medication Sig Dispense Refill   Biotin 5000 MCG CAPS Take 1 capsule by mouth.     Blood Glucose Monitoring Suppl (BLOOD GLUCOSE SYSTEM PAK) KIT Please dispense based on patient and insurance preference. Use as directed to monitor FSBS 2x daily. Dx: E11.9 1 kit 1   Cholecalciferol (VITAMIN D3) 25 MCG (1000 UT) CAPS Take by mouth 2 (two) times daily.     cloNIDine (CATAPRES) 0.2 MG tablet TAKE 1 TABLET TWICE DAILY 180 tablet 0   Cyanocobalamin (VITAMIN B 12 PO) Take 1 tablet by  mouth daily.      cyclobenzaprine (FLEXERIL) 5 MG tablet Take 1 tablet (5 mg total) by mouth 3 (three) times daily as needed for muscle spasms. 270 tablet 0   Fluticasone-Umeclidin-Vilant (TRELEGY ELLIPTA) 200-62.5-25 MCG/ACT AEPB Inhale 1 puff into the lungs daily. 120 each 1   gabapentin (NEURONTIN) 300 MG capsule Take 600 mg by mouth 3 (three) times daily.     Glucose Blood (BLOOD GLUCOSE TEST STRIPS) STRP Please dispense based on patient and insurance preference. Use as directed to monitor FSBS 2x daily. Dx: E11.9 200 strip 1   hydrocortisone 2.5 % ointment Apply topically 2 (two) times daily. To affected areas on face 60 g 2   hydrOXYzine (ATARAX) 50 MG tablet Take 1 tablet (50 mg total) by mouth 3 (three) times daily. 270 tablet 0   indapamide (LOZOL) 1.25 MG tablet Take 1 tablet (1.25 mg total) by mouth daily. 90 tablet 0   Lancets MISC Please dispense based on patient and insurance preference. Use as directed to monitor FSBS 2x daily. Dx: E11.9 200 each 1   meloxicam (MOBIC) 7.5 MG tablet Take 1 tablet (7.5 mg total) by mouth daily. 90 tablet 0   metoprolol succinate (  TOPROL-XL) 100 MG 24 hr tablet TAKE 1 TABLET EVERY DAY WITH OR IMMEDIATELY FOLLOWING A MEAL 90 tablet 0   mirtazapine (REMERON) 45 MG tablet Take 1 tablet (45 mg total) by mouth at bedtime. 90 tablet 0   Plecanatide (TRULANCE) 3 MG TABS Take 3 mg by mouth daily. Lot: [id number], EXP: 05-2022 9 tablet 0   polyethylene glycol (MIRALAX) 17 g packet Take 17 g by mouth 2 (two) times daily. 14 each 0   potassium chloride (KLOR-CON 10) 10 MEQ tablet Take 1 tablet (10 mEq total) by mouth 3 (three) times daily. 270 tablet 0   pramoxine-hydrocortisone (PROCTOCREAM-HC) 1-1 % rectal cream Apply a pea-sized amount rectally 2 to 3 times a day 30 g 1   rosuvastatin (CRESTOR) 10 MG tablet TAKE 1 TABLET EVERY DAY 90 tablet 1   TRULICITY 6.33 HL/4.5GY SOPN INJECT 0.75MG (1 PEN) UNDER THE SKIN EVERY WEEK 6 mL 1   No current facility-administered  medications for this visit.    Family History  Problem Relation Age of Onset   Diabetes Mother    Hypertension Mother    Breast cancer Mother 37   Hypertension Father    Heart disease Father    Early death Brother    Kidney disease Maternal Grandmother    Heart disease Brother    Colon cancer Neg Hx    Rectal cancer Neg Hx    Esophageal cancer Neg Hx    Pancreatic cancer Neg Hx    Liver disease Neg Hx    Stomach cancer Neg Hx     Review of Systems  Exam:   LMP 12/17/2017   Weight change: '@WEIGHTCHANGE' @ Height:      Ht Readings from Last 3 Encounters:  10/04/21 '5\' 2"'  (1.575 m)  04/24/21 '5\' 2"'  (1.575 m)  04/05/21 '5\' 8"'  (1.727 m)    General appearance: alert, cooperative and appears stated age Head: Normocephalic, without obvious abnormality, atraumatic Neck: no adenopathy, supple, symmetrical, trachea midline and thyroid {CHL AMB PHY EX THYROID NORM DEFAULT:[phone]::"normal to inspection and palpation"} Lungs: clear to auscultation bilaterally Cardiovascular: regular rate and rhythm Breasts: {Exam; breast:13139::"normal appearance, no masses or tenderness"} Abdomen: soft, non-tender; non distended,  no masses,  no organomegaly Extremities: extremities normal, atraumatic, no cyanosis or edema Skin: Skin color, texture, turgor normal. No rashes or lesions Lymph nodes: Cervical, supraclavicular, and axillary nodes normal. No abnormal inguinal nodes palpated Neurologic: Grossly normal   Pelvic: External genitalia:  no lesions              Urethra:  normal appearing urethra with no masses, tenderness or lesions              Bartholins and Skenes: normal                 Vagina: normal appearing vagina with normal color and discharge, no lesions              Cervix: {CHL AMB PHY EX CERVIX NORM DEFAULT:[phone]::"no lesions"}               Bimanual Exam:  Uterus:  {CHL AMB PHY EX UTERUS NORM DEFAULT:[phone]::"normal size, contour, position, consistency, mobility,  non-tender"}              Adnexa: {CHL AMB PHY EX ADNEXA NO MASS DEFAULT:[phone]::"no mass, fullness, tenderness"}               Rectovaginal: Confirms  Anus:  normal sphincter tone, no lesions  *** chaperoned for the exam.  A:  Well Woman with normal exam  P:

## 2022-01-30 NOTE — ED Triage Notes (Signed)
Metal drawer fell on left knee on Sunday. Pt reports that still having pain that not relieved by pain medications. Pt wants a shot in knee and not pain medication prescription. Reports swelling.

## 2022-01-30 NOTE — Discharge Instructions (Signed)
Meds ordered this encounter  Medications   ketorolac (TORADOL) injection 60 mg   predniSONE (DELTASONE) 20 MG tablet    Sig: Take 2 tablets (40 mg total) by mouth daily.    Dispense:  10 tablet    Refill:  0

## 2022-01-31 NOTE — ED Provider Notes (Signed)
[facility]   [id number] 01/30/22 Arrival Time: [place]:  1. Acute pain of left knee    I have personally viewed the imaging studies ordered this visit. No acute bony abnormalities on LEFT knee films. Discussed.  Trial of: Discharge Medication List as of 01/30/2022  7:59 PM     START taking these medications   Details  predniSONE (DELTASONE) 20 MG tablet Take 2 tablets (40 mg total) by mouth daily., Starting Tue 01/30/2022, Normal       Has appt with orthopaedist on Sept 10 if needed. Declines crutches. Encourage knee movement. Ice as needed.  Orders Placed This Encounter  Procedures   DG Knee Complete 4 Views Left    Work/school excuse note: not needed.   Reviewed expectations re: course of current medical issues. Questions answered. Outlined signs and symptoms indicating need for more acute intervention. Patient verbalized understanding. After Visit Summary given.  SUBJECTIVE: History from: patient. Melody Jenkins is a 56 y.o. female who reports cabinet drawer vs knee; 3 d ago; superior knee pain since; swelling that has improved with ice. Still with pain, esp with certain movements. No extremity sensation changes or weakness. OTC analgesics with minimal relief. Is ambulatory here.  Past Surgical History:  Procedure Laterality Date   BREAST SURGERY  2000   reduction   CARPAL TUNNEL RELEASE     left   CESAREAN SECTION     COSMETIC SURGERY  2000   breast reduction   REDUCTION MAMMAPLASTY Bilateral    ROTATOR CUFF REPAIR  10/2019      OBJECTIVE:  Vitals:   01/30/22 1915  BP: 125/84  Pulse: 74  Resp: 17  Temp: 97.8 F (36.6 C)  TempSrc: Oral  SpO2: 100%    General appearance: alert; no distress HEENT: [place]; AT Neck: supple with FROM Resp: unlabored respirations Extremities: LLE: warm with well perfused appearance; fairly well localized moderate tenderness over left superior knee; without gross deformities; swelling:  minimal; bruising: none; knee ROM: normal, with discomfort CV: brisk extremity capillary refill of LLE; 2+ DP pulse of LLE. Skin: warm and dry; no visible rashes Neurologic: gait normal; normal sensation and strength of LLE Psychological: alert and cooperative; normal mood and affect  Imaging: DG Knee Complete 4 Views Left  Result Date: 01/30/2022 CLINICAL DATA:  Trauma to the left knee. EXAM: LEFT KNEE - COMPLETE 4+ VIEW COMPARISON:  Knee radiograph dated 09/11/2018. FINDINGS: No acute fracture or dislocation. Relatively stable mild arthritic changes of the knee compared to 2020. no joint effusion. The soft tissues are unremarkable IMPRESSION: No acute fracture or dislocation. Electronically Signed   By: Anner Crete M.D.   On: 01/30/2022 19:43      Allergies  Allergen Reactions   Trazodone And Nefazodone Hives   Tape Rash    Past Medical History:  Diagnosis Date   Anemia    Anxiety    Arthritis    Asthma    Carpal tunnel syndrome    Chronic back pain    Depression    Diabetes mellitus without complication (HCC)    Essential hypertension    Heart murmur    History of diabetes mellitus    History of multiple miscarriages    Hyperlipidemia    Torn rotator cuff    Social History   Socioeconomic History   Marital status: Single    Spouse name: Not on file   Number of children: 1   Years of education: Not on file  Highest education level: Not on file  Occupational History   Occupation: disablity  Tobacco Use   Smoking status: Some Days    Packs/day: 0.25    Types: Cigarettes   Smokeless tobacco: Never  Vaping Use   Vaping Use: Never used  Substance and Sexual Activity   Alcohol use: No   Drug use: No   Sexual activity: Not Currently    Partners: Male    Birth control/protection: Post-menopausal  Other Topics Concern   Not on file  Social History Narrative   Not on file   Social Determinants of Health   Financial Resource Strain: Low Risk   (04/24/2021)   Overall Financial Resource Strain (CARDIA)    Difficulty of Paying Living Expenses: Not hard at all  Food Insecurity: No Food Insecurity (04/24/2021)   Hunger Vital Sign    Worried About Running Out of Food in the Last Year: Never true    Ran Out of Food in the Last Year: Never true  Transportation Needs: No Transportation Needs (04/24/2021)   PRAPARE - Hydrologist (Medical): No    Lack of Transportation (Non-Medical): No  Physical Activity: Inactive (04/24/2021)   Exercise Vital Sign    Days of Exercise per Week: 0 days    Minutes of Exercise per Session: 0 min  Stress: No Stress Concern Present (04/24/2021)   [place]    Feeling of Stress : Not at all  Social Connections: Unknown (12/25/2018)   Social Connection and Isolation Panel [NHANES]    Frequency of Communication with Friends and Family: Not asked    Frequency of Social Gatherings with Friends and Family: Not asked    Attends Religious Services: Not asked    Active Member of Clubs or Organizations: Not asked    Attends Archivist Meetings: Not asked    Marital Status: Not asked   Family History  Problem Relation Age of Onset   Diabetes Mother    Hypertension Mother    Breast cancer Mother 69   Hypertension Father    Heart disease Father    Early death Brother    Kidney disease Maternal Grandmother    Heart disease Brother    Colon cancer Neg Hx    Rectal cancer Neg Hx    Esophageal cancer Neg Hx    Pancreatic cancer Neg Hx    Liver disease Neg Hx    Stomach cancer Neg Hx    Past Surgical History:  Procedure Laterality Date   BREAST SURGERY  2000   reduction   CARPAL TUNNEL RELEASE     left   CESAREAN SECTION     COSMETIC SURGERY  2000   breast reduction   REDUCTION MAMMAPLASTY Bilateral    ROTATOR CUFF REPAIR  10/2019       Vanessa Kick, MD 01/31/22 [phone]

## 2022-02-01 DIAGNOSIS — Z79899 Other long term (current) drug therapy: Secondary | ICD-10-CM | POA: Diagnosis not present

## 2022-02-07 ENCOUNTER — Ambulatory Visit: Payer: Medicare HMO | Admitting: Obstetrics and Gynecology

## 2022-02-16 DIAGNOSIS — M4316 Spondylolisthesis, lumbar region: Secondary | ICD-10-CM | POA: Diagnosis not present

## 2022-02-16 DIAGNOSIS — M48062 Spinal stenosis, lumbar region with neurogenic claudication: Secondary | ICD-10-CM | POA: Diagnosis not present

## 2022-02-16 DIAGNOSIS — M5432 Sciatica, left side: Secondary | ICD-10-CM | POA: Diagnosis not present

## 2022-02-19 ENCOUNTER — Ambulatory Visit (INDEPENDENT_AMBULATORY_CARE_PROVIDER_SITE_OTHER): Payer: Medicare HMO | Admitting: Orthopaedic Surgery

## 2022-02-19 ENCOUNTER — Other Ambulatory Visit: Payer: Self-pay | Admitting: Orthopedic Surgery

## 2022-02-19 DIAGNOSIS — S8002XD Contusion of left knee, subsequent encounter: Secondary | ICD-10-CM | POA: Diagnosis not present

## 2022-02-19 DIAGNOSIS — M48062 Spinal stenosis, lumbar region with neurogenic claudication: Secondary | ICD-10-CM

## 2022-02-19 NOTE — Progress Notes (Signed)
The patient comes in today as a referral from emergency room due to an acute injury to her left knee that occurred a few weeks ago.  A steel door fell directly on her left knee and she points to just proximal of the patella as a source of her pain.  She does admit with a cane.  She is 56 years old.  She went to the emergency room about 3 days after the injury happened and x-rays were negative for any type of acute injury or fracture.  I have this from IV today.  She does walk with a slight limp.  Fortunately her extensor mechanism of the left knee is intact.  There is no knee joint effusion.  Her knee is ligamentously stable.  There is pain to palpation over the distal quadriceps area and the quad tendon itself but not on the patella.  I did review x-rays of her left knee which were negative for any acute injury or effusion.  She is likely contused the quad muscle and quad tendon and should do well with time.  She does have Voltaren gel at home that she uses for her back and suggested she must some of the Voltaren gel into this area to 3 times a day and just give this time.  Follow-up can be as needed.

## 2022-02-21 ENCOUNTER — Other Ambulatory Visit: Payer: Self-pay | Admitting: Internal Medicine

## 2022-02-21 ENCOUNTER — Telehealth: Payer: Self-pay

## 2022-02-21 DIAGNOSIS — L708 Other acne: Secondary | ICD-10-CM | POA: Insufficient documentation

## 2022-02-21 NOTE — Telephone Encounter (Signed)
Pt is requesting a new referral for Dermatologist for  Adult acne.  Pt prefers not to go back to [facility].   Pt CB [phone]

## 2022-02-22 NOTE — Progress Notes (Unsigned)
56 y.o. [id number] Single Black or African American Not Hispanic or Latino female here for annual exam.  No vaginal bleeding. Sexually active, new partner in the last 6 months, in love. No dyspareunia.   She has a boil on her face. It appeared 2 weeks ago, it's getting better.    Followed at the pain clinic for chronic back pain.   Patient's last menstrual period was 12/17/2017.          Sexually active: Yes.    The current method of family planning is post menopausal status.    Exercising: Yes.     Walking  Smoker:  yes, ~15 cigarettes a day  Health Maintenance: Pap: 01/28/20 WNL Hr HPV Neg; 12/25/18 WNL HPV Neg,   History of abnormal Pap:  yes ECC 09/17/17 WNL  MMG:  10/11/21 density B Bi-rads 1 neg  BMD:   none Colonoscopy:11/30/20 f/u 5 years  TDaP:  02/20/19  Gardasil: n/a   reports that she has been smoking cigarettes. She has been smoking an average of .25 packs per day. She has never used smokeless tobacco. She reports that she does not drink alcohol and does not use drugs.  She is on disability for chronic back pain. Daughter is local  Past Medical History:  Diagnosis Date   Anemia    Anxiety    Arthritis    Asthma    Carpal tunnel syndrome    Chronic back pain    Depression    Diabetes mellitus without complication (HCC)    Essential hypertension    Heart murmur    History of diabetes mellitus    History of multiple miscarriages    Hyperlipidemia    Torn rotator cuff     Past Surgical History:  Procedure Laterality Date   BREAST SURGERY  2000   reduction   CARPAL TUNNEL RELEASE     left   CESAREAN SECTION     COSMETIC SURGERY  2000   breast reduction   REDUCTION MAMMAPLASTY Bilateral    ROTATOR CUFF REPAIR  10/2019    Current Outpatient Medications  Medication Sig Dispense Refill   Biotin 5000 MCG CAPS Take 1 capsule by mouth.     Blood Glucose Monitoring Suppl (BLOOD GLUCOSE SYSTEM PAK) KIT Please dispense based on patient and insurance preference. Use as  directed to monitor FSBS 2x daily. Dx: E11.9 1 kit 1   Cholecalciferol (VITAMIN D3) 25 MCG (1000 UT) CAPS Take by mouth 2 (two) times daily.     cloNIDine (CATAPRES) 0.2 MG tablet TAKE 1 TABLET TWICE DAILY 180 tablet 0   Cyanocobalamin (VITAMIN B 12 PO) Take 1 tablet by mouth daily.      cyclobenzaprine (FLEXERIL) 5 MG tablet Take 1 tablet (5 mg total) by mouth 3 (three) times daily as needed for muscle spasms. 270 tablet 0   gabapentin (NEURONTIN) 300 MG capsule Take 600 mg by mouth 3 (three) times daily.     Glucose Blood (BLOOD GLUCOSE TEST STRIPS) STRP Please dispense based on patient and insurance preference. Use as directed to monitor FSBS 2x daily. Dx: E11.9 200 strip 1   hydrocortisone 2.5 % ointment Apply topically 2 (two) times daily. To affected areas on face 60 g 2   hydrOXYzine (ATARAX) 50 MG tablet Take 1 tablet (50 mg total) by mouth 3 (three) times daily. 270 tablet 0   indapamide (LOZOL) 1.25 MG tablet Take 1 tablet (1.25 mg total) by mouth daily. 90 tablet 0   Lancets MISC  Please dispense based on patient and insurance preference. Use as directed to monitor FSBS 2x daily. Dx: E11.9 200 each 1   meloxicam (MOBIC) 7.5 MG tablet Take 1 tablet (7.5 mg total) by mouth daily. 90 tablet 0   metoprolol succinate (TOPROL-XL) 100 MG 24 hr tablet TAKE 1 TABLET EVERY DAY WITH OR IMMEDIATELY FOLLOWING A MEAL 90 tablet 0   mirtazapine (REMERON) 45 MG tablet Take 1 tablet (45 mg total) by mouth at bedtime. 90 tablet 0   oxyCODONE-acetaminophen (PERCOCET) 5-325 MG tablet take 1 tablet by oral route  every 8 hours as needed.     Plecanatide (TRULANCE) 3 MG TABS Take 3 mg by mouth daily. Lot: [id number], EXP: 05-2022 9 tablet 0   potassium chloride (KLOR-CON 10) 10 MEQ tablet Take 1 tablet (10 mEq total) by mouth 3 (three) times daily. 270 tablet 0   pramoxine-hydrocortisone (PROCTOCREAM-HC) 1-1 % rectal cream Apply a pea-sized amount rectally 2 to 3 times a day 30 g 1   rosuvastatin (CRESTOR) 10 MG  tablet TAKE 1 TABLET EVERY DAY 90 tablet 1   TRULICITY 0.62 BJ/6.2GB SOPN INJECT 0.75MG (1 PEN) UNDER THE SKIN EVERY WEEK 6 mL 1   Fluticasone-Umeclidin-Vilant (TRELEGY ELLIPTA) 200-62.5-25 MCG/ACT AEPB Inhale 1 puff into the lungs daily. (Patient not taking: Reported on 03/01/2022) 120 each 1   polyethylene glycol (MIRALAX) 17 g packet Take 17 g by mouth 2 (two) times daily. (Patient not taking: Reported on 03/01/2022) 14 each 0   No current facility-administered medications for this visit.    Family History  Problem Relation Age of Onset   Diabetes Mother    Hypertension Mother    Breast cancer Mother 49   Hypertension Father    Heart disease Father    Early death Brother    Kidney disease Maternal Grandmother    Heart disease Brother    Colon cancer Neg Hx    Rectal cancer Neg Hx    Esophageal cancer Neg Hx    Pancreatic cancer Neg Hx    Liver disease Neg Hx    Stomach cancer Neg Hx     Review of Systems  Exam:   BP 112/66   Pulse 67   Resp 14   Ht _0  (1.702 m)   Wt 205 lb (93 kg)   LMP 12/17/2017   BMI 32.11 kg/m   Weight change: _1 @ Height:   Height: _2  (170.2 cm)  Ht Readings from Last 3 Encounters:  03/01/22 _3  (1.702 m)  10/04/21 _4  (1.575 m)  04/24/21 _5  (1.575 m)    General appearance: alert, cooperative and appears stated age Head: Normocephalic, without obvious abnormality, atraumatic Neck: no adenopathy, supple, symmetrical, trachea midline and thyroid normal to inspection and palpation Breasts: normal appearance, no masses or tenderness Abdomen: soft, non-tender; non distended,  no masses,  no organomegaly Extremities: extremities normal, atraumatic, no cyanosis or edema Skin: Skin color, texture, turgor normal. No rashes or lesions Lymph nodes: Cervical, supraclavicular, and axillary nodes normal. No abnormal inguinal nodes palpated Neurologic: Grossly normal   Pelvic: External genitalia:  no lesions              Urethra:   normal appearing urethra with no masses, tenderness or lesions              Bartholins and Skenes: normal                 Vagina: normal appearing vagina with normal color  and discharge, no lesions              Cervix: no lesions               Bimanual Exam:  Uterus:  normal size, contour, position, consistency, mobility, non-tender              Adnexa: no mass, fullness, tenderness               Rectovaginal: Confirms               Anus:  normal sphincter tone, no lesions   1. Encounter for breast and pelvic examination Discussed breast self exam Discussed calcium and vit D intake Labs with primary Pap next year Mammogram and colonoscopy are UTD  2. Screening examination for STD (sexually transmitted disease) Declines blood work, had labs at the health [place] CT/NG/T. Vaginalis

## 2022-02-27 ENCOUNTER — Encounter: Payer: Self-pay | Admitting: Internal Medicine

## 2022-03-01 ENCOUNTER — Encounter: Payer: Self-pay | Admitting: Obstetrics and Gynecology

## 2022-03-01 ENCOUNTER — Other Ambulatory Visit: Payer: Self-pay | Admitting: Internal Medicine

## 2022-03-01 ENCOUNTER — Ambulatory Visit (INDEPENDENT_AMBULATORY_CARE_PROVIDER_SITE_OTHER): Payer: Medicare HMO | Admitting: Obstetrics and Gynecology

## 2022-03-01 VITALS — BP 112/66 | HR 67 | Resp 14 | Ht 67.0 in | Wt 205.0 lb

## 2022-03-01 DIAGNOSIS — B009 Herpesviral infection, unspecified: Secondary | ICD-10-CM | POA: Diagnosis not present

## 2022-03-01 DIAGNOSIS — Z113 Encounter for screening for infections with a predominantly sexual mode of transmission: Secondary | ICD-10-CM

## 2022-03-01 DIAGNOSIS — Z01419 Encounter for gynecological examination (general) (routine) without abnormal findings: Secondary | ICD-10-CM

## 2022-03-01 DIAGNOSIS — L708 Other acne: Secondary | ICD-10-CM

## 2022-03-01 DIAGNOSIS — Z9189 Other specified personal risk factors, not elsewhere classified: Secondary | ICD-10-CM

## 2022-03-01 NOTE — Patient Instructions (Signed)

## 2022-03-02 ENCOUNTER — Other Ambulatory Visit: Payer: Self-pay | Admitting: Internal Medicine

## 2022-03-02 DIAGNOSIS — M5136 Other intervertebral disc degeneration, lumbar region: Secondary | ICD-10-CM

## 2022-03-02 LAB — SURESWAB CT/NG/T. VAGINALIS
C. trachomatis RNA, TMA: NOT DETECTED
N. gonorrhoeae RNA, TMA: NOT DETECTED
Trichomonas vaginalis RNA: NOT DETECTED

## 2022-03-05 DIAGNOSIS — M25561 Pain in right knee: Secondary | ICD-10-CM | POA: Diagnosis not present

## 2022-03-05 DIAGNOSIS — M1711 Unilateral primary osteoarthritis, right knee: Secondary | ICD-10-CM | POA: Diagnosis not present

## 2022-03-05 DIAGNOSIS — I10 Essential (primary) hypertension: Secondary | ICD-10-CM | POA: Diagnosis not present

## 2022-03-05 DIAGNOSIS — L0201 Cutaneous abscess of face: Secondary | ICD-10-CM | POA: Diagnosis not present

## 2022-03-05 DIAGNOSIS — Z6831 Body mass index (BMI) 31.0-31.9, adult: Secondary | ICD-10-CM | POA: Diagnosis not present

## 2022-03-05 DIAGNOSIS — M545 Low back pain, unspecified: Secondary | ICD-10-CM | POA: Diagnosis not present

## 2022-03-05 DIAGNOSIS — G8929 Other chronic pain: Secondary | ICD-10-CM | POA: Diagnosis not present

## 2022-03-05 DIAGNOSIS — Z79899 Other long term (current) drug therapy: Secondary | ICD-10-CM | POA: Diagnosis not present

## 2022-03-05 DIAGNOSIS — F1290 Cannabis use, unspecified, uncomplicated: Secondary | ICD-10-CM | POA: Diagnosis not present

## 2022-03-05 DIAGNOSIS — F17210 Nicotine dependence, cigarettes, uncomplicated: Secondary | ICD-10-CM | POA: Diagnosis not present

## 2022-03-08 ENCOUNTER — Telehealth (HOSPITAL_COMMUNITY): Payer: Medicare HMO | Admitting: Psychiatry

## 2022-03-12 ENCOUNTER — Other Ambulatory Visit: Payer: Medicare HMO

## 2022-03-12 DIAGNOSIS — Z79899 Other long term (current) drug therapy: Secondary | ICD-10-CM | POA: Diagnosis not present

## 2022-03-15 ENCOUNTER — Telehealth (HOSPITAL_COMMUNITY): Payer: Medicare HMO | Admitting: Psychiatry

## 2022-03-15 ENCOUNTER — Telehealth (HOSPITAL_COMMUNITY): Payer: Self-pay | Admitting: Psychiatry

## 2022-03-15 NOTE — Telephone Encounter (Signed)
Patient was not present on video platform used through mychart. I called the patient at our scheduled appointment time. There was no answer. I left a voice message for patient to call the clinic back at their convinence. There was no return phone call during out scheduled visit time. I was not able to speak with the patient today, as they were a no show for their scheduled appointment.   

## 2022-03-23 ENCOUNTER — Other Ambulatory Visit: Payer: Self-pay | Admitting: Internal Medicine

## 2022-03-23 ENCOUNTER — Encounter: Payer: Self-pay | Admitting: Internal Medicine

## 2022-03-23 ENCOUNTER — Ambulatory Visit (INDEPENDENT_AMBULATORY_CARE_PROVIDER_SITE_OTHER): Payer: Medicare HMO | Admitting: Internal Medicine

## 2022-03-23 DIAGNOSIS — L700 Acne vulgaris: Secondary | ICD-10-CM | POA: Diagnosis not present

## 2022-03-23 MED ORDER — FLUCONAZOLE 150 MG PO TABS: 150.0000 mg | ORAL_TABLET | ORAL | 0 refills | Status: DC

## 2022-03-23 MED ORDER — DOXYCYCLINE HYCLATE 100 MG PO TABS
100.0000 mg | ORAL_TABLET | Freq: Two times a day (BID) | ORAL | 0 refills | Status: AC
Start: 2022-03-23 — End: 2022-04-06

## 2022-03-23 NOTE — Progress Notes (Signed)
   Subjective:   Patient ID: Melody Jenkins, female    DOB: 07-Sep-1965, 56 y.o.   MRN: [id number]  HPI The patient is a 56 YO female coming in for cystic acne. Referred by PCP to dermatology. Took prednisone for injury in August and this triggered it, has not improved. Took 1 week keflex end of September which helped slightly but not much.  Review of Systems  Constitutional: Negative.   HENT: Negative.    Eyes: Negative.   Respiratory:  Negative for cough, chest tightness and shortness of breath.   Cardiovascular:  Negative for chest pain, palpitations and leg swelling.  Gastrointestinal:  Negative for abdominal distention, abdominal pain, constipation, diarrhea, nausea and vomiting.  Musculoskeletal: Negative.   Skin:  Positive for wound.  Neurological: Negative.   Psychiatric/Behavioral: Negative.      Objective:  Physical Exam Constitutional:      Appearance: She is well-developed.  HENT:     Head: Normocephalic and atraumatic.     Comments: 1 cystic acne lesion right chin with about 2-3 mm circular abscess and scab on the chin about 1-2 mm Cardiovascular:     Rate and Rhythm: Normal rate and regular rhythm.  Pulmonary:     Effort: Pulmonary effort is normal. No respiratory distress.     Breath sounds: Normal breath sounds. No wheezing or rales.  Abdominal:     General: Bowel sounds are normal. There is no distension.     Palpations: Abdomen is soft.     Tenderness: There is no abdominal tenderness. There is no rebound.  Musculoskeletal:     Cervical back: Normal range of motion.  Skin:    General: Skin is warm and dry.  Neurological:     Mental Status: She is alert and oriented to person, place, and time.     Coordination: Coordination normal.     Vitals:   03/23/22 0858  BP: 118/78  Pulse: 83  Temp: 98.3 F (36.8 C)  TempSrc: Oral  SpO2: 99%  Weight: 208 lb (94.3 kg)  Height: '5\' 7"'$  (1.702 m)    Assessment & Plan:

## 2022-03-23 NOTE — Patient Instructions (Addendum)
We have sent in doxycycline to take 1 pill twice a day for 2 weeks. If you take the first week and feel 100% better it is okay to stop. If not 100% better take the whole 2 weeks.  We have sent in diflucan which is a yeast infection medicine to take if needed up to every 3 days.

## 2022-03-23 NOTE — Assessment & Plan Note (Signed)
Triggered by prednisone course. Rx doxycyline 2 week course and diflucan to cover in case of yeast infection. Can keep visit with dermatology (but this is 6 months ish out) for if needed. Follow up 2-3 weeks if not resolved with PCP.

## 2022-04-03 ENCOUNTER — Encounter: Payer: Self-pay | Admitting: Internal Medicine

## 2022-04-04 ENCOUNTER — Ambulatory Visit: Payer: Medicare HMO | Admitting: Internal Medicine

## 2022-04-04 DIAGNOSIS — G8929 Other chronic pain: Secondary | ICD-10-CM | POA: Diagnosis not present

## 2022-04-04 DIAGNOSIS — M129 Arthropathy, unspecified: Secondary | ICD-10-CM | POA: Diagnosis not present

## 2022-04-04 DIAGNOSIS — M545 Low back pain, unspecified: Secondary | ICD-10-CM | POA: Diagnosis not present

## 2022-04-04 DIAGNOSIS — Z6831 Body mass index (BMI) 31.0-31.9, adult: Secondary | ICD-10-CM | POA: Diagnosis not present

## 2022-04-04 DIAGNOSIS — Z79899 Other long term (current) drug therapy: Secondary | ICD-10-CM | POA: Diagnosis not present

## 2022-04-05 ENCOUNTER — Telehealth (HOSPITAL_COMMUNITY): Payer: Self-pay | Admitting: Psychiatry

## 2022-04-05 ENCOUNTER — Ambulatory Visit: Payer: Medicare HMO | Admitting: Internal Medicine

## 2022-04-05 ENCOUNTER — Telehealth (HOSPITAL_COMMUNITY): Payer: Medicare HMO | Admitting: Psychiatry

## 2022-04-05 ENCOUNTER — Encounter (HOSPITAL_COMMUNITY): Payer: Self-pay

## 2022-04-05 NOTE — Telephone Encounter (Signed)
Patient was not present on video platform used through mychart. I called the patient at our scheduled appointment time. There was no answer. I left a voice message for patient to call the clinic back at their convinence. There was no return phone call during out scheduled visit time. I was not able to speak with the patient today, as they were a no show for their scheduled appointment.   

## 2022-04-23 ENCOUNTER — Encounter: Payer: Self-pay | Admitting: Internal Medicine

## 2022-04-23 ENCOUNTER — Ambulatory Visit (INDEPENDENT_AMBULATORY_CARE_PROVIDER_SITE_OTHER): Payer: Medicare HMO | Admitting: Internal Medicine

## 2022-04-23 VITALS — BP 128/82 | HR 83 | Temp 98.2°F | Ht 67.0 in | Wt 211.0 lb

## 2022-04-23 DIAGNOSIS — Z23 Encounter for immunization: Secondary | ICD-10-CM

## 2022-04-23 DIAGNOSIS — J4530 Mild persistent asthma, uncomplicated: Secondary | ICD-10-CM

## 2022-04-23 DIAGNOSIS — Z0001 Encounter for general adult medical examination with abnormal findings: Secondary | ICD-10-CM

## 2022-04-23 DIAGNOSIS — E876 Hypokalemia: Secondary | ICD-10-CM | POA: Diagnosis not present

## 2022-04-23 DIAGNOSIS — I10 Essential (primary) hypertension: Secondary | ICD-10-CM

## 2022-04-23 DIAGNOSIS — L700 Acne vulgaris: Secondary | ICD-10-CM

## 2022-04-23 DIAGNOSIS — T502X5A Adverse effect of carbonic-anhydrase inhibitors, benzothiadiazides and other diuretics, initial encounter: Secondary | ICD-10-CM

## 2022-04-23 DIAGNOSIS — E785 Hyperlipidemia, unspecified: Secondary | ICD-10-CM | POA: Diagnosis not present

## 2022-04-23 DIAGNOSIS — E1169 Type 2 diabetes mellitus with other specified complication: Secondary | ICD-10-CM | POA: Diagnosis not present

## 2022-04-23 LAB — URINALYSIS, ROUTINE W REFLEX MICROSCOPIC
Bilirubin Urine: NEGATIVE
Ketones, ur: NEGATIVE
Leukocytes,Ua: NEGATIVE
Nitrite: NEGATIVE
Specific Gravity, Urine: >=1.03 — AB (ref 1.000–1.030)
Total Protein, Urine: NEGATIVE
Urine Glucose: NEGATIVE
Urobilinogen, UA: 0.2 (ref 0.0–1.0)
pH: 5.5 (ref 5.0–8.0)

## 2022-04-23 LAB — CBC WITH DIFFERENTIAL/PLATELET
Basophils Absolute: 0 10*3/uL (ref 0.0–0.1)
Basophils Relative: 0.6 % (ref 0.0–3.0)
Eosinophils Absolute: 0.2 10*3/uL (ref 0.0–0.7)
Eosinophils Relative: 2.2 % (ref 0.0–5.0)
HCT: 45.5 % (ref 36.0–46.0)
Hemoglobin: 14.9 g/dL (ref 12.0–15.0)
Lymphocytes Relative: 49.5 % — ABNORMAL HIGH (ref 12.0–46.0)
Lymphs Abs: 3.7 10*3/uL (ref 0.7–4.0)
MCHC: 32.7 g/dL (ref 30.0–36.0)
MCV: 83.5 fl (ref 78.0–100.0)
Monocytes Absolute: 0.7 10*3/uL (ref 0.1–1.0)
Monocytes Relative: 9.4 % (ref 3.0–12.0)
Neutro Abs: 2.8 10*3/uL (ref 1.4–7.7)
Neutrophils Relative %: 38.3 % — ABNORMAL LOW (ref 43.0–77.0)
Platelets: 231 10*3/uL (ref 150.0–400.0)
RBC: 5.44 Mil/uL — ABNORMAL HIGH (ref 3.87–5.11)
RDW: 14.9 % (ref 11.5–15.5)
WBC: 7.4 10*3/uL (ref 4.0–10.5)

## 2022-04-23 LAB — HEPATIC FUNCTION PANEL
ALT: 15 U/L (ref 0–35)
AST: 19 U/L (ref 0–37)
Albumin: 4.1 g/dL (ref 3.5–5.2)
Alkaline Phosphatase: 65 U/L (ref 39–117)
Bilirubin, Direct: 0.1 mg/dL (ref 0.0–0.3)
Total Bilirubin: 0.4 mg/dL (ref 0.2–1.2)
Total Protein: 7.2 g/dL (ref 6.0–8.3)

## 2022-04-23 LAB — BASIC METABOLIC PANEL
BUN: 14 mg/dL (ref 6–23)
CO2: 34 mEq/L — ABNORMAL HIGH (ref 19–32)
Calcium: 9.7 mg/dL (ref 8.4–10.5)
Chloride: 100 mEq/L (ref 96–112)
Creatinine, Ser: 0.81 mg/dL (ref 0.40–1.20)
GFR: 80.71 mL/min (ref >60.00)
Glucose, Bld: 93 mg/dL (ref 70–99)
Potassium: 3.4 mEq/L — ABNORMAL LOW (ref 3.5–5.1)
Sodium: 141 mEq/L (ref 135–145)

## 2022-04-23 LAB — LDL CHOLESTEROL, DIRECT: Direct LDL: 95 mg/dL

## 2022-04-23 LAB — MICROALBUMIN / CREATININE URINE RATIO
Creatinine,U: 163 mg/dL
Microalb Creat Ratio: 0.5 mg/g (ref 0.0–30.0)
Microalb, Ur: 0.8 mg/dL (ref 0.0–1.9)

## 2022-04-23 LAB — LIPID PANEL
Cholesterol: 162 mg/dL (ref 0–200)
HDL: 34.7 mg/dL — ABNORMAL LOW (ref >39.00)
NonHDL: 127.66
Total CHOL/HDL Ratio: 5
Triglycerides: 215 mg/dL — ABNORMAL HIGH (ref 0.0–149.0)
VLDL: 43 mg/dL — ABNORMAL HIGH (ref 0.0–40.0)

## 2022-04-23 LAB — TSH: TSH: 0.65 u[IU]/mL (ref 0.35–5.50)

## 2022-04-23 LAB — HEMOGLOBIN A1C: Hgb A1c MFr Bld: 6.3 % (ref 4.6–6.5)

## 2022-04-23 MED ORDER — ROSUVASTATIN CALCIUM 10 MG PO TABS: 10.0000 mg | ORAL_TABLET | Freq: Every day | ORAL | 1 refills | Status: AC

## 2022-04-23 MED ORDER — POTASSIUM CHLORIDE ER 10 MEQ PO TBCR
10.0000 meq | EXTENDED_RELEASE_TABLET | Freq: Three times a day (TID) | ORAL | 0 refills | Status: DC
Start: 2022-04-23 — End: 2022-07-25

## 2022-04-23 MED ORDER — TRELEGY ELLIPTA 200-62.5-25 MCG/ACT IN AEPB: 1.0000 | INHALATION_SPRAY | Freq: Every day | RESPIRATORY_TRACT | 1 refills | Status: DC

## 2022-04-23 NOTE — Patient Instructions (Signed)

## 2022-04-23 NOTE — Progress Notes (Signed)
Subjective:  Patient ID: Melody Jenkins, female    DOB: [birth year]  Age: 56 y.o. MRN: [id number]  CC: Annual Exam, Hypertension, Hyperlipidemia, COPD, and Diabetes   HPI Melody Jenkins presents for a CPX and f/up -   She complains of 2 bumps on her chin. She has been using multiple remedies including using a razor the one on the left side. She is active and denies SOB, DOE, CP, edema, palpitations.  Outpatient Medications Prior to Visit  Medication Sig Dispense Refill   Blood Glucose Monitoring Suppl (BLOOD GLUCOSE SYSTEM PAK) KIT Please dispense based on patient and insurance preference. Use as directed to monitor FSBS 2x daily. Dx: E11.9 1 kit 1   Cholecalciferol (VITAMIN D3) 25 MCG (1000 UT) CAPS Take by mouth 2 (two) times daily.     cloNIDine (CATAPRES) 0.2 MG tablet TAKE 1 TABLET TWICE DAILY 180 tablet 0   Cyanocobalamin (VITAMIN B 12 PO) Take 1 tablet by mouth daily.      cyclobenzaprine (FLEXERIL) 5 MG tablet Take 1 tablet (5 mg total) by mouth 3 (three) times daily as needed for muscle spasms. 270 tablet 0   fluconazole (DIFLUCAN) 150 MG tablet Take 1 tablet (150 mg total) by mouth every 3 (three) days. 5 tablet 0   gabapentin (NEURONTIN) 300 MG capsule Take 600 mg by mouth 3 (three) times daily.     Glucose Blood (BLOOD GLUCOSE TEST STRIPS) STRP Please dispense based on patient and insurance preference. Use as directed to monitor FSBS 2x daily. Dx: E11.9 200 strip 1   hydrocortisone 2.5 % ointment Apply topically 2 (two) times daily. To affected areas on face 60 g 2   hydrOXYzine (ATARAX) 50 MG tablet Take 1 tablet (50 mg total) by mouth 3 (three) times daily. 270 tablet 0   Lancets MISC Please dispense based on patient and insurance preference. Use as directed to monitor FSBS 2x daily. Dx: E11.9 200 each 1   meloxicam (MOBIC) 7.5 MG tablet TAKE 1 TABLET(7.5 MG) BY MOUTH DAILY 90 tablet 0   metoprolol succinate (TOPROL-XL) 100 MG 24 hr tablet TAKE 1 TABLET EVERY DAY WITH  OR IMMEDIATELY FOLLOWING A MEAL 90 tablet 0   mirtazapine (REMERON) 45 MG tablet Take 1 tablet (45 mg total) by mouth at bedtime. 90 tablet 0   oxyCODONE-acetaminophen (PERCOCET) 5-325 MG tablet take 1 tablet by oral route  every 8 hours as needed.     polyethylene glycol (MIRALAX) 17 g packet Take 17 g by mouth 2 (two) times daily. 14 each 0   TRULICITY 9.39 QZ/0.0PQ SOPN INJECT 0.75MG (1 PEN) UNDER THE SKIN EVERY WEEK 6 mL 1   Biotin 5000 MCG CAPS Take 1 capsule by mouth.     Fluticasone-Umeclidin-Vilant (TRELEGY ELLIPTA) 200-62.5-25 MCG/ACT AEPB Inhale 1 puff into the lungs daily. 120 each 1   indapamide (LOZOL) 1.25 MG tablet Take 1 tablet (1.25 mg total) by mouth daily. 90 tablet 0   Plecanatide (TRULANCE) 3 MG TABS Take 3 mg by mouth daily. Lot: [id number], EXP: 05-2022 9 tablet 0   potassium chloride (KLOR-CON 10) 10 MEQ tablet Take 1 tablet (10 mEq total) by mouth 3 (three) times daily. 270 tablet 0   pramoxine-hydrocortisone (PROCTOCREAM-HC) 1-1 % rectal cream Apply a pea-sized amount rectally 2 to 3 times a day 30 g 1   rosuvastatin (CRESTOR) 10 MG tablet TAKE 1 TABLET EVERY DAY 90 tablet 1   No facility-administered medications prior to visit.  ROS Review of Systems  Constitutional: Negative.  Negative for diaphoresis, fatigue and unexpected weight change.  HENT: Negative.    Eyes: Negative.   Respiratory:  Negative for cough, chest tightness, shortness of breath and wheezing.   Cardiovascular:  Negative for chest pain, palpitations and leg swelling.  Gastrointestinal:  Negative for abdominal pain, constipation, diarrhea, nausea and vomiting.  Endocrine: Negative.   Genitourinary: Negative.  Negative for difficulty urinating, hematuria and urgency.  Musculoskeletal:  Positive for back pain. Negative for myalgias.  Skin:  Positive for color change.  Neurological: Negative.  Negative for dizziness, weakness, light-headedness and numbness.  Hematological:  Negative for adenopathy.  Does not bruise/bleed easily.  Psychiatric/Behavioral: Negative.      Objective:  BP 128/82 (BP Location: Left Arm, Patient Position: Sitting, Cuff Size: Large)   Pulse 83   Temp 98.2 F (36.8 C) (Oral)   Ht _0  (1.702 m)   Wt 211 lb (95.7 kg)   LMP 12/17/2017   SpO2 97%   BMI 33.05 kg/m   BP Readings from Last 3 Encounters:  04/23/22 128/82  03/23/22 118/78  03/01/22 112/66    Wt Readings from Last 3 Encounters:  04/23/22 211 lb (95.7 kg)  03/23/22 208 lb (94.3 kg)  03/01/22 205 lb (93 kg)    Physical Exam Vitals reviewed.  Constitutional:      Appearance: Normal appearance.  HENT:     Head:     Comments: Picker's nodules on left chin Cystic acne right chin- hard, hyperpigmented with no exudate, warmth, or fluctuance.    Nose: Nose normal.  Eyes:     General: No scleral icterus.    Conjunctiva/sclera: Conjunctivae normal.  Cardiovascular:     Rate and Rhythm: Normal rate and regular rhythm.     Heart sounds: No murmur heard.    No friction rub.  Pulmonary:     Effort: Pulmonary effort is normal.     Breath sounds: No stridor. No wheezing, rhonchi or rales.  Abdominal:     General: Abdomen is flat.     Palpations: There is no mass.     Tenderness: There is no abdominal tenderness. There is no guarding.     Hernia: No hernia is present.  Musculoskeletal:        General: Normal range of motion.     Cervical back: Neck supple.     Right lower leg: No edema.     Left lower leg: No edema.  Skin:    General: Skin is warm and dry.     Findings: Lesion and rash present.  Neurological:     General: No focal deficit present.     Mental Status: She is alert.  Psychiatric:        Mood and Affect: Mood normal.        Behavior: Behavior normal.     Lab Results  Component Value Date   WBC 7.4 04/23/2022   HGB 14.9 04/23/2022   HCT 45.5 04/23/2022   PLT 231.0 04/23/2022   GLUCOSE 93 04/23/2022   CHOL 162 04/23/2022   TRIG 215.0 (H) 04/23/2022   HDL  34.70 (L) 04/23/2022   LDLDIRECT 95.0 04/23/2022   LDLCALC 176 (H) 04/05/2021   ALT 15 04/23/2022   AST 19 04/23/2022   NA 141 04/23/2022   K 3.4 (L) 04/23/2022   CL 100 04/23/2022   CREATININE 0.81 04/23/2022   BUN 14 04/23/2022   CO2 34 (H) 04/23/2022   TSH 0.65 04/23/2022  HGBA1C 6.3 04/23/2022   MICROALBUR 0.8 04/23/2022    DG Knee Complete 4 Views Left  Result Date: 01/30/2022 CLINICAL DATA:  Trauma to the left knee. EXAM: LEFT KNEE - COMPLETE 4+ VIEW COMPARISON:  Knee radiograph dated 09/11/2018. FINDINGS: No acute fracture or dislocation. Relatively stable mild arthritic changes of the knee compared to 2020. no joint effusion. The soft tissues are unremarkable IMPRESSION: No acute fracture or dislocation. Electronically Signed   By: Anner Crete M.D.   On: 01/30/2022 19:43    Assessment & Plan:   Prestyn was seen today for annual exam, hypertension, hyperlipidemia, copd and diabetes.  Diagnoses and all orders for this visit:  Essential hypertension, benign- Her blood pressure is adequately well controlled -     CBC with Differential/Platelet; Future -     Hepatic function panel; Future -     TSH; Future -     Urinalysis, Routine w reflex microscopic; Future -     Basic metabolic panel; Future -     Basic metabolic panel -     Urinalysis, Routine w reflex microscopic -     TSH -     Hepatic function panel -     CBC with Differential/Platelet -     potassium chloride (KLOR-CON 10) 10 MEQ tablet; Take 1 tablet (10 mEq total) by mouth 3 (three) times daily.  Type 2 diabetes mellitus with hyperlipidemia (HCC) - Her blood sugar is well controlled -     Microalbumin / creatinine urine ratio; Future -     Hemoglobin A1c; Future -     Basic metabolic panel; Future -     HM Diabetes Foot Exam -     Basic metabolic panel -     Hemoglobin A1c -     Microalbumin / creatinine urine ratio  Hyperlipidemia with target LDL less than 100- LDL goal achieved. Doing well on the  statin  -     Lipid panel; Future -     Hepatic function panel; Future -     TSH; Future -     TSH -     Hepatic function panel -     Lipid panel  Encounter for general adult medical examination with abnormal findings- Exam completed, labs reviewed, vaccines reviewed and updated, cancer screenings are up-to-date, patient education was given.  Hyperlipidemia LDL goal <100 -     rosuvastatin (CRESTOR) 10 MG tablet; Take 1 tablet (10 mg total) by mouth daily.  Diuretic-induced hypokalemia -     potassium chloride (KLOR-CON 10) 10 MEQ tablet; Take 1 tablet (10 mEq total) by mouth 3 (three) times daily.  Mild persistent asthma without complication -     Fluticasone-Umeclidin-Vilant (TRELEGY ELLIPTA) 200-62.5-25 MCG/ACT AEPB; Inhale 1 puff into the lungs daily.  Other orders -     Flu Vaccine QUAD 6+ mos PF IM (Fluarix Quad PF) -     LDL cholesterol, direct   I have discontinued Joaquim Lai L. Rozo's Biotin, Trulance, pramoxine-hydrocortisone, and indapamide. I have also changed her rosuvastatin. Additionally, I am having her maintain her Cyanocobalamin (VITAMIN B 12 PO), gabapentin, Vitamin D3, Blood Glucose System Pak, Lancets, BLOOD GLUCOSE TEST STRIPS, polyethylene glycol, hydrocortisone, cloNIDine, Trulicity, cyclobenzaprine, metoprolol succinate, hydrOXYzine, mirtazapine, oxyCODONE-acetaminophen, meloxicam, fluconazole, potassium chloride, and Trelegy Ellipta.  Meds ordered this encounter  Medications   rosuvastatin (CRESTOR) 10 MG tablet    Sig: Take 1 tablet (10 mg total) by mouth daily.    Dispense:  90 tablet  Refill:  1   potassium chloride (KLOR-CON 10) 10 MEQ tablet    Sig: Take 1 tablet (10 mEq total) by mouth 3 (three) times daily.    Dispense:  270 tablet    Refill:  0   Fluticasone-Umeclidin-Vilant (TRELEGY ELLIPTA) 200-62.5-25 MCG/ACT AEPB    Sig: Inhale 1 puff into the lungs daily.    Dispense:  120 each    Refill:  1     Follow-up: Return in about 3 months  (around 07/24/2022).  Scarlette Calico, MD

## 2022-04-24 ENCOUNTER — Encounter: Payer: Self-pay | Admitting: Internal Medicine

## 2022-04-24 NOTE — Patient Instructions (Signed)

## 2022-04-24 NOTE — Progress Notes (Unsigned)
Subjective:   Melody Jenkins is a 56 y.o. female who presents for Medicare Annual (Subsequent) preventive examination. I connected with  [name] on 04/25/22 by a audio enabled telemedicine application and verified that I am speaking with the correct person using two identifiers.  Patient Location: Home  Provider Location: Home Office  I discussed the limitations of evaluation and management by telemedicine. The patient expressed understanding and agreed to proceed.  Review of Systems    Deferred to PCP Cardiac Risk Factors include: advanced age (>63mn, >>74women);diabetes mellitus;dyslipidemia;hypertension;sedentary lifestyle;obesity (BMI >30kg/m2)     Objective:    Today's Vitals   04/25/22 1010  PainSc: 2    There is no height or weight on file to calculate BMI.     04/25/2022   10:31 AM 04/24/2021    9:58 AM 01/13/2020    8:09 AM 10/30/2019    8:19 AM 09/24/2018    3:26 PM 09/05/2017    2:51 PM 08/01/2017   11:44 AM  Advanced Directives  Does Patient Have a Medical Advance Directive? No Yes _0   Type of Advance Directive  [place]      Does patient want to make changes to medical advance directive?  Yes (MAU/Ambulatory/Procedural Areas - Information given)       Copy of HTescottin Chart?  No - copy requested       Would patient like information on creating a medical advance directive? No - Patient declined  Yes (MAU/Ambulatory/Procedural Areas - Information given) No - Patient declined Yes (ED - Information included in AVS)  No - Patient declined    Current Medications (verified) Outpatient Encounter Medications as of 04/25/2022  Medication Sig   Blood Glucose Monitoring Suppl (BLOOD GLUCOSE SYSTEM PAK) KIT Please dispense based on patient and insurance preference. Use as directed to monitor FSBS 2x daily. Dx: E11.9   Cholecalciferol (VITAMIN D3) 25 MCG (1000 UT) CAPS Take by mouth 2 (two) times daily.    cloNIDine (CATAPRES) 0.2 MG tablet TAKE 1 TABLET TWICE DAILY   Cyanocobalamin (VITAMIN B 12 PO) Take 1 tablet by mouth daily.    cyclobenzaprine (FLEXERIL) 5 MG tablet Take 1 tablet (5 mg total) by mouth 3 (three) times daily as needed for muscle spasms.   Fluticasone-Umeclidin-Vilant (TRELEGY ELLIPTA) 200-62.5-25 MCG/ACT AEPB Inhale 1 puff into the lungs daily.   gabapentin (NEURONTIN) 300 MG capsule Take 600 mg by mouth 3 (three) times daily.   Glucose Blood (BLOOD GLUCOSE TEST STRIPS) STRP Please dispense based on patient and insurance preference. Use as directed to monitor FSBS 2x daily. Dx: E11.9   hydrocortisone 2.5 % ointment Apply topically 2 (two) times daily. To affected areas on face   hydrOXYzine (ATARAX) 50 MG tablet Take 1 tablet (50 mg total) by mouth 3 (three) times daily.   Lancets MISC Please dispense based on patient and insurance preference. Use as directed to monitor FSBS 2x daily. Dx: E11.9   meloxicam (MOBIC) 7.5 MG tablet TAKE 1 TABLET(7.5 MG) BY MOUTH DAILY   metoprolol succinate (TOPROL-XL) 100 MG 24 hr tablet TAKE 1 TABLET EVERY DAY WITH OR IMMEDIATELY FOLLOWING A MEAL   mirtazapine (REMERON) 45 MG tablet Take 1 tablet (45 mg total) by mouth at bedtime.   oxyCODONE-acetaminophen (PERCOCET) 5-325 MG tablet take 1 tablet by oral route  every 8 hours as needed.   polyethylene glycol (MIRALAX) 17 g packet Take 17 g by mouth 2 (  two) times daily.   potassium chloride (KLOR-CON 10) 10 MEQ tablet Take 1 tablet (10 mEq total) by mouth 3 (three) times daily.   rosuvastatin (CRESTOR) 10 MG tablet Take 1 tablet (10 mg total) by mouth daily.   fluconazole (DIFLUCAN) 150 MG tablet Take 1 tablet (150 mg total) by mouth every 3 (three) days. (Patient not taking: Reported on 94/17/4081)   TRULICITY 4.48 JE/5.6DJ SOPN INJECT 0.75MG (1 PEN) UNDER THE SKIN EVERY WEEK (Patient not taking: Reported on 04/25/2022)   No facility-administered encounter medications on file as of 04/25/2022.     Allergies (verified) Trazodone and nefazodone and Tape   History: Past Medical History:  Diagnosis Date   Anemia    Anxiety    Arthritis    Asthma    Carpal tunnel syndrome    Chronic back pain    Depression    Diabetes mellitus without complication (HCC)    Essential hypertension    Heart murmur    History of diabetes mellitus    History of multiple miscarriages    Hyperlipidemia    Torn rotator cuff    Past Surgical History:  Procedure Laterality Date   BREAST SURGERY  2000   reduction   CARPAL TUNNEL RELEASE     left   CESAREAN SECTION     COSMETIC SURGERY  2000   breast reduction   REDUCTION MAMMAPLASTY Bilateral    ROTATOR CUFF REPAIR  10/2019   Family History  Problem Relation Age of Onset   Diabetes Mother    Hypertension Mother    Breast cancer Mother 42   Hypertension Father    Heart disease Father    Early death Brother    Kidney disease Maternal Grandmother    Heart disease Brother    Colon cancer Neg Hx    Rectal cancer Neg Hx    Esophageal cancer Neg Hx    Pancreatic cancer Neg Hx    Liver disease Neg Hx    Stomach cancer Neg Hx    Social History   Socioeconomic History   Marital status: Single    Spouse name: Not on file   Number of children: 1   Years of education: Not on file   Highest education level: Not on file  Occupational History   Occupation: disablity  Tobacco Use   Smoking status: Every Day    Packs/day: 0.50    Years: 30.00    Total pack years: 15.00    Types: Cigarettes   Smokeless tobacco: Never  Vaping Use   Vaping Use: Never used  Substance and Sexual Activity   Alcohol use: No   Drug use: No   Sexual activity: Not Currently    Partners: Male    Birth control/protection: Post-menopausal  Other Topics Concern   Not on file  Social History Narrative   Not on file   Social Determinants of Health   Financial Resource Strain: Low Risk  (04/24/2021)   Overall Financial Resource Strain (CARDIA)     Difficulty of Paying Living Expenses: Not hard at all  Food Insecurity: No Food Insecurity (04/25/2022)   Hunger Vital Sign    Worried About Running Out of Food in the Last Year: Never true    Ran Out of Food in the Last Year: Never true  Transportation Needs: No Transportation Needs (04/25/2022)   PRAPARE - Hydrologist (Medical): No    Lack of Transportation (Non-Medical): No  Physical Activity: Inactive (04/25/2022)  Exercise Vital Sign    Days of Exercise per Week: 0 days    Minutes of Exercise per Session: 0 min  Stress: Stress Concern Present (04/25/2022)   [place]    Feeling of Stress : Very much  Social Connections: Socially Isolated (04/25/2022)   Social Connection and Isolation Panel [NHANES]    Frequency of Communication with Friends and Family: More than three times a week    Frequency of Social Gatherings with Friends and Family: Twice a week    Attends Religious Services: Never    Marine scientist or Organizations: No    Attends Music therapist: Never    Marital Status: Never married    Tobacco Counseling Ready to quit: No Counseling given: Not Answered   Clinical Intake:  Pre-visit preparation completed: Yes  Pain : 0-10 Pain Score: 2  Pain Type: Chronic pain Pain Location: Back Pain Orientation: Lower Pain Descriptors / Indicators: Discomfort, Dull, Aching Pain Relieving Factors: medication  Pain Relieving Factors: medication  Nutritional Status: BMI > 30  Obese Nutritional Risks: Other (Comment) (poor appetite; discussed supplements) Diabetes: Yes CBG done?: No (phone visit) Did pt. bring in CBG monitor from home?: No (phone visit)  How often do you need to have someone help you when you read instructions, pamphlets, or other written materials from your doctor or pharmacy?: 1 - Never What is the last grade level you completed in  school?: 12th  Diabetic?Yes Nutrition Risk Assessment:  Has the patient had any N/V/D within the last 2 months?  No  Does the patient have any non-healing wounds?  No  Has the patient had any unintentional weight loss or weight gain?  No   Diabetes:  Is the patient diabetic?  Yes  If diabetic, was a CBG obtained today?  No , phone visit Did the patient bring in their glucometer from home?  No , phone visit How often do you monitor your CBG's? occasionally.   Financial Strains and Diabetes Management:  Are you having any financial strains with the device, your supplies or your medication? No .  Does the patient want to be seen by Chronic Care Management for management of their diabetes?  No  Would the patient like to be referred to a Nutritionist or for Diabetic Management?  No   Diabetic Exams:  Diabetic Eye Exam: Completed 05/01/21 Diabetic Foot Exam: Completed 04/23/22   Interpreter Needed?: No  Information entered by :: Emelia Loron RN   Activities of Daily Living    04/25/2022   10:27 AM  In your present state of health, do you have any difficulty performing the following activities:  Hearing? 0  Vision? 0  Difficulty concentrating or making decisions? 0  Walking or climbing stairs? 0  Dressing or bathing? 0  Doing errands, shopping? 0  Preparing Food and eating ? N  Using the Toilet? N  In the past six months, have you accidently leaked urine? N  Do you have problems with loss of bowel control? N  Managing your Medications? N  Managing your Finances? N  Housekeeping or managing your Housekeeping? N    Patient Care Team: Janith Lima, MD as PCP - General (Internal Medicine) Freada Bergeron, MD as PCP - Cardiology (Cardiology) Marlaine Hind, MD as Consulting Physician (Physical Medicine and Rehabilitation) Salvadore Dom, MD as Consulting Physician (Obstetrics and Gynecology) Laurence Aly, OD as Consulting Physician (Optometry)  Indicate any  recent  Medical Services you may have received from other than [facility] providers in the past year (date may be approximate).     Assessment:   This is a routine wellness examination for Ondrea.  Hearing/Vision screen No results found.  Dietary issues and exercise activities discussed: Current Exercise Habits: The patient does not participate in regular exercise at present, Exercise limited by: orthopedic condition(s);Other - see comments (pain)   Goals Addressed             This Visit's Progress    Patient Stated       Improve my health to be able to decrease the amount of medications I take.       Depression Screen    04/25/2022   10:14 AM 01/04/2022    9:43 AM 08/24/2021   10:16 AM 07/27/2021   11:03 AM 04/24/2021   10:53 AM 10/27/2020   10:22 AM 09/08/2020   10:07 AM  PHQ 2/9 Scores  PHQ - 2 Score 4    0    PHQ- 9 Score 14           Information is confidential and restricted. Go to Review Flowsheets to unlock data.    Fall Risk    04/25/2022   10:32 AM 04/24/2021   10:06 AM 01/13/2020    8:05 AM 08/25/2019    8:06 AM 06/22/2019    8:15 AM  Fall Risk   Falls in the past year? 1 0 0 0 0  Number falls in past yr: 0 0     Injury with Fall? 0 0     Risk for fall due to : History of fall(s);Impaired balance/gait;Impaired mobility;Impaired vision;Medication side effect No Fall Risks Impaired balance/gait;Impaired mobility;Orthopedic patient Impaired balance/gait History of fall(s);Impaired balance/gait  Follow up Education provided;_0     FALL RISK PREVENTION PERTAINING TO THE HOME:  Any stairs in or around the home? Yes  If so, are there any without handrails? Yes  Home free of loose throw rugs in walkways, pet beds, electrical cords, etc? Yes  Adequate lighting in your home to reduce risk of falls? Yes   ASSISTIVE DEVICES UTILIZED TO  PREVENT FALLS:  Life alert? No  Use of a cane, walker or w/c? Yes  Grab bars in the bathroom? Yes  Shower chair or bench in shower? Yes  Elevated toilet seat or a handicapped toilet? Yes   Cognitive Function:        04/25/2022   10:33 AM  6CIT Screen  What Year? 0 points  What month? 0 points  What time? 0 points  Count back from 20 0 points  Months in reverse 2 points  Repeat phrase 0 points  Total Score 2 points    Immunizations Immunization History  Administered Date(s) Administered   Influenza,inj,Quad PF,6+ Mos 04/29/2018, 02/20/2019, 05/16/2020, 04/05/2021, 04/23/2022   Influenza-Unspecified 01/11/2018   Moderna Sars-Covid-2 Vaccination 12/10/2019, 01/05/2020   PNEUMOCOCCAL CONJUGATE-20 04/05/2021   Pneumococcal Polysaccharide-23 04/06/2015   Tdap 02/20/2019    TDAP status: Up to date  Flu Vaccine status: Up to date  Covid-19 vaccine status: Information provided on how to obtain vaccines.   Qualifies for Shingles Vaccine? Yes   Zostavax completed No   Shingrix Completed?: No.    Education has been provided regarding the importance of this vaccine. Patient has been advised to call insurance company to determine out of pocket expense if they have not yet received  this vaccine. Advised may also receive vaccine at local pharmacy or Health Dept. Verbalized acceptance and understanding.  Screening Tests Health Maintenance  Topic Date Due   Zoster Vaccines- Shingrix (1 of 2) Never done   OPHTHALMOLOGY EXAM  05/01/2022   HEMOGLOBIN A1C  10/22/2022   PAP SMEAR-Modifier  01/28/2023   Diabetic kidney evaluation - GFR measurement  04/24/2023   Diabetic kidney evaluation - Urine ACR  04/24/2023   FOOT EXAM  04/24/2023   Medicare Annual Wellness (AWV)  04/26/2023   MAMMOGRAM  10/12/2023   COLONOSCOPY (Pts 45-7yr Insurance coverage will need to be confirmed)  11/30/2025   TETANUS/TDAP  02/19/2029   INFLUENZA VACCINE  Completed   Hepatitis C Screening  Completed    HIV Screening  Completed   HPV VACCINES  Aged Out   COVID-19 Vaccine  Discontinued    Health Maintenance  Health Maintenance Due  Topic Date Due   Zoster Vaccines- Shingrix (1 of 2) Never done    Colorectal cancer screening: Type of screening: Colonoscopy. Completed 11/30/20. Repeat every 5 years  Mammogram status: Completed 10/11/21. Repeat every year  Lung Cancer Screening: (Low Dose CT Chest recommended if Age 57-80years, 30 pack-year currently smoking OR have quit w/in 15years.) does qualify.   Lung Cancer Screening Referral: Deferred to PCP  Additional Screening:  Hepatitis C Screening: does qualify; Completed 04/06/15  Vision Screening: Recommended annual ophthalmology exams for early detection of glaucoma and other disorders of the eye. Is the patient up to date with their annual eye exam?  Yes  Who is the provider or what is the name of the office in which the patient attends annual eye exams? Dr. MEinar GipIf pt is not established with a provider, would they like to be referred to a provider to establish care?  N/A .   Dental Screening: Recommended annual dental exams for proper oral hygiene  Community Resource Referral / Chronic Care Management: CRR required this visit?  No   CCM required this visit?  No      Plan:     I have personally reviewed and noted the following in the patient's chart:   Medical and social history Use of alcohol, tobacco or illicit drugs  Current medications and supplements including opioid prescriptions. Patient is not currently taking opioid prescriptions. Functional ability and status Nutritional status Physical activity Advanced directives List of other physicians Hospitalizations, surgeries, and ER visits in previous 12 months Vitals Screenings to include cognitive, depression, and falls Referrals and appointments  In addition, I have reviewed and discussed with patient certain preventive protocols, quality metrics, and best  practice recommendations. A written personalized care plan for preventive services as well as general preventive health recommendations were provided to patient.     JMichiel Cowboy RN   04/25/2022   Nurse Notes:  Ms. SMcconathy, Thank you for taking time to come for your Medicare Wellness Visit. I appreciate your ongoing commitment to your health goals. Please review the following plan we discussed and let me know if I can assist you in the future.   These are the goals we discussed:  Goals      Patient Stated     Improve my health to be able to decrease the amount of medications I take.         This is a list of the screening recommended for you and due dates:  Health Maintenance  Topic Date Due   Zoster (Shingles) Vaccine (1 of 2)  Never done   Eye exam for diabetics  05/01/2022   Hemoglobin A1C  10/22/2022   Pap Smear  01/28/2023   Yearly kidney function blood test for diabetes  04/24/2023   Yearly kidney health urinalysis for diabetes  04/24/2023   Complete foot exam   04/24/2023   Medicare Annual Wellness Visit  04/26/2023   Mammogram  10/12/2023   Colon Cancer Screening  11/30/2025   Tetanus Vaccine  02/19/2029   Flu Shot  Completed   Hepatitis C Screening: USPSTF Recommendation to screen - Ages 18-79 yo.  Completed   HIV Screening  Completed   HPV Vaccine  Aged Out   COVID-19 Vaccine  Discontinued

## 2022-04-25 ENCOUNTER — Ambulatory Visit (INDEPENDENT_AMBULATORY_CARE_PROVIDER_SITE_OTHER): Payer: Medicare HMO | Admitting: *Deleted

## 2022-04-25 DIAGNOSIS — Z0000 Encounter for general adult medical examination without abnormal findings: Secondary | ICD-10-CM | POA: Diagnosis not present

## 2022-04-27 ENCOUNTER — Encounter: Payer: Self-pay | Admitting: Internal Medicine

## 2022-04-29 DIAGNOSIS — E785 Hyperlipidemia, unspecified: Secondary | ICD-10-CM | POA: Insufficient documentation

## 2022-04-29 DIAGNOSIS — L700 Acne vulgaris: Secondary | ICD-10-CM | POA: Insufficient documentation

## 2022-04-29 MED ORDER — SULFAMETHOXAZOLE-TRIMETHOPRIM 800-160 MG PO TABS: 1.0000 | ORAL_TABLET | Freq: Two times a day (BID) | ORAL | 0 refills | Status: AC

## 2022-04-29 MED ORDER — SHINGRIX 50 MCG/0.5ML IM SUSR: 0.5000 mL | Freq: Once | INTRAMUSCULAR | 1 refills | Status: AC

## 2022-05-08 ENCOUNTER — Ambulatory Visit (HOSPITAL_COMMUNITY)
Admission: EM | Admit: 2022-05-08 | Discharge: 2022-05-08 | Disposition: A | Discharge status: Home / Self Care | Payer: Medicare HMO | Attending: Internal Medicine | Admitting: Internal Medicine

## 2022-05-08 ENCOUNTER — Encounter (HOSPITAL_COMMUNITY): Payer: Self-pay | Admitting: Emergency Medicine

## 2022-05-08 DIAGNOSIS — L989 Disorder of the skin and subcutaneous tissue, unspecified: Secondary | ICD-10-CM

## 2022-05-08 MED ORDER — LIDOCAINE HCL 2 % EX GEL: 1.0000 | CUTANEOUS | 0 refills | Status: DC | PRN

## 2022-05-08 NOTE — ED Triage Notes (Signed)
Pt had cyst on right side of face since September. Seen doctors and taken antibiotics and not any better. Is painful. Not getting drainage out of area.

## 2022-05-08 NOTE — ED Provider Notes (Signed)
[place]    CSN: [id number] Arrival date & time: 05/08/22  1431      History   Chief Complaint Chief Complaint  Patient presents with   Abscess    HPI Melody Jenkins is a 56 y.o. female comes to the urgent care with painful swelling over the right side of the face.  Patient has had the swelling over the past couple of months.  She is completed 3 courses of antibiotics including Bactrim which she recently completed.  The lesion was first cystic but she has been trying to squeezing it and at times she gets some thick discharge from it.  Currently no fever or chills.  No surrounding erythema.  No gum pain or tooth pain.  No known relieving factors.  Patient is scheduled to see a dermatologist in January 2024.   HPI  Past Medical History:  Diagnosis Date   Anemia    Anxiety    Arthritis    Asthma    Carpal tunnel syndrome    Chronic back pain    Depression    Diabetes mellitus without complication ([place])    Essential hypertension    Heart murmur    History of diabetes mellitus    History of multiple miscarriages    Hyperlipidemia    Torn rotator cuff     Patient Active Problem List   Diagnosis Date Noted   Cystic acne vulgaris 04/29/2022   Hyperlipidemia LDL goal <100 04/29/2022   Cystic acne 03/23/2022   Mild persistent asthma without complication 57/84/6962   Hyperlipidemia with target LDL less than 100 04/05/2021   Diuretic-induced hypokalemia 12/27/2020   Encounter for general adult medical examination with abnormal findings 08/17/2020   Therapeutic drug monitoring 08/17/2020   Prolonged Q-T interval on ECG 08/17/2020   Rotator cuff arthropathy of right shoulder 04/20/2019   Arthritis of carpometacarpal (CMC) joint of left thumb 11/20/2018   Vitamin D deficiency 11/19/2018   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 11/11/2018   Chronic back pain    Lumbar radiculopathy 06/07/2017   Carpal tunnel syndrome  04/09/2017   Type 2 diabetes mellitus with hyperlipidemia ([place]) 02/13/2017   Chronic constipation 11/20/2016   Lumbar spondylosis 06/19/2016   Inflammation of sacroiliac joint ([place]) 04/26/2016   Anterolisthesis 04/06/2016   Opioid dependence ([place]) 03/20/2016   Situational mixed anxiety and depressive disorder 02/26/2016   Insomnia 02/24/2016   Essential hypertension, benign 12/26/2015   DDD (degenerative disc disease), lumbar 12/26/2015   Obesity 04/06/2015   Asthma     Past Surgical History:  Procedure Laterality Date   BREAST SURGERY  2000   reduction   CARPAL TUNNEL RELEASE     left   CESAREAN SECTION     COSMETIC SURGERY  2000   breast reduction   REDUCTION MAMMAPLASTY Bilateral    ROTATOR CUFF REPAIR  10/2019    OB History     Gravida  5   Para  1   Term  1   Preterm      AB  4   Living  1      SAB  3   IAB      Ectopic  1   Multiple      Live Births  1            Home Medications    Prior to Admission medications   Medication Sig Start Date End Date Taking? Authorizing Provider  lidocaine (XYLOCAINE) 2 %  jelly Apply 1 Application topically as needed. 05/08/22  Yes Lidia Clavijo, Myrene Galas, MD  Blood Glucose Monitoring Suppl (BLOOD GLUCOSE SYSTEM PAK) KIT Please dispense based on patient and insurance preference. Use as directed to monitor FSBS 2x daily. Dx: E11.9 05/19/20   Alycia Rossetti, MD  Cholecalciferol (VITAMIN D3) 25 MCG (1000 UT) CAPS Take by mouth 2 (two) times daily.    [provider]  cloNIDine (CATAPRES) 0.2 MG tablet TAKE 1 TABLET TWICE DAILY 04/18/21   Janith Lima, MD  Cyanocobalamin (VITAMIN B 12 PO) Take 1 tablet by mouth daily.     [provider]  cyclobenzaprine (FLEXERIL) 5 MG tablet Take 1 tablet (5 mg total) by mouth 3 (three) times daily as needed for muscle spasms. 12/04/21   Janith Lima, MD  Fluticasone-Umeclidin-Vilant (TRELEGY ELLIPTA) 200-62.5-25 MCG/ACT AEPB Inhale 1 puff into the lungs  daily. 04/23/22   Janith Lima, MD  gabapentin (NEURONTIN) 300 MG capsule Take 600 mg by mouth 3 (three) times daily.    [provider]  Glucose Blood (BLOOD GLUCOSE TEST STRIPS) STRP Please dispense based on patient and insurance preference. Use as directed to monitor FSBS 2x daily. Dx: E11.9 05/19/20   Alycia Rossetti, MD  hydrocortisone 2.5 % ointment Apply topically 2 (two) times daily. To affected areas on face 12/27/20   Janith Lima, MD  hydrOXYzine (ATARAX) 50 MG tablet Take 1 tablet (50 mg total) by mouth 3 (three) times daily. 01/04/22   Charlcie Cradle, MD  Lancets MISC Please dispense based on patient and insurance preference. Use as directed to monitor FSBS 2x daily. Dx: E11.9 05/19/20   Alycia Rossetti, MD  meloxicam (MOBIC) 7.5 MG tablet TAKE 1 TABLET(7.5 MG) BY MOUTH DAILY 03/02/22   Janith Lima, MD  metoprolol succinate (TOPROL-XL) 100 MG 24 hr tablet TAKE 1 TABLET EVERY DAY WITH OR IMMEDIATELY FOLLOWING A MEAL 12/18/21   Janith Lima, MD  mirtazapine (REMERON) 45 MG tablet Take 1 tablet (45 mg total) by mouth at bedtime. 01/04/22   Charlcie Cradle, MD  oxyCODONE-acetaminophen (PERCOCET) 5-325 MG tablet take 1 tablet by oral route  every 8 hours as needed. 09/12/21   [provider]  polyethylene glycol (MIRALAX) 17 g packet Take 17 g by mouth 2 (two) times daily. 09/06/20   Armbruster, Carlota Raspberry, MD  potassium chloride (KLOR-CON 10) 10 MEQ tablet Take 1 tablet (10 mEq total) by mouth 3 (three) times daily. 04/23/22   Janith Lima, MD  rosuvastatin (CRESTOR) 10 MG tablet Take 1 tablet (10 mg total) by mouth daily. 04/23/22   Janith Lima, MD    Family History Family History  Problem Relation Age of Onset   Diabetes Mother    Hypertension Mother    Breast cancer Mother 4   Hypertension Father    Heart disease Father    Early death Brother    Kidney disease Maternal Grandmother    Heart disease Brother    Colon cancer Neg Hx    Rectal cancer  Neg Hx    Esophageal cancer Neg Hx    Pancreatic cancer Neg Hx    Liver disease Neg Hx    Stomach cancer Neg Hx     Social History Social History   Tobacco Use   Smoking status: Every Day    Packs/day: 0.50    Years: 30.00    Total pack years: 15.00    Types: Cigarettes   Smokeless tobacco: Never  Vaping Use   Vaping Use: Never used  Substance Use Topics   Alcohol use: No   Drug use: No     Allergies   Trazodone and nefazodone and Tape   Review of Systems Review of Systems  Skin:  Negative for color change, pallor, rash and wound.  Neurological: Negative.      Physical Exam Triage Vital Signs ED Triage Vitals  Enc Vitals Group     BP 05/08/22 1552 114/77     Pulse Rate 05/08/22 1552 70     Resp 05/08/22 1552 17     Temp 05/08/22 1552 (!) 97.5 F (36.4 C)     Temp Source 05/08/22 1552 Oral     SpO2 05/08/22 1552 98 %     Weight --      Height --      Head Circumference --      Peak Flow --      Pain Score 05/08/22 1551 8     Pain Loc --      Pain Edu? --      Excl. in [place]? --    No data found.  Updated Vital Signs BP 114/77 (BP Location: Left Arm)   Pulse 70   Temp (!) 97.5 F (36.4 C) (Oral)   Resp 17   LMP 12/17/2017   SpO2 98%   Visual Acuity Right Eye Distance:   Left Eye Distance:   Bilateral Distance:    Right Eye Near:   Left Eye Near:    Bilateral Near:     Physical Exam Vitals and nursing note reviewed.  Constitutional:      General: She is not in acute distress.    Appearance: She is not ill-appearing.  Musculoskeletal:        General: Normal range of motion.  Skin:    General: Skin is warm.     Comments: Epidermoid cyst over the right side of the face.  No surrounding erythema.  No fluctuance noted.  No discharge noted.  Neurological:     Mental Status: She is alert.      UC Treatments / Results  Labs (all labs ordered are listed, but only abnormal results are displayed) Labs Reviewed - No data to  display  EKG   Radiology No results found.  Procedures Procedures (including critical care time)  Medications Ordered in UC Medications - No data to display  Initial Impression / Assessment and Plan / UC Course  I have reviewed the triage vital signs and the nursing notes.  Pertinent labs & imaging results that were available during my care of the patient were reviewed by me and considered in my medical decision making (see chart for details).     1.  Epidermoid cyst over the right side of the face. Patient is advised to follow-up with the dermatologist for excision of the cystic lesion. No indication for antibiotics at this time Lidocaine gel as needed for superficial pain or discomfort Return precautions given. Final Clinical Impressions(s) / UC Diagnoses   Final diagnoses:  Facial skin lesion     Discharge Instructions      Please apply lidocaine gel as needed Keep your dermatology appointment. You will benefit from excision of the facial lesion but that will be best done in the dermatologist's office. If you notice any redness, worsening swelling or purulent discharge please return to urgent care to be reevaluated. At this time the lesion does not look infected.   ED Prescriptions  Medication Sig Dispense Auth. Provider   lidocaine (XYLOCAINE) 2 % jelly Apply 1 Application topically as needed. 30 mL Thayne Cindric, Myrene Galas, MD      PDMP not reviewed this encounter.   Chase Picket, MD 05/08/22 [phone]

## 2022-05-08 NOTE — Discharge Instructions (Addendum)
Please apply lidocaine gel as needed Keep your dermatology appointment. You will benefit from excision of the facial lesion but that will be best done in the dermatologist's office. If you notice any redness, worsening swelling or purulent discharge please return to urgent care to be reevaluated. At this time the lesion does not look infected.

## 2022-05-09 ENCOUNTER — Telehealth (HOSPITAL_COMMUNITY): Payer: Self-pay

## 2022-05-09 MED ORDER — LIDOCAINE HCL 2 % EX GEL: 1.0000 | Freq: Three times a day (TID) | CUTANEOUS | 0 refills | Status: DC | PRN

## 2022-05-09 MED ORDER — LIDOCAINE HCL 2 % EX GEL: 1.0000 | CUTANEOUS | 0 refills | Status: DC | PRN

## 2022-05-09 NOTE — Telephone Encounter (Signed)
Pharmacy calling in wanting a frequency for the PRN lidocaine. Verbal from provider to be 3 times daily prn.

## 2022-05-09 NOTE — Telephone Encounter (Signed)
Pharmacy calling in states they will not fill the lidocaine for the face without instructions for how to apply the ointment to the face.   Spoke with Dr Mannie Stabile and was given a verbal of, "Use q-tip to apply a dime size amount of ointment topically to the affected area and rub in."   RX resent with new signature.

## 2022-05-25 ENCOUNTER — Ambulatory Visit: Payer: Medicare HMO

## 2022-05-31 ENCOUNTER — Other Ambulatory Visit: Payer: Self-pay | Admitting: Internal Medicine

## 2022-05-31 DIAGNOSIS — M5136 Other intervertebral disc degeneration, lumbar region: Secondary | ICD-10-CM

## 2022-06-14 ENCOUNTER — Encounter: Payer: Self-pay | Admitting: Internal Medicine

## 2022-06-14 ENCOUNTER — Other Ambulatory Visit: Payer: Self-pay | Admitting: Internal Medicine

## 2022-06-14 DIAGNOSIS — I10 Essential (primary) hypertension: Secondary | ICD-10-CM

## 2022-06-14 DIAGNOSIS — R9431 Abnormal electrocardiogram [ECG] [EKG]: Secondary | ICD-10-CM

## 2022-06-15 DIAGNOSIS — L700 Acne vulgaris: Secondary | ICD-10-CM | POA: Diagnosis not present

## 2022-06-15 MED ORDER — METOPROLOL SUCCINATE ER 100 MG PO TB24: 100.0000 mg | ORAL_TABLET | Freq: Every day | ORAL | 0 refills | Status: DC

## 2022-06-21 DIAGNOSIS — L280 Lichen simplex chronicus: Secondary | ICD-10-CM | POA: Diagnosis not present

## 2022-06-21 DIAGNOSIS — L0201 Cutaneous abscess of face: Secondary | ICD-10-CM | POA: Diagnosis not present

## 2022-06-21 DIAGNOSIS — D485 Neoplasm of uncertain behavior of skin: Secondary | ICD-10-CM | POA: Diagnosis not present

## 2022-06-22 ENCOUNTER — Other Ambulatory Visit: Payer: Self-pay | Admitting: Internal Medicine

## 2022-06-29 ENCOUNTER — Other Ambulatory Visit: Payer: Self-pay | Admitting: Internal Medicine

## 2022-06-29 DIAGNOSIS — G8929 Other chronic pain: Secondary | ICD-10-CM | POA: Diagnosis not present

## 2022-06-29 DIAGNOSIS — F1120 Opioid dependence, uncomplicated: Secondary | ICD-10-CM | POA: Diagnosis not present

## 2022-06-29 DIAGNOSIS — M1711 Unilateral primary osteoarthritis, right knee: Secondary | ICD-10-CM | POA: Diagnosis not present

## 2022-06-29 DIAGNOSIS — E119 Type 2 diabetes mellitus without complications: Secondary | ICD-10-CM | POA: Diagnosis not present

## 2022-06-29 DIAGNOSIS — M545 Low back pain, unspecified: Secondary | ICD-10-CM | POA: Diagnosis not present

## 2022-06-29 DIAGNOSIS — I10 Essential (primary) hypertension: Secondary | ICD-10-CM | POA: Diagnosis not present

## 2022-06-29 DIAGNOSIS — Z79899 Other long term (current) drug therapy: Secondary | ICD-10-CM | POA: Diagnosis not present

## 2022-06-29 DIAGNOSIS — Z6833 Body mass index (BMI) 33.0-33.9, adult: Secondary | ICD-10-CM | POA: Diagnosis not present

## 2022-06-29 DIAGNOSIS — F419 Anxiety disorder, unspecified: Secondary | ICD-10-CM | POA: Diagnosis not present

## 2022-06-29 DIAGNOSIS — F17210 Nicotine dependence, cigarettes, uncomplicated: Secondary | ICD-10-CM | POA: Diagnosis not present

## 2022-06-29 MED ORDER — INDAPAMIDE 1.25 MG PO TABS: 1.2500 mg | ORAL_TABLET | Freq: Every day | ORAL | 0 refills | Status: DC

## 2022-07-05 DIAGNOSIS — Z79899 Other long term (current) drug therapy: Secondary | ICD-10-CM | POA: Diagnosis not present

## 2022-07-19 ENCOUNTER — Telehealth (HOSPITAL_COMMUNITY): Payer: Medicare HMO | Admitting: Psychiatry

## 2022-07-25 ENCOUNTER — Other Ambulatory Visit: Payer: Self-pay | Admitting: Internal Medicine

## 2022-07-25 DIAGNOSIS — I10 Essential (primary) hypertension: Secondary | ICD-10-CM

## 2022-07-25 DIAGNOSIS — E876 Hypokalemia: Secondary | ICD-10-CM

## 2022-08-23 ENCOUNTER — Telehealth (HOSPITAL_BASED_OUTPATIENT_CLINIC_OR_DEPARTMENT_OTHER): Payer: Medicare HMO | Admitting: Psychiatry

## 2022-08-23 DIAGNOSIS — F333 Major depressive disorder, recurrent, severe with psychotic symptoms: Secondary | ICD-10-CM | POA: Diagnosis not present

## 2022-08-23 DIAGNOSIS — F411 Generalized anxiety disorder: Secondary | ICD-10-CM

## 2022-08-23 DIAGNOSIS — F4310 Post-traumatic stress disorder, unspecified: Secondary | ICD-10-CM

## 2022-08-23 DIAGNOSIS — G4701 Insomnia due to medical condition: Secondary | ICD-10-CM

## 2022-08-23 MED ORDER — HYDROXYZINE HCL 50 MG PO TABS: 50.0000 mg | ORAL_TABLET | Freq: Three times a day (TID) | ORAL | 0 refills | Status: DC

## 2022-08-23 MED ORDER — MIRTAZAPINE 45 MG PO TABS: 45.0000 mg | ORAL_TABLET | Freq: Every day | ORAL | 0 refills | Status: DC

## 2022-08-23 MED ORDER — BUSPIRONE HCL 5 MG PO TABS: 5.0000 mg | ORAL_TABLET | Freq: Two times a day (BID) | ORAL | 0 refills | Status: DC

## 2022-08-23 NOTE — Progress Notes (Signed)
Virtual Visit via Video Note  I connected with Melody Jenkins on 08/23/22 at  3:45 PM EDT by  a video enabled telemedicine application and verified that I am speaking with the correct person using two identifiers.  Location: Patient: home Provider: office   I discussed the limitations of evaluation and management by telemedicine and the availability of in person appointments. The patient expressed understanding and agreed to proceed.  History of Present Illness: I last met with Joaquim Lai in 12/2021. Melody Jenkins shares that she has been dealing with facial swelling since September 2023. She was treating it with 5 months of antibiotics. She went to a dermatologists and finally had it cut. It was determined the infection is rare and has no cure. It has been very stressful and she was told she will need plastic surgery. She let a boyfriend move in with her in September but regrets it. They do not get along anymore and it is not healthy for her and wants him out. Melody Jenkins has a heart murmer that she is dealing with. It is all making her depressed. She is depressed and is endorsing anhedonia on  a daily basis for the last couple of months. She is behind on bills and is upset because she was doing so well before. Everything feels out of control. Her sleep and energy are poor. Her appetite is ok. Concentration is fair most days. Hertha is endorsing daily feelings of worthlessness. She has lost all the people who used to support her.  At least 3-4 days a week she has psychomotor retardation. Myca admits that she had passive thoughts of death in July 15, 2022. She denies any since then and denies SI/HI. Her anxiety is very high regarding her health and finances on a daily basis. She has racing thoughts and is always thinking about the worst case scenario. Melody Jenkins doesn't know where to get help. Vistaril does help to calm her down and she is takes it 3 times a day.    Observations/Objective: Psychiatric Specialty  Exam: ROS  Last menstrual period 12/17/2017.There is no height or weight on file to calculate BMI.  General Appearance: Casual and Fairly Groomed  Eye Contact:  Good  Speech:  Clear and Coherent and Normal Rate  Volume:  Normal  Mood:  Anxious and Depressed  Affect:  Congruent, Depressed, and Tearful  Thought Process:  Coherent and Descriptions of Associations: Circumstantial  Orientation:  Full (Time, Place, and Person)  Thought Content:  Rumination  Suicidal Thoughts:  No  Homicidal Thoughts:  No  Memory:  Immediate;   Good  Judgement:  Good  Insight:  Good  Psychomotor Activity:  Normal  Concentration:  Concentration: Fair  Recall:  [place] of Knowledge:  Good  Language:  Good  Akathisia:  No  Handed:  Right  AIMS (if indicated):     Assets:  Communication Skills Desire for Improvement Financial Resources/Insurance Housing Resilience Talents/Skills Transportation Vocational/Educational  ADL's:  Intact  Cognition:  WNL  Sleep:        Assessment and Plan:     08/23/2022    1:39 PM 04/25/2022   10:14 AM 01/04/2022    9:43 AM 08/24/2021   10:16 AM 07/27/2021   11:03 AM  Depression screen PHQ 2/9  Decreased Interest '3 2 2 '$ 0 3  Down, Depressed, Hopeless '3 2 2 1 3  '$ PHQ - 2 Score '6 4 4 1 6  '$ Altered sleeping '3 2 3 2 '$ 0  Tired, decreased energy 3 2 3  2 3  Change in appetite 0 '1 3 3 2  '$ Feeling bad or failure about yourself  '3 2 3 3 3  '$ Trouble concentrating '1 2 3 3 3  '$ Moving slowly or fidgety/restless 1 1 0 0 1  Suicidal thoughts 0 0 0 1 0  PHQ-9 Score '17 14 19 15 18  '$ Difficult doing work/chores Extremely dIfficult Somewhat difficult Very difficult Very difficult Extremely dIfficult    Flowsheet Row Video Visit from 08/23/2022 in [facility] Video Visit from 01/04/2022 in [facility] Video Visit from 08/24/2021 in [facility]  C-SSRS RISK CATEGORY  Error: Q3, 4, or 5 should not be populated when Q2 is No No Risk Error: Q3, 4, or 5 should not be populated when Q2 is No          Pt is aware that these meds carry a teratogenic risk. Pt will discuss plan of action if she does or plans to become pregnant in the future.  Status of current problems: suffering from severe depression and anxiety. She is endorsing insomnia.    Medication management with supportive therapy. Risks and benefits, side effects and alternative treatment options discussed with patient. Pt was given an opportunity to ask questions about medication, illness, and treatment. All current psychiatric medications have been reviewed and discussed with the patient and adjusted as clinically appropriate.  Pt verbalized understanding and verbal consent obtained for treatment.  Meds: start trial of Buspar 1. MDD (major depressive disorder), recurrent, severe, with psychosis ([place]) - mirtazapine (REMERON) 45 MG tablet; Take 1 tablet (45 mg total) by mouth at bedtime.  Dispense: 90 tablet; Refill: 0 - busPIRone (BUSPAR) 5 MG tablet; Take 1 tablet (5 mg total) by mouth 2 (two) times daily.  Dispense: 180 tablet; Refill: 0  2. GAD (generalized anxiety disorder) - hydrOXYzine (ATARAX) 50 MG tablet; Take 1 tablet (50 mg total) by mouth 3 (three) times daily.  Dispense: 270 tablet; Refill: 0 - mirtazapine (REMERON) 45 MG tablet; Take 1 tablet (45 mg total) by mouth at bedtime.  Dispense: 90 tablet; Refill: 0 - busPIRone (BUSPAR) 5 MG tablet; Take 1 tablet (5 mg total) by mouth 2 (two) times daily.  Dispense: 180 tablet; Refill: 0  3. Insomnia due to medical condition - hydrOXYzine (ATARAX) 50 MG tablet; Take 1 tablet (50 mg total) by mouth 3 (three) times daily.  Dispense: 270 tablet; Refill: 0 - mirtazapine (REMERON) 45 MG tablet; Take 1 tablet (45 mg total) by mouth at bedtime.  Dispense: 90 tablet; Refill: 0 - busPIRone (BUSPAR) 5 MG tablet; Take 1 tablet (5 mg total) by mouth 2 (two)  times daily.  Dispense: 180 tablet; Refill: 0  4. PTSD (post-traumatic stress disorder) - mirtazapine (REMERON) 45 MG tablet; Take 1 tablet (45 mg total) by mouth at bedtime.  Dispense: 90 tablet; Refill: 0       Therapy: brief supportive therapy provided. Discussed psychosocial stressors in detail.     Collaboration of Care: Other none  Patient/Guardian was advised Release of Information must be obtained prior to any record release in order to collaborate their care with an outside provider. Patient/Guardian was advised if they have not already done so to contact the registration department to sign all necessary forms in order for [place] to release information regarding their care.   Consent: Patient/Guardian gives verbal consent for treatment and assignment of benefits for services provided during this visit. Patient/Guardian expressed understanding and agreed to  proceed.      Follow Up Instructions: Follow up in 2 months or sooner if needed    I discussed the assessment and treatment plan with the patient. The patient was provided an opportunity to ask questions and all were answered. The patient agreed with the plan and demonstrated an understanding of the instructions.   The patient was advised to call back or seek an in-person evaluation if the symptoms worsen or if the condition fails to improve as anticipated.  I provided 24 minutes of non-face-to-face time during this encounter.   Charlcie Cradle, MD

## 2022-08-25 ENCOUNTER — Other Ambulatory Visit: Payer: Self-pay | Admitting: Internal Medicine

## 2022-08-25 DIAGNOSIS — J4530 Mild persistent asthma, uncomplicated: Secondary | ICD-10-CM

## 2022-08-27 ENCOUNTER — Other Ambulatory Visit: Payer: Self-pay | Admitting: Internal Medicine

## 2022-08-27 DIAGNOSIS — M5136 Other intervertebral disc degeneration, lumbar region: Secondary | ICD-10-CM

## 2022-09-18 ENCOUNTER — Other Ambulatory Visit: Payer: Self-pay | Admitting: Internal Medicine

## 2022-10-04 ENCOUNTER — Other Ambulatory Visit: Payer: Self-pay | Admitting: Internal Medicine

## 2022-10-04 DIAGNOSIS — I10 Essential (primary) hypertension: Secondary | ICD-10-CM

## 2022-10-15 ENCOUNTER — Encounter (HOSPITAL_COMMUNITY): Payer: Self-pay

## 2022-10-25 ENCOUNTER — Telehealth (HOSPITAL_COMMUNITY): Payer: Self-pay | Admitting: Psychiatry

## 2022-10-25 ENCOUNTER — Telehealth (HOSPITAL_COMMUNITY): Payer: Medicare HMO | Admitting: Psychiatry

## 2022-10-25 NOTE — Telephone Encounter (Signed)
Patient was not present on video platform used through [employer]. I called the patient at our scheduled appointment time. It went straight to VM twice. I left a voice message for patient to call the clinic back at their convinence. There was no return phone call during out scheduled visit time. I was not able to speak with the patient today, as they were a no show for their scheduled appointment.

## 2022-10-29 IMAGING — MG MM DIGITAL SCREENING BILAT W/ TOMO AND CAD
6 of 10 series · 6 of 30 positions shown · non-contrast
Comparison: Previous exam(s).

CLINICAL DATA: Screening.

EXAM:
DIGITAL SCREENING BILATERAL MAMMOGRAM WITH TOMOSYNTHESIS AND CAD
TECHNIQUE: Bilateral screening digital craniocaudal and mediolateral oblique
mammograms were obtained. Bilateral screening digital breast
tomosynthesis was performed. The images were evaluated with
computer-aided detection.

[L CC synth-2D]
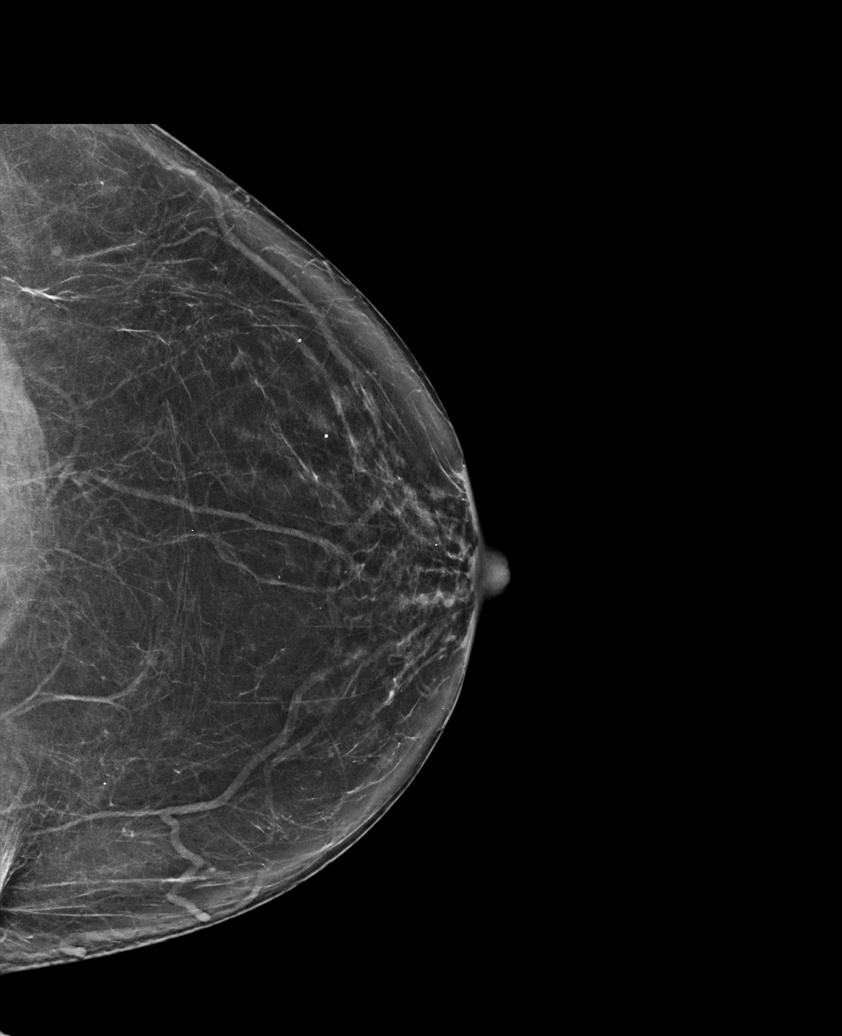

[R CC synth-2D]
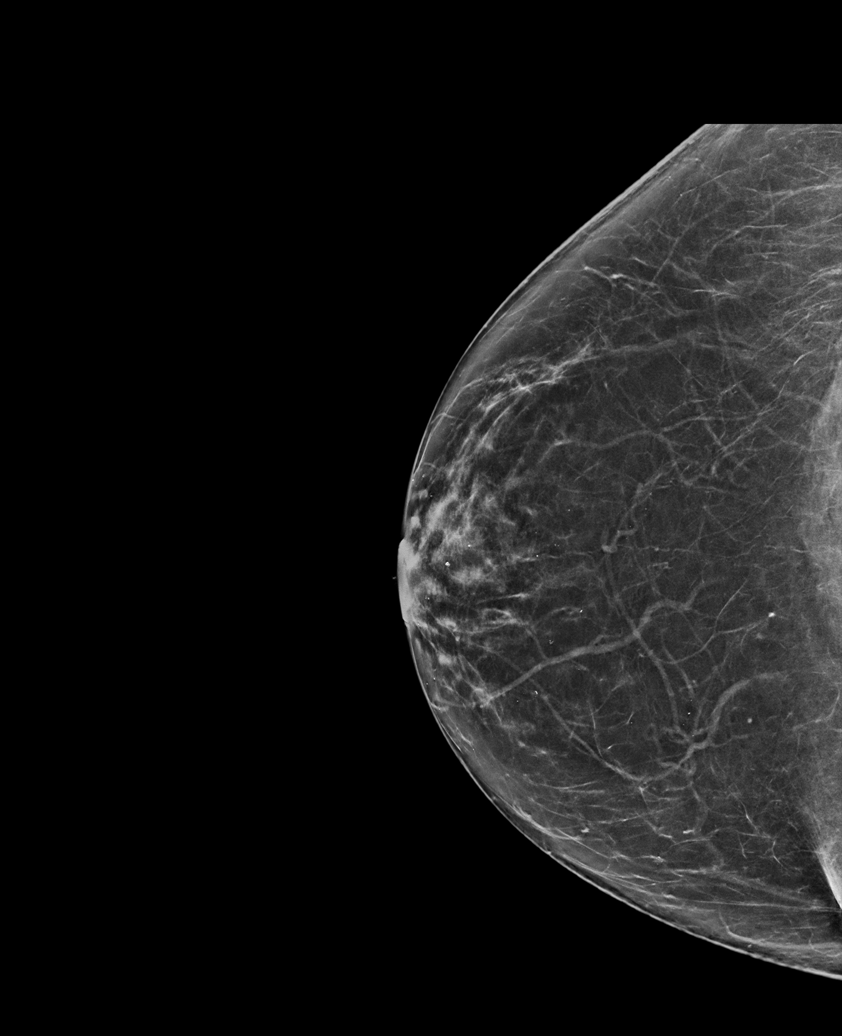

[L MLO synth-2D]
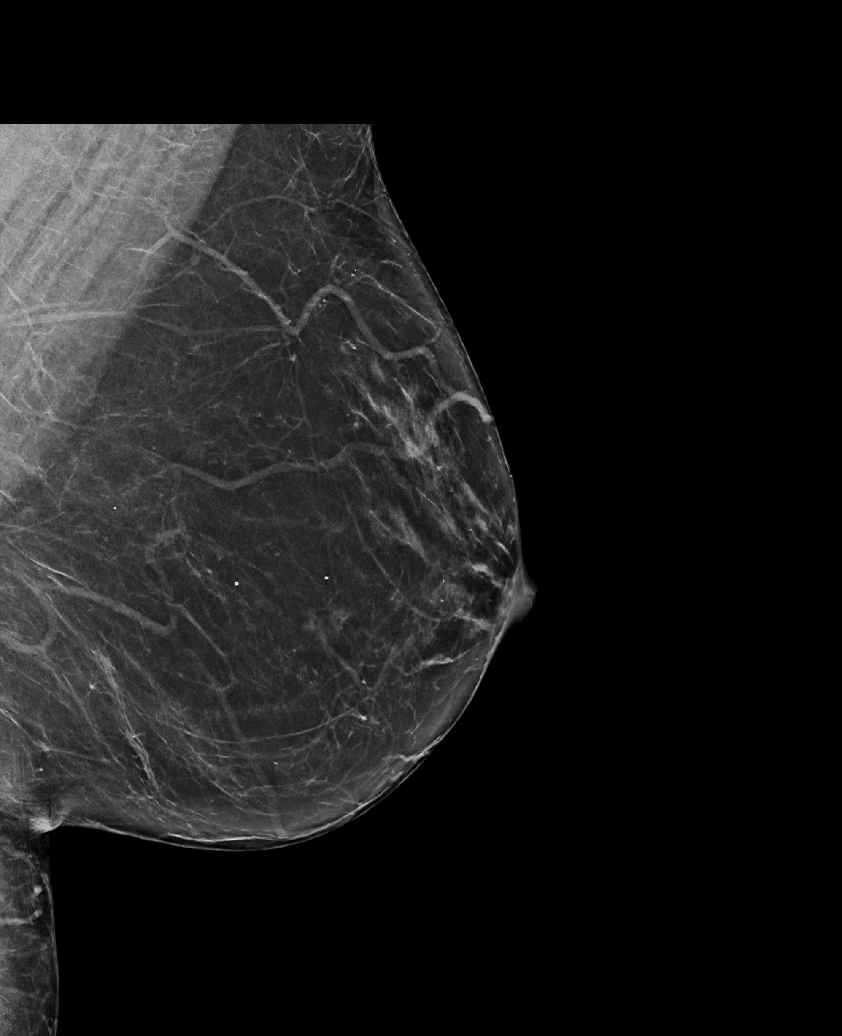

[R MLO synth-2D (1 of 2)]
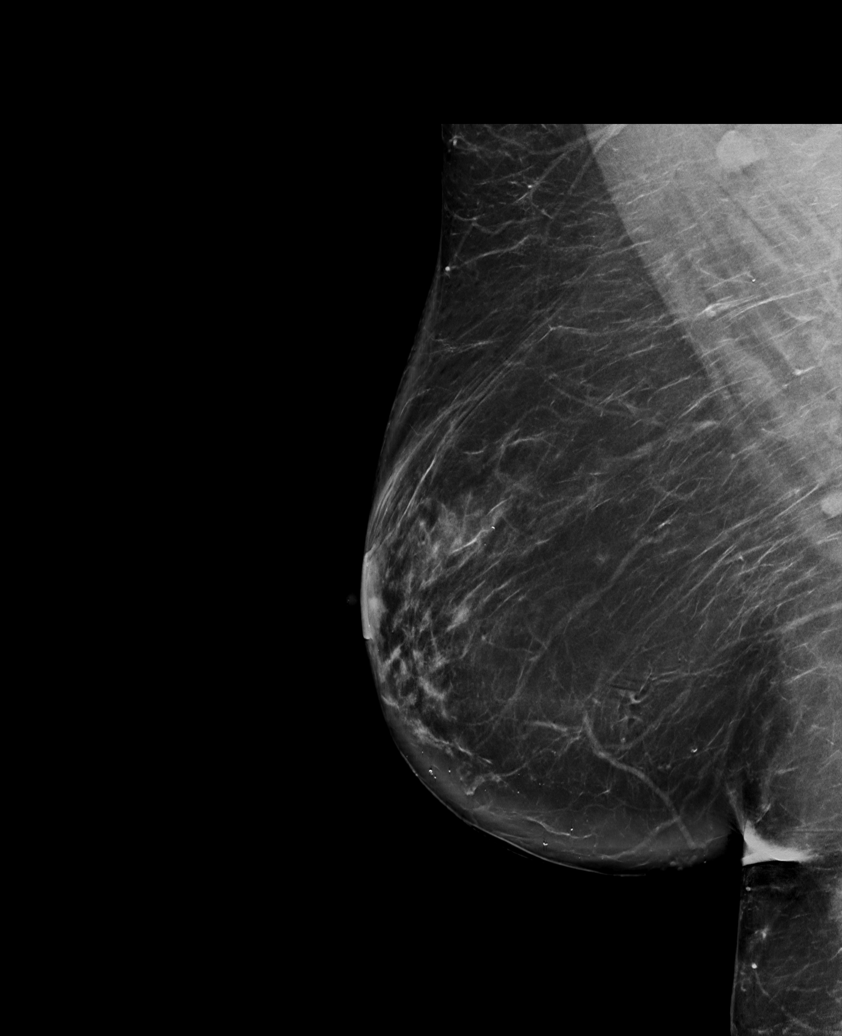

[R MLO synth-2D (2 of 2)]
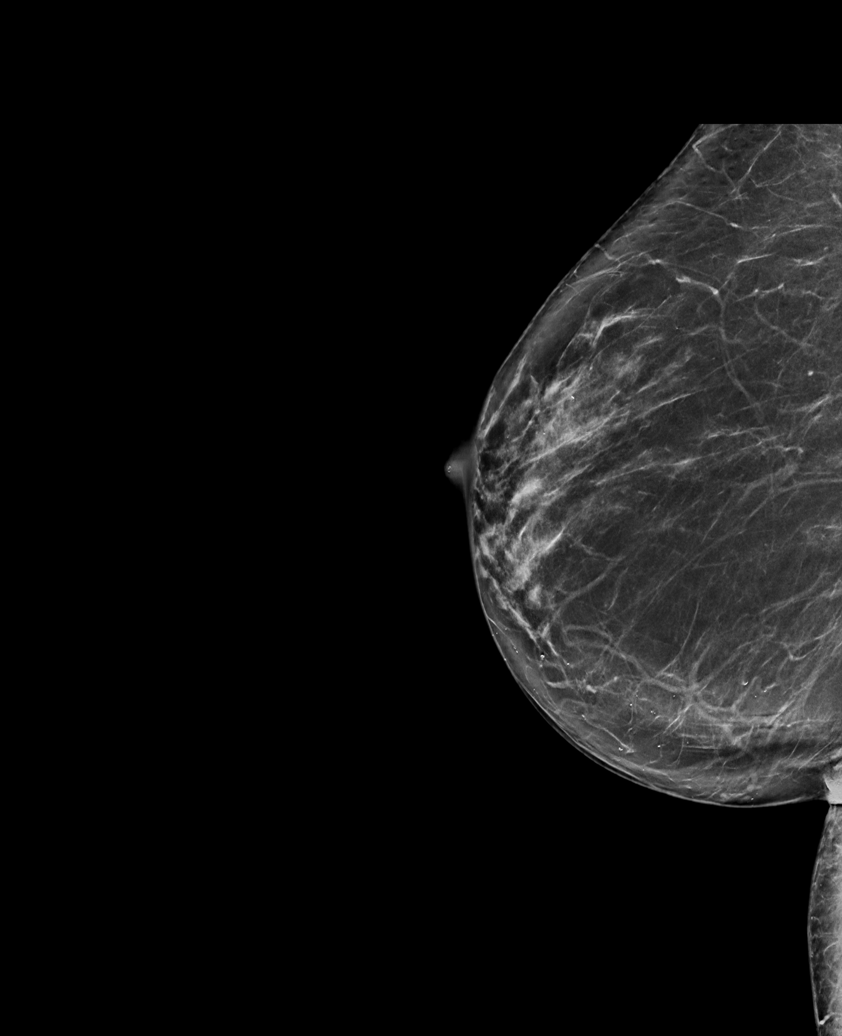

[R CC tomo · tomo slice 37/74.0]
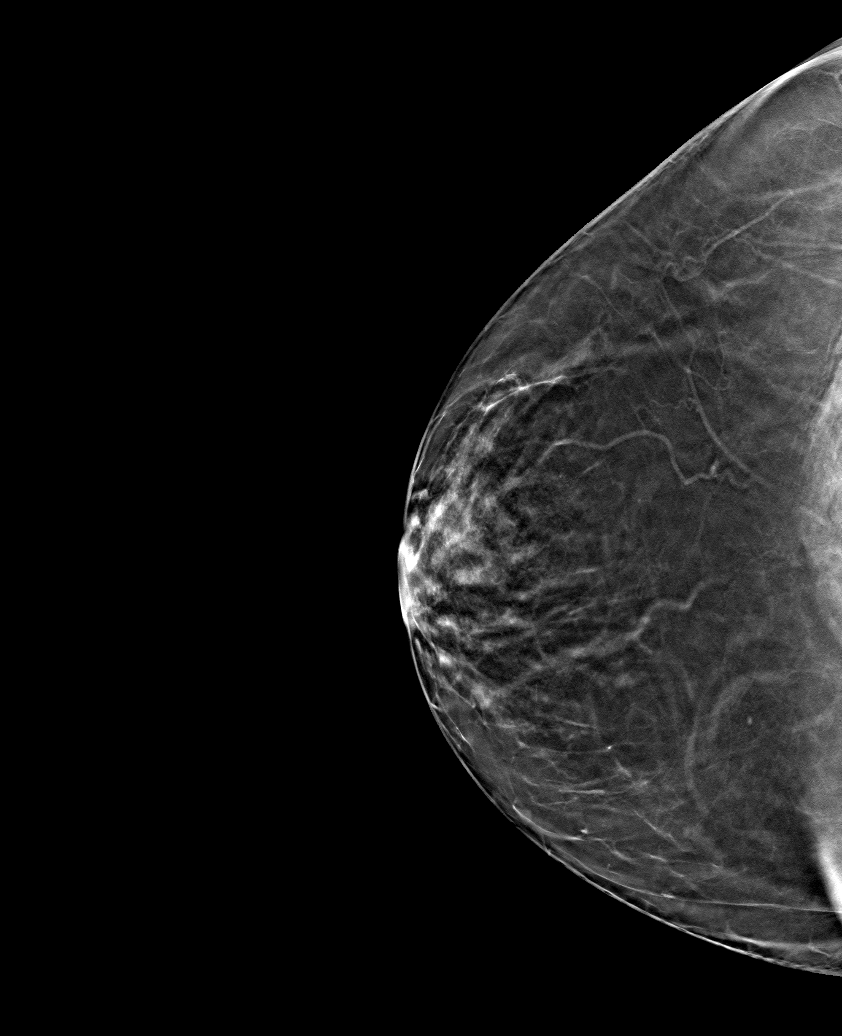

[6 of 30 positions shown; findings below may reference images not displayed]

ACR Breast Density Category b: There are scattered areas of
fibroglandular density.
FINDINGS: There are no findings suspicious for malignancy.
IMPRESSION: No mammographic evidence of malignancy. A result letter of this
screening mammogram will be mailed directly to the patient.

RECOMMENDATION:
Screening mammogram in one year. (Code:51-O-LD2)

BI-RADS CATEGORY  1: Negative.

## 2022-11-08 ENCOUNTER — Telehealth (HOSPITAL_BASED_OUTPATIENT_CLINIC_OR_DEPARTMENT_OTHER): Payer: Medicare HMO | Admitting: Psychiatry

## 2022-11-08 DIAGNOSIS — F333 Major depressive disorder, recurrent, severe with psychotic symptoms: Secondary | ICD-10-CM

## 2022-11-08 DIAGNOSIS — G4701 Insomnia due to medical condition: Secondary | ICD-10-CM | POA: Diagnosis not present

## 2022-11-08 DIAGNOSIS — F4310 Post-traumatic stress disorder, unspecified: Secondary | ICD-10-CM | POA: Diagnosis not present

## 2022-11-08 DIAGNOSIS — F411 Generalized anxiety disorder: Secondary | ICD-10-CM

## 2022-11-08 MED ORDER — HYDROXYZINE HCL 50 MG PO TABS: 50.0000 mg | ORAL_TABLET | Freq: Three times a day (TID) | ORAL | 0 refills | Status: AC

## 2022-11-08 MED ORDER — BUSPIRONE HCL 10 MG PO TABS: 10.0000 mg | ORAL_TABLET | Freq: Two times a day (BID) | ORAL | 0 refills | Status: DC

## 2022-11-08 MED ORDER — MIRTAZAPINE 45 MG PO TABS: 45.0000 mg | ORAL_TABLET | Freq: Every day | ORAL | 0 refills | Status: AC

## 2022-11-08 NOTE — Progress Notes (Signed)
Virtual Visit via Video Note  I connected with Melody Jenkins on 11/08/22 at  9:30 AM EDT by a video enabled telemedicine application and verified that I am speaking with the correct person using two identifiers.  Location: Patient: in passenger seat in car Provider: office   I discussed the limitations of evaluation and management by telemedicine and the availability of in person appointments. The patient expressed understanding and agreed to proceed.  History of Present Illness: "OMG I can't believe you are leaving me. I am going to miss you!". When Melody Jenkins got the news she had a breakdown. Melody Jenkins was crying and feeling so anxious. [name] wants to work with a woman psychiatrist. Melody Jenkins shares her anxiety is very high. It is centered on her health, finances and her relationships with others. Her stress tolerance is very low and she is easily agitated. She has ruminating and racing thoughts. Her pain is not well controlled. The Buspar helped some.   Her depression is getting worse due to stressors. The depression comes and goes. In the last 2 weeks she has been depressed for several days.  She is missing her deceased loved one. Melody Jenkins is endorsing frequent crying spells.  Her appetite is poor and she has lost 9 lbs in the last 1 week. Melody Jenkins wants to learn to say "no". She denies isolation but the people who are around her are not very supportive or good for her. Melody Jenkins gets support from her mom and 1 friend. She is endorsing anhedonia. Melody Jenkins is able to focus for a short time only.  At times she gets so tired that she wishes for death. The last time was about 3 weeks. Melody Jenkins denies SI/HI. Melody Jenkins has been gardening and is thinking about getting a part time job. She has noticed that the summer time is always hard for her. Her sleep is better with Remeron this past 1 month. Her energy is always poor due to pain.     Observations/Objective: Psychiatric Specialty Exam: ROS  Last menstrual  period 12/17/2017.There is no height or weight on file to calculate BMI.  General Appearance: Casual and Fairly Groomed  Eye Contact:  Good  Speech:  Clear and Coherent and Pressured  Volume:  Normal  Mood:  Anxious and Depressed  Affect:  Congruent  Thought Process:  Coherent and Descriptions of Associations: Circumstantial  Orientation:  Full (Time, Place, and Person)  Thought Content:  Rumination  Suicidal Thoughts:  No  Homicidal Thoughts:  No  Memory:  Immediate;   Good  Judgement:  Good  Insight:  Good  Psychomotor Activity:  Normal  Concentration:  Concentration: Fair  Recall:  Good  Fund of Knowledge:  Good  Language:  Good  Akathisia:  No  Handed:  Right  AIMS (if indicated):     Assets:  Communication Skills Desire for Improvement Financial Resources/Insurance Housing Resilience Social Support Talents/Skills Transportation Vocational/Educational  ADL's:  Intact  Cognition:  WNL  Sleep:        Assessment and Plan:     11/08/2022    9:48 AM 08/23/2022    1:39 PM 04/25/2022   10:14 AM 01/04/2022    9:43 AM 08/24/2021   10:16 AM  Depression screen PHQ 2/9  Decreased Interest 1 3 2 2  0  Down, Depressed, Hopeless 1 3 2 2 1   PHQ - 2 Score 2 6 4 4 1   Altered sleeping 0 3 2 3 2   Tired, decreased energy 3 3 2 3 2   Change  in appetite 3 0 1 3 3   Feeling bad or failure about yourself  3 3 2 3 3   Trouble concentrating 2 1 2 3 3   Moving slowly or fidgety/restless 3 1 1  0 0  Suicidal thoughts 0 0 0 0 1  PHQ-9 Score 16 17 14 19 15   Difficult doing work/chores Extremely dIfficult Extremely dIfficult Somewhat difficult Very difficult Very difficult    Flowsheet Row Video Visit from 11/08/2022 in [facility] Video Visit from 08/23/2022 in [facility] Video Visit from 01/04/2022 in [facility]  C-SSRS RISK CATEGORY Low Risk Error: Q3, 4, or 5 should not  be populated when Q2 is No No Risk          Pt is aware that these meds carry a teratogenic risk. Pt will discuss plan of action if she does or plans to become pregnant in the future.  Status of current problems: ongoing depression, anxiety   Medication management with supportive therapy. Risks and benefits, side effects and alternative treatment options discussed with patient. Pt was given an opportunity to ask questions about medication, illness, and treatment. All current psychiatric medications have been reviewed and discussed with the patient and adjusted as clinically appropriate.  Pt verbalized understanding and verbal consent obtained for treatment.  Meds: increase Buspar 10mg  po BID 1. GAD (generalized anxiety disorder) - busPIRone (BUSPAR) 10 MG tablet; Take 1 tablet (10 mg total) by mouth 2 (two) times daily.  Dispense: 180 tablet; Refill: 0 - hydrOXYzine (ATARAX) 50 MG tablet; Take 1 tablet (50 mg total) by mouth 3 (three) times daily.  Dispense: 270 tablet; Refill: 0 - mirtazapine (REMERON) 45 MG tablet; Take 1 tablet (45 mg total) by mouth at bedtime.  Dispense: 90 tablet; Refill: 0  2. Insomnia due to medical condition - busPIRone (BUSPAR) 10 MG tablet; Take 1 tablet (10 mg total) by mouth 2 (two) times daily.  Dispense: 180 tablet; Refill: 0 - hydrOXYzine (ATARAX) 50 MG tablet; Take 1 tablet (50 mg total) by mouth 3 (three) times daily.  Dispense: 270 tablet; Refill: 0 - mirtazapine (REMERON) 45 MG tablet; Take 1 tablet (45 mg total) by mouth at bedtime.  Dispense: 90 tablet; Refill: 0  3. MDD (major depressive disorder), recurrent, severe, with psychosis (HCC) - busPIRone (BUSPAR) 10 MG tablet; Take 1 tablet (10 mg total) by mouth 2 (two) times daily.  Dispense: 180 tablet; Refill: 0 - mirtazapine (REMERON) 45 MG tablet; Take 1 tablet (45 mg total) by mouth at bedtime.  Dispense: 90 tablet; Refill: 0  4. PTSD (post-traumatic stress disorder) - mirtazapine (REMERON) 45 MG  tablet; Take 1 tablet (45 mg total) by mouth at bedtime.  Dispense: 90 tablet; Refill: 0     Labs: none today    Therapy: brief supportive therapy provided. Discussed psychosocial stressors in detail.     Collaboration of Care: Other none today  Patient/Guardian was advised Release of Information must be obtained prior to any record release in order to collaborate their care with an outside provider. Patient/Guardian was advised if they have not already done so to contact the registration department to sign all necessary forms in order for [place] to release information regarding their care.   Consent: Patient/Guardian gives verbal consent for treatment and assignment of benefits for services provided during this visit. Patient/Guardian expressed understanding and agreed to proceed.       Follow Up Instructions: Follow up in 2-3 months or sooner  if needed with a new psychiatrist  Patient informed that I am leaving [facility] in 12/2022 and I relayed that they will be getting a new provider after that. Patient verbalized understanding and agreed with the plan.     I discussed the assessment and treatment plan with the patient. The patient was provided an opportunity to ask questions and all were answered. The patient agreed with the plan and demonstrated an understanding of the instructions.   The patient was advised to call back or seek an in-person evaluation if the symptoms worsen or if the condition fails to improve as anticipated.  I provided 25 minutes of non-face-to-face time during this encounter.   Oletta Darter, MD

## 2022-11-13 ENCOUNTER — Other Ambulatory Visit: Payer: Self-pay | Admitting: Family Medicine

## 2022-11-13 DIAGNOSIS — Z1231 Encounter for screening mammogram for malignant neoplasm of breast: Secondary | ICD-10-CM

## 2022-11-28 ENCOUNTER — Telehealth (HOSPITAL_COMMUNITY): Payer: Self-pay | Admitting: Psychiatry

## 2022-12-11 ENCOUNTER — Ambulatory Visit
Admission: RE | Admit: 2022-12-11 | Discharge: 2022-12-11 | Disposition: A | Discharge status: Home / Self Care | Payer: Medicare HMO | Source: Ambulatory Visit | Attending: Family Medicine | Admitting: Family Medicine

## 2022-12-11 DIAGNOSIS — Z1231 Encounter for screening mammogram for malignant neoplasm of breast: Secondary | ICD-10-CM

## 2023-01-03 ENCOUNTER — Other Ambulatory Visit: Payer: Self-pay | Admitting: Internal Medicine

## 2023-01-03 DIAGNOSIS — I10 Essential (primary) hypertension: Secondary | ICD-10-CM

## 2023-01-09 ENCOUNTER — Other Ambulatory Visit: Payer: Self-pay | Admitting: Internal Medicine

## 2023-01-09 DIAGNOSIS — I10 Essential (primary) hypertension: Secondary | ICD-10-CM

## 2023-04-07 ENCOUNTER — Other Ambulatory Visit: Payer: Self-pay | Admitting: Internal Medicine

## 2023-04-07 DIAGNOSIS — R9431 Abnormal electrocardiogram [ECG] [EKG]: Secondary | ICD-10-CM

## 2023-04-07 DIAGNOSIS — I10 Essential (primary) hypertension: Secondary | ICD-10-CM

## 2023-10-23 ENCOUNTER — Other Ambulatory Visit: Payer: Self-pay

## 2023-10-23 ENCOUNTER — Ambulatory Visit (HOSPITAL_COMMUNITY)
Admission: EM | Admit: 2023-10-23 | Discharge: 2023-10-23 | Disposition: A | Discharge status: Home / Self Care | Attending: Emergency Medicine | Admitting: Emergency Medicine

## 2023-10-23 ENCOUNTER — Encounter (HOSPITAL_COMMUNITY): Payer: Self-pay | Admitting: Emergency Medicine

## 2023-10-23 DIAGNOSIS — M79601 Pain in right arm: Secondary | ICD-10-CM

## 2023-10-23 DIAGNOSIS — M79602 Pain in left arm: Secondary | ICD-10-CM

## 2023-10-23 MED ORDER — NAPROXEN 500 MG PO TABS: 500.0000 mg | ORAL_TABLET | Freq: Two times a day (BID) | ORAL | 0 refills | Status: DC

## 2023-10-23 NOTE — Discharge Instructions (Signed)
 Take Naproxen  twice daily as needed for pain. Do not take this with other NSAIDs including ibuprofen, Motrin, Advil, Aleve , goody powder, and meloxicam . You can take Tylenol  every 6-8 hours as needed for for breakthrough pain.  Alternate between ice and heat as needed for pain.  Rest and elevate your arms as often as possible.  Follow-up with [facility] if your pain continues.  Follow-up with primary care or return here as needed.
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
## 2023-11-03 ENCOUNTER — Emergency Department (HOSPITAL_BASED_OUTPATIENT_CLINIC_OR_DEPARTMENT_OTHER)
Admission: EM | Admit: 2023-11-03 | Discharge: 2023-11-03 | Disposition: A | Discharge status: Home / Self Care | Attending: Emergency Medicine | Admitting: Emergency Medicine

## 2023-11-03 ENCOUNTER — Encounter (HOSPITAL_BASED_OUTPATIENT_CLINIC_OR_DEPARTMENT_OTHER): Payer: Self-pay | Admitting: Emergency Medicine

## 2023-11-03 ENCOUNTER — Emergency Department (HOSPITAL_BASED_OUTPATIENT_CLINIC_OR_DEPARTMENT_OTHER)

## 2023-11-03 DIAGNOSIS — W232XXA Caught, crushed, jammed or pinched between a moving and stationary object, initial encounter: Secondary | ICD-10-CM | POA: Insufficient documentation

## 2023-11-03 DIAGNOSIS — Z79899 Other long term (current) drug therapy: Secondary | ICD-10-CM | POA: Diagnosis not present

## 2023-11-03 DIAGNOSIS — I10 Essential (primary) hypertension: Secondary | ICD-10-CM | POA: Diagnosis not present

## 2023-11-03 DIAGNOSIS — E119 Type 2 diabetes mellitus without complications: Secondary | ICD-10-CM | POA: Insufficient documentation

## 2023-11-03 DIAGNOSIS — J45909 Unspecified asthma, uncomplicated: Secondary | ICD-10-CM | POA: Insufficient documentation

## 2023-11-03 DIAGNOSIS — S8782XA Crushing injury of left lower leg, initial encounter: Secondary | ICD-10-CM | POA: Diagnosis not present

## 2023-11-03 DIAGNOSIS — S8992XA Unspecified injury of left lower leg, initial encounter: Secondary | ICD-10-CM | POA: Diagnosis present
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
## 2023-11-03 NOTE — ED Provider Notes (Signed)
 [place] EMERGENCY DEPARTMENT AT [facility] Provider Note   CSN: [id number] Arrival date & time: 11/03/23  1318     History  Chief Complaint  Patient presents with   Leg Injury    Melody Jenkins is a 58 y.o. female past medical history significant for diabetes, hypertension, and asthma presents today for left leg injury.  Patient reports on Friday she got her leg crushed between a lawnmower and deck while riding it.  Patient endorses pain, swelling, and ecchymosis.  Patient denies numbness, tingling, deformity, any other complaints at this time.  HPI     Home Medications Prior to Admission medications   Medication Sig Start Date End Date Taking? Authorizing Provider  Blood Glucose Monitoring Suppl (BLOOD GLUCOSE SYSTEM PAK) KIT Please dispense based on patient and insurance preference. Use as directed to monitor FSBS 2x daily. Dx: E11.9 05/19/20   Mathis Som, MD  busPIRone  (BUSPAR ) 10 MG tablet Take 1 tablet (10 mg total) by mouth 2 (two) times daily. 11/08/22   Agarwal, [place], MD  Cholecalciferol (VITAMIN D3) 25 MCG (1000 UT) CAPS Take by mouth 2 (two) times daily.    [provider]  cloNIDine  (CATAPRES ) 0.2 MG tablet TAKE 1 TABLET TWICE DAILY 04/18/21   Arcadio Knuckles, MD  Cyanocobalamin (VITAMIN B 12 PO) Take 1 tablet by mouth daily.     [provider]  cyclobenzaprine  (FLEXERIL ) 5 MG tablet Take 1 tablet (5 mg total) by mouth 3 (three) times daily as needed for muscle spasms. 12/04/21   Arcadio Knuckles, MD  Dulaglutide  (TRULICITY ) 0.75 MG/0.5ML SOPN INJECT 0.75MG  (1 PEN) UNDER THE SKIN EVERY WEEK 09/19/22   Arcadio Knuckles, MD  gabapentin  (NEURONTIN ) 300 MG capsule Take 600 mg by mouth 3 (three) times daily.    [provider]  Glucose Blood (BLOOD GLUCOSE TEST STRIPS) STRP Please dispense based on patient and insurance preference. Use as directed to monitor FSBS 2x daily. Dx: E11.9 05/19/20   Mathis Som, MD  hydrocortisone  2.5  % ointment Apply topically 2 (two) times daily. To affected areas on face 12/27/20   Arcadio Knuckles, MD  hydrOXYzine  (ATARAX ) 50 MG tablet Take 1 tablet (50 mg total) by mouth 3 (three) times daily. 11/08/22   Fulton Job, MD  indapamide  (LOZOL ) 1.25 MG tablet TAKE 1 TABLET(1.25 MG) BY MOUTH DAILY 10/04/22   Arcadio Knuckles, MD  Lancets MISC Please dispense based on patient and insurance preference. Use as directed to monitor FSBS 2x daily. Dx: E11.9 05/19/20   Mathis Som, MD  lidocaine  (XYLOCAINE ) 2 % jelly Apply 1 Application topically 3 (three) times daily as needed. Using Q-tip, apply dime sized amount to the affected area of the face and rub in. 05/09/22   Lamptey, Donley Furth, MD  meloxicam  (MOBIC ) 7.5 MG tablet TAKE 1 TABLET(7.5 MG) BY MOUTH DAILY 08/27/22   Arcadio Knuckles, MD  metoprolol  succinate (TOPROL -XL) 100 MG 24 hr tablet TAKE 1 TABLET (100 MG TOTAL) BY MOUTH DAILY. TAKE WITH OR IMMEDIATELY FOLLOWING A MEAL. 04/07/23   Arcadio Knuckles, MD  mirtazapine  (REMERON ) 45 MG tablet Take 1 tablet (45 mg total) by mouth at bedtime. 11/08/22   Fulton Job, MD  naproxen  (NAPROSYN ) 500 MG tablet Take 1 tablet (500 mg total) by mouth 2 (two) times daily. 10/23/23   Levora Reas A, NP  oxyCODONE -acetaminophen  (PERCOCET) 5-325 MG tablet take 1 tablet by oral route  every 8 hours as needed. 09/12/21  [provider]  polyethylene glycol (MIRALAX ) 17 g packet Take 17 g by mouth 2 (two) times daily. Patient not taking: Reported on 11/08/2022 09/06/20   Ace Holder, MD  potassium chloride  (KLOR-CON ) 10 MEQ tablet TAKE 1 TABLET(10 MEQ) BY MOUTH THREE TIMES DAILY 07/25/22   Arcadio Knuckles, MD  rosuvastatin  (CRESTOR ) 10 MG tablet Take 1 tablet (10 mg total) by mouth daily. 04/23/22   Arcadio Knuckles, MD  TRELEGY ELLIPTA  200-62.5-25 MCG/ACT AEPB INHALE 1 PUFF INTO THE LUNGS DAILY 08/25/22   Arcadio Knuckles, MD      Allergies    Iodinated contrast media, Trazodone  and nefazodone, and  Tape    Review of Systems   Review of Systems  Cardiovascular:  Positive for leg swelling.  Skin:  Positive for color change.    Physical Exam Updated Vital Signs BP 119/77 (BP Location: Right Arm)   Pulse 76   Temp 98 F (36.7 C)   Resp 16   LMP 12/17/2017   SpO2 99%  Physical Exam Vitals and nursing note reviewed.  Constitutional:      General: She is not in acute distress.    Appearance: Normal appearance. She is well-developed. She is not ill-appearing, toxic-appearing or diaphoretic.  HENT:     Head: Normocephalic and atraumatic.     Right Ear: External ear normal.     Left Ear: External ear normal.     Nose: Nose normal.  Eyes:     Conjunctiva/sclera: Conjunctivae normal.  Cardiovascular:     Rate and Rhythm: Normal rate and regular rhythm.  Pulmonary:     Effort: Pulmonary effort is normal. No respiratory distress.  Abdominal:     Palpations: Abdomen is soft.  Musculoskeletal:        General: Swelling, tenderness and signs of injury present.     Cervical back: Neck supple.     Comments: Patient with swelling to left When compared to right and ecchymosis noted.  Small, superficial hemostatic abrasions noted to left shin.  Patient is neurovascularly intact.  Patient has tenderness to palpation of the posterior knee, and calf muscles.  +2 dorsalis pedis pulses.  All compartments soft.  Skin:    General: Skin is warm and dry.     Capillary Refill: Capillary refill takes less than 2 seconds.     Findings: Bruising present.  Neurological:     Mental Status: She is alert.  Psychiatric:        Mood and Affect: Mood normal.     ED Results / Procedures / Treatments   Labs (all labs ordered are listed, but only abnormal results are displayed) Labs Reviewed - No data to display  EKG None  Radiology US  Venous Img Lower Unilateral Left Result Date: 11/03/2023 CLINICAL DATA:  Swelling.  Crush injury. EXAM: Left LOWER EXTREMITY VENOUS DOPPLER ULTRASOUND TECHNIQUE:  Gray-scale sonography with graded compression, as well as color Doppler and duplex ultrasound were performed to evaluate the lower extremity deep venous systems from the level of the common femoral vein and including the common femoral, femoral, profunda femoral, popliteal and calf veins including the posterior tibial, peroneal and gastrocnemius veins when visible. The superficial great saphenous vein was also interrogated. Spectral Doppler was utilized to evaluate flow at rest and with distal augmentation maneuvers in the common femoral, femoral and popliteal veins. COMPARISON:  None Available. FINDINGS: Contralateral Common Femoral Vein: Respiratory phasicity is normal and symmetric with the symptomatic side. No evidence of thrombus. Normal compressibility.  Common Femoral Vein: No evidence of thrombus. Normal compressibility, respiratory phasicity and response to augmentation. Saphenofemoral Junction: No evidence of thrombus. Normal compressibility and flow on color Doppler imaging. Profunda Femoral Vein: No evidence of thrombus. Normal compressibility and flow on color Doppler imaging. Femoral Vein: No evidence of thrombus. Normal compressibility, respiratory phasicity and response to augmentation. Popliteal Vein: No evidence of thrombus. Normal compressibility, respiratory phasicity and response to augmentation. Calf Veins: No evidence of thrombus. Normal compressibility and flow on color Doppler imaging. Superficial Great Saphenous Vein: No evidence of thrombus. Normal compressibility. Venous Reflux:  None. Other Findings: If there is further concern of the sequela of trauma additional cross-sectional imaging study could be considered as clinically appropriate. IMPRESSION: No evidence of left lower extremity DVT. Electronically Signed   By: Adrianna Horde M.D.   On: 11/03/2023 15:23   DG Tibia/Fibula Left Port Result Date: 11/03/2023 CLINICAL DATA:  Injury 1 week ago to left lower leg. 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 No evidence of acute fracture or dislocation. No joint effusion. Small inferior calcaneal spur. Ankle mortise is normal. IMPRESSION: 1. No acute findings. 2. 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 Electronically Signed   By: Roda Cirri M.D.   On: 11/03/2023 14:47    Procedures Procedures    Medications Ordered in ED Medications - No data to display  ED Course/ Medical Decision Making/ A&P                                 Medical Decision Making Amount and/or Complexity of Data Reviewed Radiology: ordered.   This patient presents to the ED for concern of left lower extremity injury differential diagnosis includes tibia fracture, fibular fracture, musculoskeletal pain, hematoma, DVT    Additional history obtained  External records from outside source obtained and reviewed including internal medicine office visit notes   Imaging Studies ordered:  I ordered imaging studies including tib-fib x-ray, DVT left lower extremity ultrasound I independently visualized and interpreted imaging which showed negative x-ray, no evidence of left lower extremity DVT I agree with the radiologist interpretation  Problem List / ED Course:  Ace wrap applied to affected extremity, patient neurovascularly intact after Ace application. Considered for admission further workup however patient's vital signs, physical exam, and imaging of been reassuring.  Patient's symptoms likely due to musculoskeletal pain.  Patient advised to rest, ice, compress, and elevate the affected limbs.  Patient also advised to take Tylenol /Motrin as needed for pain.  Patient given return precautions.  I feel patient is safe for discharge at this time.        Final Clinical Impression(s) / ED Diagnoses Final diagnoses:  Crushing injury of left lower extremity, initial encounter     Rx / DC Orders ED Discharge Orders     None         Merryl Abraham 11/03/23 1530    Lind Repine, MD 11/04/23 1222

## 2023-11-12 ENCOUNTER — Ambulatory Visit (HOSPITAL_BASED_OUTPATIENT_CLINIC_OR_DEPARTMENT_OTHER)

## 2023-11-12 ENCOUNTER — Emergency Department (HOSPITAL_BASED_OUTPATIENT_CLINIC_OR_DEPARTMENT_OTHER)
Admission: EM | Admit: 2023-11-12 | Discharge: 2023-11-12 | Disposition: A | Discharge status: Home / Self Care | Source: Home / Self Care | Attending: Emergency Medicine | Admitting: Emergency Medicine

## 2023-11-12 ENCOUNTER — Encounter (HOSPITAL_BASED_OUTPATIENT_CLINIC_OR_DEPARTMENT_OTHER): Payer: Self-pay | Admitting: Emergency Medicine

## 2023-11-12 ENCOUNTER — Other Ambulatory Visit: Payer: Self-pay

## 2023-11-12 DIAGNOSIS — R0789 Other chest pain: Secondary | ICD-10-CM | POA: Diagnosis not present

## 2023-11-12 DIAGNOSIS — M7989 Other specified soft tissue disorders: Secondary | ICD-10-CM | POA: Insufficient documentation

## 2023-11-12 DIAGNOSIS — R011 Cardiac murmur, unspecified: Secondary | ICD-10-CM | POA: Diagnosis not present

## 2023-11-12 DIAGNOSIS — F4310 Post-traumatic stress disorder, unspecified: Secondary | ICD-10-CM | POA: Diagnosis not present

## 2023-11-12 DIAGNOSIS — F17210 Nicotine dependence, cigarettes, uncomplicated: Secondary | ICD-10-CM | POA: Diagnosis not present

## 2023-11-12 DIAGNOSIS — Z6831 Body mass index (BMI) 31.0-31.9, adult: Secondary | ICD-10-CM | POA: Diagnosis not present

## 2023-11-12 DIAGNOSIS — Z79899 Other long term (current) drug therapy: Secondary | ICD-10-CM | POA: Diagnosis not present

## 2023-11-12 DIAGNOSIS — I110 Hypertensive heart disease with heart failure: Secondary | ICD-10-CM | POA: Diagnosis not present

## 2023-11-12 DIAGNOSIS — I10 Essential (primary) hypertension: Secondary | ICD-10-CM | POA: Insufficient documentation

## 2023-11-12 DIAGNOSIS — Z91041 Radiographic dye allergy status: Secondary | ICD-10-CM | POA: Diagnosis not present

## 2023-11-12 DIAGNOSIS — N179 Acute kidney failure, unspecified: Secondary | ICD-10-CM | POA: Diagnosis not present

## 2023-11-12 DIAGNOSIS — Z833 Family history of diabetes mellitus: Secondary | ICD-10-CM | POA: Diagnosis not present

## 2023-11-12 DIAGNOSIS — J4530 Mild persistent asthma, uncomplicated: Secondary | ICD-10-CM | POA: Diagnosis not present

## 2023-11-12 DIAGNOSIS — E11628 Type 2 diabetes mellitus with other skin complications: Secondary | ICD-10-CM | POA: Diagnosis not present

## 2023-11-12 DIAGNOSIS — E1169 Type 2 diabetes mellitus with other specified complication: Secondary | ICD-10-CM | POA: Diagnosis not present

## 2023-11-12 DIAGNOSIS — E119 Type 2 diabetes mellitus without complications: Secondary | ICD-10-CM | POA: Insufficient documentation

## 2023-11-12 DIAGNOSIS — Y92007 Garden or yard of unspecified non-institutional (private) residence as the place of occurrence of the external cause: Secondary | ICD-10-CM | POA: Diagnosis not present

## 2023-11-12 DIAGNOSIS — M1990 Unspecified osteoarthritis, unspecified site: Secondary | ICD-10-CM | POA: Diagnosis not present

## 2023-11-12 DIAGNOSIS — Z7985 Long-term (current) use of injectable non-insulin antidiabetic drugs: Secondary | ICD-10-CM | POA: Diagnosis not present

## 2023-11-12 DIAGNOSIS — I5032 Chronic diastolic (congestive) heart failure: Secondary | ICD-10-CM | POA: Diagnosis not present

## 2023-11-12 DIAGNOSIS — E66811 Obesity, class 1: Secondary | ICD-10-CM | POA: Diagnosis not present

## 2023-11-12 DIAGNOSIS — M549 Dorsalgia, unspecified: Secondary | ICD-10-CM | POA: Diagnosis not present

## 2023-11-12 DIAGNOSIS — G8929 Other chronic pain: Secondary | ICD-10-CM | POA: Diagnosis not present

## 2023-11-12 DIAGNOSIS — E785 Hyperlipidemia, unspecified: Secondary | ICD-10-CM | POA: Diagnosis not present

## 2023-11-12 DIAGNOSIS — M79605 Pain in left leg: Secondary | ICD-10-CM | POA: Diagnosis present

## 2023-11-12 DIAGNOSIS — E1140 Type 2 diabetes mellitus with diabetic neuropathy, unspecified: Secondary | ICD-10-CM | POA: Diagnosis not present

## 2023-11-12 DIAGNOSIS — L03116 Cellulitis of left lower limb: Secondary | ICD-10-CM | POA: Diagnosis not present

## 2023-11-12 DIAGNOSIS — F418 Other specified anxiety disorders: Secondary | ICD-10-CM | POA: Diagnosis not present

## 2023-11-12 DIAGNOSIS — Z8249 Family history of ischemic heart disease and other diseases of the circulatory system: Secondary | ICD-10-CM | POA: Diagnosis not present

## 2023-11-12 DIAGNOSIS — W230XXA Caught, crushed, jammed, or pinched between moving objects, initial encounter: Secondary | ICD-10-CM | POA: Diagnosis not present

## 2023-11-12 NOTE — ED Triage Notes (Signed)
 Left leg pain and swelling. Seen here on 5/25 for getting leg "crushed" between lawnmower and deck. Open wound that is healing on top of shin. States area is "numb".

## 2023-11-12 NOTE — ED Provider Notes (Signed)
 [place] EMERGENCY DEPARTMENT AT [facility] Provider Note   CSN: [id number] Arrival date & time: 11/12/23  4098     History  Chief Complaint  Patient presents with   Leg Injury    Melody Jenkins is a 58 y.o. female.  Patient with past medical history of diabetes, hypertension, hyperlipidemia presenting to emergency room with complaint of left lower leg pain and swelling.  She reports that about 10 days ago she had injury to this leg, reports she was riding a lawnmower and her leg was crushed between the lawnmower and the side of her deck.  She was seen here for this and had normal x-ray.  She has been elevating her leg in the evening and wearing Ace bandage during the day without any significant improvement in pain or swelling.  Feels her symptoms are just getting worse.  She is not on blood thinner.  No history of DVT.   HPI     Home Medications Prior to Admission medications   Medication Sig Start Date End Date Taking? Authorizing Provider  Blood Glucose Monitoring Suppl (BLOOD GLUCOSE SYSTEM PAK) KIT Please dispense based on patient and insurance preference. Use as directed to monitor FSBS 2x daily. Dx: E11.9 05/19/20   Mathis Som, MD  busPIRone  (BUSPAR ) 10 MG tablet Take 1 tablet (10 mg total) by mouth 2 (two) times daily. 11/08/22   Agarwal, [place], MD  Cholecalciferol (VITAMIN D3) 25 MCG (1000 UT) CAPS Take by mouth 2 (two) times daily.    [provider]  cloNIDine  (CATAPRES ) 0.2 MG tablet TAKE 1 TABLET TWICE DAILY 04/18/21   Arcadio Knuckles, MD  Cyanocobalamin (VITAMIN B 12 PO) Take 1 tablet by mouth daily.     [provider]  cyclobenzaprine  (FLEXERIL ) 5 MG tablet Take 1 tablet (5 mg total) by mouth 3 (three) times daily as needed for muscle spasms. 12/04/21   Arcadio Knuckles, MD  Dulaglutide  (TRULICITY ) 0.75 MG/0.5ML SOPN INJECT 0.75MG  (1 PEN) UNDER THE SKIN EVERY WEEK 09/19/22   Arcadio Knuckles, MD  gabapentin  (NEURONTIN ) 300 MG capsule Take  600 mg by mouth 3 (three) times daily.    [provider]  Glucose Blood (BLOOD GLUCOSE TEST STRIPS) STRP Please dispense based on patient and insurance preference. Use as directed to monitor FSBS 2x daily. Dx: E11.9 05/19/20   Mathis Som, MD  hydrocortisone  2.5 % ointment Apply topically 2 (two) times daily. To affected areas on face 12/27/20   Arcadio Knuckles, MD  hydrOXYzine  (ATARAX ) 50 MG tablet Take 1 tablet (50 mg total) by mouth 3 (three) times daily. 11/08/22   Fulton Job, MD  indapamide  (LOZOL ) 1.25 MG tablet TAKE 1 TABLET(1.25 MG) BY MOUTH DAILY 10/04/22   Arcadio Knuckles, MD  Lancets MISC Please dispense based on patient and insurance preference. Use as directed to monitor FSBS 2x daily. Dx: E11.9 05/19/20   Mathis Som, MD  lidocaine  (XYLOCAINE ) 2 % jelly Apply 1 Application topically 3 (three) times daily as needed. Using Q-tip, apply dime sized amount to the affected area of the face and rub in. 05/09/22   Lamptey, Donley Furth, MD  meloxicam  (MOBIC ) 7.5 MG tablet TAKE 1 TABLET(7.5 MG) BY MOUTH DAILY 08/27/22   Arcadio Knuckles, MD  metoprolol  succinate (TOPROL -XL) 100 MG 24 hr tablet TAKE 1 TABLET (100 MG TOTAL) BY MOUTH DAILY. TAKE WITH OR IMMEDIATELY FOLLOWING A MEAL. 04/07/23   Arcadio Knuckles, MD  mirtazapine  (REMERON ) 45 MG  tablet Take 1 tablet (45 mg total) by mouth at bedtime. 11/08/22   Fulton Job, MD  naproxen  (NAPROSYN ) 500 MG tablet Take 1 tablet (500 mg total) by mouth 2 (two) times daily. 10/23/23   Levora Reas A, NP  oxyCODONE -acetaminophen  (PERCOCET) 5-325 MG tablet take 1 tablet by oral route  every 8 hours as needed. 09/12/21   [provider]  polyethylene glycol (MIRALAX ) 17 g packet Take 17 g by mouth 2 (two) times daily. Patient not taking: Reported on 11/08/2022 09/06/20   Ace Holder, MD  potassium chloride  (KLOR-CON ) 10 MEQ tablet TAKE 1 TABLET(10 MEQ) BY MOUTH THREE TIMES DAILY 07/25/22   Arcadio Knuckles, MD  rosuvastatin   (CRESTOR ) 10 MG tablet Take 1 tablet (10 mg total) by mouth daily. 04/23/22   Arcadio Knuckles, MD  TRELEGY ELLIPTA  200-62.5-25 MCG/ACT AEPB INHALE 1 PUFF INTO THE LUNGS DAILY 08/25/22   Arcadio Knuckles, MD      Allergies    Iodinated contrast media, Trazodone  and nefazodone, and Tape    Review of Systems   Review of Systems  Cardiovascular:  Positive for leg swelling.    Physical Exam Updated Vital Signs BP (!) 148/74 (BP Location: Left Arm)   Pulse 77   Temp 97.9 F (36.6 C) (Oral)   Resp 18   Ht 5\' 7"  (1.702 m)   Wt 90.7 kg   LMP 12/17/2017   SpO2 99%   BMI 31.32 kg/m  Physical Exam Vitals and nursing note reviewed.  Constitutional:      General: She is not in acute distress.    Appearance: She is not toxic-appearing.  HENT:     Head: Normocephalic and atraumatic.  Eyes:     General: No scleral icterus.    Conjunctiva/sclera: Conjunctivae normal.  Cardiovascular:     Rate and Rhythm: Normal rate and regular rhythm.     Pulses: Normal pulses.     Heart sounds: Normal heart sounds.  Pulmonary:     Effort: Pulmonary effort is normal. No respiratory distress.     Breath sounds: Normal breath sounds.  Abdominal:     General: Abdomen is flat. Bowel sounds are normal.     Palpations: Abdomen is soft.     Tenderness: There is no abdominal tenderness.  Musculoskeletal:     Right lower leg: No edema.     Left lower leg: Edema present.     Comments: Strong dorsal pedal pulse, compartments soft.  Abrasion, heeling to left anterior shin, no surrounding erythema.  Sensation of extremity intact, but reports decreased sensation over wound/scab.   Skin:    General: Skin is warm and dry.     Findings: No lesion.  Neurological:     General: No focal deficit present.     Mental Status: She is alert and oriented to person, place, and time. Mental status is at baseline.     ED Results / Procedures / Treatments   Labs (all labs ordered are listed, but only abnormal results  are displayed) Labs Reviewed - No data to display  EKG None  Radiology No results found.  Procedures Procedures    Medications Ordered in ED Medications - No data to display  ED Course/ Medical Decision Making/ A&P                                 Medical Decision Making  This patient presents to the  ED for concern of left lower extremity swelling, this involves an extensive number of treatment options, and is a complaint that carries with it a high risk of complications and morbidity.  The differential diagnosis includes compartment syndrome, cellulitis, DVT  Additional history obtained:  Additional history obtained from reviewed recent ED visit 11/03/2023 in which patient was seen after her accident.  At that time she had reassuring x-ray as well as negative DVT study.   Imaging Studies ordered:  DVT study ordered, not available, patient will have this done in OP setting later today.    Problem List / ED Course / Critical interventions / Medication management  Patient presenting with right lower extremity pain and swelling.  She is neurovascularly intact.  She has soft compartments.  She has good range of motion of her knee and ankle.  She has tenderness to palpation over posterior calf and pain is worse when dorsiflexing.  She did have recent injury which may be contributing to swelling.  Would like to rule out blood clot at this time.  She is not on blood thinner.  Unfortunately we do not have this available so she will have to call to schedule appointment.  She agrees to do this. Discussed wound care for anterior shin wound.  Discussed wearing compression stockings and elevating leg in the evening.  Discussed symptom management and return precautions.  Patient will ultimately come later today for ultrasound to rule out DVT of her left lower extremity. I have reviewed the patients home medicines and have made adjustments as needed   Plan  F/u w/ PCP in 2-3d to ensure  resolution of sx.  Patient was given return precautions. Patient stable for discharge at this time.  Patient educated on sx/dx and verbalized understanding of plan. Return to ER w/ new or worsening sx.          Final Clinical Impression(s) / ED Diagnoses Final diagnoses:  Left leg swelling    Rx / DC Orders ED Discharge Orders          Ordered    Lower Ext Left Venous US        Comments: Specimen [id number]: IMPORTANT PATIENT INSTRUCTIONS:  You have been scheduled for an Outpatient Ultrasound.  Your appointment has been scheduled for:  _______ am/pm on _______________ (date).If your appointment is scheduled for a Saturday, Sunday or holiday, please go to the [employer] at [facility] Emergency Department Registration Desk at least 15 minutes prior to your appointment time and tell them you are there for an ultrasound.  If your appointment is scheduled for a weekday (Monday - Friday), please go directly to the [facility] at [facility] Radiology Department reception area at least 15 minutes prior to your appointment time and tell them you are there for an ultrasound.Please call [phone] with questions.    11/12/23 1032              Chaylee Ehrsam, Kandace Organ, PA-C 11/12/23 1104    Deatra Face, MD 11/18/23 2049

## 2023-11-12 NOTE — ED Notes (Signed)
 Discharge instructions, US  outpatient appt,a and follow up care reviewed and explained, pt verbalized understanding and had no further questions on d/c.

## 2023-11-12 NOTE — Discharge Instructions (Addendum)
 You were seen in the emergency room today for left leg swelling.  Please call to schedule ultrasound appointment later today.  Ultrasound is negative wear compression stockings during the day and elevate feet in the evening as discussed.  Follow-up with primary care. Continue with wound care to left shin.

## 2023-11-13 ENCOUNTER — Other Ambulatory Visit (HOSPITAL_BASED_OUTPATIENT_CLINIC_OR_DEPARTMENT_OTHER): Payer: Self-pay | Admitting: Emergency Medicine

## 2023-11-13 ENCOUNTER — Other Ambulatory Visit: Payer: Self-pay

## 2023-11-13 ENCOUNTER — Inpatient Hospital Stay (HOSPITAL_BASED_OUTPATIENT_CLINIC_OR_DEPARTMENT_OTHER)
Admission: EM | Admit: 2023-11-13 | Discharge: 2023-11-15 | DRG: 638 | Disposition: A | Discharge status: Home / Self Care | Attending: Internal Medicine | Admitting: Internal Medicine

## 2023-11-13 ENCOUNTER — Emergency Department (HOSPITAL_BASED_OUTPATIENT_CLINIC_OR_DEPARTMENT_OTHER)

## 2023-11-13 ENCOUNTER — Encounter (HOSPITAL_BASED_OUTPATIENT_CLINIC_OR_DEPARTMENT_OTHER): Payer: Self-pay

## 2023-11-13 ENCOUNTER — Emergency Department (HOSPITAL_BASED_OUTPATIENT_CLINIC_OR_DEPARTMENT_OTHER)
Admit: 2023-11-13 | Discharge: 2023-11-13 | Disposition: A | Discharge status: Home / Self Care | Attending: Obstetrics and Gynecology | Admitting: Obstetrics and Gynecology

## 2023-11-13 DIAGNOSIS — Z720 Tobacco use: Secondary | ICD-10-CM | POA: Diagnosis present

## 2023-11-13 DIAGNOSIS — S8012XA Contusion of left lower leg, initial encounter: Secondary | ICD-10-CM | POA: Diagnosis not present

## 2023-11-13 DIAGNOSIS — Z6831 Body mass index (BMI) 31.0-31.9, adult: Secondary | ICD-10-CM | POA: Diagnosis not present

## 2023-11-13 DIAGNOSIS — Z7985 Long-term (current) use of injectable non-insulin antidiabetic drugs: Secondary | ICD-10-CM

## 2023-11-13 DIAGNOSIS — M7989 Other specified soft tissue disorders: Secondary | ICD-10-CM

## 2023-11-13 DIAGNOSIS — F4310 Post-traumatic stress disorder, unspecified: Secondary | ICD-10-CM | POA: Diagnosis present

## 2023-11-13 DIAGNOSIS — L0291 Cutaneous abscess, unspecified: Secondary | ICD-10-CM

## 2023-11-13 DIAGNOSIS — F17210 Nicotine dependence, cigarettes, uncomplicated: Secondary | ICD-10-CM | POA: Diagnosis present

## 2023-11-13 DIAGNOSIS — L03116 Cellulitis of left lower limb: Principal | ICD-10-CM

## 2023-11-13 DIAGNOSIS — Z79899 Other long term (current) drug therapy: Secondary | ICD-10-CM | POA: Diagnosis not present

## 2023-11-13 DIAGNOSIS — E1169 Type 2 diabetes mellitus with other specified complication: Secondary | ICD-10-CM | POA: Diagnosis present

## 2023-11-13 DIAGNOSIS — Y92007 Garden or yard of unspecified non-institutional (private) residence as the place of occurrence of the external cause: Secondary | ICD-10-CM

## 2023-11-13 DIAGNOSIS — I110 Hypertensive heart disease with heart failure: Secondary | ICD-10-CM | POA: Diagnosis not present

## 2023-11-13 DIAGNOSIS — Z791 Long term (current) use of non-steroidal anti-inflammatories (NSAID): Secondary | ICD-10-CM

## 2023-11-13 DIAGNOSIS — M549 Dorsalgia, unspecified: Secondary | ICD-10-CM | POA: Diagnosis not present

## 2023-11-13 DIAGNOSIS — Z833 Family history of diabetes mellitus: Secondary | ICD-10-CM | POA: Diagnosis not present

## 2023-11-13 DIAGNOSIS — M79605 Pain in left leg: Secondary | ICD-10-CM | POA: Diagnosis present

## 2023-11-13 DIAGNOSIS — Z888 Allergy status to other drugs, medicaments and biological substances status: Secondary | ICD-10-CM

## 2023-11-13 DIAGNOSIS — R0789 Other chest pain: Secondary | ICD-10-CM | POA: Diagnosis not present

## 2023-11-13 DIAGNOSIS — W230XXA Caught, crushed, jammed, or pinched between moving objects, initial encounter: Secondary | ICD-10-CM | POA: Diagnosis present

## 2023-11-13 DIAGNOSIS — R011 Cardiac murmur, unspecified: Secondary | ICD-10-CM | POA: Diagnosis present

## 2023-11-13 DIAGNOSIS — Z9109 Other allergy status, other than to drugs and biological substances: Secondary | ICD-10-CM

## 2023-11-13 DIAGNOSIS — E11628 Type 2 diabetes mellitus with other skin complications: Principal | ICD-10-CM

## 2023-11-13 DIAGNOSIS — Z7951 Long term (current) use of inhaled steroids: Secondary | ICD-10-CM

## 2023-11-13 DIAGNOSIS — L089 Local infection of the skin and subcutaneous tissue, unspecified: Secondary | ICD-10-CM

## 2023-11-13 DIAGNOSIS — Z8249 Family history of ischemic heart disease and other diseases of the circulatory system: Secondary | ICD-10-CM | POA: Diagnosis not present

## 2023-11-13 DIAGNOSIS — M1990 Unspecified osteoarthritis, unspecified site: Secondary | ICD-10-CM | POA: Diagnosis present

## 2023-11-13 DIAGNOSIS — G8929 Other chronic pain: Secondary | ICD-10-CM | POA: Diagnosis present

## 2023-11-13 DIAGNOSIS — E66811 Obesity, class 1: Secondary | ICD-10-CM | POA: Diagnosis present

## 2023-11-13 DIAGNOSIS — J4530 Mild persistent asthma, uncomplicated: Secondary | ICD-10-CM | POA: Diagnosis not present

## 2023-11-13 DIAGNOSIS — N179 Acute kidney failure, unspecified: Secondary | ICD-10-CM | POA: Diagnosis not present

## 2023-11-13 DIAGNOSIS — F418 Other specified anxiety disorders: Secondary | ICD-10-CM | POA: Diagnosis not present

## 2023-11-13 DIAGNOSIS — I10 Essential (primary) hypertension: Secondary | ICD-10-CM | POA: Diagnosis present

## 2023-11-13 DIAGNOSIS — I5032 Chronic diastolic (congestive) heart failure: Secondary | ICD-10-CM | POA: Diagnosis not present

## 2023-11-13 DIAGNOSIS — Z91041 Radiographic dye allergy status: Secondary | ICD-10-CM | POA: Diagnosis not present

## 2023-11-13 DIAGNOSIS — E1140 Type 2 diabetes mellitus with diabetic neuropathy, unspecified: Secondary | ICD-10-CM | POA: Diagnosis not present

## 2023-11-13 DIAGNOSIS — F4323 Adjustment disorder with mixed anxiety and depressed mood: Secondary | ICD-10-CM | POA: Diagnosis not present

## 2023-11-13 DIAGNOSIS — L0390 Cellulitis, unspecified: Secondary | ICD-10-CM | POA: Diagnosis present

## 2023-11-13 DIAGNOSIS — E785 Hyperlipidemia, unspecified: Secondary | ICD-10-CM | POA: Diagnosis present

## 2023-11-13 DIAGNOSIS — E669 Obesity, unspecified: Secondary | ICD-10-CM | POA: Diagnosis present

## 2023-11-13 LAB — SEDIMENTATION RATE: Sed Rate: 1 mm/h (ref 0–22)

## 2023-11-13 LAB — CBC WITH DIFFERENTIAL/PLATELET
Abs Immature Granulocytes: 0.02 K/uL (ref 0.00–0.07)
Basophils Absolute: 0 K/uL (ref 0.0–0.1)
Basophils Relative: 1 %
Eosinophils Absolute: 0.2 K/uL (ref 0.0–0.5)
Eosinophils Relative: 2 %
HCT: 45.2 % (ref 36.0–46.0)
Hemoglobin: 14.4 g/dL (ref 12.0–15.0)
Immature Granulocytes: 0 %
Lymphocytes Relative: 42 %
Lymphs Abs: 3.3 K/uL (ref 0.7–4.0)
MCH: 27.3 pg (ref 26.0–34.0)
MCHC: 31.9 g/dL (ref 30.0–36.0)
MCV: 85.8 fL (ref 80.0–100.0)
Monocytes Absolute: 0.6 K/uL (ref 0.1–1.0)
Monocytes Relative: 8 %
Neutro Abs: 3.7 K/uL (ref 1.7–7.7)
Neutrophils Relative %: 47 %
Platelets: 245 K/uL (ref 150–400)
RBC: 5.27 MIL/uL — ABNORMAL HIGH (ref 3.87–5.11)
RDW: 15 % (ref 11.5–15.5)
WBC: 7.8 K/uL (ref 4.0–10.5)
nRBC: 0 % (ref 0.0–0.2)

## 2023-11-13 LAB — COMPREHENSIVE METABOLIC PANEL WITH GFR
ALT: 20 U/L (ref 0–44)
AST: 21 U/L (ref 15–41)
Albumin: 3.6 g/dL (ref 3.5–5.0)
Alkaline Phosphatase: 70 U/L (ref 38–126)
Anion gap: 9 (ref 5–15)
BUN: 19 mg/dL (ref 6–20)
CO2: 29 mmol/L (ref 22–32)
Calcium: 9.8 mg/dL (ref 8.9–10.3)
Chloride: 106 mmol/L (ref 98–111)
Creatinine, Ser: 1.06 mg/dL — ABNORMAL HIGH (ref 0.44–1.00)
GFR, Estimated: >60 mL/min (ref >60)
Glucose, Bld: 83 mg/dL (ref 70–99)
Potassium: 4 mmol/L (ref 3.5–5.1)
Sodium: 144 mmol/L (ref 135–145)
Total Bilirubin: 0.3 mg/dL (ref 0.0–1.2)
Total Protein: 6.2 g/dL — ABNORMAL LOW (ref 6.5–8.1)

## 2023-11-13 LAB — CK: Total CK: 121 U/L (ref 38–234)

## 2023-11-13 LAB — HIV ANTIBODY (ROUTINE TESTING W REFLEX): HIV Screen 4th Generation wRfx: NONREACTIVE

## 2023-11-13 LAB — TROPONIN T, HIGH SENSITIVITY: Troponin T High Sensitivity: <15 ng/L (ref <19)

## 2023-11-13 LAB — GLUCOSE, CAPILLARY: Glucose-Capillary: 103 mg/dL — ABNORMAL HIGH (ref 70–99)

## 2023-11-13 LAB — LACTIC ACID, PLASMA: Lactic Acid, Venous: 0.7 mmol/L (ref 0.5–1.9)

## 2023-11-13 MED ORDER — BUSPIRONE HCL 5 MG PO TABS: 10.0000 mg | ORAL_TABLET | Freq: Two times a day (BID) | ORAL | Status: DC

## 2023-11-13 MED ORDER — VANCOMYCIN HCL IN DEXTROSE 1-5 GM/200ML-% IV SOLN
1000.0000 mg | Freq: Once | INTRAVENOUS | Status: AC
  Administered 2023-11-13: 1000 mg via INTRAVENOUS
  Filled 2023-11-13: qty 200

## 2023-11-13 MED ORDER — SODIUM CHLORIDE 0.9 % IV SOLN
2.0000 g | Freq: Once | INTRAVENOUS | Status: AC
  Administered 2023-11-13: 2 g via INTRAVENOUS
  Filled 2023-11-13: qty 12.5

## 2023-11-13 MED ORDER — SODIUM CHLORIDE 0.9 % IV SOLN
INTRAVENOUS | Status: AC
Start: 2023-11-13 — End: 2023-11-14

## 2023-11-13 MED ORDER — BUDESON-GLYCOPYRROL-FORMOTEROL 160-9-4.8 MCG/ACT IN AERO
2.0000 | INHALATION_SPRAY | Freq: Two times a day (BID) | RESPIRATORY_TRACT | Status: DC
  Administered 2023-11-14 – 2023-11-15 (×3): 2 via RESPIRATORY_TRACT
  Filled 2023-11-13 (×2): qty 5.9

## 2023-11-13 MED ORDER — VANCOMYCIN HCL IN DEXTROSE 1-5 GM/200ML-% IV SOLN: 1000.0000 mg | Freq: Once | INTRAVENOUS | Status: DC

## 2023-11-13 MED ORDER — HYDROXYZINE HCL 25 MG PO TABS
50.0000 mg | ORAL_TABLET | Freq: Once | ORAL | Status: AC
  Administered 2023-11-13: 50 mg via ORAL
  Filled 2023-11-13: qty 2

## 2023-11-13 MED ORDER — ONDANSETRON HCL 4 MG/2ML IJ SOLN
4.0000 mg | Freq: Four times a day (QID) | INTRAMUSCULAR | Status: DC | PRN
Start: 2023-11-13 — End: 2023-11-15
  Administered 2023-11-14: 4 mg via INTRAVENOUS
  Filled 2023-11-13: qty 2

## 2023-11-13 MED ORDER — ACETAMINOPHEN 325 MG PO TABS
650.0000 mg | ORAL_TABLET | Freq: Four times a day (QID) | ORAL | Status: DC | PRN
Start: 2023-11-13 — End: 2023-11-15

## 2023-11-13 MED ORDER — ONDANSETRON HCL 4 MG PO TABS: 4.0000 mg | ORAL_TABLET | Freq: Four times a day (QID) | ORAL | Status: DC | PRN

## 2023-11-13 MED ORDER — IPRATROPIUM-ALBUTEROL 0.5-2.5 (3) MG/3ML IN SOLN
3.0000 mL | Freq: Once | RESPIRATORY_TRACT | Status: AC
  Administered 2023-11-13: 3 mL via RESPIRATORY_TRACT
  Filled 2023-11-13: qty 3

## 2023-11-13 MED ORDER — NICOTINE 14 MG/24HR TD PT24
14.0000 mg | MEDICATED_PATCH | Freq: Every day | TRANSDERMAL | Status: DC
  Administered 2023-11-13 – 2023-11-15 (×3): 14 mg via TRANSDERMAL
  Filled 2023-11-13 (×3): qty 1

## 2023-11-13 MED ORDER — INSULIN ASPART 100 UNIT/ML IJ SOLN: 0.0000 [IU] | Freq: Three times a day (TID) | INTRAMUSCULAR | Status: DC

## 2023-11-13 MED ORDER — FENTANYL CITRATE PF 50 MCG/ML IJ SOSY
12.5000 ug | PREFILLED_SYRINGE | INTRAMUSCULAR | Status: DC | PRN
  Administered 2023-11-14: 50 ug via INTRAVENOUS
  Filled 2023-11-13: qty 1

## 2023-11-13 MED ORDER — CLONIDINE HCL 0.2 MG PO TABS
0.2000 mg | ORAL_TABLET | Freq: Two times a day (BID) | ORAL | Status: DC
  Administered 2023-11-13 – 2023-11-15 (×4): 0.2 mg via ORAL
  Filled 2023-11-13 (×6): qty 1

## 2023-11-13 MED ORDER — OXYCODONE-ACETAMINOPHEN 5-325 MG PO TABS
1.0000 | ORAL_TABLET | Freq: Four times a day (QID) | ORAL | Status: DC | PRN
  Administered 2023-11-13 – 2023-11-15 (×5): 1 via ORAL
  Filled 2023-11-13 (×8): qty 1

## 2023-11-13 MED ORDER — VANCOMYCIN HCL 1250 MG/250ML IV SOLN
1250.0000 mg | INTRAVENOUS | Status: DC
  Filled 2023-11-13: qty 250

## 2023-11-13 MED ORDER — GABAPENTIN 400 MG PO CAPS
400.0000 mg | ORAL_CAPSULE | Freq: Three times a day (TID) | ORAL | Status: DC
  Administered 2023-11-13 – 2023-11-15 (×4): 400 mg via ORAL
  Filled 2023-11-13 (×7): qty 1

## 2023-11-13 MED ORDER — HYDROXYZINE HCL 25 MG PO TABS
50.0000 mg | ORAL_TABLET | Freq: Three times a day (TID) | ORAL | Status: DC | PRN
  Administered 2023-11-14 – 2023-11-15 (×2): 50 mg via ORAL
  Filled 2023-11-13 (×4): qty 2

## 2023-11-13 MED ORDER — ROSUVASTATIN CALCIUM 10 MG PO TABS
10.0000 mg | ORAL_TABLET | Freq: Every day | ORAL | Status: DC
  Administered 2023-11-13 – 2023-11-15 (×3): 10 mg via ORAL
  Filled 2023-11-13 (×3): qty 1

## 2023-11-13 MED ORDER — METOPROLOL SUCCINATE ER 50 MG PO TB24
100.0000 mg | ORAL_TABLET | Freq: Every day | ORAL | Status: DC
  Administered 2023-11-13 – 2023-11-15 (×3): 100 mg via ORAL
  Filled 2023-11-13 (×3): qty 2

## 2023-11-13 MED ORDER — SODIUM CHLORIDE 0.9 % IV SOLN
2.0000 g | Freq: Three times a day (TID) | INTRAVENOUS | Status: DC
  Administered 2023-11-13 – 2023-11-14 (×2): 2 g via INTRAVENOUS
  Filled 2023-11-13 (×2): qty 12.5

## 2023-11-13 MED ORDER — HYDROMORPHONE HCL 1 MG/ML IJ SOLN
1.0000 mg | Freq: Once | INTRAMUSCULAR | Status: AC
  Administered 2023-11-13: 1 mg via INTRAVENOUS
  Filled 2023-11-13: qty 1

## 2023-11-13 MED ORDER — ACETAMINOPHEN 650 MG RE SUPP: 650.0000 mg | Freq: Four times a day (QID) | RECTAL | Status: DC | PRN

## 2023-11-13 MED ORDER — ONDANSETRON HCL 4 MG/2ML IJ SOLN
4.0000 mg | Freq: Once | INTRAMUSCULAR | Status: AC
  Administered 2023-11-13: 4 mg via INTRAVENOUS
  Filled 2023-11-13: qty 2

## 2023-11-13 MED ORDER — IOHEXOL 350 MG/ML SOLN
100.0000 mL | Freq: Once | INTRAVENOUS | Status: AC | PRN
  Administered 2023-11-13: 100 mL via INTRAVENOUS

## 2023-11-13 MED ORDER — VANCOMYCIN HCL 1250 MG/250ML IV SOLN
1250.0000 mg | INTRAVENOUS | Status: DC
  Administered 2023-11-13 – 2023-11-14 (×2): 1250 mg via INTRAVENOUS
  Filled 2023-11-13 (×3): qty 250

## 2023-11-13 NOTE — Progress Notes (Signed)
 Pharmacy Antibiotic Note  Melody Jenkins is a 58 y.o. female admitted on 11/13/2023 with L leg cellulitis vs abscess.  Pharmacy has been consulted for vancomycin & cefepime dosing.  Plan: Cefepime 2 gm IV q8hr Vancomycin 2 grams ordered at [facility] as linked order but only 1 gram given ( 2nd bag DC by EDP, pharmacist intended for dose to be 2 gm total)  Vancomycin 1 gm given in ED @ 1439 Vancomycin 1250 mg IV q24 for eAUC 539.4 using SCr 1.06, Vd 0.5, est Css min 12.4  Height: 5\' 7"  (170.2 cm) Weight: 90.7 kg (200 lb) IBW/kg (Calculated) : 61.6  Temp (24hrs), Avg:98.1 F (36.7 C), Min:98 F (36.7 C), Max:98.2 F (36.8 C)  Recent Labs  Lab 11/13/23 1129  WBC 7.8  CREATININE 1.06*  LATICACIDVEN 0.7    Estimated Creatinine Clearance: 66.9 mL/min (A) (by C-G formula based on SCr of 1.06 mg/dL (H)).    Allergies  Allergen Reactions   Iodinated Contrast Media Nausea And Vomiting   Trazodone  And Nefazodone Hives   Tape Rash    Thank you for allowing pharmacy to be a part of this patient's care.   Rubie Corona, Pharm.D Use secure chat for questions 11/13/2023 7:36 PM

## 2023-11-13 NOTE — Assessment & Plan Note (Signed)
Continue Crestor 10 mgpo q day

## 2023-11-13 NOTE — Assessment & Plan Note (Signed)
-   currently appears to be slightly on the dry side,  carefuly follow fluid status and Cr  

## 2023-11-13 NOTE — ED Provider Notes (Signed)
 [facility] AT [facility] Provider Note   CSN: [id number] Arrival date & time: 11/13/23  1105     History  No chief complaint on file.   Melody Jenkins is a 58 y.o. female.  The history is provided by the patient and medical records. No language interpreter was used.  Leg Pain Location:  Leg Time since incident:  2 weeks Injury: yes   Mechanism of injury: crush   Crush:    Mechanism: mower. Leg location:  L lower leg Pain details:    Quality:  Aching   Radiates to:  Does not radiate   Severity:  Severe   Onset quality:  Gradual   Duration:  4 weeks   Timing:  Constant   Progression:  Worsening Chronicity:  New Tetanus status:  Up to date Worsened by:  Bearing weight, extension and flexion Ineffective treatments:  Jenkins tried Associated symptoms: fatigue, swelling and tingling   Associated symptoms: no back pain, no fever and no neck pain   Associated symptoms comment:  Chills      Home Medications Prior to Admission medications   Medication Sig Start Date End Date Taking? Authorizing Provider  Blood Glucose Monitoring Suppl (BLOOD GLUCOSE SYSTEM PAK) KIT Please dispense based on patient and insurance preference. Use as directed to monitor FSBS 2x daily. Dx: E11.9 05/19/20   Mathis Som, MD  busPIRone  (BUSPAR ) 10 MG tablet Take 1 tablet (10 mg total) by mouth 2 (two) times daily. 11/08/22   Agarwal, [place], MD  Cholecalciferol (VITAMIN D3) 25 MCG (1000 UT) CAPS Take by mouth 2 (two) times daily.    [provider]  cloNIDine  (CATAPRES ) 0.2 MG tablet TAKE 1 TABLET TWICE DAILY 04/18/21   Arcadio Knuckles, MD  Cyanocobalamin (VITAMIN B 12 PO) Take 1 tablet by mouth daily.     [provider]  cyclobenzaprine  (FLEXERIL ) 5 MG tablet Take 1 tablet (5 mg total) by mouth 3 (three) times daily as needed for muscle spasms. 12/04/21   Arcadio Knuckles, MD  Dulaglutide  (TRULICITY ) 0.75 MG/0.5ML SOPN INJECT 0.75MG  (1 PEN) UNDER THE  SKIN EVERY WEEK 09/19/22   Arcadio Knuckles, MD  gabapentin  (NEURONTIN ) 300 MG capsule Take 600 mg by mouth 3 (three) times daily.    [provider]  Glucose Blood (BLOOD GLUCOSE TEST STRIPS) STRP Please dispense based on patient and insurance preference. Use as directed to monitor FSBS 2x daily. Dx: E11.9 05/19/20   Mathis Som, MD  hydrocortisone  2.5 % ointment Apply topically 2 (two) times daily. To affected areas on face 12/27/20   Arcadio Knuckles, MD  hydrOXYzine  (ATARAX ) 50 MG tablet Take 1 tablet (50 mg total) by mouth 3 (three) times daily. 11/08/22   Fulton Job, MD  indapamide  (LOZOL ) 1.25 MG tablet TAKE 1 TABLET(1.25 MG) BY MOUTH DAILY 10/04/22   Arcadio Knuckles, MD  Lancets MISC Please dispense based on patient and insurance preference. Use as directed to monitor FSBS 2x daily. Dx: E11.9 05/19/20   Mathis Som, MD  lidocaine  (XYLOCAINE ) 2 % jelly Apply 1 Application topically 3 (three) times daily as needed. Using Q-tip, apply dime sized amount to the affected area of the face and rub in. 05/09/22   Lamptey, Donley Furth, MD  meloxicam  (MOBIC ) 7.5 MG tablet TAKE 1 TABLET(7.5 MG) BY MOUTH DAILY 08/27/22   Arcadio Knuckles, MD  metoprolol  succinate (TOPROL -XL) 100 MG 24 hr tablet TAKE 1 TABLET (100 MG TOTAL) BY MOUTH  DAILY. TAKE WITH OR IMMEDIATELY FOLLOWING A MEAL. 04/07/23   Arcadio Knuckles, MD  mirtazapine  (REMERON ) 45 MG tablet Take 1 tablet (45 mg total) by mouth at bedtime. 11/08/22   Fulton Job, MD  naproxen  (NAPROSYN ) 500 MG tablet Take 1 tablet (500 mg total) by mouth 2 (two) times daily. 10/23/23   Levora Reas A, NP  oxyCODONE -acetaminophen  (PERCOCET) 5-325 MG tablet take 1 tablet by oral route  every 8 hours as needed. 09/12/21   [provider]  polyethylene glycol (MIRALAX ) 17 g packet Take 17 g by mouth 2 (two) times daily. Patient not taking: Reported on 11/08/2022 09/06/20   Ace Holder, MD  potassium chloride  (KLOR-CON ) 10 MEQ tablet TAKE 1  TABLET(10 MEQ) BY MOUTH THREE TIMES DAILY 07/25/22   Arcadio Knuckles, MD  rosuvastatin  (CRESTOR ) 10 MG tablet Take 1 tablet (10 mg total) by mouth daily. 04/23/22   Arcadio Knuckles, MD  TRELEGY ELLIPTA  200-62.5-25 MCG/ACT AEPB INHALE 1 PUFF INTO THE LUNGS DAILY 08/25/22   Arcadio Knuckles, MD      Allergies    Iodinated contrast media, Trazodone  and nefazodone, and Tape    Review of Systems   Review of Systems  Constitutional:  Positive for chills and fatigue. Negative for fever.  HENT:  Negative for congestion.   Respiratory:  Negative for cough, chest tightness and shortness of breath.   Cardiovascular:  Positive for leg swelling. Negative for chest pain and palpitations.  Gastrointestinal:  Negative for abdominal pain, constipation, diarrhea, nausea and vomiting.  Genitourinary:  Negative for dysuria and flank pain.  Musculoskeletal:  Negative for back pain, neck pain and neck stiffness.  Skin:  Positive for rash and wound.  Neurological:  Negative for light-headedness and headaches.  Psychiatric/Behavioral:  Negative for agitation and confusion.   All other systems reviewed and are negative.   Physical Exam Updated Vital Signs BP (!) 147/128   Pulse 90   Temp 98.2 F (36.8 C)   Resp 15   LMP 12/17/2017   SpO2 98%  Physical Exam Vitals and nursing note reviewed.  Constitutional:      Appearance: She is well-developed. She is not ill-appearing, toxic-appearing or diaphoretic.  HENT:     Head: Normocephalic and atraumatic.     Nose: No congestion or rhinorrhea.     Mouth/Throat:     Mouth: Mucous membranes are moist.     Pharynx: No oropharyngeal exudate or posterior oropharyngeal erythema.  Eyes:     Extraocular Movements: Extraocular movements intact.     Conjunctiva/sclera: Conjunctivae normal.     Pupils: Pupils are equal, round, and reactive to light.  Cardiovascular:     Rate and Rhythm: Regular rhythm. Tachycardia present.     Heart sounds: No murmur  heard. Pulmonary:     Effort: Pulmonary effort is normal. No respiratory distress.     Breath sounds: Normal breath sounds. No wheezing, rhonchi or rales.  Chest:     Chest wall: No tenderness.  Abdominal:     General: Abdomen is flat.     Palpations: Abdomen is soft.     Tenderness: There is no abdominal tenderness. There is no right CVA tenderness, left CVA tenderness, guarding or rebound.  Musculoskeletal:        General: Swelling and tenderness present.     Cervical back: Neck supple. No tenderness.     Left lower leg: Swelling, laceration (woujnd present) and tenderness present.       Legs:  Skin:    General: Skin is warm and dry.     Capillary Refill: Capillary refill takes less than 2 seconds.     Findings: Bruising and erythema present.  Neurological:     General: No focal deficit present.     Mental Status: She is alert.     Sensory: No sensory deficit.     Motor: No weakness.  Psychiatric:        Mood and Affect: Mood normal.     ED Results / Procedures / Treatments   Labs (all labs ordered are listed, but only abnormal results are displayed) Labs Reviewed  CBC WITH DIFFERENTIAL/PLATELET - Abnormal; Notable for the following components:      Result Value   RBC 5.27 (*)    All other components within normal limits  COMPREHENSIVE METABOLIC PANEL WITH GFR - Abnormal; Notable for the following components:   Creatinine, Ser 1.06 (*)    Total Protein 6.2 (*)    All other components within normal limits  CULTURE, BLOOD (ROUTINE X 2)  CULTURE, BLOOD (ROUTINE X 2)  LACTIC ACID, PLASMA  CK  TROPONIN T, HIGH SENSITIVITY    EKG EKG Interpretation Date/Time:  Wednesday November 13 2023 11:52:05 EDT Ventricular Rate:  67 PR Interval:  230 QRS Duration:  89 QT Interval:  430 QTC Calculation: 454 R Axis:   16  Text Interpretation: Sinus rhythm Prolonged PR interval Borderline T abnormalities, anterior leads no prior ECG for comparison NO STEMI Confirmed by Wynell Heath ([id number]) on 11/13/2023 12:39:54 PM  Radiology CT EXTREMITY LOWER LEFT W CONTRAST Result Date: 11/13/2023 CLINICAL DATA:  Swelling post crush injury. Same-day ultrasound demonstrating a subcutaneous fluid collection in the anterior left lower extremity. EXAM: CT OF THE LOWER LEFT EXTREMITY WITH CONTRAST TECHNIQUE: Multidetector CT imaging of the lower left extremity was performed according to the standard protocol following intravenous contrast administration. RADIATION DOSE REDUCTION: This exam was performed according to the departmental dose-optimization program which includes automated exposure control, adjustment of the mA and/or kV according to patient size and/or use of iterative reconstruction technique. CONTRAST:  OMNIPAQUE IOHEXOL 350 MG/ML SOLN COMPARISON:  Same day left lower extremity venous Doppler ultrasound. Radiographs dated 11/03/2023. FINDINGS: Bones/Joint/Cartilage No acute fracture or dislocation. Normal alignment. Mild-to-moderate medial and lateral femorotibial compartment joint space narrowing and marginal osteophytosis. Mild degenerative changes of the hindfoot and midfoot. Ligaments Ligaments are suboptimally evaluated by CT. Muscles and Tendons Muscles are normal. No muscle atrophy. No intramuscular fluid collection or hematoma. Soft tissue 3.8 x 2.8 x 1.2 cm somewhat ill-defined partially loculated appearing hypodense fluid of indeterminate sterility within the subcutaneous soft tissues of the proximal anterior shin, likely corresponds to the previously identified subcutaneous fluid collection on the same-day ultrasound, and may reflect organizing phlegmon versus early abscess formation. There is surrounding fat stranding with overlying cutaneous thickening and irregularity. No soft tissue gas. No radiopaque foreign body. Diffuse nonspecific subcutaneous edema of the calf extending through the ankle. IMPRESSION: 1. 3.8 x 2.8 x 1.2 cm partially loculated appearing hypodense fluid  of indeterminate sterility within the subcutaneous soft tissues of the proximal anterior shin, likely corresponds to the previously identified subcutaneous fluid collection on the same-day ultrasound, and may reflect organizing phlegmon versus early abscess formation. Surrounding fat stranding with overlying cutaneous thickening could reflect cellulitis in the appropriate setting. No soft tissue gas. 2. No acute osseous abnormality. 3. 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 Electronically Signed   By: Arcola Beards.D.  On: 11/13/2023 13:50   US  Venous Img Lower Unilateral Left (DVT) Result Date: 11/13/2023 CLINICAL DATA:  Swelling post crush injury. EXAM: LEFT LOWER EXTREMITY VENOUS DOPPLER ULTRASOUND TECHNIQUE: Gray-scale sonography with compression, as well as color and duplex ultrasound, were performed to evaluate the deep venous system(s) from the level of the common femoral vein through the popliteal and proximal calf veins. COMPARISON:  11/03/2023 FINDINGS: VENOUS Normal compressibility of the common femoral, superficial femoral, and popliteal veins, as well as the visualized calf veins. Visualized portions of profunda femoral vein and great saphenous vein unremarkable. No filling defects to suggest DVT on grayscale or color Doppler imaging. Doppler waveforms show normal direction of venous flow, normal respiratory plasticity and response to augmentation. Limited views of the contralateral common femoral vein are unremarkable. OTHER Subcutaneous edema in the ankle. Subcentimeter morphologically unremarkable left inguinal lymph nodes incidentally noted. 4.7 x 1.1 x 4.3 cm deep subcutaneous fluid collection, anterior calf. Limitations: Jenkins IMPRESSION: 1. Negative for DVT. 2. 4.7 cm deep subcutaneous fluid collection, anterior calf. Electronically Signed   By: Nicoletta Barrier M.D.   On: 11/13/2023 10:02    Procedures Procedures     CRITICAL CARE Performed by:  Marine Sia Quinlyn Tep Total critical care time: 25 minutes Critical care time was exclusive of separately billable procedures and treating other patients. Critical care was necessary to treat or prevent imminent or life-threatening deterioration. Critical care was time spent personally by me on the following activities: development of treatment plan with patient and/or surrogate as well as nursing, discussions with consultants, evaluation of patient's response to treatment, examination of patient, obtaining history from patient or surrogate, ordering and performing treatments and interventions, ordering and review of laboratory studies, ordering and review of radiographic studies, pulse oximetry and re-evaluation of patient's condition.   Medications Ordered in ED Medications  ceFEPIme (MAXIPIME) 2 g in sodium chloride  0.9 % 100 mL IVPB (has no administration in time range)  vancomycin (VANCOCIN) IVPB 1000 mg/200 mL premix (has no administration in time range)    Followed by  vancomycin (VANCOCIN) IVPB 1000 mg/200 mL premix (has no administration in time range)  HYDROmorphone (DILAUDID) injection 1 mg (1 mg Intravenous Given 11/13/23 1201)  ondansetron (ZOFRAN) injection 4 mg (4 mg Intravenous Given 11/13/23 1202)  hydrOXYzine  (ATARAX ) tablet 50 mg (50 mg Oral Given 11/13/23 1201)  iohexol (OMNIPAQUE) 350 MG/ML injection 100 mL (100 mLs Intravenous Contrast Given 11/13/23 1249)  ipratropium-albuterol  (DUONEB) 0.5-2.5 (3) MG/3ML nebulizer solution 3 mL (3 mLs Nebulization Given 11/13/23 1315)    ED Course/ Medical Decision Making/ A&P                                 Medical Decision Making Amount and/or Complexity of Data Reviewed Labs: ordered. Radiology: ordered.  Risk Prescription drug management.    FANCY DUNKLEY is a 58 y.o. female with a past medical history significant for diabetes, hypertension, asthma, obesity, hyperlipidemia, and recent crush injury to her left shin who presents  with worsening left shin pain.  Patient reports she presented overnight with worsening pain for the last few days and had an ultrasound ordered to be done today to look for DVT.  She had a negative ultrasound a week and a half ago that did not show DVT or other abnormality.  Patient reports the last 2 days the pain is worsened it is now swelling, there is erythema redness and the patient  is having some fatigue and chills.  She reports she is having some tingling in her leg and has edema in the left leg.  She denies history of blood clots.  She denies any new trauma.  She reports that she is having worsening pain.  She presented back to get the ultrasound done and after was completed, I went to talk to her about the results of the ultrasound as an outpatient and I am concerned that it showed a new fluid pocket that was not seen on previous ultrasound.  I assessed patient in triage and determine she needs further evaluation.  On my exam she does indeed have significant tenderness and swelling to the left leg and near a wound on her shin.  There is no purulence seen but it is tender and swollen.  There is no crepitance but there is some diffuse swelling and tenderness.  No palpable fluctuance initially on my exam.  Back of the leg slightly tender.  Intact pulses strength and sensation.  She was not numb on my exam.  Rest of exam reassuring.  Lungs clear and chest nontender.  Abdomen nontender.  Clinically I am concerned about this fluid pocket being abscess versus hematoma.  I was not seen after the initial injury on previous ultrasound I am concerned it is new.  Given the patient's reported symptoms really worsening of the last few days I am worried about diabetic wound infection.  We agreed to get labs give her pain medicine and get a CT scan to further evaluate for deep abscess or osteomyelitis or other abnormality.  Patient says that she has tolerated contrast in the past and only had possibly mild nausea.   She does not want steroids and does not want to do a 4-hour pretreatment protocol for contrast at this time.   1:07 PM After returning from CT scan, patient is reporting some chest tightness.  She does not think it allergic reaction because she does not have any itching, lip swelling, tongue swelling, throat swelling or nausea although she just feels some tightness in her chest.  She says this all feels when she gets asthma exacerbation.  We will give her a DuoNeb.  I listen to her lungs and I did not hear significant wheezing.  She does not think anything is swelling.  She did not want me to give steroids at this time.  Will give her breathing treatment and see how she is feeling.  If she needs we can escalate to steroids, histamines, or possibly even epinephrine if things worsen.  2:07 PM CT scan returned and does show evidence of phlegmon versus early abscess and cellulitis.  Due to the patient's diabetes and acute worsening of her last few days with her significant pain I do not feel she is safe to discharge home.  Will do IV antibiotics and have her admitted to hospital for monitoring of leg, pain control, and making sure this turns green with IV antibiotics and does not need surgical drainage given the nature of this early infection.  Patient agrees with this.  Will let her eat and drink now as I do not feel she needs operative management currently.         Final Clinical Impression(s) / ED Diagnoses Final diagnoses:  Cellulitis of left lower extremity  Phlegmonous cellulitis   Clinical Impression: 1. Cellulitis of left lower extremity   2. Phlegmonous cellulitis     Disposition: Admit  This note was prepared with assistance of Dragon  voice recognition software. Occasional wrong-word or sound-a-like substitutions may have occurred due to the inherent limitations of voice recognition software.        Tamantha Saline, Marine Sia, MD 11/13/23 1414

## 2023-11-13 NOTE — Subjective & Objective (Signed)
 coming in with Left leg pain and swelling. She had a Hx of crush injury on left shin on may 23 by lawn mower US  was negative on follow up  that time,  Patient came in last night with worsening pain swelling and skin changes, repeat DVT duplex done- no dvt but 4.7 cm deep subcutaneous fluid collection in the anterior calf. Patient is diabetic  CT done  w/>3.8 x 2.8 x 1.2 cm   may reflect organizing phlegmon versus early abscess formation.?cellulitis  No soft tissue gas. No acute osseous abnormality.   Patient was transferred from [facility] to [facility] for diabetic wound infection  Of note she complained of Chest tightness after CT scan but  troponin negative.  Vancomycin and cefepime started

## 2023-11-13 NOTE — H&P (Addendum)
 Melody Jenkins NWG:[id number] DOB: November 15, 1965 DOA: 11/13/2023     PCP: [facility]   Outpatient Specialists:   CARDS:  Dr. Sonny Dust, MD (Inactive)    Patient arrived to ER on 11/13/23 at 1105 Referred by Attending Default, Provider, MD   Patient coming from:    home Lives alone, daughter comes to visit    Chief Complaint:   Chief Complaint  Patient presents with   Leg Swelling    HPI: Melody Jenkins is a 58 y.o. female with medical history significant of DM2, HTN, HLD , depression, anxiety and PTSD, neuropathy, tobacco abuse    Presented with 63-week-old left leg wound On May 23rd she was cutting the grass, she was too tired her arms gave out and lawn mover drove forward and pinned her her leg between the machine and the deck    coming in with Left leg pain and swelling. She had a Hx of crush injury on left shin on may 23 by lawn mower US  was negative on follow up  that time,  Patient came in last night with worsening pain swelling and skin changes, repeat DVT duplex done- no dvt but 4.7 cm deep subcutaneous fluid collection in the anterior calf. Patient is diabetic  CT done  w/>3.8 x 2.8 x 1.2 cm   may reflect organizing phlegmon versus early abscess formation.?cellulitis  No soft tissue gas. No acute osseous abnormality.   Patient was transferred from [facility] to [facility] for diabetic wound infection  Of note she complained of Chest tightness after CT scan but  troponin negative.  Vancomycin and cefepime started Endorses fatigue and chills She is on pain management Takes Neurontin   Denies significant ETOH intake   Does smoke  but interested in quitting   Denies marijuana use      Regarding pertinent Chronic problems:     Hyperlipidemia - on statins Crestor  Lipid Panel     Component Value Date/Time   CHOL 162 04/23/2022 1412   TRIG 215.0 (H) 04/23/2022 1412   HDL 34.70 (L) 04/23/2022 1412   CHOLHDL 5 04/23/2022 1412   VLDL 43.0 (H) 04/23/2022  1412   LDLCALC 176 (H) 04/05/2021 0902   LDLCALC 88 01/13/2020 0832   LDLDIRECT 95.0 04/23/2022 1412     HTN on Toprol  and clonidine    chronic CHF diastolic - last echo  Recent Results (from the past 57846 hours)  ECHOCARDIOGRAM COMPLETE   Collection Time: 10/07/20 10:59 AM  Result Value   Area-P 1/2 3.65   S' Lateral 3.30   Narrative      ECHOCARDIOGRAM REPORT      1. Left ventricular ejection fraction, by estimation, is 55 to 60%. Left ventricular ejection fraction by 3D volume is 55 %. The left ventricle has normal function. The left ventricle has no regional wall motion abnormalities. Left ventricular diastolic  parameters are consistent with Grade I diastolic dysfunction (impaired relaxation). The average left ventricular global longitudinal strain is -24.0 %. The global longitudinal strain is normal.  2. Right ventricular systolic function is normal. The right ventricular size is normal.  3. Left atrial size was mildly dilated.  4. The mitral valve is normal in structure. No evidence of mitral valve regurgitation. No evidence of mitral stenosis.  5. The aortic valve is normal in structure. Aortic valve regurgitation is not visualized. No aortic stenosis is present.  6. The inferior vena cava is normal in size with greater than 50% respiratory variability, suggesting right  atrial pressure of 3 mmHg.          DM 2 -  Lab Results  Component Value Date   HGBA1C 6.3 04/23/2022   on Trulicity     obesity-   BMI Readings from Last 1 Encounters:  11/13/23 31.32 kg/m       Asthma -well   controlled on home inhalers/ nebs        While in ER:     Ultrasound showed no evidence of DVT CT scan showing no evidence of abscess Started on vancomycin and cefepime  Lab Orders         Blood culture (routine x 2)         CBC with Differential         Comprehensive metabolic panel         CK     Ultrasound negative for DVT 4.7 cm deep subcutaneous fluid collection on anterior  calf  CT left leg showed 3.8 x 2.8 x 1.2 cm organizing phlegmon versus early abscess formation.    Following Medications were ordered in ER: Medications  vancomycin (VANCOCIN) IVPB 1000 mg/200 mL premix (0 mg Intravenous Stopped 11/13/23 1655)    Followed by  vancomycin (VANCOCIN) IVPB 1000 mg/200 mL premix (1,000 mg Intravenous Not Given 11/13/23 1447)  HYDROmorphone (DILAUDID) injection 1 mg (1 mg Intravenous Given 11/13/23 1201)  ondansetron (ZOFRAN) injection 4 mg (4 mg Intravenous Given 11/13/23 1202)  hydrOXYzine  (ATARAX ) tablet 50 mg (50 mg Oral Given 11/13/23 1201)  iohexol (OMNIPAQUE) 350 MG/ML injection 100 mL (100 mLs Intravenous Contrast Given 11/13/23 1249)  ipratropium-albuterol  (DUONEB) 0.5-2.5 (3) MG/3ML nebulizer solution 3 mL (3 mLs Nebulization Given 11/13/23 1315)  ceFEPIme (MAXIPIME) 2 g in sodium chloride  0.9 % 100 mL IVPB (0 g Intravenous Stopped 11/13/23 1533)    _______  ED Triage Vitals  Encounter Vitals Group     BP 11/13/23 1108 (!) 167/89     Systolic BP Percentile --      Diastolic BP Percentile --      Pulse Rate 11/13/23 1108 79     Resp 11/13/23 1108 20     Temp 11/13/23 1108 98.2 F (36.8 C)     Temp Source 11/13/23 1553 Oral     SpO2 11/13/23 1108 97 %     Weight 11/13/23 1809 200 lb (90.7 kg)     Height 11/13/23 1809 5\' 7"  (1.702 m)     Head Circumference --      Peak Flow --      Pain Score 11/13/23 1109 9     Pain Loc --      Pain Education --      Exclude from Growth Chart --   UJWJ(19)@     _________________________________________ Significant initial  Findings: Abnormal Labs Reviewed  CBC WITH DIFFERENTIAL/PLATELET - Abnormal; Notable for the following components:      Result Value   RBC 5.27 (*)    All other components within normal limits  COMPREHENSIVE METABOLIC PANEL WITH GFR - Abnormal; Notable for the following components:   Creatinine, Ser 1.06 (*)    Total Protein 6.2 (*)    All other components within normal limits       _________________________ Troponin  ordered less than 15 Cardiac Panel (last 3 results) Recent Labs    11/13/23 1129  CKTOTAL 121     ECG: Ordered Personally reviewed and interpreted by me showing: HR : 67 Rhythm: Sinus rhythm Prolonged PR interval Borderline T abnormalities,  anterior leads no prior ECG for comparison NO STEMI  QTC 454   The recent clinical data is shown below. Vitals:   11/13/23 1530 11/13/23 1553 11/13/23 1600 11/13/23 1809  BP: (!) 135/102  130/64 (!) 132/94  Pulse: (!) 56   71  Resp: 11  11 20   Temp:  98 F (36.7 C) 98.1 F (36.7 C) 98.2 F (36.8 C)  TempSrc:  Oral Oral Oral  SpO2: 97%  98% 98%  Weight:    90.7 kg  Height:    5\' 7"  (1.702 m)    WBC     Component Value Date/Time   WBC 7.8 11/13/2023 1129   LYMPHSABS 3.3 11/13/2023 1129   MONOABS 0.6 11/13/2023 1129   EOSABS 0.2 11/13/2023 1129   BASOSABS 0.0 11/13/2023 1129     Lactic Acid, Venous    Component Value Date/Time   LATICACIDVEN 0.7 11/13/2023 1129       Results for orders placed or performed in visit on 03/01/22  SURESWAB CT/NG/T. vaginalis     Status: None   Collection Time: 03/01/22 12:24 PM   Specimen: Genital  Result Value Ref Range Status   C. trachomatis RNA, TMA NOT DETECTED NOT DETECTED Final   N. gonorrhoeae RNA, TMA NOT DETECTED NOT DETECTED Final   Trichomonas vaginalis RNA NOT DETECTED NOT DETECTED Final    Comment: For additional information, please refer to http://education.[employer].com/ faq/Trichomonastma (This link is being provided for informational/ educational purposes only.) . The analytical performance characteristics of this assay, when used to test SurePath(TM) specimens have been determined by [employer]. The modifications have not been cleared or approved by the FDA. This assay has been validated pursuant to the CLIA regulations and is used for clinical purposes. . For additional information, please refer  to https://education.[employer].com/faq/FAQ154 (This link is being provided for information/ educational purposes only.) .     ABX started Antibiotics Given (last 72 hours)     Date/Time Action Medication Dose Rate   11/13/23 1437 New Bag/Given   ceFEPIme (MAXIPIME) 2 g in sodium chloride  0.9 % 100 mL IVPB 2 g 200 mL/hr   11/13/23 1439 New Bag/Given   vancomycin (VANCOCIN) IVPB 1000 mg/200 mL premix 1,000 mg 200 mL/hr         __________________________________________________________ Recent Labs  Lab 11/13/23 1129  NA 144  K 4.0  CO2 29  GLUCOSE 83  BUN 19  CREATININE 1.06*  CALCIUM  9.8    Cr   Up from baseline see below Lab Results  Component Value Date   CREATININE 1.06 (H) 11/13/2023   CREATININE 0.81 04/23/2022   CREATININE 0.89 10/04/2021    Recent Labs  Lab 11/13/23 1129  AST 21  ALT 20  ALKPHOS 70  BILITOT 0.3  PROT 6.2*  ALBUMIN 3.6   Lab Results  Component Value Date   CALCIUM  9.8 11/13/2023        Plt: Lab Results  Component Value Date   PLT 245 11/13/2023      Recent Labs  Lab 11/13/23 1129  WBC 7.8  NEUTROABS 3.7  HGB 14.4  HCT 45.2  MCV 85.8  PLT 245    HG/HCT  stable,     Component Value Date/Time   HGB 14.4 11/13/2023 1129   HCT 45.2 11/13/2023 1129   MCV 85.8 11/13/2023 1129    _______________________________________________ Hospitalist was called for admission for   Cellulitis of left lower extremity   Phlegmonous cellulitis    The following Work up has  been ordered so far:  Orders Placed This Encounter  Procedures   Blood culture (routine x 2)   CT EXTREMITY LOWER LEFT W CONTRAST   CBC with Differential   Comprehensive metabolic panel   CK   Diet NPO time specified   ED Cardiac monitoring   Consult to hospitalist   ED EKG   ED EKG   Admit to Inpatient (patient's expected length of stay will be greater than 2 midnights or inpatient only procedure)   Admit to Inpatient (patient's expected length  of stay will be greater than 2 midnights or inpatient only procedure)     OTHER Significant initial  Findings:  labs showing:     DM  labs:  HbA1C: No results for input(s): "HGBA1C" in the last 8760 hours.     CBG (last 3)  No results for input(s): "GLUCAP" in the last 72 hours.        Cultures:    Component Value Date/Time   SDES URINE, CLEAN CATCH 06/15/2008 1652   SPECREQUEST NONE 06/15/2008 1652   CULT ESCHERICHIA COLI 06/15/2008 1652   REPTSTATUS 06/18/2008 FINAL 06/15/2008 1652     Radiological Exams on Admission: CT EXTREMITY LOWER LEFT W CONTRAST Result Date: 11/13/2023 CLINICAL DATA:  Swelling post crush injury. Same-day ultrasound demonstrating a subcutaneous fluid collection in the anterior left lower extremity. EXAM: CT OF THE LOWER LEFT EXTREMITY WITH CONTRAST TECHNIQUE: Multidetector CT imaging of the lower left extremity was performed according to the standard protocol following intravenous contrast administration. RADIATION DOSE REDUCTION: This exam was performed according to the departmental dose-optimization program which includes automated exposure control, adjustment of the mA and/or kV according to patient size and/or use of iterative reconstruction technique. CONTRAST:  OMNIPAQUE IOHEXOL 350 MG/ML SOLN COMPARISON:  Same day left lower extremity venous Doppler ultrasound. Radiographs dated 11/03/2023. FINDINGS: Bones/Joint/Cartilage No acute fracture or dislocation. Normal alignment. Mild-to-moderate medial and lateral femorotibial compartment joint space narrowing and marginal osteophytosis. Mild degenerative changes of the hindfoot and midfoot. Ligaments Ligaments are suboptimally evaluated by CT. Muscles and Tendons Muscles are normal. No muscle atrophy. No intramuscular fluid collection or hematoma. Soft tissue 3.8 x 2.8 x 1.2 cm somewhat ill-defined partially loculated appearing hypodense fluid of indeterminate sterility within the subcutaneous soft tissues  of the proximal anterior shin, likely corresponds to the previously identified subcutaneous fluid collection on the same-day ultrasound, and may reflect organizing phlegmon versus early abscess formation. There is surrounding fat stranding with overlying cutaneous thickening and irregularity. No soft tissue gas. No radiopaque foreign body. Diffuse nonspecific subcutaneous edema of the calf extending through the ankle. IMPRESSION: 1. 3.8 x 2.8 x 1.2 cm partially loculated appearing hypodense fluid of indeterminate sterility within the subcutaneous soft tissues of the proximal anterior shin, likely corresponds to the previously identified subcutaneous fluid collection on the same-day ultrasound, and may reflect organizing phlegmon versus early abscess formation. Surrounding fat stranding with overlying cutaneous thickening could reflect cellulitis in the appropriate setting. No soft tissue gas. 2. No acute osseous abnormality. 3. 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 Electronically Signed   By: Mannie Seek M.D.   On: 11/13/2023 13:50   US  Venous Img Lower Unilateral Left (DVT) Result Date: 11/13/2023 CLINICAL DATA:  Swelling post crush injury. EXAM: LEFT LOWER EXTREMITY VENOUS DOPPLER ULTRASOUND TECHNIQUE: Gray-scale sonography with compression, as well as color and duplex ultrasound, were performed to evaluate the deep venous system(s) from the level of the common femoral vein through the popliteal and proximal  calf veins. COMPARISON:  11/03/2023 FINDINGS: VENOUS Normal compressibility of the common femoral, superficial femoral, and popliteal veins, as well as the visualized calf veins. Visualized portions of profunda femoral vein and great saphenous vein unremarkable. No filling defects to suggest DVT on grayscale or color Doppler imaging. Doppler waveforms show normal direction of venous flow, normal respiratory plasticity and response to augmentation. Limited views of  the contralateral common femoral vein are unremarkable. OTHER Subcutaneous edema in the ankle. Subcentimeter morphologically unremarkable left inguinal lymph nodes incidentally noted. 4.7 x 1.1 x 4.3 cm deep subcutaneous fluid collection, anterior calf. Limitations: none IMPRESSION: 1. Negative for DVT. 2. 4.7 cm deep subcutaneous fluid collection, anterior calf. Electronically Signed   By: Nicoletta Barrier M.D.   On: 11/13/2023 10:02   _______________________________________________________________________________________________________ Latest  Blood pressure (!) 132/94, pulse 71, temperature 98.2 F (36.8 C), temperature source Oral, resp. rate 20, height 5\' 7"  (1.702 m), weight 90.7 kg, last menstrual period 12/17/2017, SpO2 98%.   Vitals  labs and radiology finding personally reviewed  Review of Systems:    Pertinent positives include:    chills, fatigue Constitutional:  No weight loss, night sweats, Fevers,, weight loss  HEENT:  No headaches, Difficulty swallowing,Tooth/dental problems,Sore throat,  No sneezing, itching, ear ache, nasal congestion, post nasal drip,  Cardio-vascular:  No chest pain, Orthopnea, PND, anasarca, dizziness, palpitations.no Bilateral lower extremity swelling  GI:  No heartburn, indigestion, abdominal pain, nausea, vomiting, diarrhea, change in bowel habits, loss of appetite, melena, blood in stool, hematemesis Resp:  no shortness of breath at rest. No dyspnea on exertion, No excess mucus, no productive cough, No non-productive cough, No coughing up of blood.No change in color of mucus.No wheezing. Skin:  no rash or lesions. No jaundice GU:  no dysuria, change in color of urine, no urgency or frequency. No straining to urinate.  No flank pain.  Musculoskeletal:  No joint pain or no joint swelling. No decreased range of motion. No back pain.  Psych:  No change in mood or affect. No depression or anxiety. No memory loss.  Neuro: no localizing neurological  complaints, no tingling, no weakness, no double vision, no gait abnormality, no slurred speech, no confusion  All systems reviewed and apart from HOPI all are negative _______________________________________________________________________________________________ Past Medical History:   Past Medical History:  Diagnosis Date   Anemia    Anxiety    Arthritis    Asthma    Carpal tunnel syndrome    Chronic back pain    Depression    Diabetes mellitus without complication (HCC)    Essential hypertension    Heart murmur    History of diabetes mellitus    History of multiple miscarriages    Hyperlipidemia    Torn rotator cuff       Past Surgical History:  Procedure Laterality Date   BREAST SURGERY  2000   reduction   CARPAL TUNNEL RELEASE     left   CESAREAN SECTION     COSMETIC SURGERY  2000   breast reduction   REDUCTION MAMMAPLASTY Bilateral    ROTATOR CUFF REPAIR  10/2019    Social History:  Ambulatory   independently  or cane      reports that she has been smoking cigarettes. She has a 15 pack-year smoking history. She has never used smokeless tobacco. She reports that she does not drink alcohol and does not use drugs.     Family History:   Family History  Problem Relation Age of  Onset   Diabetes Mother    Hypertension Mother    Breast cancer Mother 25   Hypertension Father    Heart disease Father    Early death Brother    Kidney disease Maternal Grandmother    Heart disease Brother    Colon cancer Neg Hx    Rectal cancer Neg Hx    Esophageal cancer Neg Hx    Pancreatic cancer Neg Hx    Liver disease Neg Hx    Stomach cancer Neg Hx    ______________________________________________________________________________________________ Allergies: Allergies  Allergen Reactions   Iodinated Contrast Media Nausea And Vomiting   Trazodone  And Nefazodone Hives   Tape Rash     Prior to Admission medications   Medication Sig Start Date End Date Taking?  Authorizing Provider  desvenlafaxine  (PRISTIQ ) 25 MG 24 hr tablet Take 25 mg by mouth daily. 09/23/23  Yes [provider]  Blood Glucose Monitoring Suppl (BLOOD GLUCOSE SYSTEM PAK) KIT Please dispense based on patient and insurance preference. Use as directed to monitor FSBS 2x daily. Dx: E11.9 05/19/20   Mathis Som, MD  busPIRone  (BUSPAR ) 10 MG tablet Take 1 tablet (10 mg total) by mouth 2 (two) times daily. 11/08/22   Agarwal, [place], MD  Cholecalciferol (VITAMIN D3) 25 MCG (1000 UT) CAPS Take by mouth 2 (two) times daily.    [provider]  cloNIDine  (CATAPRES ) 0.2 MG tablet TAKE 1 TABLET TWICE DAILY 04/18/21   Arcadio Knuckles, MD  Cyanocobalamin (VITAMIN B 12 PO) Take 1 tablet by mouth daily.     [provider]  cyclobenzaprine  (FLEXERIL ) 5 MG tablet Take 1 tablet (5 mg total) by mouth 3 (three) times daily as needed for muscle spasms. 12/04/21   Arcadio Knuckles, MD  Dulaglutide  (TRULICITY ) 0.75 MG/0.5ML SOPN INJECT 0.75MG  (1 PEN) UNDER THE SKIN EVERY WEEK 09/19/22   Arcadio Knuckles, MD  gabapentin  (NEURONTIN ) 300 MG capsule Take 600 mg by mouth 3 (three) times daily.    [provider]  Glucose Blood (BLOOD GLUCOSE TEST STRIPS) STRP Please dispense based on patient and insurance preference. Use as directed to monitor FSBS 2x daily. Dx: E11.9 05/19/20   Mathis Som, MD  hydrocortisone  2.5 % ointment Apply topically 2 (two) times daily. To affected areas on face 12/27/20   Arcadio Knuckles, MD  hydrOXYzine  (ATARAX ) 50 MG tablet Take 1 tablet (50 mg total) by mouth 3 (three) times daily. 11/08/22   Fulton Job, MD  indapamide  (LOZOL ) 1.25 MG tablet TAKE 1 TABLET(1.25 MG) BY MOUTH DAILY 10/04/22   Arcadio Knuckles, MD  Lancets MISC Please dispense based on patient and insurance preference. Use as directed to monitor FSBS 2x daily. Dx: E11.9 05/19/20   Mathis Som, MD  lidocaine  (XYLOCAINE ) 2 % jelly Apply 1 Application topically 3 (three) times daily  as needed. Using Q-tip, apply dime sized amount to the affected area of the face and rub in. 05/09/22   Lamptey, Donley Furth, MD  meloxicam  (MOBIC ) 7.5 MG tablet TAKE 1 TABLET(7.5 MG) BY MOUTH DAILY 08/27/22   Arcadio Knuckles, MD  metoprolol  succinate (TOPROL -XL) 100 MG 24 hr tablet TAKE 1 TABLET (100 MG TOTAL) BY MOUTH DAILY. TAKE WITH OR IMMEDIATELY FOLLOWING A MEAL. 04/07/23   Arcadio Knuckles, MD  mirtazapine  (REMERON ) 45 MG tablet Take 1 tablet (45 mg total) by mouth at bedtime. 11/08/22   Fulton Job, MD  naproxen  (NAPROSYN ) 500 MG tablet Take 1 tablet (500 mg total)  by mouth 2 (two) times daily. 10/23/23   Levora Reas A, NP  oxyCODONE -acetaminophen  (PERCOCET) 5-325 MG tablet take 1 tablet by oral route  every 8 hours as needed. 09/12/21   [provider]  polyethylene glycol (MIRALAX ) 17 g packet Take 17 g by mouth 2 (two) times daily. 09/06/20   Armbruster, Lendon Queen, MD  potassium chloride  (KLOR-CON ) 10 MEQ tablet TAKE 1 TABLET(10 MEQ) BY MOUTH THREE TIMES DAILY 07/25/22   Arcadio Knuckles, MD  rosuvastatin  (CRESTOR ) 10 MG tablet Take 1 tablet (10 mg total) by mouth daily. 04/23/22   Arcadio Knuckles, MD  TRELEGY ELLIPTA  200-62.5-25 MCG/ACT AEPB INHALE 1 PUFF INTO THE LUNGS DAILY 08/25/22   Arcadio Knuckles, MD    ___________________________________________________________________________________________________ Physical Exam:    11/13/2023    6:09 PM 11/13/2023    4:00 PM 11/13/2023    3:30 PM  Vitals with BMI  Height 5\' 7"     Weight 200 lbs    BMI 31.32    Systolic 132 130 161  Diastolic 94 64 102  Pulse 71  56     1. General:  in No  Acute distress   Chronically ill   -appearing 2. Psychological: Alert and   Oriented 3. Head/ENT:   Moist  Mucous Membranes                          Head Non traumatic, neck supple                           Poor Dentition 4. SKIN: normal   Skin turgor,  Skin clean Dry  wound present    5. Heart: Regular rate and rhythm no  Murmur, no Rub or  gallop 6. Lungs:  no wheezes or crackles   7. Abdomen: Soft,  non-tender, Non distended   obese  bowel sounds present 8. Lower extremities: no clubbing, cyanosis, no  edema 9. Neurologically Grossly intact, moving all 4 extremities equally  10. MSK: Normal range of motion    Chart has been reviewed  ______________________________________________________________________________________________  Assessment/Plan 58 y.o. female with medical history significant of DM2, HTN, HLD , depression, anxiety and PTSD, neuropathy, tobacco abuse   Admitted for   Cellulitis of left lower extremity  Phlegmonous cellulitis     Present on Admission:  Diabetic foot infection (HCC)  Type 2 diabetes mellitus with hyperlipidemia (HCC)  Situational mixed anxiety and depressive disorder  Mild persistent asthma without complication  Hyperlipidemia with target LDL less than 100  Essential hypertension, benign  Cellulitis  AKI (acute kidney injury) (HCC)    Type 2 diabetes mellitus with hyperlipidemia (HCC)  - Order Sensitive  SSI    -  check TSH and HgA1C  - Hold by mouth medications  hold trulicity    Situational mixed anxiety and depressive disorder Continue Atarax  50 gm po tid prn  Mild persistent asthma without complication Continue albuterol  prn Continue trelegy  Hyperlipidemia with target LDL less than 100 Continue Crestor  10 mg po q day  Essential hypertension, benign Continue toprol  100 mg po q day and catapres  0.2 mg po bid  Cellulitis -admit per  cellulitis protocol will      Continue on  vancomycin and cefepime  CT showed:   no evidence of air  no evidence of osteomyelitis   no    foreign   objects There is phlegmon present  tetanus shot has been updated.      Will obtain MRSA screening,       obtain blood cultures  if febrile or septic     further antibiotic adjustment pending above results   AKI (acute kidney injury) (HCC) Obtain urine electrolytes Rehydrate  gently    Other plan as per orders.  DVT prophylaxis:  SCD      Code Status:    Code Status: Not on file FULL CODE as per patient   I had personally discussed CODE STATUS with patient   ACP   none   Family Communication:   Family not at  Bedside    Diet  Diet Orders (From admission, onward)     Start     Ordered   11/13/23 1910  Diet Carb Modified Fluid consistency: Thin; Room service appropriate? Yes  Diet effective now       Question Answer Comment  Diet-HS Snack? Nothing   Calorie Level Medium 1600-2000   Fluid consistency: Thin   Room service appropriate? Yes      11/13/23 1910            Disposition Plan:       To home once workup is complete and patient is stable   Following barriers for discharge:                                                        Electrolytes corrected                                                           Pain controlled with PO medications                               able to transition to PO antibiotics                                   Consult Orders  (From admission, onward)           Start     Ordered   11/13/23 1410  Consult to hospitalist  [facility] called for hospitalist for call back to [place] MD  Once       Provider:  (Not yet assigned)  Question Answer Comment  Place call to: [facility]   Reason for Consult Admit      11/13/23 1409            Consults called:   paged Dr. Sulema Endo with Orthopedics  Will see in AM , may drain at bedside  If evidence of infection may need surgical intervention later in the week in that case will have to transfer to [facility]   Admission status:  ED Disposition     ED Disposition  Admit   Condition  --   Comment  Hospital Area: [facility] [[id number]]  Level of Care: Med-Surg [16]  May admit patient to [facility] or [facility] if equivalent level of care is available:: Yes  Interfacility transfer: Yes  Covid Evaluation: Asymptomatic - no recent  exposure (last 10 days) testing not required  Diagnosis: Diabetic foot infection [facility]) [[id number]]  Admitting Physician: DEFAULT, PROVIDER [1]  Attending Physician: DEFAULT, PROVIDER [1]  Certification:: I certify there are rare and unusual circumstances requiring inpatient admission  Expected Medical Readiness: 11/15/2023           inpatient      I Expect 2 midnight stay secondary to severity of patient's current illness need for inpatient interventions justified by the following:     Severe lab/radiological/exam abnormalities including:    Cellulitis of left lower extremity     and extensive comorbidities including:  Chronic pain  DM2    CHF /asthma   That are currently affecting medical management.   I expect  patient to be hospitalized for 2 midnights requiring inpatient medical care.  Patient is at high risk for adverse outcome (such as loss of life or disability) if not treated.  Indication for inpatient stay as follows:   severe pain requiring acute inpatient management,     Need for IV antibiotics, IV fluids  IV pain medications,      Level of care     medical floor    Khyran Riera 11/13/2023, 8:25 PM    [facility]     after 2 AM please page floor coverage   If 7AM-7PM, please contact the day team taking care of the patient using [email or web address]

## 2023-11-13 NOTE — Assessment & Plan Note (Addendum)
 Continue albuterol  prn Continue trelegy

## 2023-11-13 NOTE — ED Notes (Signed)
 2nd attempt to call report. The nurse at [facility] remains indisposed.

## 2023-11-13 NOTE — Progress Notes (Signed)
 Physician paged via amnion ,will pass on to oncoming to look for orders to release, took pictures of patients wounds as well as elevated on pillow, reports level 4/10 for pain at this time

## 2023-11-13 NOTE — Assessment & Plan Note (Signed)
 Continue toprol  100 mg po q day and catapres  0.2 mg po bid

## 2023-11-13 NOTE — ED Triage Notes (Signed)
 Outpatient ultrasound showed possible abscess to left leg. Previous crush injury to leg previously.

## 2023-11-13 NOTE — Assessment & Plan Note (Signed)
-  Spoke about importance of quitting spent 5 minutes discussing options for treatment, prior attempts at quitting, and dangers of smoking ? -At this point patient is    interested in quitting ? - order nicotine patch  ? - nursing tobacco cessation protocol ? ?

## 2023-11-13 NOTE — Assessment & Plan Note (Signed)
 Continue Atarax  50 gm po tid prn

## 2023-11-13 NOTE — Assessment & Plan Note (Signed)
 Obtain urine electrolytes Rehydrate gently

## 2023-11-13 NOTE — Assessment & Plan Note (Signed)
-   Order Sensitive  SSI    -  check TSH and HgA1C  - Hold by mouth medications  hold trulicity

## 2023-11-13 NOTE — Assessment & Plan Note (Addendum)
-  admit per  cellulitis protocol will      Continue on  vancomycin and cefepime  CT showed:   no evidence of air  no evidence of osteomyelitis   no    foreign   objects There is phlegmon present       tetanus shot has been updated.      Will obtain MRSA screening,       obtain blood cultures  if febrile or septic     further antibiotic adjustment pending above results Early phlegmon - start on IV abx for tonight if no improvement can discuss with orthopedics in AM

## 2023-11-13 NOTE — ED Notes (Signed)
 I have just given report to Harvey, Charity fundraiser at [facility].

## 2023-11-14 DIAGNOSIS — S8012XA Contusion of left lower leg, initial encounter: Secondary | ICD-10-CM | POA: Diagnosis not present

## 2023-11-14 DIAGNOSIS — E1169 Type 2 diabetes mellitus with other specified complication: Secondary | ICD-10-CM | POA: Diagnosis not present

## 2023-11-14 DIAGNOSIS — J4530 Mild persistent asthma, uncomplicated: Secondary | ICD-10-CM | POA: Diagnosis not present

## 2023-11-14 DIAGNOSIS — Z6831 Body mass index (BMI) 31.0-31.9, adult: Secondary | ICD-10-CM | POA: Diagnosis not present

## 2023-11-14 DIAGNOSIS — F17210 Nicotine dependence, cigarettes, uncomplicated: Secondary | ICD-10-CM | POA: Diagnosis not present

## 2023-11-14 DIAGNOSIS — G8929 Other chronic pain: Secondary | ICD-10-CM | POA: Diagnosis not present

## 2023-11-14 DIAGNOSIS — M79605 Pain in left leg: Secondary | ICD-10-CM | POA: Diagnosis not present

## 2023-11-14 DIAGNOSIS — I110 Hypertensive heart disease with heart failure: Secondary | ICD-10-CM | POA: Diagnosis not present

## 2023-11-14 DIAGNOSIS — N179 Acute kidney failure, unspecified: Secondary | ICD-10-CM | POA: Diagnosis not present

## 2023-11-14 DIAGNOSIS — E785 Hyperlipidemia, unspecified: Secondary | ICD-10-CM | POA: Diagnosis not present

## 2023-11-14 DIAGNOSIS — L089 Local infection of the skin and subcutaneous tissue, unspecified: Secondary | ICD-10-CM | POA: Diagnosis not present

## 2023-11-14 DIAGNOSIS — E11628 Type 2 diabetes mellitus with other skin complications: Secondary | ICD-10-CM | POA: Diagnosis not present

## 2023-11-14 DIAGNOSIS — E1140 Type 2 diabetes mellitus with diabetic neuropathy, unspecified: Secondary | ICD-10-CM | POA: Diagnosis not present

## 2023-11-14 DIAGNOSIS — I5032 Chronic diastolic (congestive) heart failure: Secondary | ICD-10-CM | POA: Diagnosis not present

## 2023-11-14 DIAGNOSIS — L03116 Cellulitis of left lower limb: Secondary | ICD-10-CM | POA: Diagnosis not present

## 2023-11-14 LAB — COMPREHENSIVE METABOLIC PANEL WITH GFR
ALT: 22 U/L (ref 0–44)
AST: 21 U/L (ref 15–41)
Albumin: 2.7 g/dL — ABNORMAL LOW (ref 3.5–5.0)
Alkaline Phosphatase: 52 U/L (ref 38–126)
Anion gap: 5 (ref 5–15)
BUN: 16 mg/dL (ref 6–20)
CO2: 28 mmol/L (ref 22–32)
Calcium: 8.3 mg/dL — ABNORMAL LOW (ref 8.9–10.3)
Chloride: 108 mmol/L (ref 98–111)
Creatinine, Ser: 0.95 mg/dL (ref 0.44–1.00)
GFR, Estimated: >60 mL/min (ref >60)
Glucose, Bld: 98 mg/dL (ref 70–99)
Potassium: 3.6 mmol/L (ref 3.5–5.1)
Sodium: 141 mmol/L (ref 135–145)
Total Bilirubin: 0.6 mg/dL (ref 0.0–1.2)
Total Protein: 5.1 g/dL — ABNORMAL LOW (ref 6.5–8.1)

## 2023-11-14 LAB — MAGNESIUM: Magnesium: 1.9 mg/dL (ref 1.7–2.4)

## 2023-11-14 LAB — GLUCOSE, CAPILLARY
Glucose-Capillary: 86 mg/dL (ref 70–99)
Glucose-Capillary: 106 mg/dL — ABNORMAL HIGH (ref 70–99)
Glucose-Capillary: 96 mg/dL (ref 70–99)
Glucose-Capillary: 86 mg/dL (ref 70–99)

## 2023-11-14 LAB — CBC
HCT: 41.8 % (ref 36.0–46.0)
Hemoglobin: 12.7 g/dL (ref 12.0–15.0)
MCH: 27.2 pg (ref 26.0–34.0)
MCHC: 30.4 g/dL (ref 30.0–36.0)
MCV: 89.5 fL (ref 80.0–100.0)
Platelets: 221 10*3/uL (ref 150–400)
RBC: 4.67 MIL/uL (ref 3.87–5.11)
RDW: 15.1 % (ref 11.5–15.5)
WBC: 6.7 10*3/uL (ref 4.0–10.5)
nRBC: 0 % (ref 0.0–0.2)

## 2023-11-14 LAB — PHOSPHORUS: Phosphorus: 3.7 mg/dL (ref 2.5–4.6)

## 2023-11-14 LAB — HEMOGLOBIN A1C
Hgb A1c MFr Bld: 5.7 % — ABNORMAL HIGH (ref 4.8–5.6)
Mean Plasma Glucose: 116.89 mg/dL

## 2023-11-14 LAB — C-REACTIVE PROTEIN: CRP: <0.5 mg/dL (ref <1.0)

## 2023-11-14 MED ORDER — VENLAFAXINE HCL ER 37.5 MG PO CP24: 37.5000 mg | ORAL_CAPSULE | ORAL | Status: DC

## 2023-11-14 MED ORDER — NICOTINE 7 MG/24HR TD PT24: 7.0000 mg | MEDICATED_PATCH | TRANSDERMAL | 0 refills | Status: AC

## 2023-11-14 MED ORDER — NICOTINE 14 MG/24HR TD PT24: 14.0000 mg | MEDICATED_PATCH | Freq: Every day | TRANSDERMAL | 0 refills | Status: AC

## 2023-11-14 MED ORDER — SODIUM CHLORIDE 0.9 % IV SOLN
1.0000 g | Freq: Every day | INTRAVENOUS | Status: DC
  Administered 2023-11-14 – 2023-11-15 (×2): 1 g via INTRAVENOUS
  Filled 2023-11-14 (×2): qty 10

## 2023-11-14 NOTE — Plan of Care (Signed)
  Problem: Education: Goal: Knowledge of General Education information will improve Description: Including pain rating scale, medication(s)/side effects and non-pharmacologic comfort measures Outcome: Progressing   Problem: Activity: Goal: Risk for activity intolerance will decrease Outcome: Progressing   Problem: Nutrition: Goal: Adequate nutrition will be maintained Outcome: Progressing   Problem: Elimination: Goal: Will not experience complications related to bowel motility Outcome: Progressing   Problem: Pain Managment: Goal: General experience of comfort will improve and/or be controlled Outcome: Progressing   Problem: Safety: Goal: Ability to remain free from injury will improve Outcome: Progressing
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
## 2023-11-14 NOTE — TOC Initial Note (Signed)
 Transition of Care [facility]) - Initial/Assessment Note    Patient Details  Name: Melody Jenkins MRN: [id number] Date of Birth: [birth year]  Transition of Care [facility]) CM/SW Contact:    Amaryllis Junior, LCSW Phone Number: 11/14/2023, 2:23 PM  Clinical Narrative:                 Pt from home w/ family. Pt continues medical workup. Possible PT eval pending. TOC following for dc needs.     Barriers to Discharge: Continued Medical Work up   Patient Goals and CMS Choice Patient states their goals for this hospitalization and ongoing recovery are:: return home   Choice offered to / list presented to : NA      Expected Discharge Plan and Services In-house Referral: NA Discharge Planning Services: NA   Living arrangements for the past 2 months: Single Family Home                 DME Arranged: N/A DME Agency: NA       HH Arranged: NA HH Agency: NA        Prior Living Arrangements/Services Living arrangements for the past 2 months: Single Family Home Lives with:: Minor Children, Adult Children Patient language and need for interpreter reviewed:: Yes Do you feel safe going back to the place where you live?: Yes      Need for Family Participation in Patient Care: Yes (Comment) Care giver support system in place?: Yes (comment)   Criminal Activity/Legal Involvement Pertinent to Current Situation/Hospitalization: No - Comment as needed  Activities of Daily Living   ADL Screening (condition at time of admission) Independently performs ADLs?: Yes (appropriate for developmental age) Is the patient deaf or have difficulty hearing?: No Does the patient have difficulty seeing, even when wearing glasses/contacts?: No Does the patient have difficulty concentrating, remembering, or making decisions?: No  Permission Sought/Granted                  Emotional Assessment Appearance:: Appears stated age Attitude/Demeanor/Rapport: Engaged Affect (typically observed):  Accepting Orientation: : Oriented to Self, Oriented to Place, Oriented to  Time, Oriented to Situation Alcohol / Substance Use: Not Applicable Psych Involvement: No (comment)  Admission diagnosis:  Phlegmonous cellulitis [L02.91] Cellulitis of left lower extremity [L03.116] Diabetic foot infection (HCC) [M01.027, L08.9] Patient Active Problem List   Diagnosis Date Noted   Cellulitis 11/13/2023   AKI (acute kidney injury) (HCC) 11/13/2023   Chronic diastolic CHF (congestive heart failure) (HCC) 11/13/2023   Cystic acne vulgaris 04/29/2022   Hyperlipidemia LDL goal <100 04/29/2022   Cystic acne 03/23/2022   Mild persistent asthma without complication 10/04/2021   Hyperlipidemia with target LDL less than 100 04/05/2021   Diuretic-induced hypokalemia 12/27/2020   Encounter for general adult medical examination with abnormal findings 08/17/2020   Therapeutic drug monitoring 08/17/2020   Prolonged Q-T interval on ECG 08/17/2020   Rotator cuff arthropathy of right shoulder 04/20/2019   Arthritis of carpometacarpal (CMC) joint of left thumb 11/20/2018   Vitamin D  deficiency 11/19/2018   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 11/11/2018   Chronic back pain    Lumbar radiculopathy 06/07/2017   Carpal tunnel syndrome 04/09/2017   Type 2 diabetes mellitus with hyperlipidemia (HCC) 02/13/2017   Chronic constipation 11/20/2016   Lumbar spondylosis 06/19/2016   Inflammation of sacroiliac joint (HCC) 04/26/2016   Anterolisthesis 04/06/2016   Opioid dependence (HCC) 03/20/2016   Situational mixed anxiety and depressive disorder 02/26/2016  Insomnia 02/24/2016   Essential hypertension, benign 12/26/2015   DDD (degenerative disc disease), lumbar 12/26/2015   Tobacco abuse 04/06/2015   Obesity 04/06/2015   Asthma    PCP:  [facility]:   [id number] - [place], [place] - [address] AT [facility] OF [address] &  [address] [facility] [address] Phone: [phone] Fax: [phone]     Social Drivers of Health (SDOH) Social History: SDOH Screenings   Food Insecurity: No Food Insecurity (11/13/2023)  Housing: Low Risk  (11/13/2023)  Transportation Needs: No Transportation Needs (11/13/2023)  Utilities: Not At Risk (11/13/2023)  Alcohol Screen: Low Risk  (04/25/2022)  Depression (PHQ2-9): High Risk (11/08/2022)  Financial Resource Strain: Low Risk  (04/24/2021)  Physical Activity: Inactive (04/25/2022)  Social Connections: Socially Isolated (11/13/2023)  Stress: Stress Concern Present (04/25/2022)  Tobacco Use: High Risk (11/13/2023)   SDOH Interventions:     Readmission Risk Interventions    11/14/2023    2:21 PM  Readmission Risk Prevention Plan  Transportation Screening Complete  PCP or Specialist Appt within 5-7 Days Complete  Home Care Screening Complete  Medication Review (RN CM) Complete
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
## 2023-11-14 NOTE — Plan of Care (Signed)

## 2023-11-15 DIAGNOSIS — L0291 Cutaneous abscess, unspecified: Secondary | ICD-10-CM

## 2023-11-15 LAB — GLUCOSE, CAPILLARY
Glucose-Capillary: 81 mg/dL (ref 70–99)
Glucose-Capillary: 108 mg/dL — ABNORMAL HIGH (ref 70–99)

## 2023-11-15 MED ORDER — CEPHALEXIN 500 MG PO CAPS: 500.0000 mg | ORAL_CAPSULE | Freq: Four times a day (QID) | ORAL | 0 refills | Status: AC

## 2023-11-15 MED ORDER — DOXYCYCLINE HYCLATE 100 MG PO TABS: 100.0000 mg | ORAL_TABLET | Freq: Two times a day (BID) | ORAL | 0 refills | Status: AC

## 2023-11-15 MED ORDER — GABAPENTIN 400 MG PO CAPS: 400.0000 mg | ORAL_CAPSULE | Freq: Three times a day (TID) | ORAL | Status: AC

## 2023-11-15 NOTE — Progress Notes (Signed)
 Brief Orthopedic Note  Aspirate results from yesterday with no organisms seen on gram stain. No growth yet. No operative intervention recommended at this time. Recommend compressive wrap. Weight bearing as tolerated. Okay for diet and dvt ppx from ortho perspective. Will continue to follow aspirate results.   Diedra Fowler, MD Orthopedic Surgeon

## 2023-11-15 NOTE — Plan of Care (Signed)
  Problem: Education: Goal: Knowledge of General Education information will improve Description: Including pain rating scale, medication(s)/side effects and non-pharmacologic comfort measures Outcome: Completed/Met   Problem: Health Behavior/Discharge Planning: Goal: Ability to manage health-related needs will improve Outcome: Completed/Met   Problem: Clinical Measurements: Goal: Ability to maintain clinical measurements within normal limits will improve Outcome: Completed/Met Goal: Will remain free from infection Outcome: Completed/Met Goal: Diagnostic test results will improve Outcome: Completed/Met Goal: Respiratory complications will improve Outcome: Completed/Met Goal: Cardiovascular complication will be avoided Outcome: Completed/Met   Problem: Activity: Goal: Risk for activity intolerance will decrease Outcome: Completed/Met   Problem: Nutrition: Goal: Adequate nutrition will be maintained Outcome: Completed/Met   Problem: Coping: Goal: Level of anxiety will decrease Outcome: Completed/Met   Problem: Elimination: Goal: Will not experience complications related to bowel motility Outcome: Completed/Met Goal: Will not experience complications related to urinary retention Outcome: Completed/Met   Problem: Pain Managment: Goal: General experience of comfort will improve and/or be controlled Outcome: Completed/Met   Problem: Safety: Goal: Ability to remain free from injury will improve Outcome: Completed/Met   Problem: Skin Integrity: Goal: Risk for impaired skin integrity will decrease Outcome: Completed/Met   Problem: Clinical Measurements: Goal: Ability to avoid or minimize complications of infection will improve Outcome: Completed/Met   Problem: Skin Integrity: Goal: Skin integrity will improve Outcome: Completed/Met   Problem: Education: Goal: Ability to describe self-care measures that may prevent or decrease complications (Diabetes Survival Skills  Education) will improve Outcome: Completed/Met Goal: Individualized Educational Video(s) Outcome: Completed/Met   Problem: Coping: Goal: Ability to adjust to condition or change in health will improve Outcome: Completed/Met   Problem: Fluid Volume: Goal: Ability to maintain a balanced intake and output will improve Outcome: Completed/Met   Problem: Health Behavior/Discharge Planning: Goal: Ability to identify and utilize available resources and services will improve Outcome: Completed/Met Goal: Ability to manage health-related needs will improve Outcome: Completed/Met   Problem: Metabolic: Goal: Ability to maintain appropriate glucose levels will improve Outcome: Completed/Met   Problem: Nutritional: Goal: Maintenance of adequate nutrition will improve Outcome: Completed/Met Goal: Progress toward achieving an optimal weight will improve Outcome: Completed/Met   Problem: Skin Integrity: Goal: Risk for impaired skin integrity will decrease Outcome: Completed/Met   Problem: Tissue Perfusion: Goal: Adequacy of tissue perfusion will improve Outcome: Completed/Met

## 2023-11-15 NOTE — Plan of Care (Signed)
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
## 2023-11-17 LAB — BODY FLUID CULTURE W GRAM STAIN
Culture: NO GROWTH
Gram Stain: NONE SEEN

## 2023-11-18 LAB — CULTURE, BLOOD (ROUTINE X 2)
Culture: NO GROWTH
Special Requests: ADEQUATE

## 2023-12-11 ENCOUNTER — Other Ambulatory Visit: Payer: Self-pay | Admitting: Family Medicine

## 2023-12-11 DIAGNOSIS — Z1231 Encounter for screening mammogram for malignant neoplasm of breast: Secondary | ICD-10-CM

## 2024-01-01 ENCOUNTER — Ambulatory Visit
Admission: RE | Admit: 2024-01-01 | Discharge: 2024-01-01 | Disposition: A | Discharge status: Home / Self Care | Source: Ambulatory Visit | Attending: Family Medicine | Admitting: Family Medicine

## 2024-01-01 DIAGNOSIS — Z1231 Encounter for screening mammogram for malignant neoplasm of breast: Secondary | ICD-10-CM

## 2024-04-09 NOTE — Progress Notes (Signed)
 58 y.o. [id number] female here for annual exam. Single, boyfriend x 3 years. PCP: [facility]   Patient's last menstrual period was 12/17/2017.   She reports frequent urination. Mainly at night. Hard to hold urine. Pain and burning with IC.  1 UTI over the past year. Urine sample provided: Yes  Abnormal bleeding: None Pelvic discharge or pain: None Breast mass, nipple discharge or skin changes : None  Sexually active: Yes Birth control: none Last PAP:     Component Value Date/Time   DIAGPAP  01/28/2020 1053    - Negative for intraepithelial lesion or malignancy (NILM)   DIAGPAP  12/25/2018 0000    NEGATIVE FOR INTRAEPITHELIAL LESIONS OR MALIGNANCY.   DIAGPAP  12/25/2018 0000    FUNGAL ORGANISMS PRESENT CONSISTENT WITH CANDIDA SPP.   HPVHIGH Negative 01/28/2020 1053   ADEQPAP  01/28/2020 1053    Satisfactory for evaluation; transformation zone component PRESENT.   ADEQPAP  12/25/2018 0000    Satisfactory for evaluation  endocervical/transformation zone component ABSENT.   ADEQPAP (A) 08/29/2017 0000    Satisfactory for evaluation  endocervical/transformation zone component PRESENT.   Last mammogram: 01/01/24 BIRADS 1, density A Last colonoscopy: 11/30/20 q5y  Exercising: Not currently, getting back with silver sneakers Smoker: No  Flowsheet Row Office Visit from 04/14/2024 in [facility]  PHQ-2 Total Score 6    Flowsheet Row Clinical Support from 04/25/2022 in [facility]  PHQ-9 Total Score 14     GYN HISTORY: Atypical endometrial cells, 2019 > benign EMB and ECC  OB History  Gravida Para Term Preterm AB Living  5 1 1  4 1   SAB IAB Ectopic Multiple Live Births  3  1  1     # Outcome Date GA Lbr Len/2nd Weight Sex Type Anes PTL Lv  5 SAB           4 SAB           3 SAB           2 Term      CS-Unspec   LIV  1 Ectopic            Past Medical History:  Diagnosis Date   Anemia     Anxiety    Arthritis    Asthma    Carpal tunnel syndrome    Chronic back pain    Depression    Diabetes mellitus without complication (HCC)    Essential hypertension    Heart murmur    History of diabetes mellitus    History of multiple miscarriages    Hyperlipidemia    Torn rotator cuff    Past Surgical History:  Procedure Laterality Date   BREAST SURGERY  2000   reduction   CARPAL TUNNEL RELEASE     left   CESAREAN SECTION     COSMETIC SURGERY  2000   breast reduction   REDUCTION MAMMAPLASTY Bilateral 2000   ROTATOR CUFF REPAIR  10/2019   Current Outpatient Medications on File Prior to Visit  Medication Sig Dispense Refill   Ascorbic Acid (VITAMIN C) 1000 MG tablet Take 1,000 mg by mouth daily.     Blood Glucose Monitoring Suppl (BLOOD GLUCOSE SYSTEM PAK) KIT Please dispense based on patient and insurance preference. Use as directed to monitor FSBS 2x daily. Dx: E11.9 1 kit 1   Cholecalciferol (VITAMIN D3) 25 MCG (1000 UT) CAPS Take 1,000 Units by mouth daily.  cloNIDine  (CATAPRES ) 0.2 MG tablet TAKE 1 TABLET TWICE DAILY (Patient taking differently: Take 0.2 mg by mouth daily after breakfast.) 180 tablet 0   Cyanocobalamin (VITAMIN B 12 PO) Take 1 tablet by mouth daily.      cyclobenzaprine  (FLEXERIL ) 5 MG tablet Take 1 tablet (5 mg total) by mouth 3 (three) times daily as needed for muscle spasms. (Patient taking differently: Take 5 mg by mouth in the morning.) 270 tablet 0   desvenlafaxine  (PRISTIQ ) 25 MG 24 hr tablet Take 25 mg by mouth 2 (two) times a week.     Dulaglutide  (TRULICITY ) 0.75 MG/0.5ML SOPN INJECT 0.75MG  (1 PEN) UNDER THE SKIN EVERY WEEK (Patient taking differently: Inject 0.75 mg into the skin every 7 (seven) days.) 12 mL 0   gabapentin  (NEURONTIN ) 400 MG capsule Take 1 capsule (400 mg total) by mouth 3 (three) times daily.     Glucose Blood (BLOOD GLUCOSE TEST STRIPS) STRP Please dispense based on patient and insurance preference. Use as directed to  monitor FSBS 2x daily. Dx: E11.9 200 strip 1   hydrocortisone  2.5 % ointment Apply topically 2 (two) times daily. To affected areas on face (Patient taking differently: Apply 1 Application topically See admin instructions. Apply to affected areas of the face 2 times a day) 60 g 2   hydrOXYzine  (ATARAX ) 50 MG tablet Take 1 tablet (50 mg total) by mouth 3 (three) times daily. 270 tablet 0   indapamide  (LOZOL ) 1.25 MG tablet TAKE 1 TABLET(1.25 MG) BY MOUTH DAILY 90 tablet 0   Lancets MISC Please dispense based on patient and insurance preference. Use as directed to monitor FSBS 2x daily. Dx: E11.9 200 each 1   LINZESS  145 MCG CAPS capsule Take 145 mcg by mouth every other day.     meloxicam  (MOBIC ) 7.5 MG tablet TAKE 1 TABLET(7.5 MG) BY MOUTH DAILY 90 tablet 0   metoprolol  succinate (TOPROL -XL) 100 MG 24 hr tablet TAKE 1 TABLET (100 MG TOTAL) BY MOUTH DAILY. TAKE WITH OR IMMEDIATELY FOLLOWING A MEAL. 90 tablet 0   oxyCODONE -acetaminophen  (PERCOCET) 7.5-325 MG tablet Take 1 tablet by mouth 4 (four) times daily.     potassium chloride  (KLOR-CON ) 10 MEQ tablet TAKE 1 TABLET(10 MEQ) BY MOUTH THREE TIMES DAILY 270 tablet 0   rosuvastatin  (CRESTOR ) 10 MG tablet Take 1 tablet (10 mg total) by mouth daily. 90 tablet 1   TRELEGY ELLIPTA  200-62.5-25 MCG/ACT AEPB INHALE 1 PUFF INTO THE LUNGS DAILY (Patient taking differently: Inhale 1 puff into the lungs daily as needed (for seasonal flares).) 120 each 1   TYLENOL  8 HOUR ARTHRITIS PAIN 650 MG CR tablet Take 1,300 mg by mouth every 8 (eight) hours as needed (for unresolved pain).     mirtazapine  (REMERON ) 45 MG tablet Take 1 tablet (45 mg total) by mouth at bedtime. (Patient not taking: Reported on 04/14/2024) 90 tablet 0   No current facility-administered medications on file prior to visit.   Social History   Socioeconomic History   Marital status: Single    Spouse name: Not on file   Number of children: 1   Years of education: Not on file   Highest  education level: Not on file  Occupational History   Occupation: disablity  Tobacco Use   Smoking status: Every Day    Current packs/day: 0.50    Average packs/day: 0.5 packs/day for 30.0 years (15.0 ttl pk-yrs)    Types: Cigarettes   Smokeless tobacco: Never  Vaping Use   Vaping status:  Never Used  Substance and Sexual Activity   Alcohol use: No   Drug use: No   Sexual activity: Yes    Partners: Male    Birth control/protection: Post-menopausal, None  Other Topics Concern   Not on file  Social History Narrative   Not on file   Social Drivers of Health   Financial Resource Strain: Low Risk  (04/24/2021)   Overall Financial Resource Strain (CARDIA)    Difficulty of Paying Living Expenses: Not hard at all  Food Insecurity: No Food Insecurity (11/13/2023)   Hunger Vital Sign    Worried About Running Out of Food in the Last Year: Never true    Ran Out of Food in the Last Year: Never true  Transportation Needs: No Transportation Needs (11/13/2023)   PRAPARE - Administrator, Civil Service (Medical): No    Lack of Transportation (Non-Medical): No  Physical Activity: Inactive (04/25/2022)   Exercise Vital Sign    Days of Exercise per Week: 0 days    Minutes of Exercise per Session: 0 min  Stress: Stress Concern Present (04/25/2022)   [employer] of Occupational Health - Occupational Stress Questionnaire    Feeling of Stress : Very much  Social Connections: Socially Isolated (11/13/2023)   Social Connection and Isolation Panel    Frequency of Communication with Friends and Family: More than three times a week    Frequency of Social Gatherings with Friends and Family: Twice a week    Attends Religious Services: Never    Database Administrator or Organizations: No    Attends Banker Meetings: Never    Marital Status: Never married  Intimate Partner Violence: Not At Risk (11/13/2023)   Humiliation, Afraid, Rape, and Kick questionnaire    Fear of Current  or Ex-Partner: No    Emotionally Abused: No    Physically Abused: No    Sexually Abused: No   Family History  Problem Relation Age of Onset   Diabetes Mother    Hypertension Mother    Breast cancer Mother 3   Hypertension Father    Heart disease Father    Early death Brother    Kidney disease Maternal Grandmother    Heart disease Brother    Colon cancer Neg Hx    Rectal cancer Neg Hx    Esophageal cancer Neg Hx    Pancreatic cancer Neg Hx    Liver disease Neg Hx    Stomach cancer Neg Hx    Allergies  Allergen Reactions   Iodinated Contrast Media Shortness Of Breath, Nausea And Vomiting and Other (See Comments)    Could not breathe after being injected for a CT scan   Trazodone  And Nefazodone Hives   Tape Rash and Other (See Comments)    NO SURGICAL TAPE!!     PE Today's Vitals   04/14/24 1345  BP: 124/64  Pulse: 81  Temp: 98.1 F (36.7 C)  TempSrc: Oral  SpO2: 99%  Weight: 199 lb (90.3 kg)  Height: 5' 8 (1.727 m)   Body mass index is 30.26 kg/m.  Physical Exam Vitals reviewed. Exam conducted with a chaperone present.  Constitutional:      General: She is not in acute distress.    Appearance: Normal appearance.  HENT:     Head: Normocephalic and atraumatic.     Nose: Nose normal.  Eyes:     Extraocular Movements: Extraocular movements intact.     Conjunctiva/sclera: Conjunctivae normal.  Neck:  Thyroid : No thyroid  mass, thyromegaly or thyroid  tenderness.  Pulmonary:     Effort: Pulmonary effort is normal.  Chest:     Chest wall: No mass or tenderness.  Breasts:    Right: Normal. No swelling, mass, nipple discharge, skin change or tenderness.     Left: Normal. No swelling, mass, nipple discharge, skin change or tenderness.     Comments: Bilateral mammoplasty scars Abdominal:     General: There is no distension.     Palpations: Abdomen is soft.     Tenderness: There is no abdominal tenderness.  Genitourinary:    General: Normal vulva.      Exam position: Lithotomy position.     Urethra: No prolapse.     Vagina: Normal. No vaginal discharge or bleeding.     Cervix: Normal. No lesion.     Uterus: Normal. Not enlarged and not tender.      Adnexa: Right adnexa normal and left adnexa normal.  Musculoskeletal:        General: Normal range of motion.     Cervical back: Normal range of motion.  Lymphadenopathy:     Upper Body:     Right upper body: No axillary adenopathy.     Left upper body: No axillary adenopathy.     Lower Body: No right inguinal adenopathy. No left inguinal adenopathy.  Skin:    General: Skin is warm and dry.  Neurological:     General: No focal deficit present.     Mental Status: She is alert.  Psychiatric:        Mood and Affect: Mood normal.        Behavior: Behavior normal.       Assessment and Plan:        Well woman exam with routine gynecological exam Assessment & Plan: Cervical cancer screening performed according to ASCCP guidelines. Encouraged annual mammogram screening Colonoscopy UTD DXA never Labs and immunizations with her primary Encouraged safe sexual practices as indicated Encouraged healthy lifestyle practices with diet and exercise For patients under 50-70yo, I recommend 1200mg  calcium  daily and 600IU of vitamin D  daily.    Overactive bladder -     Urinalysis,Complete w/RFL Culture -     Ambulatory referral to Physical Therapy Start with behavioral therapy, consider medications if unimproved  Screening for depression  Genitourinary syndrome of menopause -     Estradiol; Apply 0.5g to vulva nightly for 2 weeks then 2 times a week.  Dispense: 42.5 g; Refill: 1   Vera LULLA Pa, MD

## 2024-04-10 ENCOUNTER — Other Ambulatory Visit: Payer: Self-pay

## 2024-04-10 DIAGNOSIS — I8393 Asymptomatic varicose veins of bilateral lower extremities: Secondary | ICD-10-CM

## 2024-04-14 ENCOUNTER — Encounter: Payer: Self-pay | Admitting: Obstetrics and Gynecology

## 2024-04-14 ENCOUNTER — Ambulatory Visit (INDEPENDENT_AMBULATORY_CARE_PROVIDER_SITE_OTHER): Admitting: Obstetrics and Gynecology

## 2024-04-14 VITALS — BP 124/64 | HR 81 | Temp 98.1°F | Ht 68.0 in | Wt 199.0 lb

## 2024-04-14 DIAGNOSIS — Z1331 Encounter for screening for depression: Secondary | ICD-10-CM | POA: Diagnosis not present

## 2024-04-14 DIAGNOSIS — N3281 Overactive bladder: Secondary | ICD-10-CM | POA: Diagnosis not present

## 2024-04-14 DIAGNOSIS — Z01419 Encounter for gynecological examination (general) (routine) without abnormal findings: Secondary | ICD-10-CM | POA: Diagnosis not present

## 2024-04-14 DIAGNOSIS — N958 Other specified menopausal and perimenopausal disorders: Secondary | ICD-10-CM

## 2024-04-14 MED ORDER — ESTRADIOL 0.01 % VA CREA: TOPICAL_CREAM | VAGINAL | 1 refills | Status: AC

## 2024-04-14 NOTE — Patient Instructions (Signed)
 For patients under 50-59yo, I recommend 1200mg  calcium  daily and 600IU of vitamin D daily. For patients over 59yo, I recommend 1200mg  calcium  daily and 800IU of vitamin D daily.  Health Maintenance, Female Adopting a healthy lifestyle and getting preventive care are important in promoting health and wellness. Ask your health care provider about: The right schedule for you to have regular tests and exams. Things you can do on your own to prevent diseases and keep yourself healthy. What should I know about diet, weight, and exercise? Eat a healthy diet  Eat a diet that includes plenty of vegetables, fruits, low-fat dairy products, and lean protein. Do not eat a lot of foods that are high in solid fats, added sugars, or sodium. Maintain a healthy weight Body mass index (BMI) is used to identify weight problems. It estimates body fat based on height and weight. Your health care provider can help determine your BMI and help you achieve or maintain a healthy weight. Get regular exercise Get regular exercise. This is one of the most important things you can do for your health. Most adults should: Exercise for at least 150 minutes each week. The exercise should increase your heart rate and make you sweat (moderate-intensity exercise). Do strengthening exercises at least twice a week. This is in addition to the moderate-intensity exercise. Spend less time sitting. Even light physical activity can be beneficial. Watch cholesterol and blood lipids Have your blood tested for lipids and cholesterol at 59 years of age, then have this test every 5 years. Have your cholesterol levels checked more often if: Your lipid or cholesterol levels are high. You are older than 59 years of age. You are at high risk for heart disease. What should I know about cancer screening? Depending on your health history and family history, you may need to have cancer screening at various ages. This may include screening  for: Breast cancer. Cervical cancer. Colorectal cancer. Skin cancer. Lung cancer. What should I know about heart disease, diabetes, and high blood pressure? Blood pressure and heart disease High blood pressure causes heart disease and increases the risk of stroke. This is more likely to develop in people who have high blood pressure readings or are overweight. Have your blood pressure checked: Every 3-5 years if you are 25-57 years of age. Every year if you are 24 years old or older. Diabetes Have regular diabetes screenings. This checks your fasting blood sugar level. Have the screening done: Once every three years after age 62 if you are at a normal weight and have a low risk for diabetes. More often and at a younger age if you are overweight or have a high risk for diabetes. What should I know about preventing infection? Hepatitis B If you have a higher risk for hepatitis B, you should be screened for this virus. Talk with your health care provider to find out if you are at risk for hepatitis B infection. Hepatitis C Testing is recommended for: Everyone born from [birth year] through 1966. Anyone with known risk factors for hepatitis C. Sexually transmitted infections (STIs) Get screened for STIs, including gonorrhea and chlamydia, if: You are sexually active and are younger than 59 years of age. You are older than 59 years of age and your health care provider tells you that you are at risk for this type of infection. Your sexual activity has changed since you were last screened, and you are at increased risk for chlamydia or gonorrhea. Ask your health care provider if  you are at risk. Ask your health care provider about whether you are at high risk for HIV. Your health care provider may recommend a prescription medicine to help prevent HIV infection. If you choose to take medicine to prevent HIV, you should first get tested for HIV. You should then be tested every 3 months for as long as you  are taking the medicine. Osteoporosis and menopause Osteoporosis is a disease in which the bones lose minerals and strength with aging. This can result in bone fractures. If you are 72 years old or older, or if you are at risk for osteoporosis and fractures, ask your health care provider if you should: Be screened for bone loss. Take a calcium  or vitamin D supplement to lower your risk of fractures. Be given hormone replacement therapy (HRT) to treat symptoms of menopause. Follow these instructions at home: Alcohol use Do not drink alcohol if: Your health care provider tells you not to drink. You are pregnant, may be pregnant, or are planning to become pregnant. If you drink alcohol: Limit how much you have to: 0-1 drink a day. Know how much alcohol is in your drink. In the [place] one drink equals one 12 oz bottle of beer (355 mL), one 5 oz glass of wine (148 mL), or one 1 oz glass of hard liquor (44 mL). Lifestyle Do not use any products that contain nicotine or tobacco. These products include cigarettes, chewing tobacco, and vaping devices, such as e-cigarettes. If you need help quitting, ask your health care provider. Do not use street drugs. Do not share needles. Ask your health care provider for help if you need support or information about quitting drugs. General instructions Schedule regular health, dental, and eye exams. Stay current with your vaccines. Tell your health care provider if: You often feel depressed. You have ever been abused or do not feel safe at home. Summary Adopting a healthy lifestyle and getting preventive care are important in promoting health and wellness. Follow your health care provider's instructions about healthy diet, exercising, and getting tested or screened for diseases. Follow your health care provider's instructions on monitoring your cholesterol and blood pressure. This information is not intended to replace advice given to you by your health  care provider. Make sure you discuss any questions you have with your health care provider. Document Revised: 10/17/2020 Document Reviewed: 10/17/2020 Elsevier Patient Education  2024 [employer].

## 2024-04-14 NOTE — Assessment & Plan Note (Signed)
 Cervical cancer screening performed according to ASCCP guidelines. Encouraged annual mammogram screening Colonoscopy UTD DXA never Labs and immunizations with her primary Encouraged safe sexual practices as indicated Encouraged healthy lifestyle practices with diet and exercise For patients under 50-59yo, I recommend 1200mg  calcium daily and 600IU of vitamin D daily.

## 2024-04-16 ENCOUNTER — Ambulatory Visit: Payer: Self-pay | Admitting: Obstetrics and Gynecology

## 2024-04-16 LAB — URINE CULTURE
MICRO NUMBER:: 17188206
SPECIMEN QUALITY:: ADEQUATE

## 2024-04-16 LAB — URINALYSIS, COMPLETE W/RFL CULTURE
Bilirubin Urine: NEGATIVE
Glucose, UA: NEGATIVE
Hyaline Cast: NONE SEEN /LPF
Ketones, ur: NEGATIVE
Nitrites, Initial: NEGATIVE
Protein, ur: NEGATIVE
Specific Gravity, Urine: 1.02 (ref 1.001–1.035)
pH: 7 (ref 5.0–8.0)

## 2024-04-16 LAB — CULTURE INDICATED

## 2024-05-19 ENCOUNTER — Ambulatory Visit (HOSPITAL_COMMUNITY)

## 2024-05-19 ENCOUNTER — Ambulatory Visit (HOSPITAL_COMMUNITY): Attending: Physician Assistant
